# Patient Record
Sex: Female | Born: 1950 | Race: White | Hispanic: No | Marital: Married | Smoking: Former smoker
Health system: Southern US, Community
[De-identification: ages and names within clinical notes are randomized; demographics above are authoritative.]

## PROBLEM LIST (undated history)

## (undated) DIAGNOSIS — K219 Gastro-esophageal reflux disease without esophagitis: Secondary | ICD-10-CM

## (undated) DIAGNOSIS — G4730 Sleep apnea, unspecified: Secondary | ICD-10-CM

## (undated) DIAGNOSIS — Z87440 Personal history of urinary (tract) infections: Secondary | ICD-10-CM

## (undated) DIAGNOSIS — T148XXA Other injury of unspecified body region, initial encounter: Secondary | ICD-10-CM

## (undated) DIAGNOSIS — M1990 Unspecified osteoarthritis, unspecified site: Secondary | ICD-10-CM

## (undated) DIAGNOSIS — F419 Anxiety disorder, unspecified: Secondary | ICD-10-CM

## (undated) DIAGNOSIS — M179 Osteoarthritis of knee, unspecified: Secondary | ICD-10-CM

## (undated) DIAGNOSIS — E785 Hyperlipidemia, unspecified: Secondary | ICD-10-CM

## (undated) DIAGNOSIS — H269 Unspecified cataract: Secondary | ICD-10-CM

## (undated) DIAGNOSIS — K819 Cholecystitis, unspecified: Secondary | ICD-10-CM

## (undated) DIAGNOSIS — T7840XA Allergy, unspecified, initial encounter: Secondary | ICD-10-CM

## (undated) DIAGNOSIS — R3915 Urgency of urination: Secondary | ICD-10-CM

## (undated) DIAGNOSIS — J189 Pneumonia, unspecified organism: Secondary | ICD-10-CM

## (undated) DIAGNOSIS — R011 Cardiac murmur, unspecified: Secondary | ICD-10-CM

## (undated) DIAGNOSIS — E119 Type 2 diabetes mellitus without complications: Secondary | ICD-10-CM

## (undated) DIAGNOSIS — J439 Emphysema, unspecified: Secondary | ICD-10-CM

## (undated) DIAGNOSIS — R519 Headache, unspecified: Secondary | ICD-10-CM

## (undated) DIAGNOSIS — M1710 Unilateral primary osteoarthritis, unspecified knee: Secondary | ICD-10-CM

## (undated) DIAGNOSIS — IMO0001 Reserved for inherently not codable concepts without codable children: Secondary | ICD-10-CM

## (undated) DIAGNOSIS — R51 Headache: Secondary | ICD-10-CM

## (undated) DIAGNOSIS — J449 Chronic obstructive pulmonary disease, unspecified: Secondary | ICD-10-CM

## (undated) HISTORY — DX: Emphysema, unspecified: J43.9

## (undated) HISTORY — DX: Unspecified cataract: H26.9

## (undated) HISTORY — PX: ABDOMINAL HYSTERECTOMY: SHX81

## (undated) HISTORY — DX: Cardiac murmur, unspecified: R01.1

## (undated) HISTORY — PX: TONSILLECTOMY: SUR1361

## (undated) HISTORY — DX: Allergy, unspecified, initial encounter: T78.40XA

## (undated) HISTORY — PX: COLONOSCOPY: SHX174

## (undated) HISTORY — PX: CARPAL TUNNEL RELEASE: SHX101

## (undated) HISTORY — PX: KNEE ARTHROSCOPY: SUR90

## (undated) HISTORY — DX: Hyperlipidemia, unspecified: E78.5

---

## 2009-07-29 DIAGNOSIS — E1169 Type 2 diabetes mellitus with other specified complication: Secondary | ICD-10-CM | POA: Insufficient documentation

## 2012-05-04 DIAGNOSIS — K219 Gastro-esophageal reflux disease without esophagitis: Secondary | ICD-10-CM | POA: Insufficient documentation

## 2014-07-01 DIAGNOSIS — K819 Cholecystitis, unspecified: Secondary | ICD-10-CM

## 2014-07-01 HISTORY — DX: Cholecystitis, unspecified: K81.9

## 2014-07-18 ENCOUNTER — Emergency Department (HOSPITAL_COMMUNITY): Payer: Managed Care, Other (non HMO)

## 2014-07-18 ENCOUNTER — Observation Stay (HOSPITAL_COMMUNITY)
Admission: EM | Admit: 2014-07-18 | Discharge: 2014-07-22 | Disposition: A | Discharge status: Home / Self Care | Payer: Managed Care, Other (non HMO) | Attending: General Surgery | Admitting: General Surgery

## 2014-07-18 DIAGNOSIS — E119 Type 2 diabetes mellitus without complications: Secondary | ICD-10-CM | POA: Diagnosis not present

## 2014-07-18 DIAGNOSIS — R0902 Hypoxemia: Secondary | ICD-10-CM

## 2014-07-18 DIAGNOSIS — E785 Hyperlipidemia, unspecified: Secondary | ICD-10-CM | POA: Insufficient documentation

## 2014-07-18 DIAGNOSIS — Z883 Allergy status to other anti-infective agents status: Secondary | ICD-10-CM | POA: Insufficient documentation

## 2014-07-18 DIAGNOSIS — J439 Emphysema, unspecified: Secondary | ICD-10-CM | POA: Insufficient documentation

## 2014-07-18 DIAGNOSIS — K219 Gastro-esophageal reflux disease without esophagitis: Secondary | ICD-10-CM | POA: Insufficient documentation

## 2014-07-18 DIAGNOSIS — Z881 Allergy status to other antibiotic agents status: Secondary | ICD-10-CM | POA: Diagnosis not present

## 2014-07-18 DIAGNOSIS — Z7982 Long term (current) use of aspirin: Secondary | ICD-10-CM | POA: Insufficient documentation

## 2014-07-18 DIAGNOSIS — R17 Unspecified jaundice: Secondary | ICD-10-CM

## 2014-07-18 DIAGNOSIS — R079 Chest pain, unspecified: Secondary | ICD-10-CM | POA: Diagnosis present

## 2014-07-18 DIAGNOSIS — Z79899 Other long term (current) drug therapy: Secondary | ICD-10-CM | POA: Diagnosis not present

## 2014-07-18 DIAGNOSIS — K810 Acute cholecystitis: Secondary | ICD-10-CM | POA: Diagnosis present

## 2014-07-18 HISTORY — DX: Anxiety disorder, unspecified: F41.9

## 2014-07-18 HISTORY — DX: Type 2 diabetes mellitus without complications: E11.9

## 2014-07-18 HISTORY — DX: Reserved for inherently not codable concepts without codable children: IMO0001

## 2014-07-18 HISTORY — DX: Unspecified osteoarthritis, unspecified site: M19.90

## 2014-07-18 HISTORY — DX: Pneumonia, unspecified organism: J18.9

## 2014-07-18 HISTORY — DX: Personal history of urinary (tract) infections: Z87.440

## 2014-07-18 HISTORY — DX: Cholecystitis, unspecified: K81.9

## 2014-07-18 HISTORY — DX: Urgency of urination: R39.15

## 2014-07-18 HISTORY — DX: Osteoarthritis of knee, unspecified: M17.9

## 2014-07-18 HISTORY — DX: Sleep apnea, unspecified: G47.30

## 2014-07-18 HISTORY — DX: Other injury of unspecified body region, initial encounter: T14.8XXA

## 2014-07-18 HISTORY — DX: Headache: R51

## 2014-07-18 HISTORY — DX: Unilateral primary osteoarthritis, unspecified knee: M17.10

## 2014-07-18 HISTORY — DX: Headache, unspecified: R51.9

## 2014-07-18 HISTORY — DX: Chronic obstructive pulmonary disease, unspecified: J44.9

## 2014-07-18 HISTORY — DX: Gastro-esophageal reflux disease without esophagitis: K21.9

## 2014-07-18 LAB — COMPREHENSIVE METABOLIC PANEL
ALK PHOS: 88 U/L (ref 39–117)
ALT: 60 U/L — ABNORMAL HIGH (ref 0–35)
ANION GAP: 8 (ref 5–15)
AST: 91 U/L — ABNORMAL HIGH (ref 0–37)
Albumin: 3.7 g/dL (ref 3.5–5.2)
BUN: 14 mg/dL (ref 6–23)
CALCIUM: 9.4 mg/dL (ref 8.4–10.5)
CO2: 25 mmol/L (ref 19–32)
Chloride: 105 mmol/L (ref 96–112)
Creatinine, Ser: 0.87 mg/dL (ref 0.50–1.10)
GFR calc non Af Amer: 69 mL/min — ABNORMAL LOW (ref >90)
GFR, EST AFRICAN AMERICAN: 80 mL/min — AB (ref >90)
Glucose, Bld: 146 mg/dL — ABNORMAL HIGH (ref 70–99)
Potassium: 4 mmol/L (ref 3.5–5.1)
Sodium: 138 mmol/L (ref 135–145)
Total Bilirubin: 1.5 mg/dL — ABNORMAL HIGH (ref 0.3–1.2)
Total Protein: 6.6 g/dL (ref 6.0–8.3)

## 2014-07-18 LAB — CBC
HCT: 40 % (ref 36.0–46.0)
Hemoglobin: 13.6 g/dL (ref 12.0–15.0)
MCH: 31.3 pg (ref 26.0–34.0)
MCHC: 34 g/dL (ref 30.0–36.0)
MCV: 92 fL (ref 78.0–100.0)
Platelets: 233 10*3/uL (ref 150–400)
RBC: 4.35 MIL/uL (ref 3.87–5.11)
RDW: 13.7 % (ref 11.5–15.5)
WBC: 8.9 10*3/uL (ref 4.0–10.5)

## 2014-07-18 LAB — LIPASE, BLOOD: Lipase: 31 U/L (ref 11–59)

## 2014-07-18 LAB — I-STAT TROPONIN, ED: Troponin i, poc: 0 ng/mL (ref 0.00–0.08)

## 2014-07-18 MED ORDER — SODIUM CHLORIDE 0.9 % IV BOLUS (SEPSIS)
500.0000 mL | Freq: Once | INTRAVENOUS | Status: AC
  Administered 2014-07-18: 500 mL via INTRAVENOUS

## 2014-07-18 MED ORDER — SODIUM CHLORIDE 0.9 % IV BOLUS (SEPSIS): 1000.0000 mL | Freq: Once | INTRAVENOUS | Status: DC

## 2014-07-18 MED ORDER — FENTANYL CITRATE 0.05 MG/ML IJ SOLN
50.0000 ug | Freq: Once | INTRAMUSCULAR | Status: AC
  Administered 2014-07-18: 50 ug via INTRAVENOUS
  Filled 2014-07-18: qty 2

## 2014-07-18 MED ORDER — PROMETHAZINE HCL 25 MG/ML IJ SOLN
12.5000 mg | Freq: Once | INTRAMUSCULAR | Status: AC
  Administered 2014-07-18: 12.5 mg via INTRAVENOUS
  Filled 2014-07-18: qty 1

## 2014-07-18 MED ORDER — FENTANYL CITRATE 0.05 MG/ML IJ SOLN
100.0000 ug | Freq: Once | INTRAMUSCULAR | Status: AC
  Administered 2014-07-18: 100 ug via INTRAVENOUS
  Filled 2014-07-18: qty 2

## 2014-07-18 MED ORDER — ONDANSETRON HCL 4 MG/2ML IJ SOLN
4.0000 mg | Freq: Once | INTRAMUSCULAR | Status: AC
  Administered 2014-07-18: 4 mg via INTRAVENOUS
  Filled 2014-07-18: qty 2

## 2014-07-18 NOTE — ED Notes (Signed)
Per EMS: Pt from home for sudden onset of chest tightness, pt reports increased sob and also nausea. EMS noted emesis x2 en route, given 4mg  of zofran en route. Unremarkable EKG. NAD pt axox 4.

## 2014-07-18 NOTE — ED Provider Notes (Signed)
CSN: [id number]     Arrival date & time 07/18/14  1941 History   First MD Initiated Contact with Patient 07/18/14 1951     Chief Complaint  Patient presents with  . Chest Pain   Lindsay Brady is a 64 y.o. female with a history of diabetes, acid reflux, emphysema, and hyperlipidemia who presents to the emergency department complaining of epigastric and right upper quadrant abdominal pain associated with nausea, and vomiting. She also reports bilateral low chest tightness. She reports onset of pain at 6 pm tonight that is intermittent and associated with lots of nausea and vomiting. Her port she has vomited approximately 8 times a day. She denies any hematemesis. She rates her pain at 4-10 for that is worse on her right side and describes as tightness. Patient's previous abdominal surgeries include a total hysterectomy. The patient is not currently on a PPI. She has chronic lower extremity edema that is not worse today. The patient denies fevers, chills, shortness of breath, palpitations, diarrhea, hematemesis, hematochezia, rashes, lightheadedness or dizziness. She denies personal history of MI. She denies personal or family history of DVTs or PEs. Contrary to the nursing notes, she denies shortness of breath to me.  (Consider location/radiation/quality/duration/timing/severity/associated sxs/prior Treatment) HPI  No past medical history on file. No past surgical history on file. No family history on file. History  Substance Use Topics  . Smoking status: Not on file  . Smokeless tobacco: Not on file  . Alcohol Use: Not on file   OB History    No data available     Review of Systems  Constitutional: Negative for fever and chills.  HENT: Negative for congestion and sore throat.   Eyes: Negative for visual disturbance.  Respiratory: Positive for chest tightness. Negative for cough, shortness of breath and wheezing.   Cardiovascular: Positive for chest pain. Negative for palpitations.   Gastrointestinal: Positive for nausea and vomiting. Negative for abdominal pain, diarrhea and blood in stool.  Genitourinary: Negative for dysuria, hematuria and difficulty urinating.  Musculoskeletal: Negative for back pain and neck pain.  Skin: Negative for rash.  Neurological: Negative for weakness, light-headedness and headaches.      Allergies  Erythromycin and Bactrim  Home Medications   Prior to Admission medications   Medication Sig Start Date End Date Taking? Authorizing Provider  Aclidinium Bromide 400 MCG/ACT AEPB Inhale 1 puff into the lungs 2 (two) times daily.    Yes Historical Provider, MD  albuterol (PROVENTIL HFA;VENTOLIN HFA) 108 (90 BASE) MCG/ACT inhaler Inhale 2 puffs into the lungs every 6 (six) hours as needed for wheezing or shortness of breath.   Yes Historical Provider, MD  aspirin EC 81 MG tablet Take 81 mg by mouth at bedtime.   Yes Historical Provider, MD  ASTRAGALUS PO Take 1 tablet by mouth every evening.   Yes Historical Provider, MD  Cholecalciferol (VITAMIN D) 2000 UNITS CAPS Take 2,000 Units by mouth every evening.   Yes Historical Provider, MD  Coenzyme Q10 (CO Q 10 PO) Take 1 tablet by mouth 2 (two) times daily.    Yes Historical Provider, MD  conjugated estrogens (PREMARIN) vaginal cream Place 1 Applicatorful vaginally every other day.   Yes Historical Provider, MD  CRANBERRY PO Take 1 tablet by mouth daily as needed (only when taking HCTZ).   Yes Historical Provider, MD  escitalopram (LEXAPRO) 10 MG tablet Take 10 mg by mouth at bedtime.   Yes Historical Provider, MD  Glucosamine-Chondroitin (MOVE FREE PO) Take 1 tablet  by mouth every evening.   Yes Historical Provider, MD  Multiple Vitamins-Minerals (CENTRUM PO) Take 1 tablet by mouth 2 (two) times daily. Chewable tablet   Yes Historical Provider, MD  Omega-3 Fatty Acids (FISH OIL TRIPLE STRENGTH) 1400 MG CAPS Take 2 capsules by mouth every evening.   Yes Historical Provider, MD  simvastatin  (ZOCOR) 10 MG tablet Take 10 mg by mouth every evening.   Yes Historical Provider, MD  triamterene-hydrochlorothiazide (MAXZIDE-25) 37.5-25 MG per tablet Take 1 tablet by mouth daily as needed (fluid).   Yes Historical Provider, MD   BP 160/56 mmHg  Pulse 66  Temp(Src) 98.3 F (36.8 C) (Oral)  Resp 13  SpO2 100% Physical Exam  Constitutional: She is oriented to person, place, and time. She appears well-developed and well-nourished. No distress.  HENT:  Head: Normocephalic and atraumatic.  Right Ear: External ear normal.  Left Ear: External ear normal.  Mouth/Throat: Oropharynx is clear and moist. No oropharyngeal exudate.  Eyes: Conjunctivae are normal. Pupils are equal, round, and reactive to light. Right eye exhibits no discharge. Left eye exhibits no discharge.  Neck: Neck supple. No JVD present. No tracheal deviation present.  Cardiovascular: Normal rate, regular rhythm, normal heart sounds and intact distal pulses.  Exam reveals no gallop and no friction rub.   No murmur heard. Bilateral radial, posterior tibialis and dorsalis pedis pulses are intact.   Pulmonary/Chest: Effort normal and breath sounds normal. No respiratory distress. She has no wheezes. She has no rales.  Abdominal: Soft. Bowel sounds are normal. She exhibits no distension and no mass. There is tenderness. There is no rebound and no guarding.  Abdomen is soft. RUQ and epigastric tenderness to palpation. Positive Murphy's sign.  Bowel sounds are present. No McBurney's point tenderness. . Negative Rovsing sign.   Musculoskeletal: She exhibits edema.  Mild bilateral lower extremity edema.   Lymphadenopathy:    She has no cervical adenopathy.  Neurological: She is alert and oriented to person, place, and time. Coordination normal.  Skin: Skin is warm and dry. No rash noted. She is not diaphoretic. No erythema. No pallor.  Psychiatric: She has a normal mood and affect. Her behavior is normal.  Nursing note and vitals  reviewed.   ED Course  Procedures (including critical care time) Labs Review Labs Reviewed  COMPREHENSIVE METABOLIC PANEL - Abnormal; Notable for the following:    Glucose, Bld 146 (*)    AST 91 (*)    ALT 60 (*)    Total Bilirubin 1.5 (*)    GFR calc non Af Amer 69 (*)    GFR calc Af Amer 80 (*)    All other components within normal limits  CBC  LIPASE, BLOOD  COMPREHENSIVE METABOLIC PANEL  CBC  I-STAT TROPOININ, ED    Imaging Review Dg Chest 2 View  07/18/2014   CLINICAL DATA:  Per EMS: Pt from home for sudden onset of right side chest tightness, pt reports increased sob and also nausea. Non-Smoker.  EXAM: CHEST  2 VIEW  COMPARISON:  None.  FINDINGS: Cardiac silhouette normal in size and configuration. No mediastinal or hilar masses or convincing adenopathy.  Mild bilateral interstitial thickening. There are additional reticular opacities in the upper lobes mostly on the right. This may all be chronic reflecting scarring. Mild interstitial edema should be considered. There is no airspace consolidation to suggest pneumonia. There is no pleural effusion or pneumothorax.  Bony thorax is demineralized but grossly intact.  IMPRESSION: 1. Mild interstitial thickening  most prominent in the right upper lobe. This may all be chronic. Mild interstitial edema should be considered. 2. No evidence of pneumonia.   Electronically Signed   By: Amie Portland M.D.   On: 07/18/2014 20:39   US Abdomen Complete  07/18/2014   CLINICAL DATA:  Right upper quadrant pain  EXAM: ULTRASOUND ABDOMEN COMPLETE  COMPARISON:  None.  FINDINGS: Gallbladder: Cholelithiasis. Distended gallbladder. No mural thickening or pericholecystic fluid. The patient was not tender over the gallbladder.  Common bile duct: Diameter: There is abnormal dilatation of the extrahepatic bile ducts, 1.2 cm  Liver: No focal lesion identified. Within normal limits in parenchymal echogenicity.  IVC: No abnormality visualized.  Pancreas: Not well  seen.  Spleen: Size and appearance within normal limits.  Right Kidney: Length: 11.4 cm. Echogenicity within normal limits. No mass or hydronephrosis visualized.  Left Kidney: Length: 13.2 cm. Echogenicity within normal limits. No mass or hydronephrosis visualized.  Abdominal aorta: No aneurysm visualized.  Other findings: None.  IMPRESSION: Cholelithiasis. Dilated extrahepatic bile ducts and distended gallbladder. Cannot exclude choledocholithiasis. Poor visibility of the pancreas.   Electronically Signed   By: Ellery Plunk M.D.   On: 07/18/2014 23:09     EKG Interpretation   Date/Time:  Thursday July 18 2014 20:02:07 EST Ventricular Rate:  59 PR Interval:  150 QRS Duration: 185 QT Interval:  480 QTC Calculation: 475 R Axis:   92 Text Interpretation:  Sinus rhythm Right bundle branch block Baseline  wander in lead(s) V3 No old tracing to compare Confirmed by [facility]  MD,  ELLIOTT [phone]) on 07/18/2014 8:43:47 PM      Filed Vitals:   07/18/14 2059 07/18/14 2128 07/18/14 2331 07/19/14 0014  BP:  108/60 115/49 160/56  Pulse:  69 70 66  Temp: 98.3 F (36.8 C)     TempSrc: Oral     Resp:  24 12 13   SpO2:  100% 92% 100%     MDM   Meds given in ED:  Medications  enoxaparin (LOVENOX) injection 40 mg (not administered)  dextrose 5 %-0.9 % sodium chloride infusion (not administered)  ciprofloxacin (CIPRO) IVPB 400 mg (400 mg Intravenous New Bag/Given 07/19/14 0059)  HYDROmorphone (DILAUDID) injection 1 mg (1 mg Intravenous Given 07/19/14 0109)  ondansetron (ZOFRAN) injection 4 mg (4 mg Intravenous Given 07/19/14 0109)  acetaminophen (TYLENOL) tablet 650 mg (not administered)    Or  acetaminophen (TYLENOL) suppository 650 mg (not administered)  fentaNYL (SUBLIMAZE) injection 50 mcg (50 mcg Intravenous Given 07/18/14 2048)  ondansetron (ZOFRAN) injection 4 mg (4 mg Intravenous Given 07/18/14 2048)  sodium chloride 0.9 % bolus 500 mL (0 mLs Intravenous Stopped 07/18/14 2140)  sodium  chloride 0.9 % bolus 500 mL (0 mLs Intravenous Stopped 07/19/14 0103)  promethazine (PHENERGAN) injection 12.5 mg (12.5 mg Intravenous Given 07/18/14 2207)  fentaNYL (SUBLIMAZE) injection 100 mcg (100 mcg Intravenous Given 07/18/14 2222)    New Prescriptions   No medications on file    Final diagnoses:  Acute cholecystitis   This is a 64 y.o. female with a history of diabetes, acid reflux, emphysema, and hyperlipidemia who presents to the emergency department complaining of epigastric and right upper quadrant abdominal pain associated with nausea, and vomiting.  Patient is afebrile and nontoxic appearing. Positive Murphy's sign. Troponin is negative. Elevated liver enzymes with an AST of 91 and ALT 60. Elevated bilirubin at 1.5. Lipase is normal at 31. CBC is unremarkable with a white count of 8.9. Abdominal ultrasound indicates cholelithiasis  with dilated extrahepatic bile ducts and distended gallbladder. Common bile duct size is 1.2 cm. She had some improvement with pain and nausea with fentanyl and phenergan. She is still complaining of pain and some slight nausea. [facility] The patient is in agreement with admission.   This patient was discussed with Dr. Effie ShyWentz who agrees with assessment and plan.      Lawana ChambersWilliam Duncan Deberah Adolf, PA-C 07/19/14 [id number]  Flint MelterElliott L Wentz, MD 07/19/14 [id number]

## 2014-07-19 ENCOUNTER — Encounter (HOSPITAL_COMMUNITY): Payer: Self-pay | Admitting: General Practice

## 2014-07-19 ENCOUNTER — Encounter (HOSPITAL_COMMUNITY)
Admission: EM | Disposition: A | Discharge status: Home / Self Care | Payer: Self-pay | Source: Home / Self Care | Attending: Emergency Medicine

## 2014-07-19 ENCOUNTER — Observation Stay (HOSPITAL_COMMUNITY): Payer: Managed Care, Other (non HMO) | Admitting: Certified Registered Nurse Anesthetist

## 2014-07-19 ENCOUNTER — Observation Stay (HOSPITAL_COMMUNITY): Payer: Managed Care, Other (non HMO)

## 2014-07-19 DIAGNOSIS — K810 Acute cholecystitis: Secondary | ICD-10-CM | POA: Diagnosis present

## 2014-07-19 HISTORY — PX: CHOLECYSTECTOMY: SHX55

## 2014-07-19 LAB — COMPREHENSIVE METABOLIC PANEL
ALK PHOS: 102 U/L (ref 39–117)
ALT: 189 U/L — ABNORMAL HIGH (ref 0–35)
AST: 256 U/L — AB (ref 0–37)
Albumin: 3.7 g/dL (ref 3.5–5.2)
Anion gap: 4 — ABNORMAL LOW (ref 5–15)
BUN: 13 mg/dL (ref 6–23)
CHLORIDE: 106 mmol/L (ref 96–112)
CO2: 27 mmol/L (ref 19–32)
CREATININE: 0.8 mg/dL (ref 0.50–1.10)
Calcium: 8.8 mg/dL (ref 8.4–10.5)
GFR calc Af Amer: 89 mL/min — ABNORMAL LOW (ref >90)
GFR calc non Af Amer: 77 mL/min — ABNORMAL LOW (ref >90)
Glucose, Bld: 180 mg/dL — ABNORMAL HIGH (ref 70–99)
POTASSIUM: 4.4 mmol/L (ref 3.5–5.1)
SODIUM: 137 mmol/L (ref 135–145)
Total Bilirubin: 2.3 mg/dL — ABNORMAL HIGH (ref 0.3–1.2)
Total Protein: 6.5 g/dL (ref 6.0–8.3)

## 2014-07-19 LAB — GLUCOSE, CAPILLARY: Glucose-Capillary: 138 mg/dL — ABNORMAL HIGH (ref 70–99)

## 2014-07-19 LAB — CBC
HCT: 40 % (ref 36.0–46.0)
Hemoglobin: 13.5 g/dL (ref 12.0–15.0)
MCH: 31.3 pg (ref 26.0–34.0)
MCHC: 33.8 g/dL (ref 30.0–36.0)
MCV: 92.8 fL (ref 78.0–100.0)
PLATELETS: 216 10*3/uL (ref 150–400)
RBC: 4.31 MIL/uL (ref 3.87–5.11)
RDW: 13.8 % (ref 11.5–15.5)
WBC: 9.5 10*3/uL (ref 4.0–10.5)

## 2014-07-19 LAB — SURGICAL PCR SCREEN
MRSA, PCR: NEGATIVE
Staphylococcus aureus: NEGATIVE

## 2014-07-19 SURGERY — LAPAROSCOPIC CHOLECYSTECTOMY WITH INTRAOPERATIVE CHOLANGIOGRAM
Anesthesia: General | Site: Abdomen

## 2014-07-19 MED ORDER — CIPROFLOXACIN IN D5W 400 MG/200ML IV SOLN
400.0000 mg | Freq: Two times a day (BID) | INTRAVENOUS | Status: DC
  Administered 2014-07-19 (×2): 400 mg via INTRAVENOUS
  Filled 2014-07-19 (×3): qty 200

## 2014-07-19 MED ORDER — SODIUM CHLORIDE 0.9 % IV SOLN
INTRAVENOUS | Status: DC | PRN
  Administered 2014-07-19: 15 mL

## 2014-07-19 MED ORDER — LIDOCAINE HCL 4 % MT SOLN
OROMUCOSAL | Status: DC | PRN
  Administered 2014-07-19: 4 mL via TOPICAL

## 2014-07-19 MED ORDER — DEXTROSE-NACL 5-0.9 % IV SOLN
INTRAVENOUS | Status: DC
  Administered 2014-07-19 – 2014-07-20 (×2): via INTRAVENOUS

## 2014-07-19 MED ORDER — MIDAZOLAM HCL 2 MG/2ML IJ SOLN
INTRAMUSCULAR | Status: AC
  Filled 2014-07-19: qty 2

## 2014-07-19 MED ORDER — ENOXAPARIN SODIUM 40 MG/0.4ML ~~LOC~~ SOLN
40.0000 mg | SUBCUTANEOUS | Status: DC
  Administered 2014-07-20 – 2014-07-21 (×2): 40 mg via SUBCUTANEOUS
  Filled 2014-07-19 (×2): qty 0.4

## 2014-07-19 MED ORDER — BUPIVACAINE-EPINEPHRINE (PF) 0.25% -1:200000 IJ SOLN
INTRAMUSCULAR | Status: AC
  Filled 2014-07-19: qty 30

## 2014-07-19 MED ORDER — DEXAMETHASONE SODIUM PHOSPHATE 4 MG/ML IJ SOLN
INTRAMUSCULAR | Status: AC
  Filled 2014-07-19: qty 1

## 2014-07-19 MED ORDER — OXYCODONE-ACETAMINOPHEN 5-325 MG PO TABS
1.0000 | ORAL_TABLET | ORAL | Status: DC | PRN
  Administered 2014-07-20 – 2014-07-21 (×4): 2 via ORAL
  Filled 2014-07-19 (×4): qty 2

## 2014-07-19 MED ORDER — ROCURONIUM BROMIDE 50 MG/5ML IV SOLN
INTRAVENOUS | Status: AC
  Filled 2014-07-19: qty 1

## 2014-07-19 MED ORDER — GLYCOPYRROLATE 0.2 MG/ML IJ SOLN
INTRAMUSCULAR | Status: AC
  Filled 2014-07-19: qty 2

## 2014-07-19 MED ORDER — PHENYLEPHRINE HCL 10 MG/ML IJ SOLN
INTRAMUSCULAR | Status: DC | PRN
Start: 2014-07-19 — End: 2014-07-19
  Administered 2014-07-19 (×3): 80 ug via INTRAVENOUS

## 2014-07-19 MED ORDER — BUPIVACAINE-EPINEPHRINE 0.25% -1:200000 IJ SOLN
INTRAMUSCULAR | Status: DC | PRN
  Administered 2014-07-19: 15 mL

## 2014-07-19 MED ORDER — 0.9 % SODIUM CHLORIDE (POUR BTL) OPTIME
TOPICAL | Status: DC | PRN
  Administered 2014-07-19: 1000 mL

## 2014-07-19 MED ORDER — FENTANYL CITRATE 0.05 MG/ML IJ SOLN
INTRAMUSCULAR | Status: AC
  Filled 2014-07-19: qty 2

## 2014-07-19 MED ORDER — ACETAMINOPHEN 325 MG PO TABS: 325.0000 mg | ORAL_TABLET | ORAL | Status: DC | PRN

## 2014-07-19 MED ORDER — EPHEDRINE SULFATE 50 MG/ML IJ SOLN
INTRAMUSCULAR | Status: DC | PRN
  Administered 2014-07-19: 5 mg via INTRAVENOUS

## 2014-07-19 MED ORDER — LIDOCAINE HCL (CARDIAC) 20 MG/ML IV SOLN
INTRAVENOUS | Status: DC | PRN
  Administered 2014-07-19: 80 mg via INTRAVENOUS

## 2014-07-19 MED ORDER — ONDANSETRON HCL 4 MG/2ML IJ SOLN
INTRAMUSCULAR | Status: DC | PRN
  Administered 2014-07-19: 4 mg via INTRAVENOUS

## 2014-07-19 MED ORDER — ONDANSETRON HCL 4 MG/2ML IJ SOLN
4.0000 mg | Freq: Four times a day (QID) | INTRAMUSCULAR | Status: DC | PRN
  Administered 2014-07-19: 4 mg via INTRAVENOUS
  Filled 2014-07-19: qty 2

## 2014-07-19 MED ORDER — ENOXAPARIN SODIUM 40 MG/0.4ML ~~LOC~~ SOLN: 40.0000 mg | SUBCUTANEOUS | Status: DC

## 2014-07-19 MED ORDER — GLYCOPYRROLATE 0.2 MG/ML IJ SOLN
INTRAMUSCULAR | Status: DC | PRN
  Administered 2014-07-19: .1 mg via INTRAVENOUS
  Administered 2014-07-19: .4 mg via INTRAVENOUS

## 2014-07-19 MED ORDER — NEOSTIGMINE METHYLSULFATE 10 MG/10ML IV SOLN
INTRAVENOUS | Status: DC | PRN
  Administered 2014-07-19: 3 mg via INTRAVENOUS
  Administered 2014-07-19: 1 mg via INTRAVENOUS

## 2014-07-19 MED ORDER — PROPOFOL 10 MG/ML IV BOLUS
INTRAVENOUS | Status: AC
  Filled 2014-07-19: qty 20

## 2014-07-19 MED ORDER — ALBUTEROL SULFATE HFA 108 (90 BASE) MCG/ACT IN AERS
INHALATION_SPRAY | RESPIRATORY_TRACT | Status: DC | PRN
  Administered 2014-07-19: 8 via RESPIRATORY_TRACT

## 2014-07-19 MED ORDER — PHENYLEPHRINE 40 MCG/ML (10ML) SYRINGE FOR IV PUSH (FOR BLOOD PRESSURE SUPPORT)
PREFILLED_SYRINGE | INTRAVENOUS | Status: AC
  Filled 2014-07-19: qty 20

## 2014-07-19 MED ORDER — ACETAMINOPHEN 650 MG RE SUPP
650.0000 mg | Freq: Four times a day (QID) | RECTAL | Status: DC | PRN
Start: 2014-07-19 — End: 2014-07-22

## 2014-07-19 MED ORDER — LACTATED RINGERS IV SOLN
INTRAVENOUS | Status: DC
  Administered 2014-07-19: 15:00:00 via INTRAVENOUS

## 2014-07-19 MED ORDER — ACETAMINOPHEN 325 MG PO TABS: 650.0000 mg | ORAL_TABLET | Freq: Four times a day (QID) | ORAL | Status: DC | PRN

## 2014-07-19 MED ORDER — PROPOFOL 10 MG/ML IV BOLUS
INTRAVENOUS | Status: DC | PRN
  Administered 2014-07-19: 160 mg via INTRAVENOUS
  Administered 2014-07-19: 20 mg via INTRAVENOUS

## 2014-07-19 MED ORDER — OXYCODONE HCL 5 MG PO TABS: 5.0000 mg | ORAL_TABLET | Freq: Once | ORAL | Status: DC | PRN

## 2014-07-19 MED ORDER — FENTANYL CITRATE 0.05 MG/ML IJ SOLN
25.0000 ug | INTRAMUSCULAR | Status: DC | PRN
  Administered 2014-07-19 (×2): 50 ug via INTRAVENOUS

## 2014-07-19 MED ORDER — FENTANYL CITRATE 0.05 MG/ML IJ SOLN
INTRAMUSCULAR | Status: AC
  Filled 2014-07-19: qty 5

## 2014-07-19 MED ORDER — SODIUM CHLORIDE 0.9 % IR SOLN
Status: DC | PRN
  Administered 2014-07-19 (×2): 1000 mL

## 2014-07-19 MED ORDER — HYDROMORPHONE HCL 1 MG/ML IJ SOLN
1.0000 mg | INTRAMUSCULAR | Status: DC | PRN
  Administered 2014-07-19 – 2014-07-20 (×2): 1 mg via INTRAVENOUS
  Filled 2014-07-19 (×2): qty 1

## 2014-07-19 MED ORDER — ACETAMINOPHEN 160 MG/5ML PO SOLN
325.0000 mg | ORAL | Status: DC | PRN
  Filled 2014-07-19: qty 20.3

## 2014-07-19 MED ORDER — SUCCINYLCHOLINE CHLORIDE 20 MG/ML IJ SOLN
INTRAMUSCULAR | Status: DC | PRN
  Administered 2014-07-19: 60 mg via INTRAVENOUS

## 2014-07-19 MED ORDER — MIDAZOLAM HCL 2 MG/2ML IJ SOLN
2.0000 mg | Freq: Once | INTRAMUSCULAR | Status: AC
  Administered 2014-07-19: 2 mg via INTRAVENOUS

## 2014-07-19 MED ORDER — LACTATED RINGERS IV SOLN
INTRAVENOUS | Status: DC | PRN
  Administered 2014-07-19 (×2): via INTRAVENOUS

## 2014-07-19 MED ORDER — FENTANYL CITRATE 0.05 MG/ML IJ SOLN
INTRAMUSCULAR | Status: DC | PRN
  Administered 2014-07-19 (×3): 25 ug via INTRAVENOUS
  Administered 2014-07-19: 150 ug via INTRAVENOUS
  Administered 2014-07-19 (×3): 25 ug via INTRAVENOUS

## 2014-07-19 MED ORDER — LIDOCAINE HCL (CARDIAC) 20 MG/ML IV SOLN
INTRAVENOUS | Status: AC
  Filled 2014-07-19: qty 10

## 2014-07-19 MED ORDER — DEXAMETHASONE SODIUM PHOSPHATE 4 MG/ML IJ SOLN
INTRAMUSCULAR | Status: DC | PRN
  Administered 2014-07-19: 4 mg via INTRAVENOUS

## 2014-07-19 MED ORDER — MIDAZOLAM HCL 5 MG/5ML IJ SOLN
INTRAMUSCULAR | Status: DC | PRN
  Administered 2014-07-19: 2 mg via INTRAVENOUS

## 2014-07-19 MED ORDER — OXYCODONE HCL 5 MG/5ML PO SOLN: 5.0000 mg | Freq: Once | ORAL | Status: DC | PRN

## 2014-07-19 MED ORDER — ROCURONIUM BROMIDE 100 MG/10ML IV SOLN
INTRAVENOUS | Status: DC | PRN
Start: 2014-07-19 — End: 2014-07-19
  Administered 2014-07-19: 30 mg via INTRAVENOUS

## 2014-07-19 SURGICAL SUPPLY — 41 items
APPLIER CLIP 5 13 M/L LIGAMAX5 (MISCELLANEOUS) ×2
CANISTER SUCTION 2500CC (MISCELLANEOUS) ×2 IMPLANT
CHLORAPREP W/TINT 26ML (MISCELLANEOUS) ×2 IMPLANT
CLIP APPLIE 5 13 M/L LIGAMAX5 (MISCELLANEOUS) ×1 IMPLANT
COVER MAYO STAND STRL (DRAPES) ×2 IMPLANT
COVER SURGICAL LIGHT HANDLE (MISCELLANEOUS) ×2 IMPLANT
DRAPE C-ARM 42X72 X-RAY (DRAPES) ×2 IMPLANT
DRAPE LAPAROSCOPIC ABDOMINAL (DRAPES) ×2 IMPLANT
ELECT REM PT RETURN 9FT ADLT (ELECTROSURGICAL) ×2
ELECTRODE REM PT RTRN 9FT ADLT (ELECTROSURGICAL) ×1 IMPLANT
FILTER SMOKE EVAC LAPAROSHD (FILTER) IMPLANT
GLOVE BIO SURGEON STRL SZ7.5 (GLOVE) ×2 IMPLANT
GLOVE BIO SURGEON STRL SZ8 (GLOVE) ×2 IMPLANT
GLOVE BIOGEL PI IND STRL 7.5 (GLOVE) ×2 IMPLANT
GLOVE BIOGEL PI IND STRL 8 (GLOVE) ×1 IMPLANT
GLOVE BIOGEL PI INDICATOR 7.5 (GLOVE) ×2
GLOVE BIOGEL PI INDICATOR 8 (GLOVE) ×1
GOWN STRL REUS W/ TWL LRG LVL3 (GOWN DISPOSABLE) ×2 IMPLANT
GOWN STRL REUS W/ TWL XL LVL3 (GOWN DISPOSABLE) ×1 IMPLANT
GOWN STRL REUS W/TWL LRG LVL3 (GOWN DISPOSABLE) ×2
GOWN STRL REUS W/TWL XL LVL3 (GOWN DISPOSABLE) ×1
KIT BASIN OR (CUSTOM PROCEDURE TRAY) ×2 IMPLANT
KIT ROOM TURNOVER OR (KITS) ×2 IMPLANT
LIQUID BAND (GAUZE/BANDAGES/DRESSINGS) ×2 IMPLANT
NS IRRIG 1000ML POUR BTL (IV SOLUTION) ×2 IMPLANT
PAD ARMBOARD 7.5X6 YLW CONV (MISCELLANEOUS) ×2 IMPLANT
POUCH RETRIEVAL ECOSAC 10 (ENDOMECHANICALS) ×1 IMPLANT
POUCH RETRIEVAL ECOSAC 10MM (ENDOMECHANICALS) ×1
SCISSORS LAP 5X35 DISP (ENDOMECHANICALS) ×2 IMPLANT
SET CHOLANGIOGRAPH 5 50 .035 (SET/KITS/TRAYS/PACK) ×2 IMPLANT
SET IRRIG TUBING LAPAROSCOPIC (IRRIGATION / IRRIGATOR) ×2 IMPLANT
SLEEVE ENDOPATH XCEL 5M (ENDOMECHANICALS) ×4 IMPLANT
SPECIMEN JAR SMALL (MISCELLANEOUS) ×2 IMPLANT
SUT VIC AB 4-0 PS2 27 (SUTURE) ×2 IMPLANT
SUT VICRYL 0 UR6 27IN ABS (SUTURE) ×4 IMPLANT
TOWEL OR 17X24 6PK STRL BLUE (TOWEL DISPOSABLE) ×2 IMPLANT
TOWEL OR 17X26 10 PK STRL BLUE (TOWEL DISPOSABLE) ×2 IMPLANT
TRAY LAPAROSCOPIC (CUSTOM PROCEDURE TRAY) ×2 IMPLANT
TROCAR XCEL BLUNT TIP 100MML (ENDOMECHANICALS) ×2 IMPLANT
TROCAR XCEL NON-BLD 5MMX100MML (ENDOMECHANICALS) ×2 IMPLANT
TUBING INSUFFLATION (TUBING) ×2 IMPLANT

## 2014-07-19 NOTE — Transfer of Care (Signed)
Immediate Anesthesia Transfer of Care Note  Patient: Lindsay Brady  Procedure(s) Performed: Procedure(s): LAPAROSCOPIC CHOLECYSTECTOMY WITH INTRAOPERATIVE CHOLANGIOGRAM (N/A)  Patient Location: PACU  Anesthesia Type:General  Level of Consciousness: awake, alert  and oriented  Airway & Oxygen Therapy: Patient Spontanous Breathing and Patient connected to nasal cannula oxygen  Post-op Assessment: Report given to RN and Post -op Vital signs reviewed and stable  Post vital signs: Reviewed and stable  Last Vitals:  Filed Vitals:   07/19/14 1246  BP: 95/40  Pulse: 87  Temp: 38.1 C  Resp: 18    Complications: No apparent anesthesia complications

## 2014-07-19 NOTE — Anesthesia Preprocedure Evaluation (Addendum)
Anesthesia Evaluation  Patient identified by MRN, date of birth, ID band Patient awake    Reviewed: Allergy & Precautions, NPO status , Patient's Chart, lab work & pertinent test results  History of Anesthesia Complications Negative for: history of anesthetic complications  Airway Mallampati: II  TM Distance: >3 FB Neck ROM: Full    Dental  (+) Teeth Intact, Dental Advisory Given   Pulmonary shortness of breath, sleep apnea , COPD COPD inhaler, former smoker,  breath sounds clear to auscultation        Cardiovascular negative cardio ROS  Rhythm:Regular     Neuro/Psych  Headaches, Anxiety    GI/Hepatic Neg liver ROS, GERD-  Medicated and Controlled,  Endo/Other  diabetes, Type 2, Oral Hypoglycemic Agents  Renal/GU negative Renal ROS     Musculoskeletal  (+) Arthritis -,   Abdominal   Peds  Hematology negative hematology ROS (+)   Anesthesia Other Findings   Reproductive/Obstetrics                            Anesthesia Physical Anesthesia Plan  ASA: III  Anesthesia Plan: General   Post-op Pain Management:    Induction: Intravenous  Airway Management Planned: Oral ETT  Additional Equipment: None  Intra-op Plan:   Post-operative Plan: Extubation in OR  Informed Consent: I have reviewed the patients History and Physical, chart, labs and discussed the procedure including the risks, benefits and alternatives for the proposed anesthesia with the patient or authorized representative who has indicated his/her understanding and acceptance.   Dental advisory given  Plan Discussed with: CRNA, Anesthesiologist and Surgeon  Anesthesia Plan Comments:        Anesthesia Quick Evaluation

## 2014-07-19 NOTE — Progress Notes (Signed)
Patient ID: Lindsay Brady, female   DOB: [birth year], 64 y.o.   MRN: [id number]    Subjective: Pt doing well this morning.  Pain improved  Objective: Vital signs in last 24 hours: Temp:  [98.3 F (36.8 C)-99 F (37.2 C)] 99 F (37.2 C) (02/19 0405) Pulse Rate:  [62-88] 88 (02/19 0405) Resp:  [12-31] 24 (02/19 0405) BP: (98-160)/(49-78) 127/61 mmHg (02/19 0405) SpO2:  [89 %-100 %] 98 % (02/19 0405) Weight:  [283 lb 15.2 oz (128.8 kg)] 283 lb 15.2 oz (128.8 kg) (02/19 0405) Last BM Date: 07/18/14  Intake/Output from previous day:   Intake/Output this shift:    PE: Abd: soft, tender in RUQ, +BS, obese Heart: regular Lungs: CTAB  Lab Results:   Recent Labs  07/18/14 1950 07/19/14 0237  WBC 8.9 9.5  HGB 13.6 13.5  HCT 40.0 40.0  PLT 233 216   BMET  Recent Labs  07/18/14 2035 07/19/14 0237  NA 138 137  K 4.0 4.4  CL 105 106  CO2 25 27  GLUCOSE 146* 180*  BUN 14 13  CREATININE 0.87 0.80  CALCIUM 9.4 8.8   PT/INR No results for input(s): LABPROT, INR in the last 72 hours. CMP     Component Value Date/Time   NA 137 07/19/2014 0237   K 4.4 07/19/2014 0237   CL 106 07/19/2014 0237   CO2 27 07/19/2014 0237   GLUCOSE 180* 07/19/2014 0237   BUN 13 07/19/2014 0237   CREATININE 0.80 07/19/2014 0237   CALCIUM 8.8 07/19/2014 0237   PROT 6.5 07/19/2014 0237   ALBUMIN 3.7 07/19/2014 0237   AST 256* 07/19/2014 0237   ALT 189* 07/19/2014 0237   ALKPHOS 102 07/19/2014 0237   BILITOT 2.3* 07/19/2014 0237   GFRNONAA 77* 07/19/2014 0237   GFRAA 89* 07/19/2014 0237   Lipase     Component Value Date/Time   LIPASE 31 07/18/2014 2035       Studies/Results: Dg Chest 2 View  07/18/2014   CLINICAL DATA:  Per EMS: Pt from home for sudden onset of right side chest tightness, pt reports increased sob and also nausea. Non-Smoker.  EXAM: CHEST  2 VIEW  COMPARISON:  None.  FINDINGS: Cardiac silhouette normal in size and configuration. No mediastinal or hilar masses or  convincing adenopathy.  Mild bilateral interstitial thickening. There are additional reticular opacities in the upper lobes mostly on the right. This may all be chronic reflecting scarring. Mild interstitial edema should be considered. There is no airspace consolidation to suggest pneumonia. There is no pleural effusion or pneumothorax.  Bony thorax is demineralized but grossly intact.  IMPRESSION: 1. Mild interstitial thickening most prominent in the right upper lobe. This may all be chronic. Mild interstitial edema should be considered. 2. No evidence of pneumonia.   Electronically Signed   By: Amie Portland M.D.   On: 07/18/2014 20:39   US Abdomen Complete  07/18/2014   CLINICAL DATA:  Right upper quadrant pain  EXAM: ULTRASOUND ABDOMEN COMPLETE  COMPARISON:  None.  FINDINGS: Gallbladder: Cholelithiasis. Distended gallbladder. No mural thickening or pericholecystic fluid. The patient was not tender over the gallbladder.  Common bile duct: Diameter: There is abnormal dilatation of the extrahepatic bile ducts, 1.2 cm  Liver: No focal lesion identified. Within normal limits in parenchymal echogenicity.  IVC: No abnormality visualized.  Pancreas: Not well seen.  Spleen: Size and appearance within normal limits.  Right Kidney: Length: 11.4 cm. Echogenicity within normal limits. No mass or hydronephrosis visualized.  Left Kidney: Length: 13.2 cm. Echogenicity within normal limits. No mass or hydronephrosis visualized.  Abdominal aorta: No aneurysm visualized.  Other findings: None.  IMPRESSION: Cholelithiasis. Dilated extrahepatic bile ducts and distended gallbladder. Cannot exclude choledocholithiasis. Poor visibility of the pancreas.   Electronically Signed   By: Ellery Plunkaniel R Mitchell M.D.   On: 07/18/2014 23:09    Anti-infectives: Anti-infectives    Start     Dose/Rate Route Frequency Ordered Stop   07/19/14 0030  ciprofloxacin (CIPRO) IVPB 400 mg     400 mg 200 mL/hr over 60 Minutes Intravenous Every 12  hours 07/19/14 0025         Assessment/Plan  1. Acute cholecystitis  2. Elevated LFTs, possible choledocholithiasis  Plan: 1. LFTs increased today.  Will still proceed with lap chole today with IOC.  If positive will call GI for ERCP.  Patient understands this and is agreeable to proceed.     Caedan Sumler E 07/19/2014, 8:58 AM Pager: [phone]

## 2014-07-19 NOTE — H&P (Signed)
Lindsay Brady is an 64 y.o. female.   Chief Complaint: RUQ abdominal pain HPI: pt presents to ED with sharp severe RUQ abdominal pain after eating at 5 pm. Pain is sharp,  Constant and located in RUQ.  Radiates into lower chest.  US shows gallstones distended GB and 1CM CBD. Pt having sever NV.   No past medical history on file.  No past surgical history on file.  No family history on file. Social History:  has no tobacco, alcohol, and drug history on file.  Allergies:  Allergies  Allergen Reactions  . Erythromycin Other (See Comments)    Pain in stomach   . Bactrim [Sulfamethoxazole-Trimethoprim] Rash     (Not in a hospital admission)  Results for orders placed or performed during the hospital encounter of 07/18/14 (from the past 48 hour(s))  CBC     Status: None   Collection Time: 07/18/14  7:50 PM  Result Value Ref Range   WBC 8.9 4.0 - 10.5 K/uL   RBC 4.35 3.87 - 5.11 MIL/uL   Hemoglobin 13.6 12.0 - 15.0 g/dL   HCT 40.0 36.0 - 46.0 %   MCV 92.0 78.0 - 100.0 fL   MCH 31.3 26.0 - 34.0 pg   MCHC 34.0 30.0 - 36.0 g/dL   RDW 13.7 11.5 - 15.5 %   Platelets 233 150 - 400 K/uL  Comprehensive metabolic panel     Status: Abnormal   Collection Time: 07/18/14  8:35 PM  Result Value Ref Range   Sodium 138 135 - 145 mmol/L   Potassium 4.0 3.5 - 5.1 mmol/L   Chloride 105 96 - 112 mmol/L   CO2 25 19 - 32 mmol/L   Glucose, Bld 146 (H) 70 - 99 mg/dL   BUN 14 6 - 23 mg/dL   Creatinine, Ser 0.87 0.50 - 1.10 mg/dL   Calcium 9.4 8.4 - 10.5 mg/dL   Total Protein 6.6 6.0 - 8.3 g/dL   Albumin 3.7 3.5 - 5.2 g/dL   AST 91 (H) 0 - 37 U/L   ALT 60 (H) 0 - 35 U/L   Alkaline Phosphatase 88 39 - 117 U/L   Total Bilirubin 1.5 (H) 0.3 - 1.2 mg/dL   GFR calc non Af Amer 69 (L) >90 mL/min   GFR calc Af Amer 80 (L) >90 mL/min    Comment: (NOTE) The eGFR has been calculated using the CKD EPI equation. This calculation has not been validated in all clinical situations. eGFR's persistently <90  mL/min signify possible Chronic Kidney Disease.    Anion gap 8 5 - 15  Lipase, blood     Status: None   Collection Time: 07/18/14  8:35 PM  Result Value Ref Range   Lipase 31 11 - 59 U/L  I-stat troponin, ED (not at [facility])     Status: None   Collection Time: 07/18/14  8:36 PM  Result Value Ref Range   Troponin i, poc 0.00 0.00 - 0.08 ng/mL   Comment 3            Comment: Due to the release kinetics of cTnI, a negative result within the first hours of the onset of symptoms does not rule out myocardial infarction with certainty. If myocardial infarction is still suspected, repeat the test at appropriate intervals.    Dg Chest 2 View  07/18/2014   CLINICAL DATA:  Per EMS: Pt from home for sudden onset of right side chest tightness, pt reports increased sob and also nausea. Non-Smoker.  EXAM: CHEST  2 VIEW  COMPARISON:  None.  FINDINGS: Cardiac silhouette normal in size and configuration. No mediastinal or hilar masses or convincing adenopathy.  Mild bilateral interstitial thickening. There are additional reticular opacities in the upper lobes mostly on the right. This may all be chronic reflecting scarring. Mild interstitial edema should be considered. There is no airspace consolidation to suggest pneumonia. There is no pleural effusion or pneumothorax.  Bony thorax is demineralized but grossly intact.  IMPRESSION: 1. Mild interstitial thickening most prominent in the right upper lobe. This may all be chronic. Mild interstitial edema should be considered. 2. No evidence of pneumonia.   Electronically Signed   By: Lajean Manes M.D.   On: 07/18/2014 20:39   US Abdomen Complete  07/18/2014   CLINICAL DATA:  Right upper quadrant pain  EXAM: ULTRASOUND ABDOMEN COMPLETE  COMPARISON:  None.  FINDINGS: Gallbladder: Cholelithiasis. Distended gallbladder. No mural thickening or pericholecystic fluid. The patient was not tender over the gallbladder.  Common bile duct: Diameter: There is abnormal dilatation  of the extrahepatic bile ducts, 1.2 cm  Liver: No focal lesion identified. Within normal limits in parenchymal echogenicity.  IVC: No abnormality visualized.  Pancreas: Not well seen.  Spleen: Size and appearance within normal limits.  Right Kidney: Length: 11.4 cm. Echogenicity within normal limits. No mass or hydronephrosis visualized.  Left Kidney: Length: 13.2 cm. Echogenicity within normal limits. No mass or hydronephrosis visualized.  Abdominal aorta: No aneurysm visualized.  Other findings: None.  IMPRESSION: Cholelithiasis. Dilated extrahepatic bile ducts and distended gallbladder. Cannot exclude choledocholithiasis. Poor visibility of the pancreas.   Electronically Signed   By: Andreas Newport M.D.   On: 07/18/2014 23:09    Review of Systems  Constitutional: Negative for fever and chills.  HENT: Negative.   Eyes: Negative.   Respiratory: Negative.   Cardiovascular: Negative.   Gastrointestinal: Positive for nausea, vomiting and abdominal pain.  Genitourinary: Negative.   Musculoskeletal: Negative.   Skin: Negative.   Neurological: Negative.   Endo/Heme/Allergies: Negative.   Psychiatric/Behavioral: Negative.     Blood pressure 160/56, pulse 66, temperature 98.3 F (36.8 C), temperature source Oral, resp. rate 13, SpO2 100 %. Physical Exam  Constitutional: She is oriented to person, place, and time. She appears well-developed.  Eyes: Pupils are equal, round, and reactive to light. No scleral icterus.  Neck: Normal range of motion. Neck supple.  Cardiovascular: Normal rate and regular rhythm.   Respiratory: Effort normal and breath sounds normal.  GI: There is tenderness in the right upper quadrant. There is positive Murphy's sign.  Musculoskeletal: Normal range of motion.  Neurological: She is alert and oriented to person, place, and time.  Skin: Skin is warm and dry.  Psychiatric: She has a normal mood and affect. Her behavior is normal. Judgment and thought content normal.      Assessment/Plan Acute cholecystitis Admit IVF ABX  lap chole in next 24 hours Berlie Persky A. 07/19/2014, 12:16 AM

## 2014-07-19 NOTE — Progress Notes (Signed)
UR completed 

## 2014-07-19 NOTE — Anesthesia Procedure Notes (Signed)
Procedure Name: Intubation Date/Time: 07/19/2014 4:09 PM Performed by: Garner Nash Pre-anesthesia Checklist: Patient identified, Timeout performed, Emergency Drugs available, Suction available and Patient being monitored Patient Re-evaluated:Patient Re-evaluated prior to inductionOxygen Delivery Method: Circle system utilized Preoxygenation: Pre-oxygenation with 100% oxygen Intubation Type: IV induction Ventilation: Mask ventilation without difficulty Laryngoscope Size: Mac and 3 Grade View: Grade II Tube type: Oral Tube size: 7.5 mm Number of attempts: 1 Airway Equipment and Method: Stylet and LTA kit utilized Placement Confirmation: ETT inserted through vocal cords under direct vision,  breath sounds checked- equal and bilateral,  positive ETCO2 and CO2 detector Secured at: 22 cm Tube secured with: Tape Dental Injury: Teeth and Oropharynx as per pre-operative assessment

## 2014-07-19 NOTE — Progress Notes (Signed)
Placed patient on CPAP at 11cm with oxygen set at 2lpm with Sp02=97%

## 2014-07-19 NOTE — Op Note (Signed)
07/18/2014 - 07/19/2014  5:09 PM  PATIENT:  Lindsay ColaceJudy Brady  64 y.o. female  PRE-OPERATIVE DIAGNOSIS:  ACUTE CHOLECYSTITIS  POST-OPERATIVE DIAGNOSIS:  ACUTE CHOLECYSTITIS  PROCEDURE:  Procedure(s): LAPAROSCOPIC CHOLECYSTECTOMY WITH INTRAOPERATIVE CHOLANGIOGRAM  SURGEON:  Surgeon(s): Violeta GelinasBurke Braxtyn Dorff, MD  ASSISTANTS: Magnus IvanSheila Bowman, RNFA   ANESTHESIA:   local and general  EBL:  Total I/O In: 1635 [I.V.:1635] Out: 20 [Blood:20]  BLOOD ADMINISTERED:none  DRAINS: none   SPECIMEN:  Excision  DISPOSITION OF SPECIMEN:  PATHOLOGY  COUNTS:  YES  DICTATION: .Dragon Dictation  Findings: Acute cholecystitis, dilated biliary system without common duct stone and good flow of contrast into the duodenum  Lindsay Brady is brought for cholecystectomy with cholangiogram. She was identified in the preop holding area. Informed consent was obtained. She is on IV antibiotic protocol. She was brought to the operating room and general endotracheal anesthesia was administered by the anesthesia staff. Her abdomen was prepped and draped in sterile fashion. We did time out procedure. Informed local response infiltrated with local. Infraumbilical incision was made. 16 his tissues were dissected down revealing the anterior fascia. This was divided sharply along the midline. Peritoneal cavity was entered under direct vision. A pursestring suture was placed on the fascial opening. Hassan trocar was inserted. Abdomen was insufflated with carbon dioxide in standard fashion. Under direct vision, a 5 mm epigastric and 5 mm right lateral port 2 were placed. Local was used at each port site. Laparoscopic exploration revealed a acutely inflamed gallbladder with adherent omentum. The omentum was gently and gradually stripped away exposing the body of the gallbladder. The dome was retracted superior medially. We finished teasing off the omentum carefully exposing the infundibulum. The infundibulum was retracted inferior laterally.  Dissection began laterally and progressed medially easily identifying the cystic duct. We also identified the cystic artery which had a posterior branch. Dissection on the cystic duct continued until we had a critical view between the infundibulum, the cystic duct, and the liver. Clip was placed at the infundibular cystic duct junction. Small nick was made in the cystic duct. Intraoperative cholangiogram was obtained demonstrating a dilated biliary system but after some slight pressure contrast flushed easily into the duodenum consistent with flushing a stone or a recently passed stone. Classroom catheter was removed. 3 clips were placed proximally on the cystic duct and it was divided. The anterior and posterior branch of the cystic artery were clipped twice proximally and divided distally with cautery. Gallbladder was taken off the liver bed with Bovie cautery achieving excellent hemostasis. Gallbladder was placed in an ecosac and removed from the abdomen via the infraumbilical port site. Liver bed was copiously irrigated. Hemostasis was ensured. Irrigation returned clear. Ports were removed under direct vision. Pneumoperitoneum was released. Informed local fascia was closed by tying the pursestring suture. An additional figure-of-eight 0 Vicryl was placed to reinforce the closure. All 4 wounds were copiously irrigated and the skin of each was closed with running 4 Vicryl subcuticular followed by Dermabond. All counts were correct. Patient tolerated procedure well without apparent complication was taken recovery in stable condition.  PATIENT DISPOSITION:  PACU - hemodynamically stable.   Delay start of Pharmacological VTE agent (>24hrs) due to surgical blood loss or risk of bleeding:  no  Violeta GelinasBurke Keiji Melland, MD, MPH, FACS Pager: [phone]  2/19/20165:09 PM

## 2014-07-20 ENCOUNTER — Observation Stay (HOSPITAL_COMMUNITY): Payer: Managed Care, Other (non HMO)

## 2014-07-20 ENCOUNTER — Encounter (HOSPITAL_COMMUNITY): Payer: Self-pay | Admitting: Student

## 2014-07-20 LAB — COMPREHENSIVE METABOLIC PANEL
ALT: 307 U/L — ABNORMAL HIGH (ref 0–35)
ANION GAP: 7 (ref 5–15)
AST: 234 U/L — AB (ref 0–37)
Albumin: 3 g/dL — ABNORMAL LOW (ref 3.5–5.2)
Alkaline Phosphatase: 130 U/L — ABNORMAL HIGH (ref 39–117)
BILIRUBIN TOTAL: 3 mg/dL — AB (ref 0.3–1.2)
BUN: 13 mg/dL (ref 6–23)
CALCIUM: 8.5 mg/dL (ref 8.4–10.5)
CHLORIDE: 103 mmol/L (ref 96–112)
CO2: 25 mmol/L (ref 19–32)
Creatinine, Ser: 0.86 mg/dL (ref 0.50–1.10)
GFR calc non Af Amer: 70 mL/min — ABNORMAL LOW (ref >90)
GFR, EST AFRICAN AMERICAN: 82 mL/min — AB (ref >90)
Glucose, Bld: 171 mg/dL — ABNORMAL HIGH (ref 70–99)
Potassium: 4 mmol/L (ref 3.5–5.1)
SODIUM: 135 mmol/L (ref 135–145)
Total Protein: 5.7 g/dL — ABNORMAL LOW (ref 6.0–8.3)

## 2014-07-20 MED ORDER — GADOPENTETATE DIMEGLUMINE 469.01 MG/ML IV SOLN
20.0000 mL | Freq: Once | INTRAVENOUS | Status: AC | PRN
  Administered 2014-07-20: 20 mL via INTRAVENOUS

## 2014-07-20 MED ORDER — ALBUTEROL SULFATE HFA 108 (90 BASE) MCG/ACT IN AERS
1.0000 | INHALATION_SPRAY | Freq: Four times a day (QID) | RESPIRATORY_TRACT | Status: DC | PRN
  Administered 2014-07-20: 1 via RESPIRATORY_TRACT

## 2014-07-20 MED ORDER — ALBUTEROL SULFATE (2.5 MG/3ML) 0.083% IN NEBU
3.0000 mL | INHALATION_SOLUTION | Freq: Four times a day (QID) | RESPIRATORY_TRACT | Status: DC | PRN
  Administered 2014-07-21 – 2014-07-22 (×4): 3 mL via RESPIRATORY_TRACT
  Filled 2014-07-20 (×4): qty 3

## 2014-07-20 MED ORDER — DEXTROSE-NACL 5-0.9 % IV SOLN
INTRAVENOUS | Status: DC
  Administered 2014-07-20 (×2): via INTRAVENOUS

## 2014-07-20 NOTE — Progress Notes (Signed)
Utilization Review completed.  

## 2014-07-20 NOTE — Progress Notes (Signed)
Patient is refusing CPAP tonight.

## 2014-07-20 NOTE — Progress Notes (Signed)
Patient ID: Jeanann Balinski, female   DOB: Nov 30, 1950, 64 y.o.   MRN: [id number] 1 Day Post-Op   Subjective: Having some shortness of breath and anxiety.    Objective: Vital signs in last 24 hours: Temp:  [98.7 F (37.1 C)-100.6 F (38.1 C)] 98.7 F (37.1 C) (02/20 0603) Pulse Rate:  [78-87] 78 (02/20 0603) Resp:  [18-32] 19 (02/20 0603) BP: (95-147)/(40-69) 116/58 mmHg (02/20 0603) SpO2:  [90 %-97 %] 92 % (02/20 0603) Last BM Date: 07/18/14  Intake/Output from previous day: 02/19 0701 - 02/20 0700 In: 2154 [I.V.:2154] Out: 245 [Urine:225; Blood:20] Intake/Output this shift: Total I/O In: 240 [P.O.:240] Out: -   PE: Abd: soft, tender in RUQ, obese Heart: regular Lungs: CTAB  Lab Results:   Recent Labs  07/18/14 1950 07/19/14 0237  WBC 8.9 9.5  HGB 13.6 13.5  HCT 40.0 40.0  PLT 233 216   BMET  Recent Labs  07/19/14 0237 07/20/14 0504  NA 137 135  K 4.4 4.0  CL 106 103  CO2 27 25  GLUCOSE 180* 171*  BUN 13 13  CREATININE 0.80 0.86  CALCIUM 8.8 8.5   PT/INR No results for input(s): LABPROT, INR in the last 72 hours. CMP     Component Value Date/Time   NA 135 07/20/2014 0504   K 4.0 07/20/2014 0504   CL 103 07/20/2014 0504   CO2 25 07/20/2014 0504   GLUCOSE 171* 07/20/2014 0504   BUN 13 07/20/2014 0504   CREATININE 0.86 07/20/2014 0504   CALCIUM 8.5 07/20/2014 0504   PROT 5.7* 07/20/2014 0504   ALBUMIN 3.0* 07/20/2014 0504   AST 234* 07/20/2014 0504   ALT 307* 07/20/2014 0504   ALKPHOS 130* 07/20/2014 0504   BILITOT 3.0* 07/20/2014 0504   GFRNONAA 70* 07/20/2014 0504   GFRAA 82* 07/20/2014 0504   Lipase     Component Value Date/Time   LIPASE 31 07/18/2014 2035       Studies/Results: Dg Chest 2 View  07/20/2014   CLINICAL DATA:  Hypoxia, status postcholecystectomy  EXAM: CHEST  2 VIEW  COMPARISON:  07/18/2014  FINDINGS: Cardiomediastinal silhouette is stable. No segmental infiltrate. Again noted mild interstitial prominence bilaterally  without change from prior exam. Mild interstitial edema cannot be excluded. No significant central vascular congestion.  IMPRESSION: No segmental infiltrate. Again noted mild interstitial prominence bilaterally. Mild interstitial edema cannot be excluded.   Electronically Signed   By: Natasha Mead M.D.   On: 07/20/2014 12:15   Dg Chest 2 View  07/18/2014   CLINICAL DATA:  Per EMS: Pt from home for sudden onset of right side chest tightness, pt reports increased sob and also nausea. Non-Smoker.  EXAM: CHEST  2 VIEW  COMPARISON:  None.  FINDINGS: Cardiac silhouette normal in size and configuration. No mediastinal or hilar masses or convincing adenopathy.  Mild bilateral interstitial thickening. There are additional reticular opacities in the upper lobes mostly on the right. This may all be chronic reflecting scarring. Mild interstitial edema should be considered. There is no airspace consolidation to suggest pneumonia. There is no pleural effusion or pneumothorax.  Bony thorax is demineralized but grossly intact.  IMPRESSION: 1. Mild interstitial thickening most prominent in the right upper lobe. This may all be chronic. Mild interstitial edema should be considered. 2. No evidence of pneumonia.   Electronically Signed   By: Amie Portland M.D.   On: 07/18/2014 20:39   Dg Cholangiogram Operative  07/19/2014   CLINICAL DATA:  64 year old female  with acute cholecystitis  EXAM: INTRAOPERATIVE CHOLANGIOGRAM  TECHNIQUE: Cholangiographic images from the C-arm fluoroscopic device were submitted for interpretation post-operatively. Please see the procedural report for the amount of contrast and the fluoroscopy time utilized.  COMPARISON:  Abdominal ultrasound 07/18/2014  FINDINGS: Cine loop obtained at the time of laparoscopic cholecystectomy demonstrates cannulation of the cystic duct remanent and intraoperative cholangiogram. No evidence of biliary ductal dilatation. No choledocholithiasis, stenosis or stricture. Contrast  material flows freely through the ampulla and into the duodenum.  IMPRESSION: Negative intraoperative cholangiogram.   Electronically Signed   By: Malachy MoanHeath  McCullough M.D.   On: 07/19/2014 17:01   [place] Abdomen Complete  07/18/2014   CLINICAL DATA:  Right upper quadrant pain  EXAM: ULTRASOUND ABDOMEN COMPLETE  COMPARISON:  None.  FINDINGS: Gallbladder: Cholelithiasis. Distended gallbladder. No mural thickening or pericholecystic fluid. The patient was not tender over the gallbladder.  Common bile duct: Diameter: There is abnormal dilatation of the extrahepatic bile ducts, 1.2 cm  Liver: No focal lesion identified. Within normal limits in parenchymal echogenicity.  IVC: No abnormality visualized.  Pancreas: Not well seen.  Spleen: Size and appearance within normal limits.  Right Kidney: Length: 11.4 cm. Echogenicity within normal limits. No mass or hydronephrosis visualized.  Left Kidney: Length: 13.2 cm. Echogenicity within normal limits. No mass or hydronephrosis visualized.  Abdominal aorta: No aneurysm visualized.  Other findings: None.  IMPRESSION: Cholelithiasis. Dilated extrahepatic bile ducts and distended gallbladder. Cannot exclude choledocholithiasis. Poor visibility of the pancreas.   Electronically Signed   By: Ellery Plunkaniel R Mitchell M.D.   On: 07/18/2014 23:09    Anti-infectives: Anti-infectives    Start     Dose/Rate Route Frequency Ordered Stop   07/19/14 0030  ciprofloxacin (CIPRO) IVPB 400 mg  Status:  Discontinued     400 mg 200 mL/hr over 60 Minutes Intravenous Every 12 hours 07/19/14 0025 07/19/14 1852       Assessment/Plan  1. Acute cholecystitis  2. Elevated LFTs, possible choledocholithiasis  Plan: 1. MRCP today.  If common duct stone seen, will need ERCP.  NPO after MN.     Celinda Dethlefs 07/20/2014, 12:31 PM

## 2014-07-20 NOTE — Anesthesia Postprocedure Evaluation (Signed)
  Anesthesia Post-op Note  Patient: Lindsay Brady  Procedure(s) Performed: Procedure(s): LAPAROSCOPIC CHOLECYSTECTOMY WITH INTRAOPERATIVE CHOLANGIOGRAM (N/A)  Patient Location: PACU  Anesthesia Type:General  Level of Consciousness: awake  Airway and Oxygen Therapy: Patient Spontanous Breathing  Post-op Pain: mild  Post-op Assessment: Post-op Vital signs reviewed, Patient's Cardiovascular Status Stable, Respiratory Function Stable, Patent Airway, No signs of Nausea or vomiting and Pain level controlled  Post-op Vital Signs: Reviewed and stable  Last Vitals:  Filed Vitals:   07/20/14 0603  BP: 116/58  Pulse: 78  Temp: 37.1 C  Resp: 19    Complications: No apparent anesthesia complications

## 2014-07-20 NOTE — Progress Notes (Addendum)
Tolerated regular diet this am, IV changed to SL as no IV meds.  Orders noted for npo and further testing, IV restarted and pt instructed NPO except chips at 1330.

## 2014-07-21 LAB — COMPREHENSIVE METABOLIC PANEL
ALT: 198 U/L — ABNORMAL HIGH (ref 0–35)
ANION GAP: 4 — AB (ref 5–15)
AST: 87 U/L — AB (ref 0–37)
Albumin: 2.8 g/dL — ABNORMAL LOW (ref 3.5–5.2)
Alkaline Phosphatase: 128 U/L — ABNORMAL HIGH (ref 39–117)
BILIRUBIN TOTAL: 1.9 mg/dL — AB (ref 0.3–1.2)
BUN: 12 mg/dL (ref 6–23)
CALCIUM: 8.5 mg/dL (ref 8.4–10.5)
CO2: 29 mmol/L (ref 19–32)
CREATININE: 0.88 mg/dL (ref 0.50–1.10)
Chloride: 103 mmol/L (ref 96–112)
GFR calc Af Amer: 79 mL/min — ABNORMAL LOW (ref >90)
GFR calc non Af Amer: 68 mL/min — ABNORMAL LOW (ref >90)
Glucose, Bld: 133 mg/dL — ABNORMAL HIGH (ref 70–99)
Potassium: 3.9 mmol/L (ref 3.5–5.1)
Sodium: 136 mmol/L (ref 135–145)
Total Protein: 5.8 g/dL — ABNORMAL LOW (ref 6.0–8.3)

## 2014-07-21 LAB — CBC
HCT: 33.7 % — ABNORMAL LOW (ref 36.0–46.0)
Hemoglobin: 11.2 g/dL — ABNORMAL LOW (ref 12.0–15.0)
MCH: 31.7 pg (ref 26.0–34.0)
MCHC: 33.2 g/dL (ref 30.0–36.0)
MCV: 95.5 fL (ref 78.0–100.0)
Platelets: 167 10*3/uL (ref 150–400)
RBC: 3.53 MIL/uL — ABNORMAL LOW (ref 3.87–5.11)
RDW: 14.9 % (ref 11.5–15.5)
WBC: 9.4 10*3/uL (ref 4.0–10.5)

## 2014-07-21 MED ORDER — ASPIRIN EC 81 MG PO TBEC
81.0000 mg | DELAYED_RELEASE_TABLET | Freq: Every day | ORAL | Status: DC
  Administered 2014-07-21: 81 mg via ORAL
  Filled 2014-07-21: qty 1

## 2014-07-21 MED ORDER — TRIAMTERENE-HCTZ 37.5-25 MG PO TABS
1.0000 | ORAL_TABLET | Freq: Every day | ORAL | Status: DC | PRN
  Filled 2014-07-21: qty 1

## 2014-07-21 MED ORDER — ESCITALOPRAM OXALATE 10 MG PO TABS
10.0000 mg | ORAL_TABLET | Freq: Every day | ORAL | Status: DC
  Administered 2014-07-21: 10 mg via ORAL
  Filled 2014-07-21: qty 1

## 2014-07-21 NOTE — Progress Notes (Signed)
Patient refuses CPAP. She is unable to tolerate the mask at this time. RT will continue to monitor

## 2014-07-21 NOTE — Progress Notes (Signed)
2 Days Post-Op  Subjective: Breathing better, hungry, in chair but has not walked  Objective: Vital signs in last 24 hours: Temp:  [98.2 F (36.8 C)-100.2 F (37.9 C)] 99.1 F (37.3 C) (02/21 0512) Pulse Rate:  [71-80] 71 (02/21 0512) Resp:  [18-22] 20 (02/21 0512) BP: (116-159)/(59-68) 128/63 mmHg (02/21 0512) SpO2:  [92 %-94 %] 92 % (02/21 0512) Last BM Date: 07/18/14  Intake/Output from previous day: 02/20 0701 - 02/21 0700 In: 963.8 [P.O.:240; I.V.:723.8] Out: 650 [Urine:650] Intake/Output this shift: Total I/O In: 720 [I.V.:720] Out: -   General : no distress cv rrr Pulm decreased bilateral bases Ab incisions clean without infection  Lab Results:   Recent Labs  07/19/14 0237 07/21/14 0430  WBC 9.5 9.4  HGB 13.5 11.2*  HCT 40.0 33.7*  PLT 216 167   BMET  Recent Labs  07/20/14 0504 07/21/14 0430  NA 135 136  K 4.0 3.9  CL 103 103  CO2 25 29  GLUCOSE 171* 133*  BUN 13 12  CREATININE 0.86 0.88  CALCIUM 8.5 8.5   PT/INR No results for input(s): LABPROT, INR in the last 72 hours. ABG No results for input(s): PHART, HCO3 in the last 72 hours.  Invalid input(s): PCO2, PO2  Studies/Results: Dg Chest 2 View  07/20/2014   CLINICAL DATA:  Hypoxia, status postcholecystectomy  EXAM: CHEST  2 VIEW  COMPARISON:  07/18/2014  FINDINGS: Cardiomediastinal silhouette is stable. No segmental infiltrate. Again noted mild interstitial prominence bilaterally without change from prior exam. Mild interstitial edema cannot be excluded. No significant central vascular congestion.  IMPRESSION: No segmental infiltrate. Again noted mild interstitial prominence bilaterally. Mild interstitial edema cannot be excluded.   Electronically Signed   By: Natasha MeadLiviu  Pop M.D.   On: 07/20/2014 12:15   Dg Cholangiogram Operative  07/19/2014   CLINICAL DATA:  64 year old female with acute cholecystitis  EXAM: INTRAOPERATIVE CHOLANGIOGRAM  TECHNIQUE: Cholangiographic images from the C-arm  fluoroscopic device were submitted for interpretation post-operatively. Please see the procedural report for the amount of contrast and the fluoroscopy time utilized.  COMPARISON:  Abdominal ultrasound 07/18/2014  FINDINGS: Cine loop obtained at the time of laparoscopic cholecystectomy demonstrates cannulation of the cystic duct remanent and intraoperative cholangiogram. No evidence of biliary ductal dilatation. No choledocholithiasis, stenosis or stricture. Contrast material flows freely through the ampulla and into the duodenum.  IMPRESSION: Negative intraoperative cholangiogram.   Electronically Signed   By: Malachy MoanHeath  McCullough M.D.   On: 07/19/2014 17:01   Mr 3d Recon At Scanner  07/20/2014   CLINICAL DATA:  Status post cholecystectomy. Normal intraoperative cholangiogram. Biliary dilatation.  EXAM: MRI ABDOMEN WITHOUT AND WITH CONTRAST (INCLUDING MRCP)  TECHNIQUE: Multiplanar multisequence MR imaging of the abdomen was performed both before and after the administration of intravenous contrast. Heavily T2-weighted images of the biliary and pancreatic ducts were obtained, and three-dimensional MRCP images were rendered by post processing.  CONTRAST:  20mL MAGNEVIST GADOPENTETATE DIMEGLUMINE 469.01 MG/ML IV SOLN  COMPARISON:  Ultrasound 07/18/2014 and intraoperative cholangiogram 07/19/2014  FINDINGS: Examination is limited by patient motion.  Lower chest: The lung bases demonstrate small effusions and bibasilar atelectasis. The heart is mildly enlarged. No pericardial effusion.  Hepatobiliary: Diffuse fatty infiltration of the liver is noted. No worrisome hepatic lesions. No intrahepatic biliary dilatation. The common bile duct is slightly dilated at 9 mm but no common bile duct stones. It tapers normally to the ampulla. The pancreatic duct is normal. Surgical changes are noted in the gallbladder fossa from  recent cholecystectomy.  Pancreas: Normal.  Spleen: Normal  Adrenals/Urinary Tract: Normal  Stomach/Bowel:  The stomach, duodenum, visualized small bowel and visualized colon are grossly normal.  Vascular/Lymphatic: No mesenteric or retroperitoneal mass or adenopathy. The aorta and branch vessels are normal. The major venous structures are patent.  Other: Fluid and blood in the gallbladder fossa and around the liver common not unexpected following surgery.  Musculoskeletal: No significant bony findings.  IMPRESSION: 1. Mildly prominent common bile duct at 9 mm as demonstrated on the intraoperative cholangiogram but no common bile duct stones, pancreatic head mass or ampullary lesion. No intrahepatic biliary dilatation. 2. Diffuse fatty infiltration of the liver but no worrisome hepatic lesions. 3. Expected surgical changes in the gallbladder fossa. 4. Normal pancreas and normal caliber pancreatic duct.   Electronically Signed   By: Rudie Meyer M.D.   On: 07/20/2014 17:01   Mr [name] W/wo Cm/mrcp  07/20/2014   CLINICAL DATA:  Status post cholecystectomy. Normal intraoperative cholangiogram. Biliary dilatation.  EXAM: MRI ABDOMEN WITHOUT AND WITH CONTRAST (INCLUDING MRCP)  TECHNIQUE: Multiplanar multisequence MR imaging of the abdomen was performed both before and after the administration of intravenous contrast. Heavily T2-weighted images of the biliary and pancreatic ducts were obtained, and three-dimensional MRCP images were rendered by post processing.  CONTRAST:  20mL MAGNEVIST GADOPENTETATE DIMEGLUMINE 469.01 MG/ML IV SOLN  COMPARISON:  Ultrasound 07/18/2014 and intraoperative cholangiogram 07/19/2014  FINDINGS: Examination is limited by patient motion.  Lower chest: The lung bases demonstrate small effusions and bibasilar atelectasis. The heart is mildly enlarged. No pericardial effusion.  Hepatobiliary: Diffuse fatty infiltration of the liver is noted. No worrisome hepatic lesions. No intrahepatic biliary dilatation. The common bile duct is slightly dilated at 9 mm but no common bile duct stones. It tapers  normally to the ampulla. The pancreatic duct is normal. Surgical changes are noted in the gallbladder fossa from recent cholecystectomy.  Pancreas: Normal.  Spleen: Normal  Adrenals/Urinary Tract: Normal  Stomach/Bowel: The stomach, duodenum, visualized small bowel and visualized colon are grossly normal.  Vascular/Lymphatic: No mesenteric or retroperitoneal mass or adenopathy. The aorta and branch vessels are normal. The major venous structures are patent.  Other: Fluid and blood in the gallbladder fossa and around the liver common not unexpected following surgery.  Musculoskeletal: No significant bony findings.  IMPRESSION: 1. Mildly prominent common bile duct at 9 mm as demonstrated on the intraoperative cholangiogram but no common bile duct stones, pancreatic head mass or ampullary lesion. No intrahepatic biliary dilatation. 2. Diffuse fatty infiltration of the liver but no worrisome hepatic lesions. 3. Expected surgical changes in the gallbladder fossa. 4. Normal pancreas and normal caliber pancreatic duct.   Electronically Signed   By: Rudie Meyer M.D.   On: 07/20/2014 17:01    Anti-infectives: Anti-infectives    Start     Dose/Rate Route Frequency Ordered Stop   07/19/14 0030  ciprofloxacin (CIPRO) IVPB 400 mg  Status:  Discontinued     400 mg 200 mL/hr over 60 Minutes Intravenous Every 12 hours 07/19/14 0025 07/19/14 1852      Assessment/Plan: POD 2 lap chole  1. Po pain meds 2. Wean oxygen today, pulm toilet, cxr without any real abnormality, better with nebs, needs to be oob and walking, she has not done this yet 3. Regular diet 4. lfts betters mrcp negative, will recheck in am 5. If lfts better and doing well, hopefully home in am tomorrow  [facility] 07/21/2014

## 2014-07-21 NOTE — Progress Notes (Signed)
UR completed 

## 2014-07-22 LAB — HEPATIC FUNCTION PANEL
ALK PHOS: 141 U/L — AB (ref 39–117)
ALT: 135 U/L — ABNORMAL HIGH (ref 0–35)
AST: 42 U/L — ABNORMAL HIGH (ref 0–37)
Albumin: 2.7 g/dL — ABNORMAL LOW (ref 3.5–5.2)
Bilirubin, Direct: 0.4 mg/dL (ref 0.0–0.5)
Indirect Bilirubin: 0.7 mg/dL (ref 0.3–0.9)
Total Bilirubin: 1.1 mg/dL (ref 0.3–1.2)
Total Protein: 5.8 g/dL — ABNORMAL LOW (ref 6.0–8.3)

## 2014-07-22 MED ORDER — OXYCODONE-ACETAMINOPHEN 5-325 MG PO TABS: 1.0000 | ORAL_TABLET | Freq: Four times a day (QID) | ORAL | Status: DC | PRN

## 2014-07-22 NOTE — Discharge Instructions (Signed)

## 2014-07-22 NOTE — Progress Notes (Signed)
Discharge instructions reviewed with patient at the bedside. Rx given and all medications reviewed. Pt verbalizes understanding and that she will be ready for discharge soon.

## 2014-07-22 NOTE — Discharge Summary (Signed)
[facility] Discharge Summary   Patient ID: Lindsay Brady MRN: [id number] DOB/AGE: Feb 25, 1951 64 y.o.  Admit date: 07/18/2014 Discharge date: 07/22/2014  Admitting Diagnosis: Acute cholecystitis  Discharge Diagnosis Patient Active Problem List   Diagnosis Date Noted  . Acute cholecystitis 07/19/2014    Consultants None  Imaging: Dg Chest 2 View  07/20/2014   CLINICAL DATA:  Hypoxia, status postcholecystectomy  EXAM: CHEST  2 VIEW  COMPARISON:  07/18/2014  FINDINGS: Cardiomediastinal silhouette is stable. No segmental infiltrate. Again noted mild interstitial prominence bilaterally without change from prior exam. Mild interstitial edema cannot be excluded. No significant central vascular congestion.  IMPRESSION: No segmental infiltrate. Again noted mild interstitial prominence bilaterally. Mild interstitial edema cannot be excluded.   Electronically Signed   By: Lindsay Brady M.D.   On: 07/20/2014 12:15   Mr 3d Recon At Scanner  07/20/2014   CLINICAL DATA:  Status post cholecystectomy. Normal intraoperative cholangiogram. Biliary dilatation.  EXAM: MRI ABDOMEN WITHOUT AND WITH CONTRAST (INCLUDING MRCP)  TECHNIQUE: Multiplanar multisequence MR imaging of the abdomen was performed both before and after the administration of intravenous contrast. Heavily T2-weighted images of the biliary and pancreatic ducts were obtained, and three-dimensional MRCP images were rendered by post processing.  CONTRAST:  20mL MAGNEVIST GADOPENTETATE DIMEGLUMINE 469.01 MG/ML IV SOLN  COMPARISON:  Ultrasound 07/18/2014 and intraoperative cholangiogram 07/19/2014  FINDINGS: Examination is limited by patient motion.  Lower chest: The lung bases demonstrate small effusions and bibasilar atelectasis. The heart is mildly enlarged. No pericardial effusion.  Hepatobiliary: Diffuse fatty infiltration of the liver is noted. No worrisome hepatic lesions. No intrahepatic biliary dilatation. The common bile duct is  slightly dilated at 9 mm but no common bile duct stones. It tapers normally to the ampulla. The pancreatic duct is normal. Surgical changes are noted in the gallbladder fossa from recent cholecystectomy.  Pancreas: Normal.  Spleen: Normal  Adrenals/Urinary Tract: Normal  Stomach/Bowel: The stomach, duodenum, visualized small bowel and visualized colon are grossly normal.  Vascular/Lymphatic: No mesenteric or retroperitoneal mass or adenopathy. The aorta and branch vessels are normal. The major venous structures are patent.  Other: Fluid and blood in the gallbladder fossa and around the liver common not unexpected following surgery.  Musculoskeletal: No significant bony findings.  IMPRESSION: 1. Mildly prominent common bile duct at 9 mm as demonstrated on the intraoperative cholangiogram but no common bile duct stones, pancreatic head mass or ampullary lesion. No intrahepatic biliary dilatation. 2. Diffuse fatty infiltration of the liver but no worrisome hepatic lesions. 3. Expected surgical changes in the gallbladder fossa. 4. Normal pancreas and normal caliber pancreatic duct.   Electronically Signed   By: Lindsay Brady M.D.   On: 07/20/2014 17:01   Mr [name] W/wo Cm/mrcp  07/20/2014   CLINICAL DATA:  Status post cholecystectomy. Normal intraoperative cholangiogram. Biliary dilatation.  EXAM: MRI ABDOMEN WITHOUT AND WITH CONTRAST (INCLUDING MRCP)  TECHNIQUE: Multiplanar multisequence MR imaging of the abdomen was performed both before and after the administration of intravenous contrast. Heavily T2-weighted images of the biliary and pancreatic ducts were obtained, and three-dimensional MRCP images were rendered by post processing.  CONTRAST:  20mL MAGNEVIST GADOPENTETATE DIMEGLUMINE 469.01 MG/ML IV SOLN  COMPARISON:  Ultrasound 07/18/2014 and intraoperative cholangiogram 07/19/2014  FINDINGS: Examination is limited by patient motion.  Lower chest: The lung bases demonstrate small effusions and bibasilar  atelectasis. The heart is mildly enlarged. No pericardial effusion.  Hepatobiliary: Diffuse fatty infiltration of the liver is noted. No worrisome hepatic lesions. No  intrahepatic biliary dilatation. The common bile duct is slightly dilated at 9 mm but no common bile duct stones. It tapers normally to the ampulla. The pancreatic duct is normal. Surgical changes are noted in the gallbladder fossa from recent cholecystectomy.  Pancreas: Normal.  Spleen: Normal  Adrenals/Urinary Tract: Normal  Stomach/Bowel: The stomach, duodenum, visualized small bowel and visualized colon are grossly normal.  Vascular/Lymphatic: No mesenteric or retroperitoneal mass or adenopathy. The aorta and branch vessels are normal. The major venous structures are patent.  Other: Fluid and blood in the gallbladder fossa and around the liver common not unexpected following surgery.  Musculoskeletal: No significant bony findings.  IMPRESSION: 1. Mildly prominent common bile duct at 9 mm as demonstrated on the intraoperative cholangiogram but no common bile duct stones, pancreatic head mass or ampullary lesion. No intrahepatic biliary dilatation. 2. Diffuse fatty infiltration of the liver but no worrisome hepatic lesions. 3. Expected surgical changes in the gallbladder fossa. 4. Normal pancreas and normal caliber pancreatic duct.   Electronically Signed   By: Lindsay MeyerP.  Brady M.D.   On: 07/20/2014 17:01    Procedures Dr. [name] (07/19/14) - [facility]   Hospital Course:  64 y/o white female with multiple chronic medical problems including DM type 2, COPD and sleep apnea [facility] with sharp severe RUQ abdominal pain after eating at 5 pm. Pain is sharp, Constant and located in RUQ. Radiates into lower chest. US shows gallstones distended GB and 1CM CBD. Pt having sever NV.   Patient was admitted and underwent procedure listed above.  Tolerated procedure well and was transferred to the floor.  Diet was  advanced as tolerated.  She did experience some trouble breathing post-operatively which was resolved with pulmonary toilet and nebulizer treatments.  She is ambulating much better.  Her oxygen was weaned to off.  On POD #1 her LFTs increased and thus a CBD stone was suspected, however both IOC and MRCP were normal.  On POD #3, the patient was voiding well, tolerating diet, ambulating well, pain well controlled, vital signs stable, incisions c/d/i and felt stable for discharge home.  Patient will follow up in our office in 3 weeks and knows to call with questions or concerns.   Physical Exam: General:  Alert, NAD, pleasant, comfortable Abd:  Obese, soft, ND, mild tenderness, incisions C/D/I with dermabond in place     Medication List    TAKE these medications        Aclidinium Bromide 400 MCG/ACT Aepb  Inhale 1 puff into the lungs 2 (two) times daily.     albuterol 108 (90 BASE) MCG/ACT inhaler  Commonly known as:  PROVENTIL HFA;VENTOLIN HFA  Inhale 2 puffs into the lungs every 6 (six) hours as needed for wheezing or shortness of breath.     aspirin EC 81 MG tablet  Take 81 mg by mouth at bedtime.     ASTRAGALUS PO  Take 1 tablet by mouth every evening.     CENTRUM PO  Take 1 tablet by mouth 2 (two) times daily. Chewable tablet     CO Q 10 PO  Take 1 tablet by mouth 2 (two) times daily.     conjugated estrogens vaginal cream  Commonly known as:  PREMARIN  Place 1 Applicatorful vaginally every other day.     CRANBERRY PO  Take 1 tablet by mouth daily as needed (only when taking HCTZ).     escitalopram 10 MG tablet  Commonly known as:  LEXAPRO  Take 10 mg by mouth at bedtime.     FISH OIL TRIPLE STRENGTH 1400 MG Caps  Take 2 capsules by mouth every evening.     MOVE FREE PO  Take 1 tablet by mouth every evening.     oxyCODONE-acetaminophen 5-325 MG per tablet  Commonly known as:  PERCOCET/ROXICET  Take 1-2 tablets by mouth every 6 (six) hours as needed for moderate  pain.     simvastatin 10 MG tablet  Commonly known as:  ZOCOR  Take 10 mg by mouth every evening.     triamterene-hydrochlorothiazide 37.5-25 MG per tablet  Commonly known as:  MAXZIDE-25  Take 1 tablet by mouth daily as needed (fluid).     Vitamin D 2000 UNITS Caps  Take 2,000 Units by mouth every evening.         Follow-up Information    Follow up with Baron Hamper, MD.   Specialty:  Internal Medicine   Why:  For post-hospital follow up regarding your breathing if you continue to need your puffer daily.   Contact information:   [address] [place] [address] [phone]       Follow up with [facility] [place] On 08/13/2014.   Why:  For post-operation check. Your appointment is at 2:00pm, please arrive at least 30 min before your appointment to complete your check in paperwork.  If you are unable to arrive 30 min prior to your appointment time we may have to cancel or reschedule you   Contact information:   [address] [place] [address] [phone]      Signed: Aris Georgia, [facility] [phone]  07/22/2014, 9:17 AM

## 2014-12-23 ENCOUNTER — Telehealth (HOSPITAL_COMMUNITY): Payer: Self-pay

## 2014-12-23 NOTE — Telephone Encounter (Signed)
I have called and left a message with Shiya to inquire about participation in Pulmonary Rehab per Dr. Hinson's referral. Will send letter in mail and follow up.  

## 2014-12-23 NOTE — Telephone Encounter (Signed)
Patient returned message regarding Pulmonary Rehab. I have called and left another message with Amiel to inquire about participation in Pulmonary Rehab per Dr. Noah Charon referral. Will send letter in mail and follow up.

## 2015-01-27 ENCOUNTER — Telehealth (HOSPITAL_COMMUNITY): Payer: Self-pay

## 2015-01-27 NOTE — Telephone Encounter (Signed)
Called patient regarding entrance to Pulmonary Rehab.  Patient states that they are interested in attending the program.  Ayane is going to verify insurance coverage and follow up.    

## 2015-02-21 ENCOUNTER — Telehealth (HOSPITAL_COMMUNITY): Payer: Self-pay

## 2015-02-21 NOTE — Telephone Encounter (Signed)
I have called and left a message with Keyondra to inquire about participation in Pulmonary Rehab per Dr. Noah Charon referral. Will send letter in mail and follow up.

## 2015-06-01 DIAGNOSIS — R0902 Hypoxemia: Secondary | ICD-10-CM | POA: Insufficient documentation

## 2015-06-10 ENCOUNTER — Telehealth (HOSPITAL_COMMUNITY): Payer: Self-pay | Admitting: *Deleted

## 2015-12-05 ENCOUNTER — Ambulatory Visit (INDEPENDENT_AMBULATORY_CARE_PROVIDER_SITE_OTHER)
Admission: RE | Admit: 2015-12-05 | Discharge: 2015-12-05 | Disposition: A | Discharge status: Home / Self Care | Payer: 59 | Source: Ambulatory Visit | Attending: Internal Medicine | Admitting: Internal Medicine

## 2015-12-05 ENCOUNTER — Encounter: Payer: Self-pay | Admitting: Internal Medicine

## 2015-12-05 ENCOUNTER — Ambulatory Visit (INDEPENDENT_AMBULATORY_CARE_PROVIDER_SITE_OTHER): Payer: 59 | Admitting: Internal Medicine

## 2015-12-05 VITALS — BP 120/78 | HR 71 | Ht 65.25 in | Wt 279.2 lb

## 2015-12-05 DIAGNOSIS — J449 Chronic obstructive pulmonary disease, unspecified: Secondary | ICD-10-CM | POA: Diagnosis not present

## 2015-12-05 DIAGNOSIS — R059 Cough, unspecified: Secondary | ICD-10-CM

## 2015-12-05 DIAGNOSIS — R05 Cough: Secondary | ICD-10-CM

## 2015-12-05 NOTE — Progress Notes (Signed)
Subjective:    Patient ID: Lindsay ColaceJudy Brady, female    DOB: [birth year],    MRN: [id number]  HPI  [age] yowf quit smoking 1998 at wt around 200 with some am cough and did fine s rx but in 2011 noted doe across parking lot where worked and [facility] and self referred to pulmonary clinic 12/05/2015 as [facility]   12/05/2015 1st [facility] office visit/ Krystal Delduca   Chief Complaint  Patient presents with  . Pulmonary Consult    Self referral. Pt states dxed with Emphysema in 2011. [facility] the [facility]. She c/o hoarseness and increased cough for the past yr.  Cough is prod with white to tan sputum.  She also c/o SOB with walking long distances and up stairs. Hot weather tends to make her breathing worse. She uses ventolin 2 x per day on average.   indolent onset doe x 6 y better on tudorza and spiriva (mouth dry) and no change on symbicort  Needs saba at least twice daily  Already did rehab with wt = ? But did well  Doe = MMRC3 = can't walk 100 yards even at a slow pace at a flat grade s stopping due to sob    Cough p stopped smoking got worse then better and resolved until one year prior to OV  And present daily since but never while on cpap / neg w/u reflux by GI but ent disagreed   No obvious other patterns in day to day or daytime variabilty or assoc  cp or chest tightness, subjective wheeze overt sinus or hb symptoms. No unusual exp hx or h/o childhood pna/ asthma or knowledge of premature birth.  Sleeping ok without nocturnal  or early am exacerbation  of respiratory  c/o's or need for noct saba. Also denies any obvious fluctuation of symptoms with weather or environmental changes or other aggravating or alleviating factors except as outlined above   Current Medications, Allergies, Complete Past Medical History, Past Surgical History, Family History, and Social History were reviewed in [facility] electronic medical record.              Review of Systems  Constitutional: Negative for fever, chills and unexpected weight change.  HENT: Positive for congestion, dental problem and voice change. Negative for ear pain, nosebleeds, postnasal drip, rhinorrhea, sinus pressure, sneezing, sore throat and trouble swallowing.   Eyes: Negative for visual disturbance.  Respiratory: Positive for cough and shortness of breath. Negative for choking.   Cardiovascular: Positive for leg swelling. Negative for chest pain.  Gastrointestinal: Negative for vomiting, abdominal pain and diarrhea.  Genitourinary: Negative for difficulty urinating.  Musculoskeletal: Positive for arthralgias.  Skin: Negative for rash.  Neurological: Negative for tremors, syncope and headaches.  Hematological: Does not bruise/bleed easily.       Objective:   Physical Exam  Obese amb wf nad  Wt Readings from Last 3 Encounters:  12/05/15 279 lb 3.2 oz (126.644 kg)  07/19/14 283 lb 15.2 oz (128.8 kg)    Vital signs reviewed   HEENT: nl dentition, turbinates, and oropharynx. Nl external ear canals without cough reflex   NECK :  without JVD/Nodes/TM/ nl carotid upstrokes bilaterally   LUNGS: no acc muscle use,  Nl contour chest which is clear to A and P bilaterally without cough on insp or exp maneuvers   CV:  RRR  no s3 or murmur or increase  in P2, no edema   ABD:  soft and nontender with nl inspiratory excursion in the supine position. No bruits or organomegaly, bowel sounds nl  MS:  Nl gait/ ext warm without deformities, calf tenderness, cyanosis or clubbing No obvious joint restrictions   SKIN: warm and dry without lesions    NEURO:  alert, approp, nl sensorium with  no motor deficits     CXR PA and Lateral:   12/05/2015 :    I personally reviewed images and agree with radiology impression as follows:   Chronic interstitial lung disease. No active disease.          Assessment & Plan:

## 2015-12-05 NOTE — Patient Instructions (Addendum)
Plan A = Automatic = symbicort 160 Take 2 puffs first thing in am and then another 2 puffs about 12 hours later / tudorza one twice daily   Plan B = Backup Only use your albuterol as a rescue medication to be used if you can't catch your breath by resting or doing a relaxed purse lip breathing pattern.  - The less you use it, the better it will work when you need it. - Ok to use the inhaler up to 2 puffs  every 4 hours if you must but call for appointment if use goes up over your usual need - Don't leave home without it !!  (think of it like the spare tire for your car)     GERD (REFLUX)  is an extremely common cause of respiratory symptoms just like yours , many times with no obvious heartburn at all.    It can be treated with medication, but also with lifestyle changes including elevation of the head of your bed (ideally with 6 inch  bed blocks),  Smoking cessation, avoidance of late meals, excessive alcohol, and avoid fatty foods, chocolate, peppermint, colas, red wine, and acidic juices such as orange juice.  NO MINT OR MENTHOL PRODUCTS SO NO COUGH DROPS  USE SUGARLESS CANDY INSTEAD (Jolley ranchers or Stover's or Life Savers) or even ice chips will also do - the key is to swallow to prevent all throat clearing. NO OIL BASED VITAMINS - use powdered substitutes.  For drainage / throat tickle try take CHLORPHENIRAMINE  4 mg - take one every 4 hours as needed - available over the counter- may cause drowsiness so start with just a bedtime dose or two and see how you tolerate it before trying in daytime    Please remember to go to the  x-ray department downstairs for your tests - we will call you with the results when they are available.     Please schedule a follow up office visit in 6 weeks, call sooner if needed - with formulary in hand

## 2015-12-05 NOTE — Progress Notes (Signed)
Quick Note:  Spoke with pt and notified of results per Dr. Wert. Pt verbalized understanding and denied any questions.  ______ 

## 2015-12-06 ENCOUNTER — Encounter: Payer: Self-pay | Admitting: Internal Medicine

## 2015-12-06 DIAGNOSIS — R058 Other specified cough: Secondary | ICD-10-CM | POA: Insufficient documentation

## 2015-12-06 DIAGNOSIS — R05 Cough: Secondary | ICD-10-CM | POA: Insufficient documentation

## 2015-12-06 NOTE — Assessment & Plan Note (Signed)
The most common causes of chronic cough in immunocompetent adults include the following: upper airway cough syndrome (UACS), previously referred to as postnasal drip syndrome (PNDS), which is caused by variety of rhinosinus conditions; (2) asthma; (3) GERD; (4) chronic bronchitis from cigarette smoking or other inhaled environmental irritants; (5) nonasthmatic eosinophilic bronchitis; and (6) bronchiectasis.   These conditions, singly or in combination, have accounted for up to 94% of the causes of chronic cough in prospective studies.   Other conditions have constituted no >6% of the causes in prospective studies These have included bronchogenic carcinoma, chronic interstitial pneumonia, sarcoidosis, left ventricular failure, ACEI-induced cough, and aspiration from a condition associated with pharyngeal dysfunction.    Chronic cough is often simultaneously caused by more than one condition. A single cause has been found from 38 to 82% of the time, multiple causes from 18 to 62%. Multiply caused cough has been the result of three diseases up to 42% of the time.       Most likely this is just  Classic Upper airway cough syndrome, so named because it's frequently impossible to sort out how much is  CR/sinusitis with freq throat clearing (which can be related to primary GERD)   vs  causing  secondary (" extra esophageal")  GERD from wide swings in gastric pressure that occur with throat clearing, often  promoting self use of mint and menthol lozenges that reduce the lower esophageal sphincter tone and exacerbate the problem further in a cyclical fashion.   These are the same pts (now being labeled as having "irritable larynx syndrome" by some cough centers) who not infrequently have a history of having failed to tolerate ace inhibitors,  dry powder inhalers or biphosphonates or report having atypical reflux symptoms that don't respond to standard doses of PPI , and are easily confused as having aecopd or  asthma flares by even experienced allergists/ pulmonologists.   Given her wt gain she probably is refluxing based on ENT eval but this is likely due to the cough and not the primary problem.  Could also have underlying ILD which we'll explore p [facility]  For now add 1st gen H1 / gerd diet/ work on hfa/dpi and regroup in 6 weeks   Total time devoted to counseling  = 35/4028m review case with pt/ discussion of options/alternatives/ personally creating written instructions  in presence of pt  then going over those specific  Instructions directly with the pt including how to use all of the meds but in particular covering each new medication in detail and the difference between the maintenance/automatic meds and the prns using an action plan format for the latter.

## 2015-12-06 NOTE — Assessment & Plan Note (Signed)
12/05/2015  After extensive coaching HFA effectiveness =    75%  > continue symbicort 160 2bid and tudorza one bid pending return with formulary in hand   When respiratory symptoms begin or become refractory well after a patient reports complete smoking cessation,  Especially when this wasn't the case while they were smoking, a red flag is raised based on the work of Dr Primitivo GauzeFletcher which states:  if you quit smoking when your best day FEV1 is still well preserved it is highly unlikely you will progress to severe disease.  That is to say, once the smoking stops,  the symptoms should not suddenly erupt or markedly worsen.  If so, the differential diagnosis should include  obesity/deconditioning,  LPR/Reflux/Aspiration syndromes,  occult CHF, or  especially side effect of medications commonly used in this population.    Paradoxically the dpi may be contributing to some of her symptoms but she wasn't using either optimally and doesn't know her formulary but would probably be best served with bevespi or stiolto when she returns next ov

## 2015-12-15 DIAGNOSIS — R32 Unspecified urinary incontinence: Secondary | ICD-10-CM | POA: Insufficient documentation

## 2015-12-15 DIAGNOSIS — R152 Fecal urgency: Secondary | ICD-10-CM | POA: Insufficient documentation

## 2015-12-22 ENCOUNTER — Encounter: Payer: Self-pay | Admitting: Internal Medicine

## 2016-01-07 ENCOUNTER — Ambulatory Visit (INDEPENDENT_AMBULATORY_CARE_PROVIDER_SITE_OTHER): Payer: 59 | Admitting: Adult Health

## 2016-01-07 ENCOUNTER — Encounter: Payer: Self-pay | Admitting: Adult Health

## 2016-01-07 ENCOUNTER — Other Ambulatory Visit (INDEPENDENT_AMBULATORY_CARE_PROVIDER_SITE_OTHER): Payer: 59

## 2016-01-07 VITALS — BP 118/70 | HR 78 | Temp 98.8°F | Ht 65.0 in | Wt 274.0 lb

## 2016-01-07 DIAGNOSIS — R05 Cough: Secondary | ICD-10-CM | POA: Diagnosis not present

## 2016-01-07 DIAGNOSIS — R059 Cough, unspecified: Secondary | ICD-10-CM

## 2016-01-07 DIAGNOSIS — R0902 Hypoxemia: Secondary | ICD-10-CM

## 2016-01-07 DIAGNOSIS — R0609 Other forms of dyspnea: Secondary | ICD-10-CM | POA: Diagnosis not present

## 2016-01-07 DIAGNOSIS — J9611 Chronic respiratory failure with hypoxia: Secondary | ICD-10-CM | POA: Insufficient documentation

## 2016-01-07 DIAGNOSIS — J441 Chronic obstructive pulmonary disease with (acute) exacerbation: Secondary | ICD-10-CM

## 2016-01-07 LAB — RHEUMATOID FACTOR: Rhuematoid fact SerPl-aCnc: <10 IU/mL (ref <=14)

## 2016-01-07 LAB — C-REACTIVE PROTEIN: CRP: 2.4 mg/dL (ref 0.5–20.0)

## 2016-01-07 MED ORDER — DOXYCYCLINE HYCLATE 100 MG PO TABS: 100.0000 mg | ORAL_TABLET | Freq: Two times a day (BID) | ORAL | 0 refills | Status: DC

## 2016-01-07 MED ORDER — PREDNISONE 10 MG PO TABS: ORAL_TABLET | ORAL | 0 refills | Status: DC

## 2016-01-07 NOTE — Progress Notes (Signed)
Chart and office note reviewed in detail  > agree with a/p as outlined    

## 2016-01-07 NOTE — Progress Notes (Signed)
Chief Complaint  Patient presents with  . Acute Visit    MW pt. Pt c/o increase in SOB x 1 week. Pt states her SpO2 has been in the mid 80's. Pt c/o prod cough with white mucus. Pt denies CP/tightness.      Tests Spirometry 06/19/15 ([facility])>>>1.79 (79%) CXR 12/05/15>>> interstitial prominence ?r/t chronic ILD 01/07/16 ambulatory desat>>> O2 sats 77 with ambulation on RA, improved to 94% on 2L resting, required 3L for further ambulation.    Past medical hx Past Medical History:  Diagnosis Date  . Anxiety   . Arthritis   . Cholecystitis 07/2014  . COPD (chronic obstructive pulmonary disease) (HCC)   . Diabetes mellitus without complication (HCC)    type 2   . Fracture 2013 ?   left leg  . GERD (gastroesophageal reflux disease)    hx of gerd  . Headache   . Hx: UTI (urinary tract infection)   . OA (osteoarthritis) of knee   . Pneumonia    hx of viral pna years ago  . Shortness of breath dyspnea   . Sleep apnea    uses cpap  . Urgency of urination      Past surgical hx, Allergies, Family hx, Social hx all reviewed.  Current Outpatient Prescriptions on File Prior to Visit  Medication Sig  . Aclidinium Bromide 400 MCG/ACT AEPB Inhale 1 puff into the lungs 2 (two) times daily.   Marland Kitchen albuterol (PROVENTIL HFA;VENTOLIN HFA) 108 (90 BASE) MCG/ACT inhaler Inhale 2 puffs into the lungs every 6 (six) hours as needed for wheezing or shortness of breath.  . ALPHA LIPOIC ACID PO Take 1 capsule by mouth daily.  Marland Kitchen aspirin EC 81 MG tablet Take 81 mg by mouth at bedtime.  . ASTRAGALUS PO Take 1 tablet by mouth every evening.  . Cholecalciferol (VITAMIN D) 2000 UNITS CAPS Take 2,000 Units by mouth every evening.  . cholestyramine light (PREVALITE) 4 g packet Take 4 g by mouth as directed.  . Coenzyme Q10 (CO Q 10 PO) Take 1 tablet by mouth 2 (two) times daily.   Marland Kitchen conjugated estrogens (PREMARIN) vaginal cream Place 1 Applicatorful vaginally every other day.  . escitalopram  (LEXAPRO) 10 MG tablet Take 10 mg by mouth at bedtime.  . gabapentin (NEURONTIN) 400 MG capsule Take 400 mg by mouth at bedtime.  . simvastatin (ZOCOR) 10 MG tablet Take 10 mg by mouth every evening.  . SYMBICORT 160-4.5 MCG/ACT inhaler Inhale 2 puffs into the lungs 2 (two) times daily.   No current facility-administered medications on file prior to visit.      Vital Signs BP 118/70 (BP Location: Right Arm, Cuff Size: Normal)   Pulse 78   Temp 98.8 F (37.1 C) (Oral)   Ht  (1.651 m)   Wt 274 lb (124.3 kg)   SpO2 91%   BMI 45.60 kg/m   History of Present Illness Lindsay Brady is a 65 y.o. female former smoker (quit 1998) with hx chronic cough, COPD, OSA on CPAP, DM, GERD.  She recently saw Dr. Sherene Sires 7/7 with cough.  She was treated with GERD rx and continued on symbicort, tudorza.   She returns today for acute visit with c/o 1 week hx increased SOB, cough.  She purchased a pulse oximeter and has had O2 sats in the low 80's.    States her cough and  hoarseness much improved after last visit with dr. Ivor Reining GERD rx.  Followed previously by [facility]  with dx emphysema. Pt states no other sig w/u was done by them that she knows of.  Switched to [employer] when she moved to [place] but also because she was not happy with her care.  Today she c/0 SOB, mostly with exertion.  Takes very little activity to make her significantly SOB.  Has to rest after walking to the bathroom.    Has 60 pack year smoking hx.  Mother had ILD and RA    Physical Exam  General - pleasant but anxious female, NAD   ENT - No sinus tenderness, no oral exudate, no LAN Cardiac - s1s2 regular, no murmur Chest - resps even non labored on RA, few exp wheeze, few scattered dry crackles  Back - No focal tenderness Abd - Soft, non-tender Ext - scant BLE edema  Neuro - Normal strength Skin - No rashes Psych - normal mood, and behavior   Assessment/Plan  Dyspnea, cough - cough and hoarseness much  improved with addition GERD rx at last appt.  Hypoxia  - seems outpt of proportion to lung disease.  Needs cardiac w/u as well.   AECOPD   PLAN -   Patient Instructions  Prednisone taper  Doxycyline x 7 days  Continue symbicort, tudorza  albuterol as needed  Will arrange home O2  Will check some labs today  Will arrange an echocardiogram and cardiology referral  Follow up with Dr. Sherene SiresWert in 2 weeks     Dirk DressKaty Torie Priebe, NP 01/07/2016  10:42 AM

## 2016-01-07 NOTE — Patient Instructions (Addendum)
Prednisone taper  Doxycyline x 7 days  Continue symbicort, tudorza  albuterol as needed  Will arrange home O2  Will check some labs today  Will arrange an echocardiogram and cardiology referral  Follow up with Dr. Sherene SiresWert in 2 weeks

## 2016-01-08 LAB — ANGIOTENSIN CONVERTING ENZYME: ANGIOTENSIN-CONVERTING ENZYME: 38 U/L (ref 8–52)

## 2016-01-08 LAB — ANTI-NUCLEAR AB-TITER (ANA TITER)

## 2016-01-08 LAB — ANA: Anti Nuclear Antibody(ANA): POSITIVE — AB

## 2016-01-08 LAB — ANCA SCREEN W REFLEX TITER: ANCA SCREEN: NEGATIVE

## 2016-01-09 ENCOUNTER — Telehealth: Payer: Self-pay | Admitting: Adult Health

## 2016-01-09 NOTE — Telephone Encounter (Signed)
Patient read her instructions incorrectly on how to take the Prednisone.  Patient took 4 tablets at 5am yesterday, 4 tablets at 3pm yesterday, 4 tablets at 5am this AM, and 4 tablets at 3pm this afternoon, a total of 16 tablets in 1 1/2 days.   Also,  Patient states that [facility] her oxygen/cpap machine.  She said that the adapter she is requesting has a heated hose.  Please advise.  Prednisone taper  Doxycyline x 7 days  Continue symbicort, tudorza  albuterol as needed  Will arrange home O2  Will check some labs today  Will arrange an echocardiogram and cardiology

## 2016-01-09 NOTE — Telephone Encounter (Signed)
Pt just figured out she is taking to much predisone

## 2016-01-09 NOTE — Telephone Encounter (Signed)
Fine with me

## 2016-01-09 NOTE — Telephone Encounter (Signed)
Please clarify on how patient should take Prednisone since she took incorrect dosing.

## 2016-01-09 NOTE — Telephone Encounter (Signed)
Per MW- take 4 x 2 days, 2 x 2 days, 1 x 2 days and stop  She has 14 tabs so this is perfect  She repeated instructions back to be and nothing further needed

## 2016-01-15 ENCOUNTER — Ambulatory Visit (HOSPITAL_COMMUNITY): Payer: 59

## 2016-01-15 ENCOUNTER — Telehealth: Payer: Self-pay | Admitting: Internal Medicine

## 2016-01-15 DIAGNOSIS — R0989 Other specified symptoms and signs involving the circulatory and respiratory systems: Secondary | ICD-10-CM

## 2016-01-15 NOTE — Telephone Encounter (Signed)
She should contact the 02 company and they should be able to accommodate her immediate needs and if they can't give us a name and contact number to whomever she speaks.  Ok to postpone ov as long as breathing is baseline unrelated to the 02 issue above

## 2016-01-15 NOTE — Telephone Encounter (Signed)
Called and spoke with pt and she stated that the oxygen tanks that she has not are not lasting but an hour or so for her.  She stated that her daughter has been admitted to ICU and she has been staying at the hospital for long periods of time and the tanks are not lasting her.    Pt has an appt with MW tomorrow and wanted to see if she needs to keep this appt--she stated that she really would rather not keep the appt since her daughter is there in the hospital.    MW please advise about her oxygen tanks and the appt.  thanks

## 2016-01-15 NOTE — Telephone Encounter (Signed)
Called and spoke to pt. Informed her of the recs per MW. Pt states her breathing is at baseline and is doing well. Pt aware to call back to reschedule when available. Nothing further needed at this time.

## 2016-01-16 ENCOUNTER — Ambulatory Visit: Payer: 59 | Admitting: Internal Medicine

## 2016-01-30 ENCOUNTER — Telehealth: Payer: Self-pay | Admitting: Internal Medicine

## 2016-01-30 NOTE — Telephone Encounter (Signed)
Returned call to patient-pt reports she had to cancel her echo on 8/17 and was wondering if she needed her echo completed prior to appt with MD Hilty.  Advised since appt is soon (9/5) that unfortanately it is not possible to get it done today or Monday (holiday).  Advised to keep appt with MD Hilty on Tuesday and can reschedule echo from there if recommended by MD (previously ordered by pulmonologist).

## 2016-01-30 NOTE — Telephone Encounter (Signed)
Pt is scheduled to see Dr Rennis GoldenHilty as a new pt on Tuesday. She was supposed to have had echo,but she to cancel it. Should she have the echo before she see Dr Rennis GoldenHilty? Please let her know something today if possible.

## 2016-01-31 NOTE — Telephone Encounter (Signed)
I can see her first and order what studies I think are appropriate. She should keep the appointment.  Dr. HRexene Edison

## 2016-02-03 ENCOUNTER — Telehealth: Payer: Self-pay | Admitting: Internal Medicine

## 2016-02-03 ENCOUNTER — Ambulatory Visit: Payer: 59 | Admitting: Internal Medicine

## 2016-02-03 NOTE — Telephone Encounter (Signed)
Closed encounter °

## 2016-02-10 ENCOUNTER — Other Ambulatory Visit (INDEPENDENT_AMBULATORY_CARE_PROVIDER_SITE_OTHER): Payer: 59

## 2016-02-10 ENCOUNTER — Encounter: Payer: Self-pay | Admitting: Internal Medicine

## 2016-02-10 ENCOUNTER — Ambulatory Visit (INDEPENDENT_AMBULATORY_CARE_PROVIDER_SITE_OTHER)
Admission: RE | Admit: 2016-02-10 | Discharge: 2016-02-10 | Disposition: A | Discharge status: Home / Self Care | Payer: 59 | Source: Ambulatory Visit | Attending: Internal Medicine | Admitting: Internal Medicine

## 2016-02-10 ENCOUNTER — Ambulatory Visit (INDEPENDENT_AMBULATORY_CARE_PROVIDER_SITE_OTHER): Payer: 59 | Admitting: Internal Medicine

## 2016-02-10 VITALS — BP 120/70 | HR 75 | Ht 65.0 in | Wt 276.0 lb

## 2016-02-10 DIAGNOSIS — J9611 Chronic respiratory failure with hypoxia: Secondary | ICD-10-CM | POA: Diagnosis not present

## 2016-02-10 DIAGNOSIS — J849 Interstitial pulmonary disease, unspecified: Secondary | ICD-10-CM

## 2016-02-10 DIAGNOSIS — R05 Cough: Secondary | ICD-10-CM

## 2016-02-10 DIAGNOSIS — J449 Chronic obstructive pulmonary disease, unspecified: Secondary | ICD-10-CM

## 2016-02-10 DIAGNOSIS — R059 Cough, unspecified: Secondary | ICD-10-CM

## 2016-02-10 LAB — SEDIMENTATION RATE: Sed Rate: 86 mm/hr — ABNORMAL HIGH (ref 0–30)

## 2016-02-10 MED ORDER — PREDNISONE 10 MG PO TABS: ORAL_TABLET | ORAL | 0 refills | Status: DC

## 2016-02-10 NOTE — Progress Notes (Signed)
Subjective:    Patient ID: Lindsay Brady, female    DOB: [birth year],    MRN: [id number]    Brief patient profile:  64 yowf quit smoking 1998 at wt around 200 with some am cough and did fine s rx but in 2011 noted doe across parking lot where worked and dx as copd by [employer] and self referred to pulmonary clinic 12/05/2015 as shifting all her care to [place] with new dx of ILD 02/10/2016    History of Present Illness  12/05/2015 1st  Pulmonary office visit/ Martha Soltys   Chief Complaint  Patient presents with  . Pulmonary Consult    Self referral. Pt states dxed with Emphysema in 2011. She has been seen at [facility] in the past. She c/o hoarseness and increased cough for the past yr.  Cough is prod with white to tan sputum.  She also c/o SOB with walking long distances and up stairs. Hot weather tends to make her breathing worse. She uses ventolin 2 x per day on average.   indolent onset doe x 6 y better on tudorza and spiriva (mouth dry) and no change on symbicort  Needs saba at least twice daily  Already did rehab with wt = ? But did well  Doe = MMRC3 = can't walk 100 yards even at a slow pace at a flat grade s stopping due to sob   Cough p stopped smoking got worse then better and resolved until one year prior to OV  And present daily since but never while on cpap / neg w/u reflux by GI but ent disagreed  rec Plan A = Automatic = symbicort 160 Take 2 puffs first thing in am and then another 2 puffs about 12 hours later / tudorza one twice daily  Plan B = Backup Only use your albuterol as a rescue medication GERD diet  For drainage / throat tickle try take CHLORPHENIRAMINE  4 mg - take one every 4 hours as needed     01/07/16 acute w/in with NP breathing a lot worse  Prednisone taper  Doxycyline x 7 days  Continue symbicort, tudorza  albuterol as needed  Will arrange home O2  Will check some labs today  Will arrange an echocardiogram and cardiology referral     02/10/2016   Extended f/u ov/Marypat Kimmet re: COPD 0/ probable ILD ? Macrodantin related ? When last dose ?  Chief Complaint  Patient presents with  . Follow-up    Cough and SOB had improved on pred and doxy and then worsened a few days after finished meds. Cough is prod and mucus gets hung in her throat. She has not needed to use albuterol since starting o2.   doe x MMRC2 = can't walk a nl pace on a flat grade s sob but does fine slow and flat eg  shopping on 02/ main issue is hacking cough and sensation of globus day >> noct  No obvious day to day or daytime variability or assoc excess/ purulent sputum or mucus plugs or hemoptysis or cp or chest tightness, subjective wheeze or overt sinus or hb symptoms. No unusual exp hx or h/o childhood pna/ asthma or knowledge of premature birth.  Sleeping ok without nocturnal  or early am exacerbation  of respiratory  c/o's or need for noct saba. Also denies any obvious fluctuation of symptoms with weather or environmental changes or other aggravating or alleviating factors except as outlined above   Current Medications, Allergies, Complete Past Medical  History, Past Surgical History, Family History, and Social History were reviewed in [facility] electronic medical record.  ROS  The following are not active complaints unless bolded sore throat, dysphagia, dental problems, itching, sneezing,  nasal congestion or excess/ purulent secretions, ear ache,   fever, chills, sweats, unintended wt loss, classically pleuritic or exertional cp,  orthopnea pnd or leg swelling, presyncope, palpitations, abdominal pain, anorexia, nausea, vomiting, diarrhea  or change in bowel or bladder habits, change in stools or urine, dysuria,hematuria,  rash, arthralgias, visual complaints, headache, numbness, weakness or ataxia or problems with walking or coordination,  change in mood/affect or memory.                     Objective:   Physical Exam  Obese amb wf nad     Wt Readings from  Last 3 Encounters:  02/10/16 276 lb (125.2 kg)  01/07/16 274 lb (124.3 kg)  12/05/15 279 lb 3.2 oz (126.6 kg)    Vital signs reviewed  - sats 94% on 2lpm on arrival   HEENT: nl dentition, turbinates, and oropharynx. Nl external ear canals without cough reflex   NECK :  without JVD/Nodes/TM/ nl carotid upstrokes bilaterally   LUNGS: no acc muscle use,  Nl contour chest which is clear to A and P bilaterally without cough on insp or exp maneuvers   CV:  RRR  no s3 or murmur or increase in P2, no edema   ABD:  soft and nontender with nl inspiratory excursion in the supine position. No bruits or organomegaly, bowel sounds nl  MS:  Nl gait/ ext warm without deformities, calf tenderness, cyanosis or clubbing No obvious joint restrictions   SKIN: warm and dry without lesions    NEURO:  alert, approp, nl sensorium with  no motor deficits    CXR PA and Lateral:   02/10/2016 :    I personally reviewed images and agree with radiology impression as follows:   Stable chronic interstitial lung disease.  No acute findings.       Lab Results  Component Value Date   ESRSEDRATE 86 (H) 02/10/2016         Assessment & Plan:

## 2016-02-10 NOTE — Patient Instructions (Addendum)
Call us with the dates you took macrodantin and do not take this again   Please remember to go to the lab and x-ray department downstairs for your tests - we will call you with the results when they are available.     Prednisone 10 mg take  4 each am x 2 days,   2 each am x 2 days,  1 each am x 2 days and stop   Stop tudorza   Please schedule a follow up office visit in 4 weeks, sooner if needed

## 2016-02-11 ENCOUNTER — Ambulatory Visit (HOSPITAL_COMMUNITY): Payer: Medicare Other | Attending: Cardiovascular Disease

## 2016-02-11 ENCOUNTER — Telehealth: Payer: Self-pay | Admitting: Internal Medicine

## 2016-02-11 ENCOUNTER — Encounter: Payer: Self-pay | Admitting: Internal Medicine

## 2016-02-11 ENCOUNTER — Other Ambulatory Visit: Payer: Self-pay

## 2016-02-11 DIAGNOSIS — R0609 Other forms of dyspnea: Secondary | ICD-10-CM | POA: Diagnosis not present

## 2016-02-11 DIAGNOSIS — Z87891 Personal history of nicotine dependence: Secondary | ICD-10-CM | POA: Insufficient documentation

## 2016-02-11 DIAGNOSIS — R0902 Hypoxemia: Secondary | ICD-10-CM

## 2016-02-11 DIAGNOSIS — J849 Interstitial pulmonary disease, unspecified: Secondary | ICD-10-CM | POA: Insufficient documentation

## 2016-02-11 DIAGNOSIS — J449 Chronic obstructive pulmonary disease, unspecified: Secondary | ICD-10-CM | POA: Insufficient documentation

## 2016-02-11 DIAGNOSIS — R0600 Dyspnea, unspecified: Secondary | ICD-10-CM | POA: Diagnosis present

## 2016-02-11 DIAGNOSIS — I517 Cardiomegaly: Secondary | ICD-10-CM | POA: Diagnosis not present

## 2016-02-11 NOTE — Assessment & Plan Note (Signed)
Trial of gerd diet/ 1st gen H1 12/05/2015 >>> - trial off tudorza 02/10/2016 >>>  May be partly responsive to prednisone which raises the issue whether she has cough variant asthma or eos dz or ILD related to macrodantin vs lupus but for now clearly doesn't need the tudorza and will try rechallenge with 12 day pred taper while trying to sort out while continuing gerd rx

## 2016-02-11 NOTE — Telephone Encounter (Signed)
Please remember to go to the lab and x-ray department downstairs for your tests - we will call you with the results when they are available.  Call us with the dates you took macrodantin and do not take this again  Please remember to go to the lab and x-ray department downstairs for your tests - we will call you with the results when they are available.  Prednisone 10 mg take 4 each am x 2 days, 2 each am x 2 days, 1 each am x 2 days and stop  Stop tudorza  Please schedule a follow up office visit in 4 weeks, sooner if needed   ----------------- Patient states she took Macrodantin: 06-16-15,11-21-15, and 12-17-15.7 day course 100mg  BID

## 2016-02-11 NOTE — Assessment & Plan Note (Signed)
Body mass index is 45.93 kg/m.  No results found for: TSH   Contributing to gerd tendency/ doe/ concern may worsen on steroids if becomes dependent, even in short term/ reviewed the need and the process to achieve and maintain neg calorie balance > defer f/u primary care including intermittently monitoring thyroid status

## 2016-02-11 NOTE — Assessment & Plan Note (Signed)
PFTs  12/12/14  FEV1  1.85 (81) ratio 88 PFTs  06/19/15  FEV1  1.79(79%) ratio 83  12/05/2015  After extensive coaching HFA effectiveness =    75%  > continue symbicort 160 2bid and tudorza one bid pending return with formulary in hand  - d/c tuodorza 02/10/2016 due to cough   Really very little to support dx of copd at this point and tudorza not needed, could be that doesn't even have AB but for now rec continue the symbicort

## 2016-02-11 NOTE — Telephone Encounter (Signed)
noted 

## 2016-02-11 NOTE — Progress Notes (Signed)
Spoke with pt and notified of results per Dr. Wert. Pt verbalized understanding and denied any questions. 

## 2016-02-11 NOTE — Assessment & Plan Note (Addendum)
Last macrodantin exposure = ??? ANA Pos 01/07/16 homogeneous > anti dna titer 02/10/2016 >>>   - ESR 02/10/2016 = 86   She clearly is ILD/ not copd and needs to complete the w/u and if in fact this is some form of lupus needs rheum eval next   I had an extended discussion with the patient reviewing all relevant studies completed to date and  lasting 25 min / 40 min ov discussing dx and management of a new and previously not detected dx and explaining why I don't think this is copd at all and probably never was  Each maintenance medication was reviewed in detail including most importantly the difference between maintenance and prns and under what circumstances the prns are to be triggered using an action plan format that is not reflected in the computer generated alphabetically organized AVS.    Please see instructions for details which were reviewed in writing and the patient given a copy highlighting the part that I personally wrote and discussed at today's ov.

## 2016-02-11 NOTE — Assessment & Plan Note (Signed)
SATURATION QUALIFICATIONS:  02/10/2016  Patient Saturations on Room Air at Rest = 90% Patient Saturations on Room Air while Ambulating = 83% Patient Saturations on 3 Liters of oxygen while Ambulating = 90%  Adequate control on present rx, reviewed > no change in rx needed  = 3lpm 24/7 for now

## 2016-02-12 LAB — ANTI-DNA ANTIBODY, DOUBLE-STRANDED: ds DNA Ab: 1 IU/mL

## 2016-02-12 NOTE — Progress Notes (Signed)
Spoke with pt and notified of results per Dr. Wert. Pt verbalized understanding and denied any questions. 

## 2016-03-04 ENCOUNTER — Ambulatory Visit (INDEPENDENT_AMBULATORY_CARE_PROVIDER_SITE_OTHER): Payer: Medicare Other | Admitting: Internal Medicine

## 2016-03-04 ENCOUNTER — Encounter: Payer: Self-pay | Admitting: Internal Medicine

## 2016-03-04 VITALS — BP 152/69 | HR 76 | Ht 65.0 in | Wt 278.6 lb

## 2016-03-04 DIAGNOSIS — J849 Interstitial pulmonary disease, unspecified: Secondary | ICD-10-CM | POA: Diagnosis not present

## 2016-03-04 DIAGNOSIS — G4733 Obstructive sleep apnea (adult) (pediatric): Secondary | ICD-10-CM

## 2016-03-04 DIAGNOSIS — J9611 Chronic respiratory failure with hypoxia: Secondary | ICD-10-CM

## 2016-03-04 DIAGNOSIS — I4510 Unspecified right bundle-branch block: Secondary | ICD-10-CM

## 2016-03-04 DIAGNOSIS — Z9989 Dependence on other enabling machines and devices: Secondary | ICD-10-CM

## 2016-03-04 NOTE — Patient Instructions (Signed)
Medication Instructions:  Your physician recommends that you continue on your current medications as directed. Please refer to the Current Medication list given to you today.  Labwork: None   Testing/Procedures: None   Follow-Up: Your physician recommends that you schedule a follow-up appointment in: AS NEEDED  Any Other Special Instructions Will Be Listed Below (If Applicable).     If you need a refill on your cardiac medications before your next appointment, please call your pharmacy.  

## 2016-03-04 NOTE — Progress Notes (Signed)
OFFICE NOTE  Chief Complaint:  Dyspnea  Primary Care Physician: Lindsay Brady,Lindsay RENEE, MD  HPI:  Lindsay Brady is a 65 y.o. female who is the mother of a patient of mine, Lindsay Brady. She presents at the consult request of Dr. Sherene SiresWert for the evaluation of acute on chronic dyspnea. Her past medical history significant for COPD with prior tobacco abuse however she quit in the late 1990s. She also has interstitial lung disease without a clear etiology. She reported using nitrofurantoin 2 or 3 times in the past but does not report chronic use of this antibiotic.Recently she had acute decompensation of her shortness of breath and started on oxyg This is significantly improved her symptoms. She was referred for an echocardiogram. Echo was performed on9/13/2017 which showed an EF of 60-65%, Mild left atrial enlargement and normal diastolic function. Wall motion was normal. There is mild to moderate pulmonary hypertension with an RVSP of 48 mmHg. EKG today shows right bundle branch block consistent with pulmonary disease pattern. Symptomatically she also reports some lower extremity edema particularly in the left leg. She specifically denies any chest pain, particularly with exertion.  PMHx:  Past Medical History:  Diagnosis Date  . Anxiety   . Arthritis   . Cholecystitis 07/2014  . COPD (chronic obstructive pulmonary disease) (HCC)   . Diabetes mellitus without complication (HCC)    type 2   . Fracture 2013 ?   left leg  . GERD (gastroesophageal reflux disease)    hx of gerd  . Headache   . Hx: UTI (urinary tract infection)   . OA (osteoarthritis) of knee   . Pneumonia    hx of viral pna years ago  . Shortness of breath dyspnea   . Sleep apnea    uses cpap  . Urgency of urination     Past Surgical History:  Procedure Laterality Date  . ABDOMINAL HYSTERECTOMY    . CARPAL TUNNEL RELEASE    . CHOLECYSTECTOMY N/A 07/19/2014   Procedure: LAPAROSCOPIC CHOLECYSTECTOMY WITH INTRAOPERATIVE  CHOLANGIOGRAM;  Surgeon: Violeta GelinasBurke Thompson, MD;  [facility];  Service: General;  Laterality: N/A;  . COLONOSCOPY  ?   2011  . KNEE ARTHROSCOPY    . TONSILLECTOMY      FAMHx:  Family History  Problem Relation Age of Onset  . Stroke Mother   . Lung cancer Father     smoked  . Colon cancer Father     SOCHx:   reports that she quit smoking about 19 years ago. Her smoking use included Cigarettes. She has a 60.00 pack-year smoking history. She has never used smokeless tobacco. She reports that she does not drink alcohol or use drugs.  ALLERGIES:  Allergies  Allergen Reactions  . Erythromycin Other (See Comments)    Pain in stomach   . Bactrim [Sulfamethoxazole-Trimethoprim] Rash    ROS: Pertinent items noted in HPI and remainder of comprehensive ROS otherwise negative.  HOME MEDS: Current Outpatient Prescriptions on File Prior to Visit  Medication Sig Dispense Refill  . albuterol (PROVENTIL HFA;VENTOLIN HFA) 108 (90 BASE) MCG/ACT inhaler Inhale 2 puffs into the lungs every 6 (six) hours as needed for wheezing or shortness of breath.    . ALPHA LIPOIC ACID PO Take 1 capsule by mouth daily.    Marland Kitchen. aspirin EC 81 MG tablet Take 81 mg by mouth at bedtime.    . ASTRAGALUS PO Take 1 tablet by mouth every evening.    . budesonide-formoterol (SYMBICORT) 160-4.5 MCG/ACT inhaler  Inhale 2 puffs into the lungs 2 (two) times daily.     . chlorpheniramine (CHLOR-TRIMETON) 4 MG tablet Take 4 mg by mouth at bedtime.    . Cholecalciferol (VITAMIN D) 2000 UNITS CAPS Take 2,000 Units by mouth every evening.    . cholestyramine light (PREVALITE) 4 g packet Take 4 g by mouth as directed.    . Coenzyme Q10 (CO Q 10 PO) Take 1 tablet by mouth 2 (two) times daily.     Marland Kitchen conjugated estrogens (PREMARIN) vaginal cream Place 1 Applicatorful vaginally every other day.    . escitalopram (LEXAPRO) 10 MG tablet Take 10 mg by mouth at bedtime.    . gabapentin (NEURONTIN) 400 MG capsule Take 400 mg by mouth at  bedtime.    . OXYGEN 2lpm with rest and 3lpm with exertion [facility]    . predniSONE (DELTASONE) 10 MG tablet Take  4 each am x 2 days,   2 each am x 2 days,  1 each am x 2 days and stop 14 tablet 0  . simvastatin (ZOCOR) 10 MG tablet Take 10 mg by mouth every evening.     No current facility-administered medications on file prior to visit.     LABS/IMAGING: No results found for this or any previous visit (from the past 48 hour(s)). No results found.  WEIGHTS: Wt Readings from Last 3 Encounters:  03/04/16 278 lb 9.6 oz (126.4 kg)  02/10/16 276 lb (125.2 kg)  01/07/16 274 lb (124.3 kg)    VITALS: BP (!) 152/69   Pulse 76   Ht 5\' 5"  (1.651 m)   Wt 278 lb 9.6 oz (126.4 kg)   BMI 46.36 kg/m   EXAM: General appearance: alert, no distress and morbidly obese Neck: no carotid bruit and no JVD Lungs: diminished breath sounds bilaterally and rales bibasilar Heart: regular rate and rhythm Abdomen: soft, non-tender; bowel sounds normal; no masses,  no organomegaly Extremities: edema trace RLE edema Pulses: 2+ and symmetric Skin: Skin color, texture, turgor normal. No rashes or lesions Neurologic: Grossly normal Psych: Pleasant  EKG: NSR at 76, RBBB  ASSESSMENT: 1. Dyspnea - secondary to COPD/ILD, on O2 2. Normal systolic and diastolic function by echo 3. Mild to moderate pulmonary hypertension 4. RBBB 5. OSA on CPAP  PLAN: 1.   Mrs. Imhof had a recent decompensation of her dyspnea and has improved, but is requiring oxygen. Echo is reassuring showing normal systolic and diastolic function, with normal wall motion. EKG shows RBBB, consistent with a pulmonary disease pattern. There is no complaint of chest pain. There is pulmonary hypertension, which is consistent with her history of COPD, ILD, morbid obesity and sleep apnea. I'm not sure it requires treatment at this time.  No further cardiac work-up is recommended at this point. Follow-up with me as needed.  Chrystie Nose,  MD, [facility] Attending Cardiologist [facility]  Lisette Abu Iliza Blankenbeckler 03/04/2016, 4:01 PM

## 2016-03-09 ENCOUNTER — Ambulatory Visit: Payer: 59 | Admitting: Internal Medicine

## 2016-03-10 DIAGNOSIS — I272 Pulmonary hypertension, unspecified: Secondary | ICD-10-CM | POA: Insufficient documentation

## 2016-03-11 ENCOUNTER — Ambulatory Visit: Payer: 59 | Admitting: Internal Medicine

## 2016-03-14 IMAGING — CR DG CHEST 2V
2 series · 2 of 2 positions shown · non-contrast
Comparison: None.

CLINICAL DATA: Per EMS: Pt from home for sudden onset of right side
chest tightness, pt reports increased sob and also nausea.
Non-Smoker.

EXAM:
CHEST  2 VIEW

[chest pa]
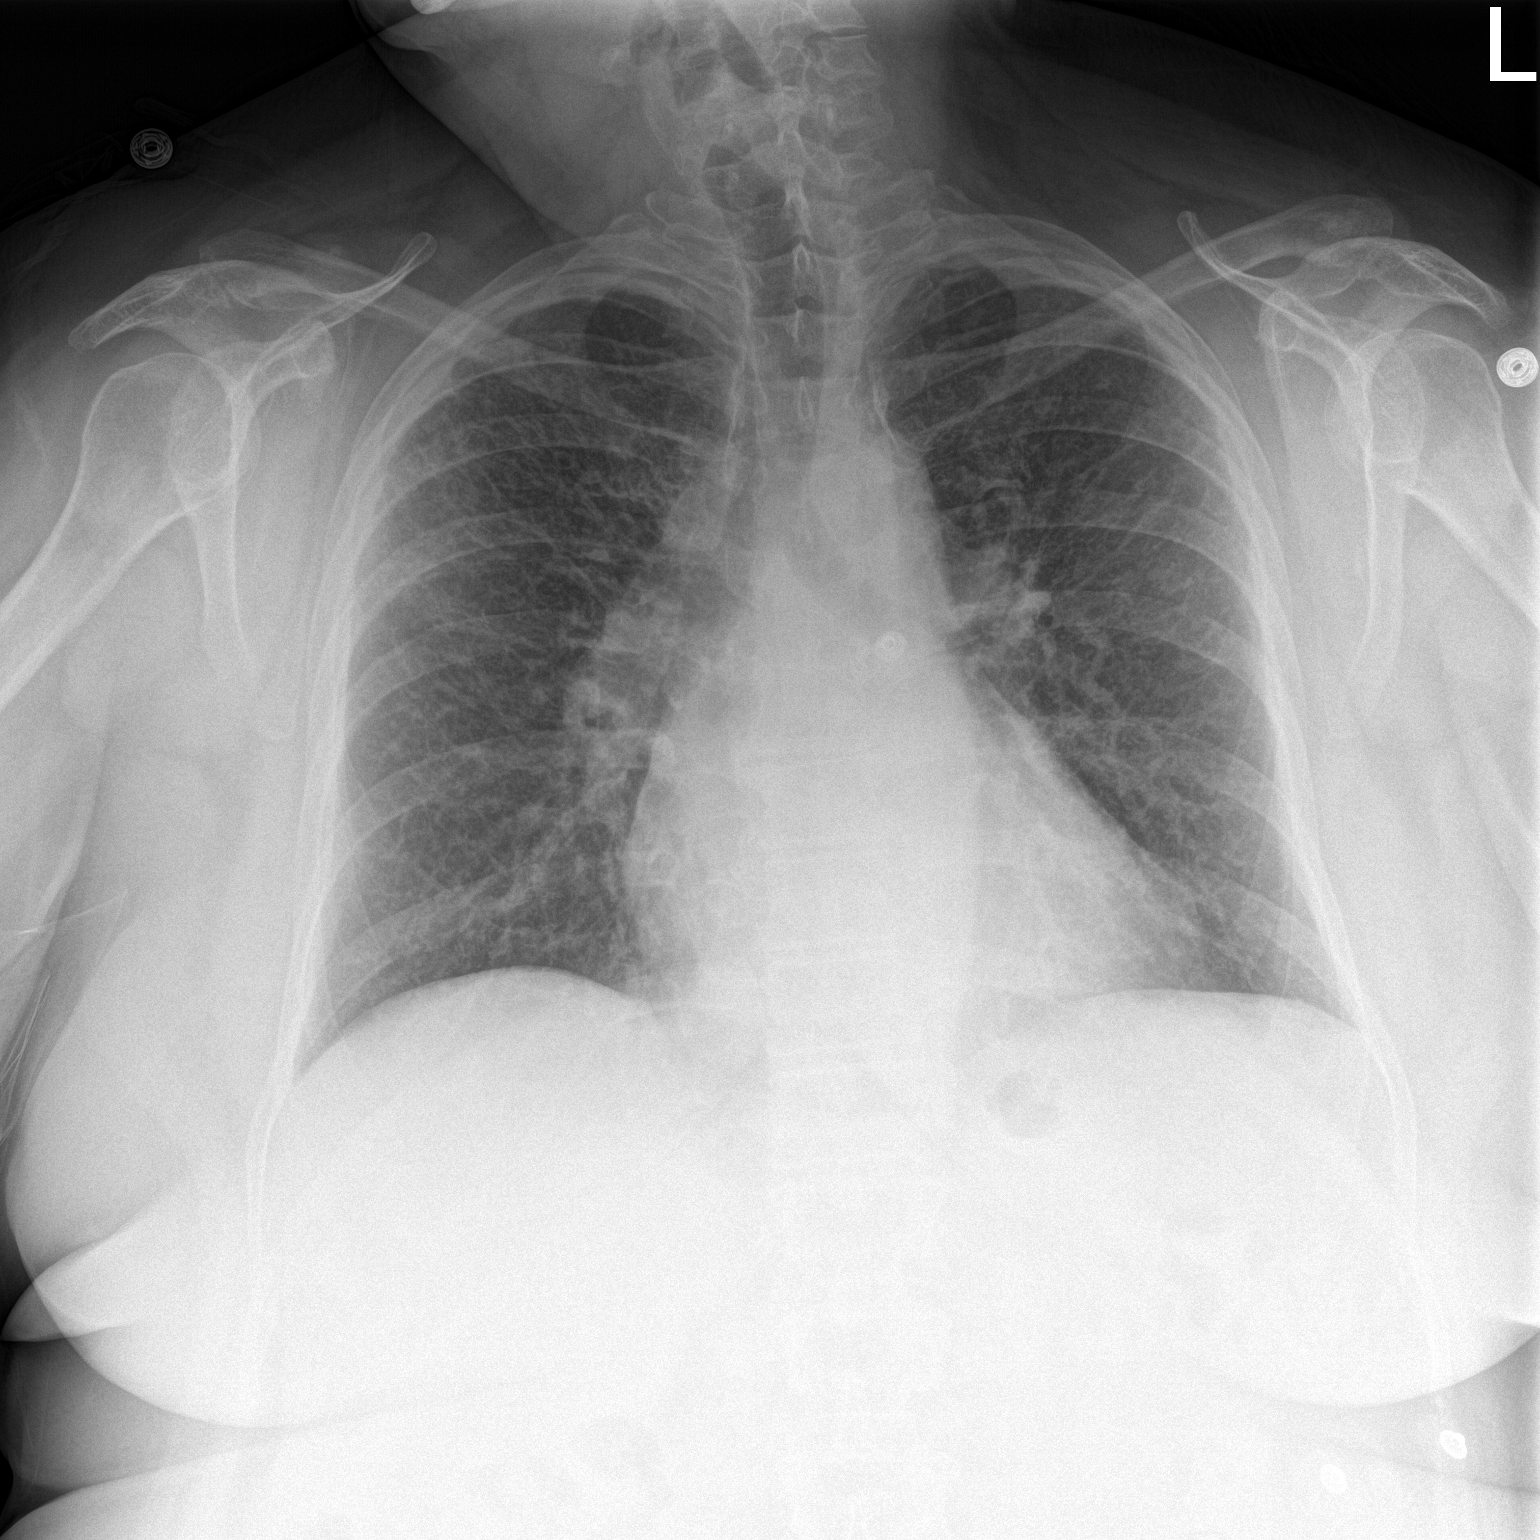

[chest lat]
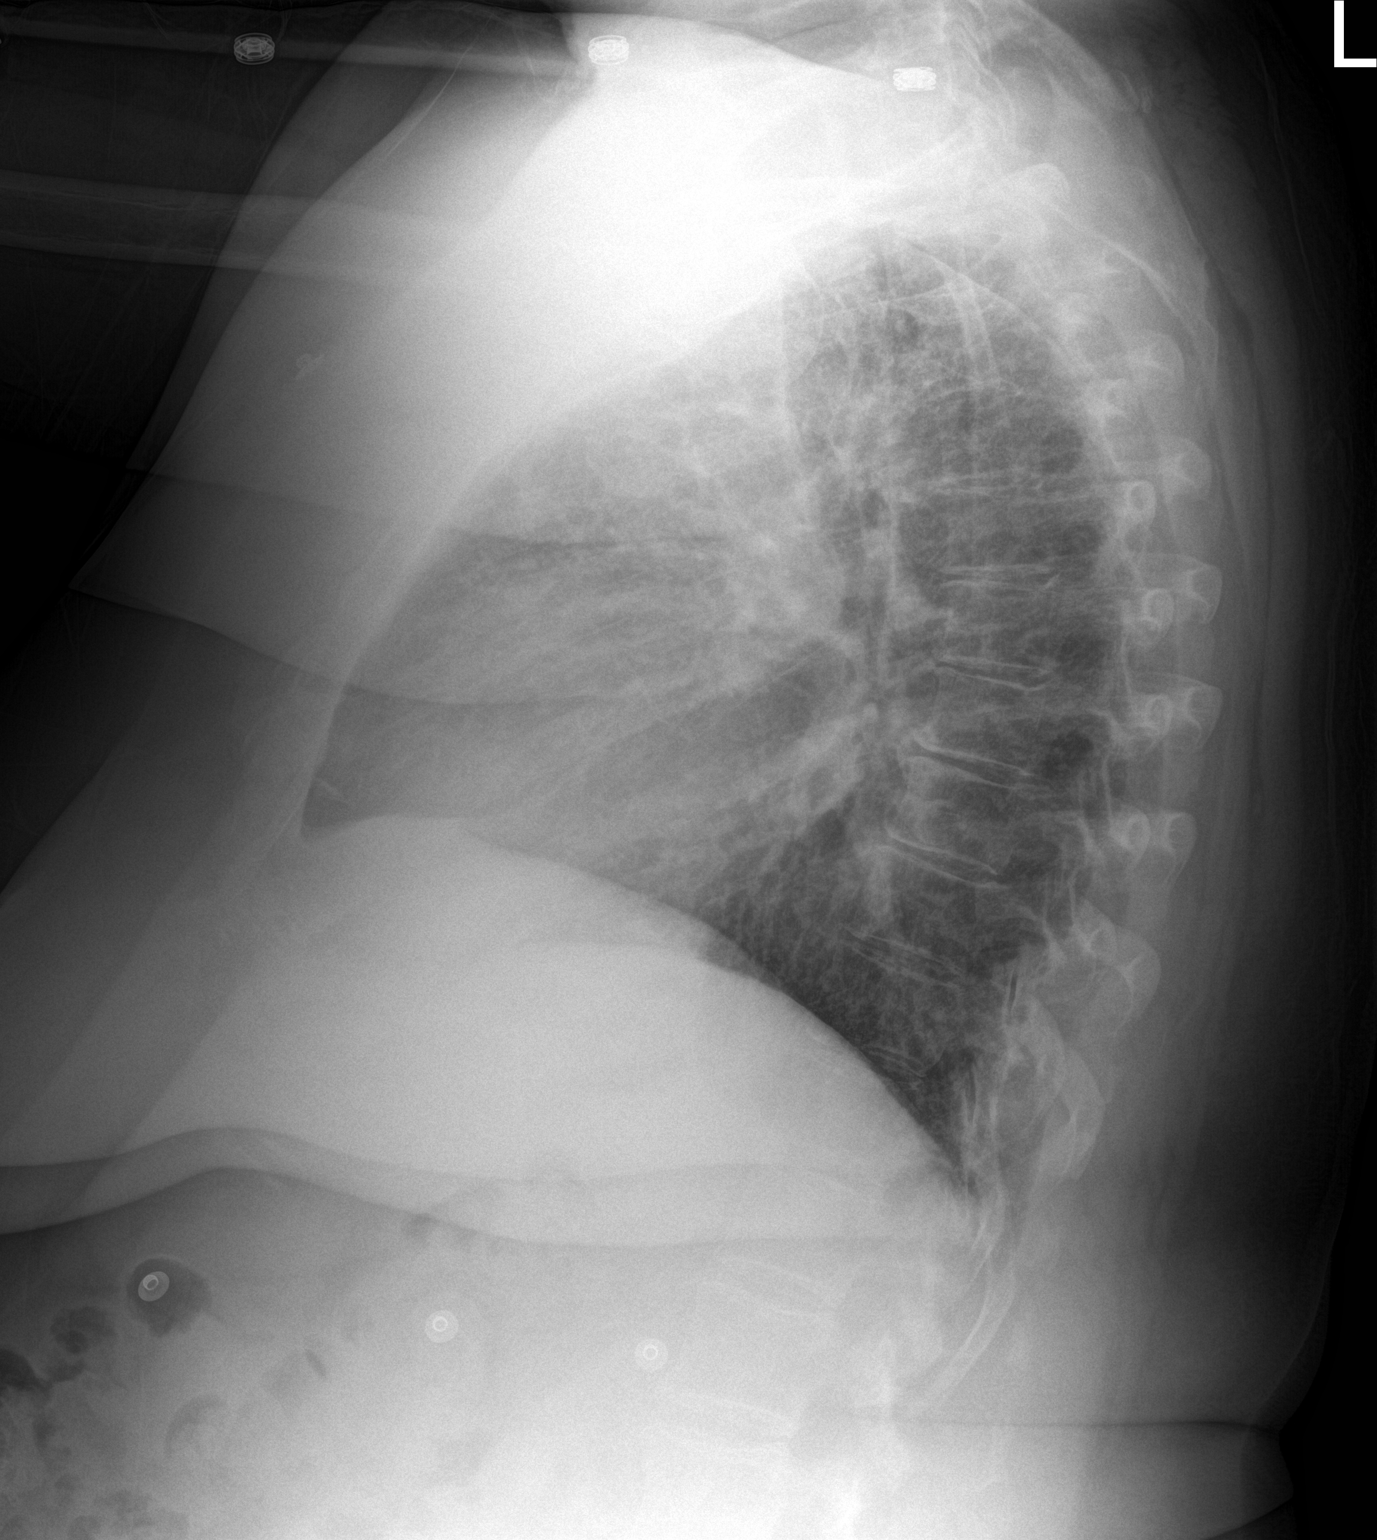

[2 of 2 positions shown; findings below may reference images not displayed]

FINDINGS: Cardiac silhouette normal in size and configuration. No mediastinal
or hilar masses or convincing adenopathy.

Mild bilateral interstitial thickening. There are additional
reticular opacities in the upper lobes mostly on the right. This may
all be chronic reflecting scarring. Mild interstitial edema should
be considered. There is no airspace consolidation to suggest
pneumonia. There is no pleural effusion or pneumothorax.

Bony thorax is demineralized but grossly intact.
IMPRESSION: 1. Mild interstitial thickening most prominent in the right upper
lobe. This may all be chronic. Mild interstitial edema should be
considered.
2. No evidence of pneumonia.

## 2016-03-16 IMAGING — DX DG CHEST 2V
2 series · 2 of 2 positions shown · non-contrast
Comparison: 07/18/2014

CLINICAL DATA: Hypoxia, status postcholecystectomy

EXAM:
CHEST  2 VIEW

[chest pa]
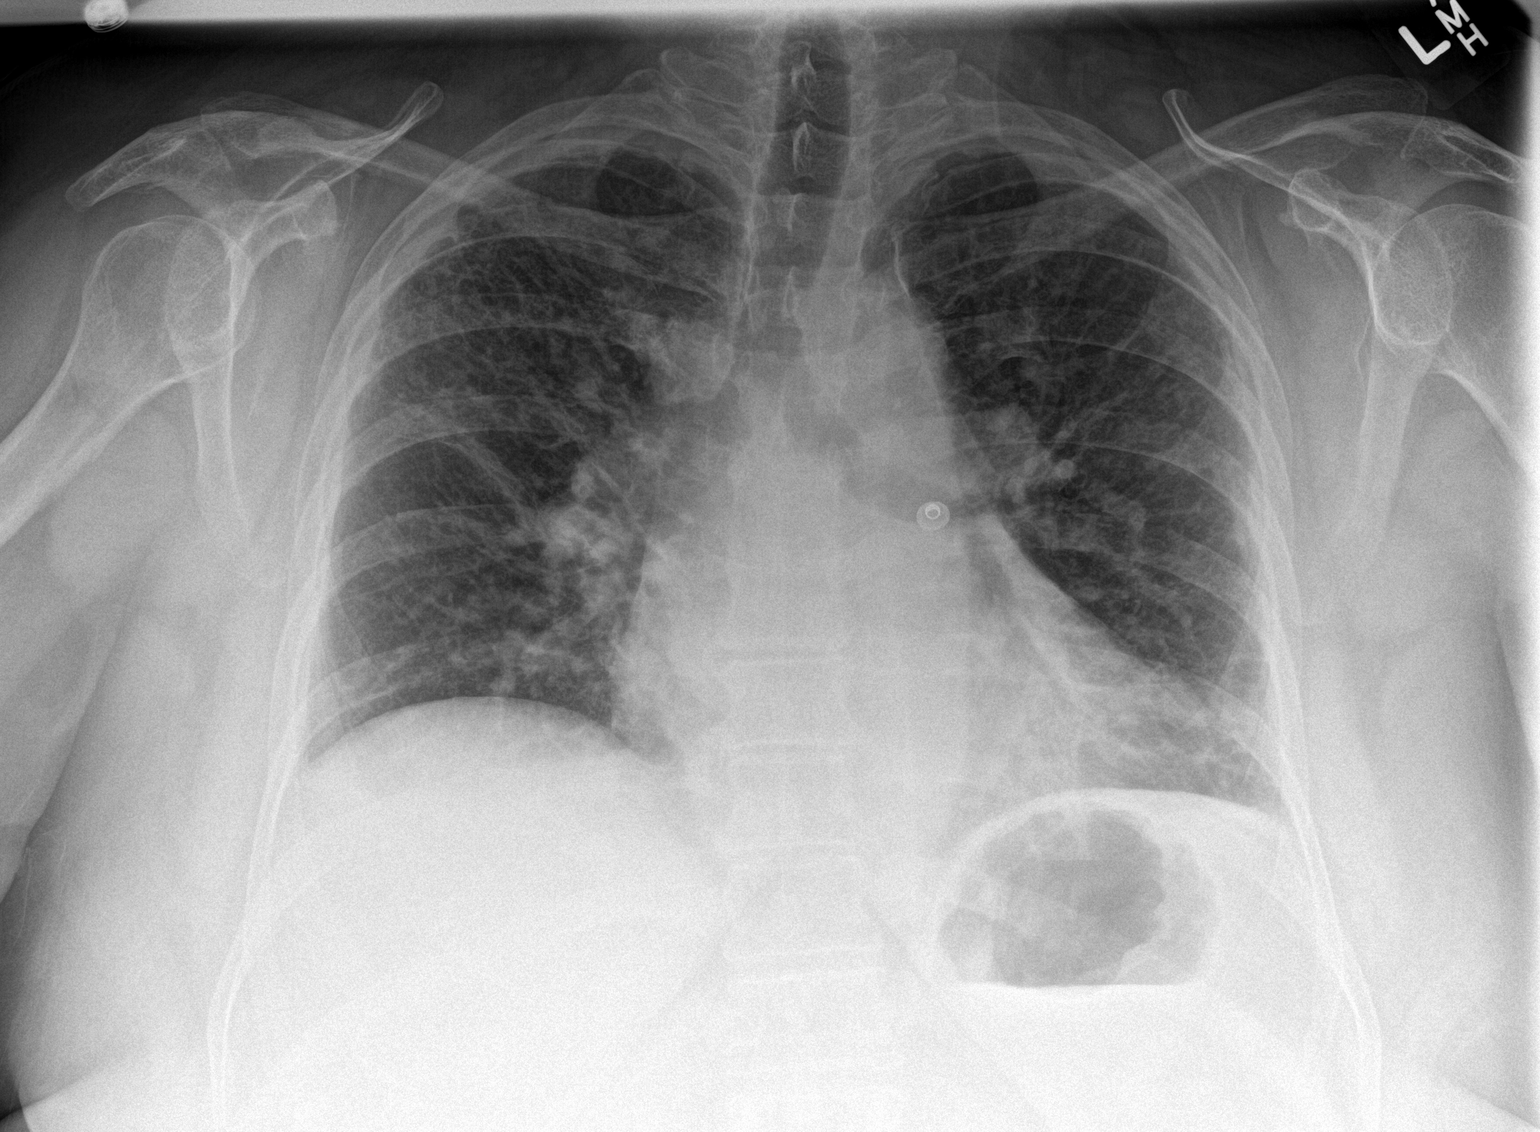

[chest lat]
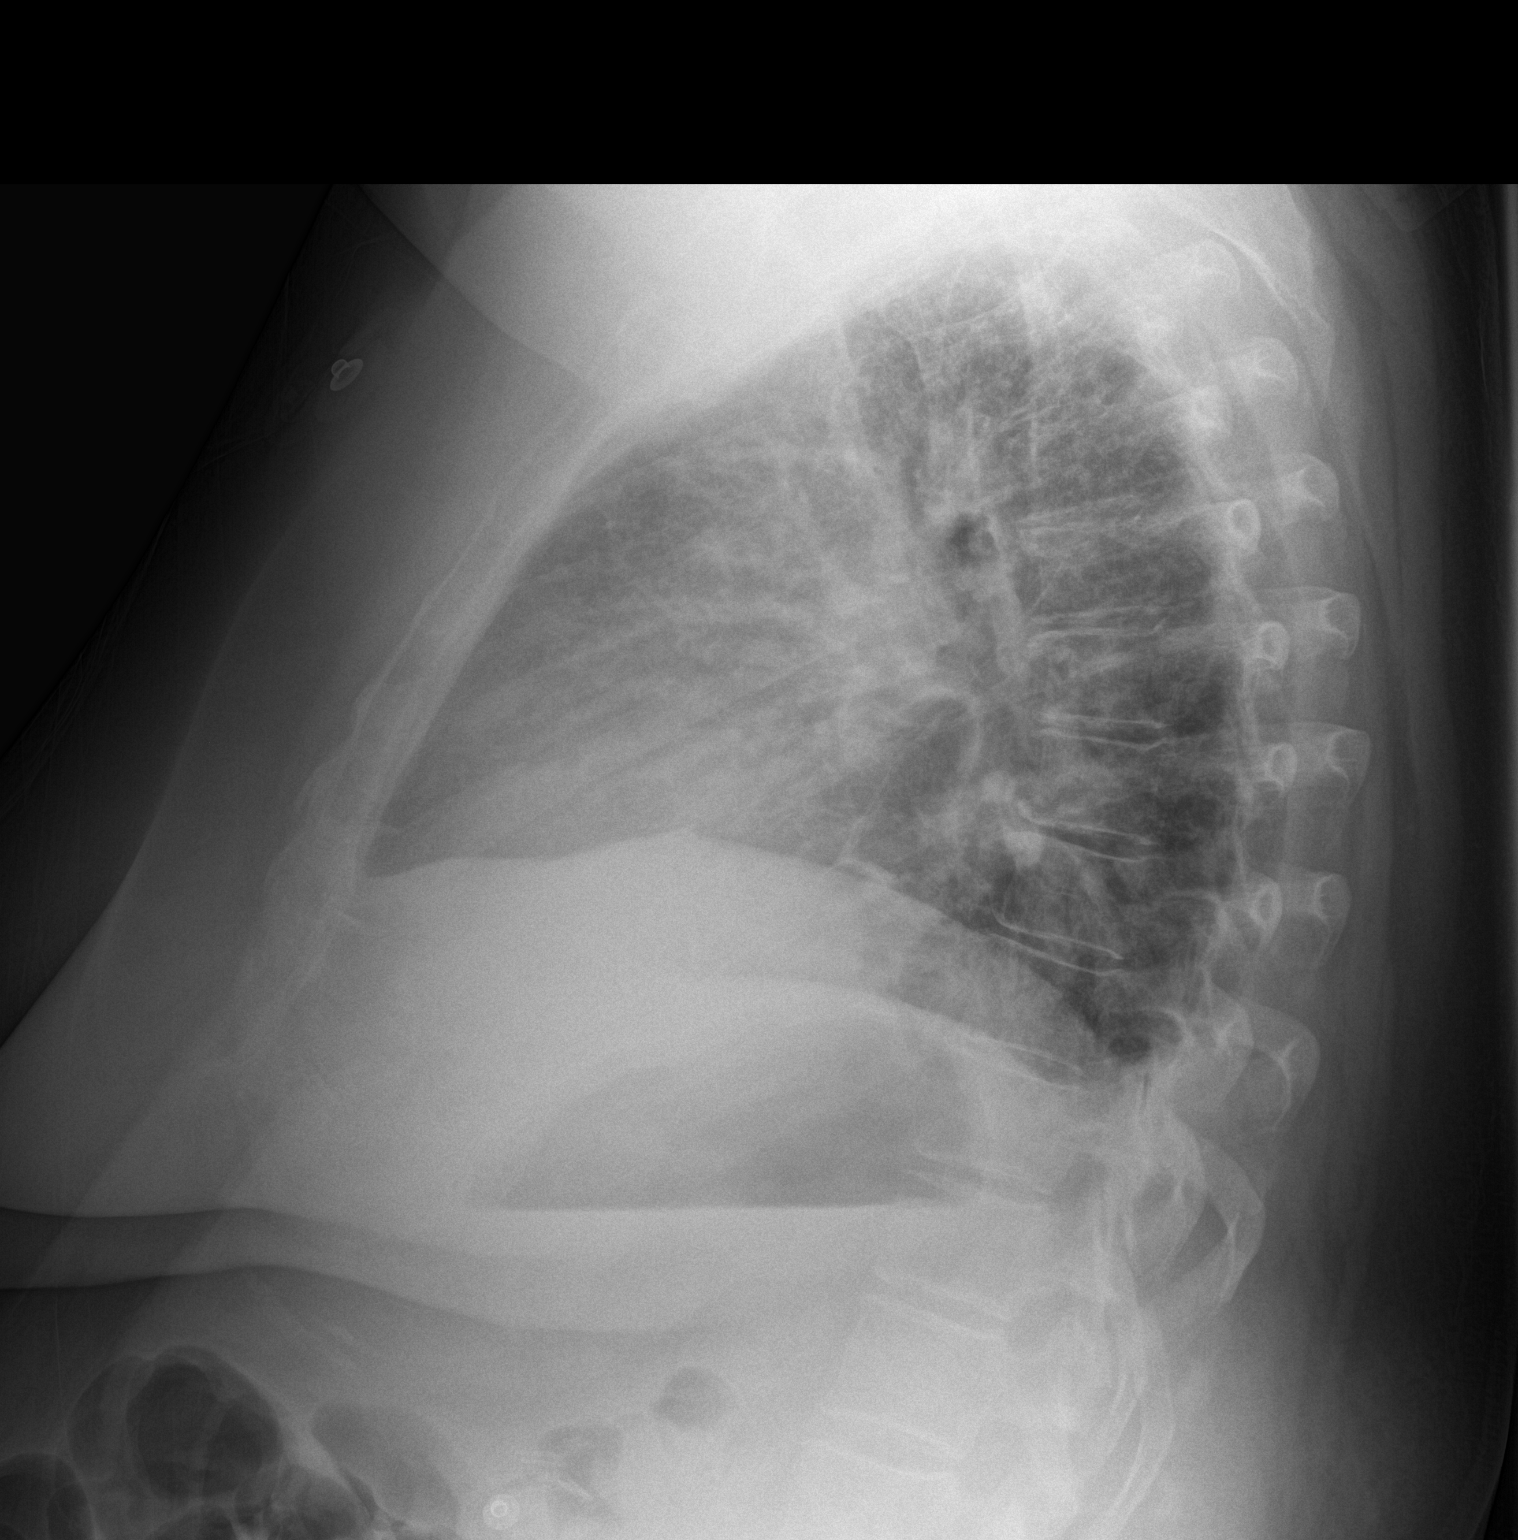

[2 of 2 positions shown; findings below may reference images not displayed]

FINDINGS: Cardiomediastinal silhouette is stable. No segmental infiltrate.
Again noted mild interstitial prominence bilaterally without change
from prior exam. Mild interstitial edema cannot be excluded. No
significant central vascular congestion.
IMPRESSION: No segmental infiltrate. Again noted mild interstitial prominence
bilaterally. Mild interstitial edema cannot be excluded.

## 2016-03-23 ENCOUNTER — Other Ambulatory Visit (INDEPENDENT_AMBULATORY_CARE_PROVIDER_SITE_OTHER): Payer: Medicare Other

## 2016-03-23 ENCOUNTER — Encounter: Payer: Self-pay | Admitting: Internal Medicine

## 2016-03-23 ENCOUNTER — Ambulatory Visit (INDEPENDENT_AMBULATORY_CARE_PROVIDER_SITE_OTHER): Payer: Medicare Other | Admitting: Internal Medicine

## 2016-03-23 VITALS — BP 114/68 | HR 80 | Ht 65.0 in | Wt 278.0 lb

## 2016-03-23 DIAGNOSIS — J449 Chronic obstructive pulmonary disease, unspecified: Secondary | ICD-10-CM | POA: Diagnosis not present

## 2016-03-23 DIAGNOSIS — J9611 Chronic respiratory failure with hypoxia: Secondary | ICD-10-CM

## 2016-03-23 DIAGNOSIS — J849 Interstitial pulmonary disease, unspecified: Secondary | ICD-10-CM | POA: Diagnosis not present

## 2016-03-23 LAB — SEDIMENTATION RATE: Sed Rate: 45 mm/hr — ABNORMAL HIGH (ref 0–30)

## 2016-03-23 MED ORDER — PREDNISONE 10 MG PO TABS: ORAL_TABLET | ORAL | 0 refills | Status: DC

## 2016-03-23 NOTE — Patient Instructions (Addendum)
Prednisone 10 mg  Take 2 daily until better then reduce to one daily x 2 weeks then one half daily   Please remember to go to the lab department downstairs for your tests - we will call you with the results when they are available.     Please schedule a follow up office visit in 4 weeks, sooner if needed

## 2016-03-23 NOTE — Progress Notes (Signed)
Subjective:   Patient ID: Lindsay Brady, female    DOB: 1950/11/06    MRN: [id number]    Brief patient profile:  65 yowf quit smoking 1998 at wt around 200 with some am cough and did fine s rx but in 2011 noted doe across parking lot where worked and dx as copd by [employer] and self referred to pulmonary clinic 12/05/2015 as shifting all her care to [place] with new dx of ILD 02/10/2016 and no evidence of airflow ostruction on symb 160 2bid with poor baseline hfa technique.     History of Present Illness  12/05/2015 1st [facility] office visit/ Lindsay Brady   Chief Complaint  Patient presents with  . Pulmonary Consult    Self referral. Pt states dxed with Emphysema in 2011. She has been seen at [facility] in the past. She c/o hoarseness and increased cough for the past yr.  Cough is prod with white to tan sputum.  She also c/o SOB with walking long distances and up stairs. Hot weather tends to make her breathing worse. She uses ventolin 2 x per day on average.   indolent onset doe x 6 y better on tudorza and spiriva (mouth dry) and no change on symbicort  Needs saba at least twice daily  Already did rehab with wt = ? But did well  Doe = MMRC3 = can't walk 100 yards even at a slow pace at a flat grade s stopping due to sob   Cough p stopped smoking got worse then better and resolved until one year prior to OV  And present daily since but never while on cpap / neg w/u reflux by GI but ent disagreed  rec Plan A = Automatic = symbicort 160 Take 2 puffs first thing in am and then another 2 puffs about 12 hours later / tudorza one twice daily  Plan B = Backup Only use your albuterol as a rescue medication GERD diet  For drainage / throat tickle try take CHLORPHENIRAMINE  4 mg - take one every 4 hours as needed     01/07/16 acute w/in with NP breathing a lot worse  Prednisone taper  Doxycyline x 7 days  Continue symbicort, tudorza  albuterol as needed  Will arrange home O2  Will check some  labs today  Will arrange an echocardiogram and cardiology referral     02/10/2016  Extended f/u ov/Lindsay Brady re: COPD 0/ probable ILD ? Macrodantin related ? When last dose ?  Chief Complaint  Patient presents with  . Follow-up    Cough and SOB had improved on pred and doxy and then worsened a few days after finished meds. Cough is prod and mucus gets hung in her throat. She has not needed to use albuterol since starting o2.   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 Follow-up  Breathing is doing well. She has been using 2lpm with rest and 3 with exertion. Her cough has improved some but she is still c/o hoarseness.      some better while on prednisone only and worse cough/ sob after stopped   No obvious day to day or daytime variability or assoc excess/ purulent sputum or mucus plugs or hemoptysis or cp or chest tightness, subjective wheeze or overt sinus or hb symptoms. No unusual exp hx or h/o childhood pna/ asthma or knowledge of premature birth.  Sleeping ok without nocturnal  or early am exacerbation  of respiratory  c/o's or need for noct saba. Also denies any obvious fluctuation of symptoms with weather or environmental changes or other aggravating or alleviating factors except as outlined above   Current Medications, Allergies, Complete Past Medical History, Past Surgical History, Family History, and Social History were reviewed in [employer] record.  ROS  The following are not active complaints unless bolded sore throat,  dysphagia, dental problems, itching, sneezing,  nasal congestion or excess/ purulent secretions, ear ache,   fever, chills, sweats, unintended wt loss, classically pleuritic or exertional cp,  orthopnea pnd or leg swelling, presyncope, palpitations, abdominal pain, anorexia, nausea, vomiting, diarrhea  or change in bowel or bladder habits, change in stools or urine, dysuria,hematuria,  rash, arthralgias, visual complaints, headache, numbness, weakness or ataxia or problems with walking or coordination,  change in mood/affect or memory.                     Objective:   Physical Exam  Obese amb wf nad      03/23/2016     278   02/10/16 276 lb (125.2 kg)  01/07/16 274 lb (124.3 kg)  12/05/15 279 lb 3.2 oz (126.6 kg)    Vital signs reviewed  - sats 93% on 2lpm on arrival   HEENT: nl dentition, turbinates, and oropharynx. Nl external ear canals without cough reflex   NECK :  without JVD/Nodes/TM/ nl carotid upstrokes bilaterally   LUNGS: no acc muscle use,  Nl contour chest with minimal insp crackles bilaterally in bases    CV:  RRR  no s3 or murmur or increase in P2, no edema   ABD:  soft and nontender with nl inspiratory excursion in the supine position. No bruits or organomegaly, bowel sounds nl  MS:  Nl gait/ ext warm without deformities, calf tenderness, cyanosis or clubbing No obvious joint restrictions   SKIN: warm and dry without lesions    NEURO:  alert, approp, nl sensorium with  no motor deficits    CXR PA and Lateral:   02/10/2016 :    I personally reviewed images and agree with radiology impression as follows:   Stable chronic interstitial lung disease.  No acute findings.         Lab Results  Component Value Date   ESRSEDRATE 45 (H) 03/23/2016   ESRSEDRATE 86 (H) 02/10/2016         Assessment & Plan:

## 2016-03-24 ENCOUNTER — Telehealth: Payer: Self-pay | Admitting: Internal Medicine

## 2016-03-24 NOTE — Assessment & Plan Note (Signed)
SATURATION QUALIFICATIONS:  02/10/2016  Patient Saturations on Room Air at Rest = 90% Patient Saturations on Room Air while Ambulating = 83% Patient Saturations on 3 Liters of oxygen while Ambulating = 90%  rx as of 03/23/2016  = 2lpm at rest/ 3lpm with walking   Hopefully will see response to prednisone and be able to taper off 02 eventually

## 2016-03-24 NOTE — Assessment & Plan Note (Signed)
Last macrodantin exposure =    06-16-15,11-21-15, and 12-17-15.7 day course 100mg BID  ANA Pos 01/07/16 homogeneous > anti dna titer 02/10/2016 >>>   - ESR 02/10/2016 = 86 > pred x 6 days  - ESR 03/23/2016  = 45 rec pred 20 mg until bette then floor of  5mg daily   Clearly better p very short course pred in setting of repeated exp to macrodantin and intermediate esr   The goal with a chronic steroid dependent illness is always arriving at the lowest effective dose that controls the disease/symptoms and not accepting a set "formula" which is based on statistics or guidelines that don't always take into account patient  variability or the natural hx of the dz in every individual patient, which may well vary over time.  For now therefore I recommend the patient maintain  A ceiling of 20 mg and a floor of 5 mg daily   I had an extended discussion with the patient reviewing all relevant studies completed to date and  lasting 15 to 20 minutes of a 25 minute visit    Each maintenance medication was reviewed in detail including most importantly the difference between maintenance and prns and under what circumstances the prns are to be triggered using an action plan format that is not reflected in the computer generated alphabetically organized AVS.    Please see instructions for details which were reviewed in writing and the patient given a copy highlighting the part that I personally wrote and discussed at today's ov.  

## 2016-03-24 NOTE — Telephone Encounter (Signed)
Notes Recorded by Nyoka CowdenMichael B Wert, MD on 03/24/2016 at 6:17 AM EDT Call patient : Study is improved as suspected would be the case so the pred dose recommended is the best one for now given these results   ------------ lmtcb X1 for pt to relay results.

## 2016-03-24 NOTE — Progress Notes (Signed)
LMTCB

## 2016-03-24 NOTE — Assessment & Plan Note (Signed)
PFTs  12/12/14  FEV1  1.85 (81) ratio 88 PFTs  06/19/15  FEV1  1.79(79%) ratio 83  12/05/2015  After extensive coaching HFA effectiveness =    75%  > continue symbicort 160 2bid and tudorza one bid pending return with formulary in hand  - d/c tuodorza 02/10/2016 due to cough   Breathing no worse off tudorza and this is not copd so next issue is whether needs laba/ics at all for ? AB or not

## 2016-03-26 NOTE — Telephone Encounter (Signed)
Called and spoke with pt and she is aware of results per MW>  

## 2016-04-20 ENCOUNTER — Ambulatory Visit: Payer: 59 | Admitting: Internal Medicine

## 2016-05-04 ENCOUNTER — Ambulatory Visit (INDEPENDENT_AMBULATORY_CARE_PROVIDER_SITE_OTHER): Payer: Medicare Other | Admitting: Internal Medicine

## 2016-05-04 ENCOUNTER — Encounter: Payer: Self-pay | Admitting: Internal Medicine

## 2016-05-04 ENCOUNTER — Other Ambulatory Visit (INDEPENDENT_AMBULATORY_CARE_PROVIDER_SITE_OTHER): Payer: Medicare Other

## 2016-05-04 VITALS — BP 126/72 | HR 65 | Ht 65.0 in | Wt 280.6 lb

## 2016-05-04 DIAGNOSIS — J9611 Chronic respiratory failure with hypoxia: Secondary | ICD-10-CM | POA: Diagnosis not present

## 2016-05-04 DIAGNOSIS — J449 Chronic obstructive pulmonary disease, unspecified: Secondary | ICD-10-CM

## 2016-05-04 DIAGNOSIS — J849 Interstitial pulmonary disease, unspecified: Secondary | ICD-10-CM

## 2016-05-04 LAB — SEDIMENTATION RATE: SED RATE: 65 mm/h — AB (ref 0–30)

## 2016-05-04 NOTE — Progress Notes (Signed)
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     History of Present Illness  12/05/2015 1st [facility] office visit/ Tramel Westbrook   Chief Complaint  Patient presents with  . Pulmonary Consult    Self referral. Pt states dxed with Emphysema in 2011. She has been seen at [facility] in the past. She c/o hoarseness and increased cough for the past yr.  Cough is prod with white to tan sputum.  She also c/o SOB with walking long distances and up stairs. Hot weather tends to make her breathing worse. She uses ventolin 2 x per day on average.   indolent onset doe x 6 y better on tudorza and spiriva (mouth dry) and no change on symbicort  Needs saba at least twice daily  Already did rehab with wt = ? But did well  Doe = MMRC3 = can't walk 100 yards even at a slow pace at a flat grade s stopping due to sob   Cough p stopped smoking got worse then better and resolved until one year prior to OV  And present daily since but never while on cpap / neg w/u reflux by GI but ent disagreed  rec Plan A = Automatic = symbicort 160 Take 2 puffs first thing in am and then another 2 puffs about 12 hours later / tudorza one twice daily  Plan B = Backup Only use your albuterol as a rescue medication GERD diet  For drainage / throat tickle try take CHLORPHENIRAMINE  4 mg - take one every 4 hours as needed     01/07/16 acute w/in with NP breathing a lot worse  Prednisone taper  Doxycyline x 7 days  Continue symbicort, tudorza  albuterol as needed  Will arrange home O2  Will check some  labs today  Will arrange an echocardiogram and cardiology referral     02/10/2016  Extended f/u ov/Kayle Passarelli re: COPD 0/ probable ILD ? Macrodantin related ? When last dose ?  Chief Complaint  Patient presents with  . Follow-up    Cough and SOB had improved on pred and doxy and then worsened a few days after finished meds. Cough is prod and mucus gets hung in her throat. She has not needed to use albuterol since starting o2.   doe x MMRC2 = can't walk a nl pace on a flat grade s sob but does fine slow and flat eg  shopping on 02/ main issue is hacking cough and sensation of globus day >> noct rec Call us with the dates you took macrodantin and do not take this again > last rx  12/17/15 x 7 d only   Prednisone 10 mg take  4 each am x 2 days,   2 each am x 2 days,  1 each am x 2 days and stop    03/23/2016  f/u ov/Emilyanne Mcgough re:   ILD / macrodantin exp with high ESR  Chief Complaint  Patient presents with  . Follow-up  Breathing is doing well. She has been using 2lpm with rest and 3 with exertion. Her cough has improved some but she is still c/o hoarseness.      some better while on prednisone only and worse cough/ sob after stopped  rec Prednisone 10 mg  Take 2 daily until better then reduce to one daily x 2 weeks then one half daily     05/04/2016  f/u ov/Kylieann Eagles re: ILD ? Macrodantin on 5 mg daily  Chief Complaint  Patient presents with  . Follow-up    4 week follow up. Breathing has been ok. Was bothered by allergies around 3 weeks ago with a cough and fatigue.   mmrc = 2 = MMRC2 = can't walk a nl pace on a flat grade s sob but does fine slow and flat eg does fine walking on 3lpm leaning a cart / coiugh has resolved   No obvious day to day or daytime variability or assoc excess/ purulent sputum or mucus plugs or hemoptysis or cp or chest tightness, subjective wheeze or overt sinus or hb symptoms. No unusual exp hx or h/o childhood pna/ asthma or knowledge of premature birth.  Sleeping ok  without nocturnal  or early am exacerbation  of respiratory  c/o's or need for noct saba. Also denies any obvious fluctuation of symptoms with weather or environmental changes or other aggravating or alleviating factors except as outlined above   Current Medications, Allergies, Complete Past Medical History, Past Surgical History, Family History, and Social History were reviewed in [employer] record.  ROS  The following are not active complaints unless bolded sore throat, dysphagia, dental problems, itching, sneezing,  nasal congestion or excess/ purulent secretions, ear ache,   fever, chills, sweats, unintended wt loss, classically pleuritic or exertional cp,  orthopnea pnd or leg swelling, presyncope, palpitations, abdominal pain, anorexia, nausea, vomiting, diarrhea  or change in bowel or bladder habits, change in stools or urine, dysuria,hematuria,  rash, arthralgias, visual complaints, headache, numbness, weakness or ataxia or problems with walking or coordination,  change in mood/affect or memory.                     Objective:   Physical Exam  Obese amb wf nad    05/04/2016         03/23/2016     278   02/10/16 276 lb (125.2 kg)  01/07/16 274 lb (124.3 kg)  12/05/15 279 lb 3.2 oz (126.6 kg)     Vital signs reviewed  - sats 97% on 3lpm on arrival   HEENT: nl dentition, turbinates, and oropharynx. Nl external ear canals without cough reflex   NECK :  without JVD/Nodes/TM/ nl carotid upstrokes bilaterally   LUNGS: no acc muscle use,  Nl contour chest with minimal insp crackles bilaterally in bases    CV:  RRR  no s3 or murmur or increase in P2, no edema   ABD:  soft and nontender with nl inspiratory excursion in the supine position. No bruits or organomegaly, bowel sounds nl  MS:  Nl gait/ ext warm without deformities, calf tenderness, cyanosis or clubbing No obvious joint restrictions   SKIN: warm and dry without lesions    NEURO:  alert,  approp, nl sensorium with  no motor deficits    CXR PA and Lateral:   02/10/2016 :    I personally reviewed images and agree with radiology impression as follows:   Stable chronic interstitial lung disease.  No acute findings.      Lab Results  Component Value Date   ESRSEDRATE 65 (H) 05/04/2016   ESRSEDRATE 45 (H) 03/23/2016   ESRSEDRATE 86 (H) 02/10/2016            Assessment & Plan:

## 2016-05-04 NOTE — Patient Instructions (Addendum)
Please remember to go to the lab  department downstairs for your tests - we will call you with the results when they are available.  Schedule High Resolution CT chest [facility]  Highest prednisone dose is 20 mg and the lowest  Is 5 mg   Avoid food that high in fat or carbs  Avoid fried foods/ starchy foods and sweets.   Please schedule a follow up office visit in 6 weeks, call sooner if needed  - consider wean off symbicort as this is not copd

## 2016-05-05 ENCOUNTER — Encounter: Payer: Self-pay | Admitting: Internal Medicine

## 2016-05-05 ENCOUNTER — Other Ambulatory Visit: Payer: Self-pay | Admitting: Internal Medicine

## 2016-05-05 DIAGNOSIS — J849 Interstitial pulmonary disease, unspecified: Secondary | ICD-10-CM

## 2016-05-05 NOTE — Assessment & Plan Note (Signed)
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 based on failure to improve off macrodantin and persistent marked elevation of esr albeit on relatively low doses of pred  Discussed in detail all the  indications, usual  risks and alternatives  relative to the benefits with patient who agrees to proceed with conservative f/u with HRCT/ pft's ordered today  .The goal with a chronic steroid dependent illness is always arriving at the lowest effective dose that controls the disease/symptoms and not accepting a set "formula" which is based on statistics or guidelines that don't always take into account patient  variability or the natural hx of the dz in every individual patient, which may well vary over time.  For now therefore I recommend the patient maintain  Ceiling of 20/ floor of 5 mg daily   I had an extended discussion with the patient reviewing all relevant studies completed to date and  lasting 15 to 20 minutes of a 25 minute visit    Each maintenance medication was reviewed in detail including most importantly the difference between maintenance and prns and under what circumstances the prns are to be triggered using an action plan format that is not reflected in the computer generated alphabetically organized AVS.    Please see AVS for unique instructions that I personally wrote and verbalized to the the pt in detail and then reviewed with pt  by my nurse highlighting any  changes in therapy recommended at today's visit to their plan of care.

## 2016-05-05 NOTE — Progress Notes (Signed)
Spoke with pt and notified of results per Dr. Wert. Pt verbalized understanding and denied any questions. 

## 2016-05-05 NOTE — Assessment & Plan Note (Addendum)
Body mass index is 46.69 tending up on prednisone - see AVS for dietary concerns on steroids No results found for: TSH   Contributing to gerd tendency/ doe/reviewed the need and the process to achieve and maintain neg calorie balance > defer f/u primary care including intermittently monitoring thyroid status

## 2016-05-05 NOTE — Assessment & Plan Note (Signed)
PFTs  12/12/14  FEV1  1.85 (81) ratio 88 PFTs  06/19/15  FEV1  1.79(79%) ratio 83  12/05/2015  After extensive coaching HFA effectiveness =    75%  > continue symbicort 160 2bid and tudorza one bid pending return with formulary in hand  - d/c tuodorza 02/10/2016 due to cough    Very little evidence of copd at all/ low threshold to wean off symbicort next ov

## 2016-05-05 NOTE — Assessment & Plan Note (Signed)
SATURATION QUALIFICATIONS:  02/10/2016  Patient Saturations on Room Air at Rest = 90% Patient Saturations on Room Air while Ambulating = 83% Patient Saturations on 3 Liters of oxygen while Ambulating = 90%  rx as of 05/04/2016  = 2lpm at rest/ 3lpm with walking   Adequate control on present rx, reviewed in detail with pt > no change in rx needed

## 2016-05-18 ENCOUNTER — Ambulatory Visit (HOSPITAL_COMMUNITY)
Admission: RE | Admit: 2016-05-18 | Discharge: 2016-05-18 | Disposition: A | Discharge status: Home / Self Care | Payer: Medicare Other | Source: Ambulatory Visit | Attending: Internal Medicine | Admitting: Internal Medicine

## 2016-05-18 DIAGNOSIS — J479 Bronchiectasis, uncomplicated: Secondary | ICD-10-CM | POA: Insufficient documentation

## 2016-05-18 DIAGNOSIS — J439 Emphysema, unspecified: Secondary | ICD-10-CM | POA: Insufficient documentation

## 2016-05-18 DIAGNOSIS — R590 Localized enlarged lymph nodes: Secondary | ICD-10-CM | POA: Insufficient documentation

## 2016-05-18 DIAGNOSIS — Z029 Encounter for administrative examinations, unspecified: Secondary | ICD-10-CM | POA: Insufficient documentation

## 2016-05-18 DIAGNOSIS — I2510 Atherosclerotic heart disease of native coronary artery without angina pectoris: Secondary | ICD-10-CM | POA: Insufficient documentation

## 2016-05-18 DIAGNOSIS — I700 Atherosclerosis of aorta: Secondary | ICD-10-CM | POA: Insufficient documentation

## 2016-05-18 DIAGNOSIS — J849 Interstitial pulmonary disease, unspecified: Secondary | ICD-10-CM | POA: Insufficient documentation

## 2016-05-18 LAB — PULMONARY FUNCTION TEST
DL/VA % pred: 98 %
DL/VA: 4.87 ml/min/mmHg/L
DLCO UNC % PRED: 52 %
DLCO unc: 13.53 ml/min/mmHg
FEF 25-75 PRE: 3.52 L/s
FEF 25-75 Post: 3.54 L/sec
FEF2575-%Change-Post: 0 %
FEF2575-%PRED-POST: 162 %
FEF2575-%PRED-PRE: 161 %
FEV1-%Change-Post: 1 %
FEV1-%PRED-POST: 75 %
FEV1-%Pred-Pre: 74 %
FEV1-Post: 1.9 L
FEV1-Pre: 1.87 L
FEV1FVC-%CHANGE-POST: 1 %
FEV1FVC-%PRED-PRE: 117 %
FEV6-%CHANGE-POST: 0 %
FEV6-%PRED-POST: 65 %
FEV6-%Pred-Pre: 65 %
FEV6-Post: 2.08 L
FEV6-Pre: 2.06 L
FEV6FVC-%Change-Post: 0 %
FEV6FVC-%Pred-Post: 104 %
FEV6FVC-%Pred-Pre: 103 %
FVC-%Change-Post: 0 %
FVC-%PRED-POST: 63 %
FVC-%Pred-Pre: 63 %
FVC-Post: 2.09 L
FVC-Pre: 2.07 L
POST FEV1/FVC RATIO: 91 %
PRE FEV1/FVC RATIO: 90 %
Post FEV6/FVC ratio: 100 %
Pre FEV6/FVC Ratio: 100 %
RV % PRED: 38 %
RV: 0.83 L
TLC % pred: 57 %
TLC: 2.98 L

## 2016-05-18 MED ORDER — ALBUTEROL SULFATE (2.5 MG/3ML) 0.083% IN NEBU
2.5000 mg | INHALATION_SOLUTION | Freq: Once | RESPIRATORY_TRACT | Status: AC
  Administered 2016-05-18: 2.5 mg via RESPIRATORY_TRACT

## 2016-05-19 ENCOUNTER — Telehealth: Payer: Self-pay | Admitting: Internal Medicine

## 2016-05-19 NOTE — Progress Notes (Signed)
lmtcb

## 2016-05-19 NOTE — Telephone Encounter (Signed)
Notes Recorded by Nyoka CowdenMichael B Wert, MD on 05/19/2016 at 5:41 AM EST Call patient : Study shows no surprises > no need to change rx/ keep appt for f/u to review details/ same for CT chest   Pt aware of results and voiced her understanding. Nothing further needed.

## 2016-05-30 ENCOUNTER — Other Ambulatory Visit: Payer: Self-pay | Admitting: Internal Medicine

## 2016-05-30 DIAGNOSIS — J849 Interstitial pulmonary disease, unspecified: Secondary | ICD-10-CM

## 2016-06-01 ENCOUNTER — Telehealth: Payer: Self-pay | Admitting: Internal Medicine

## 2016-06-01 MED ORDER — PREDNISONE 10 MG PO TABS: 10.0000 mg | ORAL_TABLET | Freq: Every day | ORAL | 0 refills | Status: DC

## 2016-06-01 NOTE — Telephone Encounter (Signed)
Spoke with the pt and notified of recs per MW  She verbalized understanding  Rx sent to pharm

## 2016-06-01 NOTE — Telephone Encounter (Signed)
Doxycyline 100 mg bid x 7 days and ov after that if not better, if worse in meantime either work in here or go to [facility]

## 2016-06-01 NOTE — Telephone Encounter (Signed)
MW  Please Advise-Sick Message  Pt. States she was around a sick child, Pt. C/o of coughing a lot, lots of congestion in her chest, she is wheezing some, increase sob, denies fever, chest tightness. She states she has increased her prednisone to 69m for the past 5 days. She wanted to know what she should do. Looks like a refill was made on her prednisone today.  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 based on failure to improve off macrodantin and persistent marked elevation of esr albeit on relatively low doses of pred  Discussed in detail all the  indications, usual  risks and alternatives  relative to the benefits with patient who agrees to proceed with conservative f/u with HRCT/ pft's ordered today  .The goal with a chronic steroid dependent illness is always arriving at the lowest effective dose that controls the disease/symptoms and not accepting a set "formula" which is based on statistics or guidelines that don't always take into account patient  variability or the natural hx of the dz in every individual patient, which may well vary over time.  For now therefore I recommend the patient maintain  Ceiling of 20/ floor of 5 mg daily   I had an extended discussion with the patient reviewing all relevant studies completed to date and  lasting 15 to 20 minutes of a 25 minute visit    Each maintenance medication was reviewed in detail including most importantly the difference between maintenance and prns and under what circumstances the prns are to be triggered using an action plan format that is not reflected in the computer generated alphabetically organized AVS.    Please see AVS for unique instructions that I personally wrote and verbalized to the the pt in detail and then reviewed with pt  by my nurse highlighting any  changes in therapy recommended at today's visit to their plan of care.   predniSONE (DELTASONE) 10 MG tablet  [[[id number]] Order Details  Dose, Route, Frequency: As Directed   Dispense Quantity:  100 tablet Refills:  0 Fills remaining:  --        Sig: TAKE 2 TABS BY MOUTH DAILY UNTIL BETTER, 1 TAB DAILY X 2 WEEKS THEN 1/2 TAB DAILY THEREAFTER       Written Date:  06/01/16 Expiration Date:  06/01/17    Start Date:  06/01/16 End Date:  --         Ordering Provider:  MTanda Rockers MD DEA #:  [id number]:  [id number]  Authorizing Provider:  MTanda Rockers MD DEA #:  [id number]:  [id number]  Ordering User:  CValerie Salts [place]

## 2016-06-02 ENCOUNTER — Telehealth: Payer: Self-pay | Admitting: Internal Medicine

## 2016-06-02 MED ORDER — DOXYCYCLINE HYCLATE 100 MG PO TABS: 100.0000 mg | ORAL_TABLET | Freq: Two times a day (BID) | ORAL | 0 refills | Status: DC

## 2016-06-02 NOTE — Telephone Encounter (Signed)
Spoke with pt. She was to have a prescription for Doxy sent to her pharmacy yesterday. This didn't get done. Rx has been sent in. Nothing further was needed.

## 2016-06-04 ENCOUNTER — Telehealth: Payer: Self-pay | Admitting: Internal Medicine

## 2016-06-04 NOTE — Telephone Encounter (Signed)
Called and spoke to pt. Pt states her cough has worsened when walking outside in the cold. Pt states she has stress incontinence and is requesting something for the cough. Pt states overall she feels better since being on Doxy and pred 20mg  but the cough is still bothering her. Pt states the cough is worse when outside in the cold and at night.   Dr. Sherene SiresWert please advise. Thanks.   ----------------------------------------------------  Please remember to go to the lab  department downstairs for your tests - we will call you with the results when they are available.   Schedule High Resolution CT chest and PFT on same day in next two weeks at [facility]  Highest prednisone dose is 20 mg and the lowest  Is 5 mg    Avoid food that high in fat or carbs  Avoid fried foods/ starchy foods and sweets.    Please schedule a follow up office visit in 6 weeks, call sooner if needed  - consider wean off symbicort as this is not copd

## 2016-06-04 NOTE — Telephone Encounter (Signed)
Spoke with pt. And informed her of MW recc. Pt. Agreed to try the treatment options. She had no further questions. Nothing further is needed at this time.

## 2016-06-04 NOTE — Telephone Encounter (Signed)
Best we can do is mucinex dm or delsym otc everything else is narcotic and requires ov

## 2016-06-15 ENCOUNTER — Encounter: Payer: Self-pay | Admitting: Internal Medicine

## 2016-06-15 ENCOUNTER — Ambulatory Visit (INDEPENDENT_AMBULATORY_CARE_PROVIDER_SITE_OTHER): Payer: Medicare Other | Admitting: Internal Medicine

## 2016-06-15 ENCOUNTER — Other Ambulatory Visit (INDEPENDENT_AMBULATORY_CARE_PROVIDER_SITE_OTHER): Payer: Medicare Other

## 2016-06-15 VITALS — BP 130/78 | HR 77 | Ht 65.0 in | Wt 290.0 lb

## 2016-06-15 DIAGNOSIS — J9611 Chronic respiratory failure with hypoxia: Secondary | ICD-10-CM | POA: Diagnosis not present

## 2016-06-15 DIAGNOSIS — J849 Interstitial pulmonary disease, unspecified: Secondary | ICD-10-CM

## 2016-06-15 DIAGNOSIS — J449 Chronic obstructive pulmonary disease, unspecified: Secondary | ICD-10-CM

## 2016-06-15 LAB — SEDIMENTATION RATE: SED RATE: 26 mm/h (ref 0–30)

## 2016-06-15 NOTE — Progress Notes (Signed)
Spoke with pt and notified of results per Dr. Wert. Pt verbalized understanding and denied any questions. 

## 2016-06-15 NOTE — Progress Notes (Signed)
Subjective:   Patient ID: Lindsay Brady, female    DOB: Feb 19, 1951    MRN: [id number]    Brief patient profile:  65 yowf quit smoking 1998 at wt around 200 with some am cough and did fine s rx but in 2011 noted doe across parking lot where worked and dx as copd by [employer] and self referred to pulmonary clinic 12/05/2015 as shifting all her care to [place] with new dx of ILD 02/10/2016 and no evidence of airflow ostruction on symb 160 2bid with poor baseline hfa technique.     History of Present Illness  12/05/2015 1st [place] [facility] office visit/ Lindsay Brady   Chief Complaint  Patient presents with  . Pulmonary Consult    Self referral. Pt states dxed with Emphysema in 2011. She has been seen at [facility] in the past. She c/o hoarseness and increased cough for the past yr.  Cough is prod with white to tan sputum.  She also c/o SOB with walking long distances and up stairs. Hot weather tends to make her breathing worse. She uses ventolin 2 x per day on average.   indolent onset doe x 6 y better on tudorza and spiriva (mouth dry) and no change on symbicort  Needs saba at least twice daily  Already did rehab with wt = ? But did well  Doe = MMRC3 = can't walk 100 yards even at a slow pace at a flat grade s stopping due to sob   Cough p stopped smoking got worse then better and resolved until one year prior to OV  And present daily since but never while on cpap / neg w/u reflux by GI but ent disagreed  rec Plan A = Automatic = symbicort 160 Take 2 puffs first thing in am and then another 2 puffs about 12 hours later / tudorza one twice daily  Plan B = Backup Only use your albuterol as a rescue medication GERD diet  For drainage / throat tickle try take CHLORPHENIRAMINE  4 mg - take one every 4 hours as needed     01/07/16 acute w/in with NP breathing a lot worse  Prednisone taper  Doxycyline x 7 days  Continue symbicort, tudorza  albuterol as needed  Will arrange home O2  Will check some  labs today  Will arrange an echocardiogram and cardiology referral     02/10/2016  Extended f/u ov/Lindsay Brady re: COPD 0/ probable ILD ? Macrodantin related ? When last dose ?  Chief Complaint  Patient presents with  . Follow-up    Cough and SOB had improved on pred and doxy and then worsened a few days after finished meds. Cough is prod and mucus gets hung in her throat. She has not needed to use albuterol since starting o2.   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 Follow-up  Breathing is doing well. She has been using 2lpm with rest and 3 with exertion. Her cough has improved some but she is still c/o hoarseness.      some better while on prednisone only and worse cough/ sob after stopped  rec Prednisone 10 mg  Take 2 daily until better then reduce to one daily x 2 weeks then one half daily     05/04/2016  f/u ov/Lindsay Brady re: ILD ? Macrodantin on 5 mg daily  Chief Complaint  Patient presents with  . Follow-up    4 week follow up. Breathing has been ok. Was bothered by allergies around 3 weeks ago with a cough and fatigue.   mmrc = 2 = MMRC2 = can't walk a nl pace on a flat grade s sob but does fine slow and flat eg does fine walking on 3lpm leaning a cart / cough has resolved  rec Please remember to go to the lab  department downstairs for your tests - we will call you with the results when they are available. Schedule High Resolution CT chest and PFT on same day in next two weeks at [facility]  Highest prednisone dose is 20 mg and the lowest   Is 5 mg  Avoid food that high in fat or carbs  Avoid fried foods/ starchy foods and sweets.  Please schedule a follow up office visit in 6 weeks, call sooner if needed  - consider wean off symbicort as this is not copd   06/01/16 Uri/ doxy 10 > back to baseline   06/15/2016  f/u ov/Lindsay Brady re: PF on pred 10 mg daily  X 6 days p taper from 20 on 02 2lpm  / copd 0 on symb 160 2bid  Chief Complaint  Patient presents with  . Follow-up    Pt is following up for ILD.   doe = 3lpm leaning on cart can do HT only = MMRC3 = can't walk 100 yards even at a slow pace at a flat grade s stopping due to sob  / never uses saba prior to ex / can't tell symbicort helps when misses doses Doing recumbant bike x 10 min no resistance not checking sats/ very little insight     No obvious day to day or daytime variability or assoc excess/ purulent sputum or mucus plugs or hemoptysis or cp or chest tightness, subjective wheeze or overt sinus or hb symptoms. No unusual exp hx or h/o childhood pna/ asthma or knowledge of premature birth.  Sleeping ok on cpap  without nocturnal  or early am exacerbation  of respiratory  c/o's or need for noct saba. Also denies any obvious fluctuation of symptoms with weather or environmental changes or other aggravating or alleviating factors except as outlined above   Current Medications, Allergies, Complete Past Medical History, Past Surgical History, Family History, and Social History were reviewed in [employer] record.  ROS  The following are not active complaints unless bolded sore throat, dysphagia, dental problems, itching, sneezing,  nasal congestion or excess/ purulent secretions, ear ache,   fever, chills, sweats, unintended wt loss, classically pleuritic or exertional cp,  orthopnea pnd or leg swelling, presyncope, palpitations, abdominal pain, anorexia, nausea, vomiting, diarrhea  or change in bowel or bladder habits, change in stools or urine,  dysuria,hematuria,  rash, arthralgias L knee , visual complaints, headache, numbness, weakness or ataxia or problems with walking or coordination,  change in mood/affect or memory.  Objective:   Physical Exam  Obese amb wf nad    05/04/2016        290 03/23/2016     278   02/10/16 276 lb (125.2 kg)  01/07/16 274 lb (124.3 kg)  12/05/15 279 lb 3.2 oz (126.6 kg)     Vital signs reviewed  - sats 95% on2lpm on arrival   HEENT: nl dentition, turbinates, and oropharynx. Nl external ear canals without cough reflex   NECK :  without JVD/Nodes/TM/ nl carotid upstrokes bilaterally   LUNGS: no acc muscle use,  Nl contour chest with minimal insp crackles bilaterally in bases/ distant bs    CV:  RRR  no s3 or murmur or increase in P2, no edema   ABD:  soft and nontender with nl inspiratory excursion in the supine position. No bruits or organomegaly, bowel sounds nl  MS:  Nl gait/ ext warm without deformities, calf tenderness, cyanosis or clubbing No obvious joint restrictions   SKIN: warm and dry without lesions    NEURO:  alert, approp, nl sensorium with  no motor deficits    CXR PA and Lateral:   02/10/2016 :    I personally reviewed images and agree with radiology impression as follows:   Stable chronic interstitial lung disease.  No acute findings.        Lab Results  Component Value Date   ESRSEDRATE 26 06/15/2016   ESRSEDRATE 65 (H) 05/04/2016   ESRSEDRATE 45 (H) 03/23/2016         I personally reviewed images and agree with radiology impression as follows:  hrct Chest  05/18/16 1. Moderate fibrotic interstitial lung disease characterized by patchy subpleural and peribronchovascular reticulation and ground-glass attenuation, mild traction bronchiectasis, mild architectural distortion and apparent upper lobe honeycombing. Findings appear to have progressed compared to the oldest available chest radiograph of 07/18/2014. The absence of a  basilar gradient and the presence of mild air trapping suggest a diagnosis of chronic hypersensitivity pneumonitis, although usual interstitial pneumonia (UIP) cannot be excluded     Assessment & Plan:

## 2016-06-15 NOTE — Patient Instructions (Addendum)
Try to build up duration on bike at least 20 minutes before adding resistance  Stop symbicort   Prednisone 10 mg  Take 2 daily until better then reduce to one daily x 2 weeks then one half daily    Please remember to go to the  Lab department downstairs for your tests - we will call you with the results when they are available.  GERD (REFLUX)  is an extremely common cause of respiratory symptoms just like yours , many times with no obvious heartburn at all.    It can be treated with medication, but also with lifestyle changes including elevation of the head of your bed (ideally with 6 inch  bed blocks),  Smoking cessation, avoidance of late meals, excessive alcohol, and avoid fatty foods, chocolate, peppermint, colas, red wine, and acidic juices such as orange juice.  NO MINT OR MENTHOL PRODUCTS SO NO COUGH DROPS   USE SUGARLESS CANDY INSTEAD (Jolley ranchers or Stover's or Life Savers) or even ice chips will also do - the key is to swallow to prevent all throat clearing. NO OIL BASED VITAMINS - use powdered substitutes.  Please schedule a follow up office visit in 6 weeks, call sooner if needed

## 2016-06-16 NOTE — Assessment & Plan Note (Signed)
SATURATION QUALIFICATIONS:  02/10/2016  Patient Saturations on Room Air at Rest = 90% Patient Saturations on Room Air while Ambulating = 83% Patient Saturations on 3 Liters of oxygen while Ambulating = 90%  rx as of 06/15/2016  = 2lpm at rest/ 3lpm with walking / ex (goal is to keep > 90%

## 2016-06-16 NOTE — Assessment & Plan Note (Addendum)
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  For now therefore I recommend the patient maintain  A ceiling of 20 and a floor of 5 mg for now  I had an extended discussion with the patient reviewing all relevant studies completed to date and  lasting 15 to 20 minutes of a 25 minute visit    Each maintenance medication was reviewed in detail including most importantly the difference between maintenance and prns and under what circumstances the prns are to be triggered using an action plan format that is not reflected in the computer generated alphabetically organized AVS.    Please see AVS for specific instructions unique to this visit that I personally wrote and verbalized to the the pt in detail and then reviewed with pt  by my nurse highlighting any  changes in therapy recommended at today's visit to their plan of care.

## 2016-06-16 NOTE — Assessment & Plan Note (Addendum)
PFTs  12/12/14  FEV1  1.85 (81) ratio 88 PFTs  06/19/15  FEV1  1.79(79%) ratio 83  12/05/2015  After extensive coaching HFA effectiveness =    75%  > continue symbicort 160 2bid and tudorza one bid pending return with formulary in hand  - d/c tuodorza 02/10/2016 due to cough   Not clear symbicort really helping > try off and see what difference it makes with symptoms or need for saba

## 2016-06-16 NOTE — Assessment & Plan Note (Signed)
Body mass index is 48.26 still trending up  No results found for: TSH   Contributing to gerd tendency/ doe/reviewed the need and the process to achieve and maintain neg calorie balance > defer f/u primary care including intermittently monitoring thyroid status

## 2016-06-18 ENCOUNTER — Telehealth: Payer: Self-pay | Admitting: *Deleted

## 2016-06-18 NOTE — Telephone Encounter (Signed)
Spoke with the pt  She states she is using CPAP and this is managed by Dr Chewing in [employer]  Will forward to MW to be made aware

## 2016-06-18 NOTE — Telephone Encounter (Signed)
-----   Message from Nyoka CowdenMichael B Wert, MD sent at 06/16/2016  7:23 AM EST ----- Find out if using cpap and if so what doctor is in charge of it

## 2016-06-18 NOTE — Telephone Encounter (Signed)
Aware/ recorded in overview box

## 2016-06-27 ENCOUNTER — Other Ambulatory Visit: Payer: Self-pay | Admitting: Internal Medicine

## 2016-06-27 DIAGNOSIS — J849 Interstitial pulmonary disease, unspecified: Secondary | ICD-10-CM

## 2016-06-29 ENCOUNTER — Telehealth: Payer: Self-pay | Admitting: Internal Medicine

## 2016-06-29 NOTE — Telephone Encounter (Signed)
Spoke with the pt  [facility]  She is coming back on 07/27/16  I have called and LMTCB for GemStephanie with [facility] to see if this can wait until pending ov Will await a call back

## 2016-06-30 NOTE — Telephone Encounter (Signed)
l[facility] x[facility] [place]

## 2016-07-01 NOTE — Telephone Encounter (Signed)
Spoke with Judeth CornfieldStephanie and she stated that was fine to wait until the pending appointment. Contacted the pt. And informed her as well.  Nothing further is needed at this time.

## 2016-07-27 ENCOUNTER — Other Ambulatory Visit (INDEPENDENT_AMBULATORY_CARE_PROVIDER_SITE_OTHER): Payer: Medicare Other

## 2016-07-27 ENCOUNTER — Ambulatory Visit (INDEPENDENT_AMBULATORY_CARE_PROVIDER_SITE_OTHER): Payer: Medicare Other | Admitting: Internal Medicine

## 2016-07-27 ENCOUNTER — Encounter: Payer: Self-pay | Admitting: Internal Medicine

## 2016-07-27 VITALS — BP 122/60 | HR 87 | Ht 65.0 in | Wt 298.0 lb

## 2016-07-27 DIAGNOSIS — G4733 Obstructive sleep apnea (adult) (pediatric): Secondary | ICD-10-CM

## 2016-07-27 DIAGNOSIS — Z9989 Dependence on other enabling machines and devices: Secondary | ICD-10-CM

## 2016-07-27 DIAGNOSIS — J9611 Chronic respiratory failure with hypoxia: Secondary | ICD-10-CM

## 2016-07-27 DIAGNOSIS — R0609 Other forms of dyspnea: Secondary | ICD-10-CM | POA: Diagnosis not present

## 2016-07-27 DIAGNOSIS — J849 Interstitial pulmonary disease, unspecified: Secondary | ICD-10-CM | POA: Diagnosis not present

## 2016-07-27 LAB — CBC WITH DIFFERENTIAL/PLATELET
Basophils Absolute: 0 10*3/uL (ref 0.0–0.1)
Basophils Relative: 0.4 % (ref 0.0–3.0)
Eosinophils Absolute: 0.1 10*3/uL (ref 0.0–0.7)
Eosinophils Relative: 0.9 % (ref 0.0–5.0)
HCT: 37.7 % (ref 36.0–46.0)
Hemoglobin: 12.6 g/dL (ref 12.0–15.0)
LYMPHS ABS: 2.1 10*3/uL (ref 0.7–4.0)
Lymphocytes Relative: 23.5 % (ref 12.0–46.0)
MCHC: 33.3 g/dL (ref 30.0–36.0)
MCV: 95.7 fl (ref 78.0–100.0)
MONO ABS: 0.8 10*3/uL (ref 0.1–1.0)
Monocytes Relative: 8.7 % (ref 3.0–12.0)
NEUTROS PCT: 66.5 % (ref 43.0–77.0)
Neutro Abs: 5.9 10*3/uL (ref 1.4–7.7)
PLATELETS: 230 10*3/uL (ref 150.0–400.0)
RBC: 3.94 Mil/uL (ref 3.87–5.11)
RDW: 14.4 % (ref 11.5–15.5)
WBC: 8.9 10*3/uL (ref 4.0–10.5)

## 2016-07-27 LAB — BASIC METABOLIC PANEL
BUN: 17 mg/dL (ref 6–23)
CALCIUM: 9.3 mg/dL (ref 8.4–10.5)
CO2: 30 mEq/L (ref 19–32)
CREATININE: 0.76 mg/dL (ref 0.40–1.20)
Chloride: 102 mEq/L (ref 96–112)
GFR: 81.07 mL/min (ref >60.00)
Glucose, Bld: 168 mg/dL — ABNORMAL HIGH (ref 70–99)
Potassium: 4.3 mEq/L (ref 3.5–5.1)
Sodium: 137 mEq/L (ref 135–145)

## 2016-07-27 LAB — HEPATIC FUNCTION PANEL
ALT: 19 U/L (ref 0–35)
AST: 18 U/L (ref 0–37)
Albumin: 3.7 g/dL (ref 3.5–5.2)
Alkaline Phosphatase: 59 U/L (ref 39–117)
Bilirubin, Direct: 0.1 mg/dL (ref 0.0–0.3)
Total Bilirubin: 0.4 mg/dL (ref 0.2–1.2)
Total Protein: 6.7 g/dL (ref 6.0–8.3)

## 2016-07-27 LAB — TSH: TSH: 1.51 u[IU]/mL (ref 0.35–4.50)

## 2016-07-27 LAB — BRAIN NATRIURETIC PEPTIDE: Pro B Natriuretic peptide (BNP): 191 pg/mL — ABNORMAL HIGH (ref 0.0–100.0)

## 2016-07-27 LAB — SEDIMENTATION RATE: Sed Rate: 31 mm/hr — ABNORMAL HIGH (ref 0–30)

## 2016-07-27 NOTE — Progress Notes (Signed)
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     History of Present Illness  12/05/2015 1st [place] [facility] office visit/ Devon Pretty   Chief Complaint  Patient presents with  . Pulmonary Consult    Self referral. Pt states dxed with Emphysema in 2011. She has been seen at [facility] in the past. She c/o hoarseness and increased cough for the past yr.  Cough is prod with white to tan sputum.  She also c/o SOB with walking long distances and up stairs. Hot weather tends to make her breathing worse. She uses ventolin 2 x per day on average.   indolent onset doe x 6 y better on tudorza and spiriva (mouth dry) and no change on symbicort  Needs saba at least twice daily  Already did rehab with wt = ? But did well  Doe = MMRC3 = can't walk 100 yards even at a slow pace at a flat grade s stopping due to sob   Cough p stopped smoking got worse then better and resolved until one year prior to OV  And present daily since but never while on cpap / neg w/u reflux by GI but ent disagreed  rec Plan A = Automatic = symbicort 160 Take 2 puffs first thing in am and then another 2 puffs about 12 hours later / tudorza one twice daily  Plan B = Backup Only use your albuterol as a rescue medication GERD diet  For drainage / throat tickle try take CHLORPHENIRAMINE  4 mg - take one every 4 hours as needed     01/07/16 acute w/in with NP breathing a lot worse  Prednisone taper  Doxycyline x 7 days  Continue symbicort, tudorza  albuterol as needed  Will arrange home O2  Will check some  labs today  Will arrange an echocardiogram and cardiology referral     02/10/2016  Extended f/u ov/Navreet Bolda re: COPD 0/ probable ILD ? Macrodantin related ? When last dose ?  Chief Complaint  Patient presents with  . Follow-up    Cough and SOB had improved on pred and doxy and then worsened a few days after finished meds. Cough is prod and mucus gets hung in her throat. She has not needed to use albuterol since starting o2.   doe x MMRC2 = can't walk a nl pace on a flat grade s sob but does fine slow and flat eg  shopping on 02/ main issue is hacking cough and sensation of globus day >> noct rec Call us with the dates you took macrodantin and do not take this again > last rx  12/17/15 x 7 d only   Prednisone 10 mg take  4 each am x 2 days,   2 each am x 2 days,  1 each am x 2 days and stop    03/23/2016  f/u ov/Brennen Camper re:   ILD / macrodantin exp with high ESR  Chief Complaint  Patient presents with  . Follow-up  Breathing is doing well. She has been using 2lpm with rest and 3 with exertion. Her cough has improved some but she is still c/o hoarseness.      some better while on prednisone only and worse cough/ sob after stopped  rec Prednisone 10 mg  Take 2 daily until better then reduce to one daily x 2 weeks then one half daily     05/04/2016  f/u ov/Jestin Burbach re: ILD ? Macrodantin on 5 mg daily  Chief Complaint  Patient presents with  . Follow-up    4 week follow up. Breathing has been ok. Was bothered by allergies around 3 weeks ago with a cough and fatigue.   mmrc = 2 = MMRC2 = can't walk a nl pace on a flat grade s sob but does fine slow and flat eg does fine walking on 3lpm leaning a cart / cough has resolved  rec Please remember to go to the lab  department downstairs for your tests - we will call you with the results when they are available. Schedule High Resolution CT chest and PFT on same day in next two weeks at [facility]  Highest prednisone dose is 20 mg and the lowest   Is 5 mg  Avoid food that high in fat or carbs  Avoid fried foods/ starchy foods and sweets.  Please schedule a follow up office visit in 6 weeks, call sooner if needed  - consider wean off symbicort as this is not copd   06/01/16 Uri/ doxy 10 > back to baseline     06/15/2016  f/u ov/Ezell Poke re: PF on pred 10 mg daily  X 6 days p taper from 20 on 02 2lpm  / copd 0 on symb 160 2bid  Chief Complaint  Patient presents with  . Follow-up    Pt is following up for ILD.   doe = 3lpm leaning on cart can do HT only = MMRC3 = can't walk 100 yards even at a slow pace at a flat grade s stopping due to sob  / never uses saba prior to ex / can't tell symbicort helps when misses doses Doing recumbant bike x 10 min no resistance not checking sats/ very little insight   rec Try to build up duration on bike at least 20 minutes before adding resistance stop symbicort  Prednisone 10 mg  Take 2 daily until better then reduce to one daily x 2 weeks then one half daily  GERD diet    07/27/2016  f/u ov/Rudy Domek re: PF ? Steroid resp now on 5 mg daily  Chief Complaint  Patient presents with  . Follow-up    Breathing is unchanged. She has been on pred 5 mg for the past 2.5 wks.    worse leg swelling since stopped diuretic/ no worse sob as tapered pred  Doe still = MMRC3 but also limited by knees     No obvious day to day or daytime variability or assoc excess/ purulent sputum or mucus plugs or hemoptysis or cp or chest tightness, subjective wheeze or overt sinus or hb symptoms. No unusual exp hx or h/o childhood pna/ asthma or knowledge of premature birth.  Sleeping ok on cpap  without nocturnal  or early am exacerbation  of respiratory  c/o's or need for noct saba. Also denies any obvious fluctuation of symptoms with weather or environmental changes or other aggravating or alleviating factors except as outlined above   Current Medications, Allergies, Complete Past Medical History,  Past Surgical History, Family  History, and Social History were reviewed in [employer] record.  ROS  The following are not active complaints unless bolded sore throat, dysphagia, dental problems, itching, sneezing,  nasal congestion or excess/ purulent secretions, ear ache,   fever, chills, sweats, unintended wt loss, classically pleuritic or exertional cp,  orthopnea pnd or leg swelling, presyncope, palpitations, abdominal pain, anorexia, nausea, vomiting, diarrhea  or change in bowel or bladder habits, change in stools or urine, dysuria,hematuria,  rash, arthralgias L knee , visual complaints, headache, numbness, weakness or ataxia or problems with walking or coordination,  change in mood/affect or memory.                Objective:   Physical Exam  Obese amb wf nad   07/27/2016      298  05/04/2016        290 03/23/2016     278   02/10/16 276 lb (125.2 kg)  01/07/16 274 lb (124.3 kg)  12/05/15 279 lb 3.2 oz (126.6 kg)     Vital signs reviewed  -   HEENT: nl dentition, turbinates, and oropharynx. Nl external ear canals without cough reflex   NECK :  without JVD/Nodes/TM/ nl carotid upstrokes bilaterally   LUNGS: no acc muscle use,  Nl contour chest with very minimal insp crackles bilaterally in bases/ distant bs    CV:  RRR  no s3 or murmur or increase in P2, trace bilateral low ext pitting sym edema   ABD:  soft and nontender with nl inspiratory excursion in the supine position. No bruits or organomegaly, bowel sounds nl  MS:  Nl gait/ ext warm without deformities, calf tenderness, cyanosis or clubbing No obvious joint restrictions   SKIN: warm and dry without lesions    NEURO:  alert, approp, nl sensorium with  no motor deficits  Labs ordered/ reviewed:      Chemistry      Component Value Date/Time   NA 137 07/27/2016 1631   K 4.3 07/27/2016 1631   CL 102 07/27/2016 1631   CO2 30 07/27/2016 1631   BUN 17 07/27/2016 1631   CREATININE 0.76 07/27/2016 1631      Component  Value Date/Time   CALCIUM 9.3 07/27/2016 1631   ALKPHOS 59 07/27/2016 1631   AST 18 07/27/2016 1631   ALT 19 07/27/2016 1631   BILITOT 0.4 07/27/2016 1631        Lab Results  Component Value Date   WBC 8.9 07/27/2016   HGB 12.6 07/27/2016   HCT 37.7 07/27/2016   MCV 95.7 07/27/2016   PLT 230.0 07/27/2016         Lab Results  Component Value Date   TSH 1.51 07/27/2016     Lab Results  Component Value Date   PROBNP 191.0 (H) 07/27/2016            Lab 07/27/2016   ESR = 31 Lab Results  Component Value Date   ESRSEDRATE 26 06/15/2016   ESRSEDRATE 65 (H) 05/04/2016   ESRSEDRATE 45 (H) 03/23/2016         I personally reviewed images and agree with radiology impression as follows:  hrct Chest  05/18/16 1. Moderate fibrotic interstitial lung disease characterized by patchy subpleural and peribronchovascular reticulation and ground-glass attenuation, mild traction bronchiectasis, mild architectural distortion and apparent upper lobe honeycombing. Findings appear to have progressed compared to the oldest available chest radiograph of 07/18/2014. The absence of a basilar gradient and  the presence of mild air trapping suggest a diagnosis of chronic hypersensitivity pneumonitis, although usual interstitial pneumonia (UIP) cannot be excluded     Assessment & Plan:

## 2016-07-27 NOTE — Patient Instructions (Addendum)
Take your fluid pill from your primary care doctor as needed for leg swelling   Decrease prednisone to 5 mg every other day x 2 weeks and stop and see if you note any change in symptoms  Please remember to go to the lab  department downstairs in the basement  for your tests - we will call you with the results when they are available.  Change 02 to 2lpm with cpap at bedtime and 3 lpm walking more than around the house and certainly with exercise but not need to take any longer at rest unless you feel you need it to keep sats over 90%    Please schedule a follow up office visit in 6 weeks, call sooner if needed

## 2016-07-27 NOTE — Progress Notes (Signed)
Spoke with pt and notified of results per Dr. Wert. Pt verbalized understanding and denied any questions. 

## 2016-07-28 ENCOUNTER — Encounter: Payer: Self-pay | Admitting: Internal Medicine

## 2016-07-28 NOTE — Assessment & Plan Note (Signed)
Body mass index is 49.59  Trending up  Lab Results  Component Value Date   TSH 1.51 07/27/2016     Contributing to gerd risk/ doe/reviewed the need and the process to achieve and maintain neg calorie balance > defer f/u primary care including intermittently monitoring thyroid status

## 2016-07-28 NOTE — Assessment & Plan Note (Signed)
Continue CPAP per Dr Chewing, may want to repeat ONO on CPAP to see if still need 02 on optimal cpap

## 2016-07-28 NOTE — Assessment & Plan Note (Signed)
Multifactorial with mild ILD/ morbid obesity driving most of her symptoms at this point though bnp is intermediate > advised to continue to take diuretic prn swelling per PCP and f/u at that office

## 2016-07-28 NOTE — Assessment & Plan Note (Signed)
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  The goal with a chronic steroid dependent illness is always arriving at the lowest effective dose that controls the disease/symptoms and not accepting a set "formula" which is based on statistics or guidelines that don't always take into account patient  variability or the natural hx of the dz in every individual patient, which may well vary over time.  For now therefore I recommend the patient see if she can wean completely off at this point  I had an extended discussion with the patient reviewing all relevant studies completed to date and  lasting 15 to 20 minutes of a 25 minute visit    Each maintenance medication was reviewed in detail including most importantly the difference between maintenance and prns and under what circumstances the prns are to be triggered using an action plan format that is not reflected in the computer generated alphabetically organized AVS.    Please see AVS for specific instructions unique to this visit that I personally wrote and verbalized to the the pt in detail and then reviewed with pt  by my nurse highlighting any  changes in therapy recommended at today's visit to their plan of care.

## 2016-07-28 NOTE — Assessment & Plan Note (Signed)
SATURATION QUALIFICATIONS:  02/10/2016  Patient Saturations on Room Air at Rest = 90% Patient Saturations on Room Air while Ambulating = 83% Patient Saturations on 3 Liters of oxygen while Ambulating = 90% - 07/27/2016   Walked RA  2 laps @ 185 ft each stopped due to  desat to 88% RA   rx as of 07/27/2016  =   3lpm with walking / ex (goal is to keep > 90%) no need at rest / 2lpm hs with cpap until / unless do ono on cpap s 02

## 2016-08-17 ENCOUNTER — Telehealth: Payer: Self-pay | Admitting: Internal Medicine

## 2016-08-17 NOTE — Telephone Encounter (Signed)
[facility]  She states needing qualifying o2 sats on this pt  [facility] which [facility] s[facility]:  Christen ButterLeslie M Cervando Brady, CMA Tue Jul 27, 2016 4:11 PM  SATURATION QUALIFICATIONS: (This note is used to comply with regulatory documentation for home oxygen)- Lindsay MurdersLeslie Nadirah Brady, CMA 07/27/16  Patient Saturations on Room Air at Rest = 95%  Patient Saturations on Room Air while Ambulating = 88%  Patient Saturations on 3 Liters of oxygen while Ambulating = 94%   I have faxed this to her at [phone]  Nothing further needed

## 2016-09-08 ENCOUNTER — Encounter: Payer: Self-pay | Admitting: Internal Medicine

## 2016-09-08 ENCOUNTER — Ambulatory Visit (INDEPENDENT_AMBULATORY_CARE_PROVIDER_SITE_OTHER)
Admission: RE | Admit: 2016-09-08 | Discharge: 2016-09-08 | Disposition: A | Discharge status: Home / Self Care | Payer: Medicare Other | Source: Ambulatory Visit | Attending: Internal Medicine | Admitting: Internal Medicine

## 2016-09-08 ENCOUNTER — Other Ambulatory Visit (INDEPENDENT_AMBULATORY_CARE_PROVIDER_SITE_OTHER): Payer: Medicare Other

## 2016-09-08 ENCOUNTER — Ambulatory Visit (INDEPENDENT_AMBULATORY_CARE_PROVIDER_SITE_OTHER): Payer: Medicare Other | Admitting: Internal Medicine

## 2016-09-08 VITALS — BP 122/72 | HR 74 | Ht 65.0 in | Wt 293.0 lb

## 2016-09-08 DIAGNOSIS — J449 Chronic obstructive pulmonary disease, unspecified: Secondary | ICD-10-CM

## 2016-09-08 DIAGNOSIS — R05 Cough: Secondary | ICD-10-CM | POA: Diagnosis not present

## 2016-09-08 DIAGNOSIS — J849 Interstitial pulmonary disease, unspecified: Secondary | ICD-10-CM

## 2016-09-08 DIAGNOSIS — J9611 Chronic respiratory failure with hypoxia: Secondary | ICD-10-CM

## 2016-09-08 DIAGNOSIS — R058 Other specified cough: Secondary | ICD-10-CM

## 2016-09-08 LAB — CBC WITH DIFFERENTIAL/PLATELET
BASOS PCT: 0.5 % (ref 0.0–3.0)
Basophils Absolute: 0 10*3/uL (ref 0.0–0.1)
EOS ABS: 0.1 10*3/uL (ref 0.0–0.7)
EOS PCT: 1.5 % (ref 0.0–5.0)
HEMATOCRIT: 40.8 % (ref 36.0–46.0)
Hemoglobin: 13.7 g/dL (ref 12.0–15.0)
Lymphocytes Relative: 40.9 % (ref 12.0–46.0)
Lymphs Abs: 3.1 10*3/uL (ref 0.7–4.0)
MCHC: 33.5 g/dL (ref 30.0–36.0)
MCV: 95.7 fl (ref 78.0–100.0)
MONO ABS: 0.8 10*3/uL (ref 0.1–1.0)
Monocytes Relative: 10.4 % (ref 3.0–12.0)
NEUTROS ABS: 3.6 10*3/uL (ref 1.4–7.7)
Neutrophils Relative %: 46.7 % (ref 43.0–77.0)
PLATELETS: 247 10*3/uL (ref 150.0–400.0)
RBC: 4.27 Mil/uL (ref 3.87–5.11)
RDW: 13.9 % (ref 11.5–15.5)
WBC: 7.6 10*3/uL (ref 4.0–10.5)

## 2016-09-08 LAB — SEDIMENTATION RATE: SED RATE: 34 mm/h — AB (ref 0–30)

## 2016-09-08 NOTE — Progress Notes (Signed)
Subjective:   Patient ID: Lindsay Brady, female    DOB: 11-12-1950    MRN: [id number]    Brief patient profile:  65 yowf quit smoking 1998 at wt around 200 with some am cough and did fine s rx but in 2011 noted doe across parking lot where worked and dx as copd by [employer] and self referred to pulmonary clinic 12/05/2015 as shifting all her care to [place] with new dx of ILD 02/10/2016 and no evidence of airflow ostruction on symb 160 2bid with poor baseline hfa technique.     History of Present Illness  12/05/2015 1st [place] [facility] office visit/ Janki Dike   Chief Complaint  Patient presents with  . Pulmonary Consult    Self referral. Pt states dxed with Emphysema in 2011. She has been seen at [facility] in the past. She c/o hoarseness and increased cough for the past yr.  Cough is prod with white to tan sputum.  She also c/o SOB with walking long distances and up stairs. Hot weather tends to make her breathing worse. She uses ventolin 2 x per day on average.   indolent onset doe x 6 y better on tudorza and spiriva (mouth dry) and no change on symbicort  Needs saba at least twice daily  Already did rehab with wt = ? But did well  Doe = MMRC3 = can't walk 100 yards even at a slow pace at a flat grade s stopping due to sob   Cough p stopped smoking got worse then better and resolved until one year prior to OV  And present daily since but never while on cpap / neg w/u reflux by GI but ent disagreed  rec Plan A = Automatic = symbicort 160 Take 2 puffs first thing in am and then another 2 puffs about 12 hours later / tudorza one twice daily  Plan B = Backup Only use your albuterol as a rescue medication GERD diet  For drainage / throat tickle try take CHLORPHENIRAMINE  4 mg - take one every 4 hours as needed     01/07/16 acute w/in with NP breathing a lot worse  Prednisone taper  Doxycyline x 7 days  Continue symbicort, tudorza  albuterol as needed  Will arrange home O2  Will check some  labs today  Will arrange an echocardiogram and cardiology referral     02/10/2016  Extended f/u ov/Brandilee Pies re: COPD 0/ probable ILD ? Macrodantin related ? When last dose ?  Chief Complaint  Patient presents with  . Follow-up    Cough and SOB had improved on pred and doxy and then worsened a few days after finished meds. Cough is prod and mucus gets hung in her throat. She has not needed to use albuterol since starting o2.   doe x MMRC2 = can't walk a nl pace on a flat grade s sob but does fine slow and flat eg  shopping on 02/ main issue is hacking cough and sensation of globus day >> noct rec Call us with the dates you took macrodantin and do not take this again > last rx  12/17/15 x 7 d only   Prednisone 10 mg take  4 each am x 2 days,   2 each am x 2 days,  1 each am x 2 days and stop    03/23/2016  f/u ov/Linzy Darling re:   ILD / macrodantin exp with high ESR  Chief Complaint  Patient presents with  . Follow-up  Breathing is doing well. She has been using 2lpm with rest and 3 with exertion. Her cough has improved some but she is still c/o hoarseness.      some better while on prednisone only and worse cough/ sob after stopped  rec Prednisone 10 mg  Take 2 daily until better then reduce to one daily x 2 weeks then one half daily     05/04/2016  f/u ov/Mariel Gaudin re: ILD ? Macrodantin on 5 mg daily  Chief Complaint  Patient presents with  . Follow-up    4 week follow up. Breathing has been ok. Was bothered by allergies around 3 weeks ago with a cough and fatigue.   mmrc = 2 = MMRC2 = can't walk a nl pace on a flat grade s sob but does fine slow and flat eg does fine walking on 3lpm leaning a cart / cough has resolved  rec Please remember to go to the lab  department downstairs for your tests - we will call you with the results when they are available. Schedule [facility] CT chest and PFT on same day in next two weeks at [facility]  Highest prednisone dose is 20 mg and the lowest   Is 5 mg  Avoid food that high in fat or carbs  Avoid fried foods/ starchy foods and sweets.  Please schedule a follow up office visit in 6 weeks, call sooner if needed  - consider wean off symbicort as this is not copd   06/01/16 Uri/ doxy 10 > back to baseline     06/15/2016  f/u ov/Vitali Seibert re: PF on pred 10 mg daily  X 6 days p taper from 20 on 02 2lpm  / copd 0 on symb 160 2bid  Chief Complaint  Patient presents with  . Follow-up    Pt is following up for ILD.   doe = 3lpm leaning on cart can do HT only = MMRC3 = can't walk 100 yards even at a slow pace at a flat grade s stopping due to sob  / never uses saba prior to ex / can't tell symbicort helps when misses doses Doing recumbant bike x 10 min no resistance not checking sats/ very little insight   rec Try to build up duration on bike at least 20 minutes before adding resistance stop symbicort  Prednisone 10 mg  Take 2 daily until better then reduce to one daily x 2 weeks then one half daily  GERD diet    07/27/2016  f/u ov/Dayon Witt re: PF ? Steroid resp now on 5 mg daily  Chief Complaint  Patient presents with  . Follow-up    Breathing is unchanged. She has been on pred 5 mg for the past 2.5 wks.    worse leg swelling since stopped diuretic/ no worse sob as tapered pred  Doe still = MMRC3 but also limited by knees  rec Take your fluid pill from your primary care doctor as needed for leg swelling  Decrease prednisone to 5 mg every other day x 2 weeks and stop and see if you note any change in symptoms Please remember to go to the lab  department downstairs in the basement  for your tests - we will call you with the results when they are available. Change 02 to 2lpm with cpap at bedtime and 3 lpm walking more than around the house and certainly with exercise but not need to take any longer at rest unless you feel you need  it to keep sats over 90%      09/08/2016  f/u ov/Connelly Spruell re:  PF ? Steroid responsive off prednisone  Mid march 2018   Chief Complaint  Patient presents with  . Follow-up    Pt c/o wheezing for the past 2 wks. Her breathing is unchanged. She has been trying to cough more when she notices the wheezing and is producing minimal white sputum.    noct wheeze/ desats even on cpap/ 02 and not using saba and wheeze goes away on its own  No change ex tol   No obvious day to day or daytime variability or assoc excess/ purulent sputum or mucus plugs or hemoptysis or cp or chest tightness,   or overt sinus or hb symptoms. No unusual exp hx or h/o childhood pna/ asthma or knowledge of premature birth.    Also denies any obvious fluctuation of symptoms with weather or environmental changes or other aggravating or alleviating factors except as outlined above   Current Medications, Allergies, Complete Past Medical History, Past Surgical History, Family History, and Social History were reviewed in [employer] record.  ROS  The following are not active complaints unless bolded sore throat, dysphagia, dental problems, itching, sneezing,  nasal congestion or excess/ purulent secretions, ear ache,   fever, chills, sweats, unintended wt loss, classically pleuritic or exertional cp,  orthopnea pnd or leg swelling, presyncope, palpitations, abdominal pain, anorexia, nausea, vomiting, diarrhea  or change in bowel or bladder habits, change in stools or urine, dysuria,hematuria,  rash, arthralgias, visual complaints, headache, numbness, weakness or ataxia or problems with walking or coordination,  change in mood/affect or memory.                       Objective:   Physical Exam  Obese amb wf nad   09/08/2016       294   07/27/2016      298  05/04/2016        290 03/23/2016     278   02/10/16 276 lb (125.2 kg)  01/07/16 274 lb (124.3 kg)  12/05/15 279 lb 3.2 oz (126.6 kg)     Vital signs reviewed  - - Note on arrival 02 sats  94% on   2lpm  HEENT: nl dentition, turbinates, and oropharynx. Nl external  ear canals without cough reflex   NECK :  without JVD/Nodes/TM/ nl carotid upstrokes bilaterally   LUNGS: no acc muscle use,  Nl contour chest with  Distant bs, min insp crackles and no exp wheezing / rhonchi - some pseudowheeze  CV:  RRR  no s3 or murmur or increase in P2,   bilateral low ext pitting  edema L > R  ABD:  soft and nontender with nl inspiratory excursion in the supine position. No bruits or organomegaly, bowel sounds nl  MS:  Nl gait/ ext warm without deformities, calf tenderness, cyanosis or clubbing No obvious joint restrictions   SKIN: warm and dry without lesions    NEURO:  alert, approp, nl sensorium with  no motor deficits    CXR PA and Lateral:   09/08/2016 :    I personally reviewed images and agree with radiology impression as follows:   No change mild coarse bilateral reticular ILD pattern   Labs   Ordered/ reviewed  HSP profile 09/08/2016   Lab 09/08/2016   ESR = 34  Off pred x mid march 2018  Lab 07/27/2016   ESR =  31 Lab Results  Component Value Date   ESRSEDRATE 26 06/15/2016   ESRSEDRATE 65 (H) 05/04/2016   ESRSEDRATE 45 (H) 03/23/2016              Assessment & Plan:

## 2016-09-08 NOTE — Patient Instructions (Addendum)
Please remember to go to the lab and x-ray department downstairs in the basement  for your tests - we will call you with the results when they are available.  Try prilosec otc   Take 30-60 min before first meal of the day and Pepcid ac (famotidine) 20 mg one @  bedtime until wheeze  is completely gone for at least a week or until you return, whichever comes first  GERD (REFLUX)  is an extremely common cause of respiratory symptoms just like yours , many times with no obvious heartburn at all.    It can be treated with medication, but also with lifestyle changes including elevation of the head of your bed (ideally with 6 inch  bed blocks),  Smoking cessation, avoidance of late meals, excessive alcohol, and avoid fatty foods, chocolate, peppermint, colas, red wine, and acidic juices such as orange juice.  NO MINT OR MENTHOL PRODUCTS SO NO COUGH DROPS  USE SUGARLESS CANDY INSTEAD (Jolley ranchers or Stover's or Life Savers) or even ice chips will also do - the key is to swallow to prevent all throat clearing. NO OIL BASED VITAMINS - use powdered substitutes.    Please schedule a follow up office visit in 4 weeks, sooner if needed

## 2016-09-09 NOTE — Assessment & Plan Note (Signed)
Trial of gerd diet/ 1st gen H1 12/05/2015   - trial off tudorza 02/10/2016   - flare with pseudowheeze at ov 09/08/2016 > rec max gerd rx - Spirometry 09/08/2016  No obst s rx prior  Upper airway cough syndrome (previously labeled PNDS) , is  so named because it's frequently impossible to sort out how much is  CR/sinusitis with freq throat clearing (which can be related to primary GERD)   vs  causing  secondary (" extra esophageal")  GERD from wide swings in gastric pressure that occur with throat clearing, often  promoting self use of mint and menthol lozenges that reduce the lower esophageal sphincter tone and exacerbate the problem further in a cyclical fashion.   These are the same pts (now being labeled as having "irritable larynx syndrome" by some cough centers) who not infrequently have a history of having failed to tolerate ace inhibitors,  dry powder inhalers or biphosphonates or report having atypical/extraesophageal reflux symptoms that don't respond to standard doses of PPI  and are easily confused as having aecopd or asthma flares by even experienced allergists/ pulmonologists (myself included).   For now rec max rx for gerd/ prn saba and f/ at 4 weeks

## 2016-09-09 NOTE — Assessment & Plan Note (Signed)
SATURATION QUALIFICATIONS:  02/10/2016  Patient Saturations on Room Air at Rest = 90% Patient Saturations on Room Air while Ambulating = 83% Patient Saturations on 3 Liters of oxygen while Ambulating = 90% - 07/27/2016   Walked RA  2 laps @ 185 ft each stopped due to  desat to 88% RA   rx as of 09/08/2016  =   3lpm with walking / ex (goal is to keep > 90%)  2lpm and rest and hs with cpap until / unless do ono on cpap s 02

## 2016-09-09 NOTE — Assessment & Plan Note (Signed)
Body mass index is 48.76 kg/m.  -  trending down off pred, encouraged Lab Results  Component Value Date   TSH 1.51 07/27/2016     Contributing to gerd risk/ doe/reviewed the need and the process to achieve and maintain neg calorie balance > defer f/u primary care including intermittently monitoring thyroid status

## 2016-09-09 NOTE — Assessment & Plan Note (Signed)
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 -  12/12/14  FVC = 2.11 - 06/19/15   FVC = 2.16  -  PFTs 05/18/16  FVC 2.07 (63%) no obst and dlco 52 corrects to 98 on 10 mg daily pred with esr 26 -  ESR 07/27/2016  =   31  @ pred 5 mg daily > wean off pred by mid March 2018 and no change in ESR 09/08/2016  - .HSP profile 09/08/2016 >>>   Regardless of the cause of the ILD, which I strongly suspect was macrodantin but can't prove, there was a correlation / resp to prednisone and doing some worse off it x 3 weeks but not enough to warrant restarting esp because some of her symptoms may just be related to gerd/ obesity which will certainly worsen back on it

## 2016-09-13 DIAGNOSIS — M47816 Spondylosis without myelopathy or radiculopathy, lumbar region: Secondary | ICD-10-CM | POA: Insufficient documentation

## 2016-09-13 LAB — HYPERSENSITIVITY PNUEMONITIS PROFILE

## 2016-09-14 NOTE — Progress Notes (Signed)
Spoke with pt and notified of results per Dr. Wert. Pt verbalized understanding and denied any questions. 

## 2016-10-08 ENCOUNTER — Encounter: Payer: Self-pay | Admitting: Internal Medicine

## 2016-10-08 ENCOUNTER — Ambulatory Visit (INDEPENDENT_AMBULATORY_CARE_PROVIDER_SITE_OTHER): Payer: Medicare Other | Admitting: Internal Medicine

## 2016-10-08 VITALS — BP 120/70 | HR 73 | Ht 65.0 in | Wt 286.0 lb

## 2016-10-08 DIAGNOSIS — J9611 Chronic respiratory failure with hypoxia: Secondary | ICD-10-CM

## 2016-10-08 DIAGNOSIS — R05 Cough: Secondary | ICD-10-CM

## 2016-10-08 DIAGNOSIS — J849 Interstitial pulmonary disease, unspecified: Secondary | ICD-10-CM | POA: Diagnosis not present

## 2016-10-08 DIAGNOSIS — R058 Other specified cough: Secondary | ICD-10-CM

## 2016-10-08 MED ORDER — ALBUTEROL SULFATE HFA 108 (90 BASE) MCG/ACT IN AERS: 2.0000 | INHALATION_SPRAY | Freq: Four times a day (QID) | RESPIRATORY_TRACT | 1 refills | Status: DC | PRN

## 2016-10-08 NOTE — Patient Instructions (Addendum)
Only use your albuterol (ventolin )as a rescue medication to be used if you can't catch your breath by resting or doing a relaxed purse lip breathing pattern.  - The less you use it, the better it will work when you need it. - Ok to use up to 2 puffs  every 4 hours if you must but call for immediate appointment if use goes up over your usual need - Don't leave home without it !!  (think of it like the spare tire for your car)   Continue 02 2lpm at bedtime on cpap  and when walking farther/faster than you did for us today   To get the most out of exercise, you need to be continuously aware that you are short of breath, but never out of breath, for 30 minutes daily. As you improve, it will actually be easier for you to do the same amount of exercise  in  30 minutes so always push to the level where you are short of breath.    Please schedule a follow up visit in 3 months but call sooner if needed

## 2016-10-08 NOTE — Progress Notes (Signed)
Subjective:   Patient ID: Lindsay Brady, female    DOB: 31-May-1951    MRN: [id number]    Brief patient profile:  65 yowf quit smoking 1998 at wt around 200 with some am cough and did fine s rx but in 2011 noted doe across parking lot where worked and dx as copd by [employer] and self referred to pulmonary clinic 12/05/2015 as shifting all her care to [facility] with new dx of ILD 02/10/2016 p macrodantin exposure and no evidence of airflow ostruction on symb 160 2bid with poor baseline hfa technique.     History of Present Illness  12/05/2015 1st [facility] office visit/ Lindsay Brady   Chief Complaint  Patient presents with  . Pulmonary Consult    Self referral. Pt states dxed with Emphysema in 2011. She has been seen at [facility] in the past. She c/o hoarseness and increased cough for the past yr.  Cough is prod with white to tan sputum.  She also c/o SOB with walking long distances and up stairs. Hot weather tends to make her breathing worse. She uses ventolin 2 x per day on average.   indolent onset doe x 6 y better on tudorza and spiriva (mouth dry) and no change on symbicort  Needs saba at least twice daily  Already did rehab with wt = ? But did well  Doe = MMRC3 = can't walk 100 yards even at a slow pace at a flat grade s stopping due to sob   Cough p stopped smoking got worse then better and resolved until one year prior to OV  And present daily since but never while on cpap / neg w/u reflux by GI but ent disagreed  rec Plan A = Automatic = symbicort 160 Take 2 puffs first thing in am and then another 2 puffs about 12 hours later / tudorza one twice daily  Plan B = Backup Only use your albuterol as a rescue medication GERD diet  For drainage / throat tickle try take CHLORPHENIRAMINE  4 mg - take one every 4 hours as needed     01/07/16 acute w/in with NP breathing a lot worse  Prednisone taper  Doxycyline x 7 days  Continue symbicort, tudorza  albuterol as needed  Will arrange  home O2  Will check some labs today  Will arrange an echocardiogram and cardiology referral     02/10/2016  Extended f/u ov/Lindsay Brady re: COPD 0/ probable ILD ? Macrodantin related ? When last dose ?  Chief Complaint  Patient presents with  . Follow-up    Cough and SOB had improved on pred and doxy and then worsened a few days after finished meds. Cough is prod and mucus gets hung in her throat. She has not needed to use albuterol since starting o2.   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  Follow-up    Breathing is doing well. She has been using 2lpm with rest and 3 with exertion. Her cough has improved some but she is still c/o hoarseness.      some better while on prednisone only and worse cough/ sob after stopped  rec Prednisone 10 mg  Take 2 daily until better then reduce to one daily x 2 weeks then one half daily     05/04/2016  f/u ov/Lindsay Brady re: ILD ? Macrodantin on 5 mg daily  Chief Complaint  Patient presents with  . Follow-up    4 week follow up. Breathing has been ok. Was bothered by allergies around 3 weeks ago with a cough and fatigue.   mmrc = 2 = MMRC2 = can't walk a nl pace on a flat grade s sob but does fine slow and flat eg does fine walking on 3lpm leaning a cart / cough has resolved  rec Please remember to go to the lab  department downstairs for your tests - we will call you with the results when they are available. Schedule High Resolution CT chest and PFT on same day in next two weeks at [facility]  Highest prednisone  dose is 20 mg and the lowest  Is 5 mg  Avoid food that high in fat or carbs  Avoid fried foods/ starchy foods and sweets.  Please schedule a follow up office visit in 6 weeks, call sooner if needed  - consider wean off symbicort as this is not copd   06/01/16 Uri/ doxy 10 > back to baseline     06/15/2016  f/u ov/Lindsay Brady re: PF on pred 10 mg daily  X 6 days p taper from 20 on 02 2lpm  / copd 0 on symb 160 2bid  Chief Complaint  Patient presents with  . Follow-up    Pt is following up for ILD.   doe = 3lpm leaning on cart can do HT only = MMRC3 = can't walk 100 yards even at a slow pace at a flat grade s stopping due to sob  / never uses saba prior to ex / can't tell symbicort helps when misses doses Doing recumbant bike x 10 min no resistance not checking sats/ very little insight   rec Try to build up duration on bike at least 20 minutes before adding resistance stop symbicort  Prednisone 10 mg  Take 2 daily until better then reduce to one daily x 2 weeks then one half daily  GERD diet    07/27/2016  f/u ov/Lindsay Brady re: PF ? Steroid resp now on 5 mg daily  Chief Complaint  Patient presents with  . Follow-up    Breathing is unchanged. She has been on pred 5 mg for the past 2.5 wks.    worse leg swelling since stopped diuretic/ no worse sob as tapered pred  Doe still = MMRC3 but also limited by knees  rec Take your fluid pill from your primary care doctor as needed for leg swelling  Decrease prednisone to 5 mg every other day x 2 weeks and stop and see if you note any change in symptoms Please remember to go to the lab  department downstairs in the basement  for your tests - we will call you with the results when they are available. Change 02 to 2lpm with cpap at bedtime and 3 lpm walking more than around the house and certainly with exercise but not need to take any longer at rest unless  you feel you need it to keep sats over 90%      09/08/2016  f/u ov/Lindsay Brady re:  PF ? Steroid responsive  off prednisone  Mid march 2018  Chief Complaint  Patient presents with  . Follow-up    Pt c/o wheezing for the past 2 wks. Her breathing is unchanged. She has been trying to cough more when she notices the wheezing and is producing minimal white sputum.    noct wheeze/ desats even on cpap/ 02 and not using saba and wheeze goes away on its own  No change ex tol  rec Try prilosec otc 68m  Take 30-60 min before first meal of the day and Pepcid ac (famotidine) 20 mg one @  bedtime until wheeze  is completely gone for at least a week or until you return, whichever comes first GERD diet      10/08/2016  f/u ov/Lindsay Brady re:  PF steroid resp/ no flare off prednisone, no need for saba  Chief Complaint  Patient presents with  . Follow-up    Breathing is about the same. No new co's today. She has not used albuterol.     Not limited by breathing from desired activities    No obvious day to day or daytime variability or assoc excess/ purulent sputum or mucus plugs or hemoptysis or cp or chest tightness, subjective wheeze or overt sinus or hb symptoms. No unusual exp hx or h/o childhood pna/ asthma or knowledge of premature birth.  Sleeping ok without nocturnal  or early am exacerbation  of respiratory  c/o's or need for noct saba. Also denies any obvious fluctuation of symptoms with weather or environmental changes or other aggravating or alleviating factors except as outlined above   Current Medications, Allergies, Complete Past Medical History, Past Surgical History, Family History, and Social History were reviewed in [employer].  ROS  The following are not active complaints unless bolded sore throat, dysphagia, dental problems, itching, sneezing,  nasal congestion or excess/ purulent secretions, ear ache,   fever, chills, sweats, unintended wt loss, classically pleuritic or exertional cp,  orthopnea pnd or leg swelling improved, presyncope, palpitations, abdominal pain,  anorexia, nausea, vomiting, diarrhea  or change in bowel or bladder habits, change in stools or urine, dysuria,hematuria,  rash, arthralgias, visual complaints, headache, numbness, weakness or ataxia or problems with walking or coordination,  change in mood/affect or memory.                                 Objective:   Physical Exam  Obese amb wf nad   10/08/2016       286  09/08/2016       294   07/27/2016      298  05/04/2016        290 03/23/2016     278   02/10/16 276 lb (125.2 kg)  01/07/16 274 lb (124.3 kg)  12/05/15 279 lb 3.2 oz (126.6 kg)     Vital signs reviewed  - - Note on arrival 02 sats  96% on RA     HEENT: nl dentition, turbinates, and oropharynx. Nl external ear canals without cough reflex   NECK :  without JVD/Nodes/TM/ nl carotid upstrokes bilaterally   LUNGS: no acc muscle use,  Nl contour chest with  Distant bs,clear to A and P   CV:  RRR  no s3 or murmur or increase in  P2,   Trace lower  ext pitting  edema L > R    ABD:  soft and nontender with nl inspiratory excursion in the supine position. No bruits or organomegaly, bowel sounds nl  MS:  Nl gait/ ext warm without deformities, calf tenderness, cyanosis or clubbing No obvious joint restrictions   SKIN: warm and dry without lesions    NEURO:  alert, approp, nl sensorium with  no motor deficits           Assessment & Plan:

## 2016-10-09 NOTE — Assessment & Plan Note (Signed)
SATURATION QUALIFICATIONS:  02/10/2016  Patient Saturations on Room Air at Rest = 90% Patient Saturations on Room Air while Ambulating = 83% Patient Saturations on 3 Liters of oxygen while Ambulating = 90% - 07/27/2016   Walked RA  2 laps @ 185 ft each stopped due to  desat to 88% RA - 10/08/2016   Walked RA  2 laps @ 185 ft each stopped due to sob/ sats 88% fast pace    rx as of 10/08/2016  =  2lpm with walking / ex (goal is to keep > 90%)  2lpm and rest and hs with cpap until / unless do ono on cpap s 02

## 2016-10-09 NOTE — Assessment & Plan Note (Addendum)
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 -  12/12/14  FVC = 2.11 - 06/19/15   FVC = 2.16  -  PFTs 05/18/16  FVC 2.07 (63%) no obst and dlco 52 corrects to 98 on 10 mg daily pred with esr 26 -  ESR 07/27/2016  =   31  @ pred 5 mg daily > wean off pred by mid March 2018 and no change in ESR 09/08/2016  - .HSP profile 09/08/2016 >  Neg -  Spirometry 09/08/2016  FVC  1.89 off pred    Doing fine off prednisone reinforcing likelihood this was just macrodantin related and not any form of progressive ILD   Discussed in detail all the  indications, usual  risks and alternatives  relative to the benefits with patient who agrees to proceed with conservative f/u off prednisone and just following symptoms for now.  I had an extended discussion with the patient reviewing all relevant studies completed to date and  lasting 15 to 20 minutes of a 25 minute visit    Each maintenance medication was reviewed in detail including most importantly the difference between maintenance and prns and under what circumstances the prns are to be triggered using an action plan format that is not reflected in the computer generated alphabetically organized AVS.    Please see AVS for specific instructions unique to this visit that I personally wrote and verbalized to the the pt in detail and then reviewed with pt  by my nurse highlighting any  changes in therapy recommended at today's visit to their plan of care.

## 2016-10-09 NOTE — Assessment & Plan Note (Signed)
Trial of gerd diet/ 1st gen H1 12/05/2015 >>> - trial off tudorza 02/10/2016 >>> - flare with pseudowheeze at ov 09/08/2016 > rec max gerd rx - Spirometry 09/08/2016  No obst s rx prior   Resolved on gerd rx/ ok to just use prn flare and work on the underlying problem (obesity)

## 2016-10-09 NOTE — Assessment & Plan Note (Signed)
Body mass index is 47.59 kg/m.  -  trending down, encouraged Lab Results  Component Value Date   TSH 1.51 07/27/2016     Contributing to gerd risk/ doe/reviewed the need and the process to achieve and maintain neg calorie balance > defer f/u primary care including intermittently monitoring thyroid status

## 2016-10-23 ENCOUNTER — Emergency Department (HOSPITAL_COMMUNITY)
Admission: EM | Admit: 2016-10-23 | Discharge: 2016-10-24 | Disposition: A | Discharge status: Home / Self Care | Payer: Medicare Other | Attending: Emergency Medicine | Admitting: Emergency Medicine

## 2016-10-23 ENCOUNTER — Emergency Department (HOSPITAL_COMMUNITY): Payer: Medicare Other

## 2016-10-23 ENCOUNTER — Encounter (HOSPITAL_COMMUNITY): Payer: Self-pay

## 2016-10-23 DIAGNOSIS — Y929 Unspecified place or not applicable: Secondary | ICD-10-CM | POA: Diagnosis not present

## 2016-10-23 DIAGNOSIS — W010XXA Fall on same level from slipping, tripping and stumbling without subsequent striking against object, initial encounter: Secondary | ICD-10-CM | POA: Insufficient documentation

## 2016-10-23 DIAGNOSIS — S0990XA Unspecified injury of head, initial encounter: Secondary | ICD-10-CM | POA: Diagnosis not present

## 2016-10-23 DIAGNOSIS — Y939 Activity, unspecified: Secondary | ICD-10-CM | POA: Diagnosis not present

## 2016-10-23 DIAGNOSIS — Z87891 Personal history of nicotine dependence: Secondary | ICD-10-CM | POA: Insufficient documentation

## 2016-10-23 DIAGNOSIS — E119 Type 2 diabetes mellitus without complications: Secondary | ICD-10-CM | POA: Diagnosis not present

## 2016-10-23 DIAGNOSIS — S43005A Unspecified dislocation of left shoulder joint, initial encounter: Secondary | ICD-10-CM

## 2016-10-23 DIAGNOSIS — Y999 Unspecified external cause status: Secondary | ICD-10-CM | POA: Insufficient documentation

## 2016-10-23 DIAGNOSIS — Z79899 Other long term (current) drug therapy: Secondary | ICD-10-CM | POA: Diagnosis not present

## 2016-10-23 DIAGNOSIS — J449 Chronic obstructive pulmonary disease, unspecified: Secondary | ICD-10-CM | POA: Insufficient documentation

## 2016-10-23 DIAGNOSIS — S42252A Displaced fracture of greater tuberosity of left humerus, initial encounter for closed fracture: Secondary | ICD-10-CM | POA: Diagnosis not present

## 2016-10-23 DIAGNOSIS — Z7982 Long term (current) use of aspirin: Secondary | ICD-10-CM | POA: Diagnosis not present

## 2016-10-23 DIAGNOSIS — S4992XA Unspecified injury of left shoulder and upper arm, initial encounter: Secondary | ICD-10-CM | POA: Diagnosis present

## 2016-10-23 MED ORDER — MORPHINE SULFATE (PF) 2 MG/ML IV SOLN
4.0000 mg | Freq: Once | INTRAVENOUS | Status: AC
  Administered 2016-10-23: 4 mg via INTRAVENOUS
  Filled 2016-10-23: qty 2

## 2016-10-23 NOTE — ED Provider Notes (Signed)
[facility]-EMERGENCY DEPT Provider Note   CSN: [id number] Arrival date & time: 10/23/16  2121     History   Chief Complaint Chief Complaint  Patient presents with  . Fall    HPI Lindsay Brady is a 66 y.o. female with history of diabetes, COPD who presents with left shoulder pain following fall. Patient states she tripped and fell on outstretched L arm. She has severe pain in her shoulder and cannot move it without significant pain. Patient has had tingling in her L fingers since the fall Patient fell on her face as well has bruising to her nose. Patient reports she did not lose consciousness. Patient denies any dizziness or lightheadedness prior to the fall. Patient denies any chest pain, shortness of breath, abdominal pain, nausea, vomiting.  HPI  Past Medical History:  Diagnosis Date  . Anxiety   . Arthritis   . Cholecystitis 07/2014  . COPD (chronic obstructive pulmonary disease) (HCC)   . Diabetes mellitus without complication (HCC)    type 2   . Fracture 2013 ?   left leg  . GERD (gastroesophageal reflux disease)    hx of gerd  . Headache   . Hx: UTI (urinary tract infection)   . OA (osteoarthritis) of knee   . Pneumonia    hx of viral pna years ago  . Shortness of breath dyspnea   . Sleep apnea    uses cpap  . Urgency of urination     Patient Active Problem List   Diagnosis Date Noted  . Dyspnea on exertion 07/27/2016  . RBBB 03/04/2016  . OSA on CPAP 03/04/2016  . ILD (interstitial lung disease) (HCC)  c/w NSIP p macrodantin exp 02/11/2016  . Morbid (severe) obesity due to excess calories (HCC) 02/11/2016  . Chronic respiratory failure with hypoxia (HCC) 01/07/2016  . Upper airway cough syndrome 12/06/2015  . COPD GOLD 0 12/05/2015  . Acute cholecystitis 07/19/2014    Past Surgical History:  Procedure Laterality Date  . ABDOMINAL HYSTERECTOMY    . CARPAL TUNNEL RELEASE    . CHOLECYSTECTOMY N/A 07/19/2014   Procedure: LAPAROSCOPIC CHOLECYSTECTOMY WITH  INTRAOPERATIVE CHOLANGIOGRAM;  Surgeon: Violeta Gelinas, MD;  Location: [facility] OR;  Service: General;  Laterality: N/A;  . COLONOSCOPY  ?   2011  . KNEE ARTHROSCOPY    . TONSILLECTOMY      OB History    No data available       Home Medications    Prior to Admission medications   Medication Sig Start Date End Date Taking? Authorizing Provider  albuterol (PROVENTIL HFA;VENTOLIN HFA) 108 (90 Base) MCG/ACT inhaler Inhale 2 puffs into the lungs every 6 (six) hours as needed for wheezing or shortness of breath. 10/08/16  Yes Nyoka Cowden, MD  ALPHA LIPOIC ACID PO Take 1 capsule by mouth daily.   Yes [provider]  aspirin EC 81 MG tablet Take 81 mg by mouth at bedtime.   Yes [provider]  ASTRAGALUS PO Take 1 tablet by mouth every evening.   Yes [provider]  chlorpheniramine (CHLOR-TRIMETON) 4 MG tablet Take 4 mg by mouth at bedtime.   Yes [provider]  Cholecalciferol (VITAMIN D) 2000 UNITS CAPS Take 2,000 Units by mouth every evening.   Yes [provider]  cholestyramine light (PREVALITE) 4 g packet Take 4 g by mouth daily as needed (cholesterol).    Yes [provider]  Coenzyme Q10 (CO Q 10 PO) Take 1 tablet by mouth 2 (  two) times daily.    Yes [provider]  conjugated estrogens (PREMARIN) vaginal cream Place 1 Applicatorful vaginally every other day.   Yes [provider]  escitalopram (LEXAPRO) 10 MG tablet Take 10 mg by mouth at bedtime.   Yes [provider]  gabapentin (NEURONTIN) 400 MG capsule Take 400 mg by mouth at bedtime.   Yes [provider]  OXYGEN 2lpm with rest and 3lpm with exertion [facility]   Yes [provider]  simvastatin (ZOCOR) 10 MG tablet Take 10 mg by mouth every evening.   Yes [provider]  triamterene-hydrochlorothiazide (MAXZIDE-25) 37.5-25 MG tablet Take 1 tablet by mouth daily. Only takes on days she stays home 08/30/16  Yes [provider]  oxyCODONE-acetaminophen (PERCOCET/ROXICET) 5-325 MG tablet Take 1-2 tablets by mouth every 6 (six) hours as needed for severe pain. 10/24/16   Emi HolesLaw, Nicanor Mendolia M, PA-C    Family History Family History  Problem Relation Age of Onset  . Stroke Mother   . Lung cancer Father        smoked  . Colon cancer Father   . Cancer Paternal Grandmother   . Cancer Paternal Grandfather     Social History Social History  Substance Use Topics  . Smoking status: Former Smoker    Packs/day: 2.00    Years: 30.00    Types: Cigarettes    Quit date: 05/31/1996  . Smokeless tobacco: Never Used  . Alcohol use No     Comment: rare     Allergies   Erythromycin; Bactrim [sulfamethoxazole-trimethoprim]; and Fosamax  [alendronate sodium]   Review of Systems Review of Systems  Constitutional: Negative for chills and fever.  HENT: Positive for facial swelling (nose). Negative for sore throat.   Respiratory: Negative for shortness of breath.   Cardiovascular: Negative for chest pain.  Gastrointestinal: Negative for abdominal pain, nausea and vomiting.  Genitourinary: Negative for dysuria.  Musculoskeletal: Positive for arthralgias (L shoulder). Negative for back pain.  Skin: Negative for rash and wound.  Neurological: Negative for headaches.  Psychiatric/Behavioral: The patient is not nervous/anxious.      Physical Exam Updated Vital Signs BP (!) 117/58 (BP Location: Right Arm)   Pulse 78   Temp 98.7 F (37.1 C) (Oral)   Resp 20   Ht 5\' 5"  (1.651 m)   Wt 127.9 kg (282 lb)   SpO2 96%   BMI 46.93 kg/m   Physical Exam  Constitutional: She appears well-developed and well-nourished. No distress.  HENT:  Head: Normocephalic and atraumatic.  Mouth/Throat: Oropharynx is clear and moist. No oropharyngeal exudate.  Eyes: Conjunctivae and EOM are normal. Pupils are equal, round, and reactive to light. Right eye exhibits no discharge. Left eye exhibits no discharge. No scleral icterus.   Neck: Normal range of motion. Neck supple. No thyromegaly present.  Pain in left shoulder with palpation of the cervical spine  Cardiovascular: Normal rate, regular rhythm, normal heart sounds and intact distal pulses.  Exam reveals no gallop and no friction rub.   No murmur heard. Pulmonary/Chest: Effort normal and breath sounds normal. No stridor. No respiratory distress. She has no wheezes. She has no rales.  Abdominal: Soft. Bowel sounds are normal. She exhibits no distension. There is no tenderness. There is no rebound and no guarding.  Musculoskeletal: She exhibits no edema.  Significant L shoulder tenderness; patient refuses to move shoulder at all without pain; tenderness at the L elbow, may be related to shoulder pain; normal sensation to UEs  bilaterally; radial pulses intact  Full range of motion of bilateral knees and hips, no tenderness  Lymphadenopathy:    She has no cervical adenopathy.  Neurological: She is alert. Coordination normal.  CN 3-12 intact; normal sensation throughout; 5/5 strength in all 4 extremities; equal bilateral grip strength  Skin: Skin is warm and dry. No rash noted. She is not diaphoretic. No pallor.  Psychiatric: She has a normal mood and affect.  Nursing note and vitals reviewed.    ED Treatments / Results  Labs (all labs ordered are listed, but only abnormal results are displayed) Labs Reviewed - No data to display  EKG  EKG Interpretation None       Radiology Dg Elbow Complete Left  Result Date: 10/23/2016 CLINICAL DATA:  Left shoulder pain after fall today. EXAM: LEFT ELBOW - COMPLETE 3+ VIEW COMPARISON:  None. FINDINGS: There is no evidence of fracture, dislocation, or joint effusion. There is no evidence of arthropathy or other focal bone abnormality. Soft tissues are unremarkable. IMPRESSION: Negative. Electronically Signed   By: Burman Nieves M.D.   On: 10/23/2016 23:25   Ct Head Wo Contrast  Result Date: 10/23/2016 CLINICAL  DATA:  Status post fall tonight. EXAM: CT HEAD WITHOUT CONTRAST CT MAXILLOFACIAL WITHOUT CONTRAST CT CERVICAL SPINE WITHOUT CONTRAST TECHNIQUE: Multidetector CT imaging of the head, cervical spine, and maxillofacial structures were performed using the standard protocol without intravenous contrast. Multiplanar CT image reconstructions of the cervical spine and maxillofacial structures were also generated. COMPARISON:  None. FINDINGS: CT HEAD FINDINGS Brain: Mild generalized parenchymal atrophy with commensurate dilatation of the ventricles and sulci. There is no mass, hemorrhage, edema or other evidence of acute parenchymal abnormality. No extra-axial hemorrhage. Vascular: There are chronic calcified atherosclerotic changes of the large vessels at the skull base. No unexpected hyperdense vessel. Skull: Normal. Negative for fracture or focal lesion. Other: None. CT MAXILLOFACIAL FINDINGS Osseous: Lower frontal bones are intact and normally aligned. No displaced nasal bone fracture. Osseous structures about the orbits are intact and normally aligned bilaterally. Walls of the maxillary sinuses are intact and normally aligned bilaterally. Bilateral zygoma and pterygoid plates are intact. No mandible fracture or displacement. Orbits: Negative. No traumatic or inflammatory finding. Sinuses: Clear. Soft tissues: No soft tissue hematoma seen. CT CERVICAL SPINE FINDINGS Alignment: Mild reversal of the normal cervical spine lordosis. No evidence of acute vertebral body subluxation. Skull base and vertebrae: No fracture line or displaced fracture fragment. Facet joints appear intact and normally aligned throughout. Soft tissues and spinal canal: No prevertebral fluid or swelling. No visible canal hematoma. Disc levels: Mild degenerative spurring at multiple levels, but no significant central canal stenosis. Upper chest: Unremarkable. Other: Probable emphysematous change at each lung apex, incompletely imaged. Carotid  atherosclerosis. IMPRESSION: 1. No acute intracranial abnormality. No intracranial mass, hemorrhage or edema. No skull fracture. 2. No facial bone fracture or displacement. 3. No fracture or acute subluxation within the cervical spine. Mild reversal of the normal cervical spine lordosis is likely related to patient positioning and/or muscle spasm. 4. Carotid atherosclerosis. Electronically Signed   By: Bary Richard M.D.   On: 10/23/2016 23:39   Ct Cervical Spine Wo Contrast  Result Date: 10/23/2016 CLINICAL DATA:  Status post fall tonight. EXAM: CT HEAD WITHOUT CONTRAST CT MAXILLOFACIAL WITHOUT CONTRAST CT CERVICAL SPINE WITHOUT CONTRAST TECHNIQUE: Multidetector CT imaging of the head, cervical spine, and maxillofacial structures were performed using the standard protocol without intravenous contrast. Multiplanar CT image reconstructions of the cervical spine and  maxillofacial structures were also generated. COMPARISON:  None. FINDINGS: CT HEAD FINDINGS Brain: Mild generalized parenchymal atrophy with commensurate dilatation of the ventricles and sulci. There is no mass, hemorrhage, edema or other evidence of acute parenchymal abnormality. No extra-axial hemorrhage. Vascular: There are chronic calcified atherosclerotic changes of the large vessels at the skull base. No unexpected hyperdense vessel. Skull: Normal. Negative for fracture or focal lesion. Other: None. CT MAXILLOFACIAL FINDINGS Osseous: Lower frontal bones are intact and normally aligned. No displaced nasal bone fracture. Osseous structures about the orbits are intact and normally aligned bilaterally. Walls of the maxillary sinuses are intact and normally aligned bilaterally. Bilateral zygoma and pterygoid plates are intact. No mandible fracture or displacement. Orbits: Negative. No traumatic or inflammatory finding. Sinuses: Clear. Soft tissues: No soft tissue hematoma seen. CT CERVICAL SPINE FINDINGS Alignment: Mild reversal of the normal cervical  spine lordosis. No evidence of acute vertebral body subluxation. Skull base and vertebrae: No fracture line or displaced fracture fragment. Facet joints appear intact and normally aligned throughout. Soft tissues and spinal canal: No prevertebral fluid or swelling. No visible canal hematoma. Disc levels: Mild degenerative spurring at multiple levels, but no significant central canal stenosis. Upper chest: Unremarkable. Other: Probable emphysematous change at each lung apex, incompletely imaged. Carotid atherosclerosis. IMPRESSION: 1. No acute intracranial abnormality. No intracranial mass, hemorrhage or edema. No skull fracture. 2. No facial bone fracture or displacement. 3. No fracture or acute subluxation within the cervical spine. Mild reversal of the normal cervical spine lordosis is likely related to patient positioning and/or muscle spasm. 4. Carotid atherosclerosis. Electronically Signed   By: Bary Richard M.D.   On: 10/23/2016 23:39   Dg Shoulder Left  Result Date: 10/23/2016 CLINICAL DATA:  Left shoulder pain after a fall today. EXAM: LEFT SHOULDER - 2+ VIEW COMPARISON:  None. FINDINGS: There is anterior inferior dislocation of the left humeral head with respect to the glenoid. There is impaction of the superolateral humeral head onto the glenoid with a displaced fracture involving the greater tuberosity with extension to the metaphysis. Slight cortical irregularity along the medial humeral neck could indicate extension of the fracture through the humeral neck. IMPRESSION: Anterior dislocation and fracture of the left shoulder. Electronically Signed   By: Burman Nieves M.D.   On: 10/23/2016 23:20   Dg Shoulder Left Portable  Result Date: 10/24/2016 CLINICAL DATA:  Postreduction left shoulder EXAM: LEFT SHOULDER - 1 VIEW COMPARISON:  10/23/2016 FINDINGS: Anatomic position of the left shoulder postreduction. Fracture lines remain visible but due to rotation are not well demonstrated. Near-anatomic  position of the visualized fractures. IMPRESSION: Anatomic position of the left shoulder postreduction with near-anatomic position of visualized proximal humeral fractures. Electronically Signed   By: Burman Nieves M.D.   On: 10/24/2016 02:59   Ct Maxillofacial Wo Contrast  Result Date: 10/23/2016 CLINICAL DATA:  Status post fall tonight. EXAM: CT HEAD WITHOUT CONTRAST CT MAXILLOFACIAL WITHOUT CONTRAST CT CERVICAL SPINE WITHOUT CONTRAST TECHNIQUE: Multidetector CT imaging of the head, cervical spine, and maxillofacial structures were performed using the standard protocol without intravenous contrast. Multiplanar CT image reconstructions of the cervical spine and maxillofacial structures were also generated. COMPARISON:  None. FINDINGS: CT HEAD FINDINGS Brain: Mild generalized parenchymal atrophy with commensurate dilatation of the ventricles and sulci. There is no mass, hemorrhage, edema or other evidence of acute parenchymal abnormality. No extra-axial hemorrhage. Vascular: There are chronic calcified atherosclerotic changes of the large vessels at the skull base. No unexpected hyperdense vessel. Skull: Normal.  Negative for fracture or focal lesion. Other: None. CT MAXILLOFACIAL FINDINGS Osseous: Lower frontal bones are intact and normally aligned. No displaced nasal bone fracture. Osseous structures about the orbits are intact and normally aligned bilaterally. Walls of the maxillary sinuses are intact and normally aligned bilaterally. Bilateral zygoma and pterygoid plates are intact. No mandible fracture or displacement. Orbits: Negative. No traumatic or inflammatory finding. Sinuses: Clear. Soft tissues: No soft tissue hematoma seen. CT CERVICAL SPINE FINDINGS Alignment: Mild reversal of the normal cervical spine lordosis. No evidence of acute vertebral body subluxation. Skull base and vertebrae: No fracture line or displaced fracture fragment. Facet joints appear intact and normally aligned throughout.  Soft tissues and spinal canal: No prevertebral fluid or swelling. No visible canal hematoma. Disc levels: Mild degenerative spurring at multiple levels, but no significant central canal stenosis. Upper chest: Unremarkable. Other: Probable emphysematous change at each lung apex, incompletely imaged. Carotid atherosclerosis. IMPRESSION: 1. No acute intracranial abnormality. No intracranial mass, hemorrhage or edema. No skull fracture. 2. No facial bone fracture or displacement. 3. No fracture or acute subluxation within the cervical spine. Mild reversal of the normal cervical spine lordosis is likely related to patient positioning and/or muscle spasm. 4. Carotid atherosclerosis. Electronically Signed   By: Bary Richard M.D.   On: 10/23/2016 23:39    Procedures Procedures (including critical care time)  Medications Ordered in ED Medications  morphine 2 MG/ML injection 4 mg (4 mg Intravenous Given 10/23/16 2248)  etomidate (AMIDATE) injection 20 mg (20 mg Intravenous Given 10/24/16 0210)  morphine 2 MG/ML injection 4 mg (4 mg Intravenous Given 10/24/16 0134)  ketamine 100 mg in normal saline 10 mL (10mg /mL) syringe (100 mg Intravenous Given 10/24/16 0225)     Initial Impression / Assessment and Plan / ED Course  I have reviewed the triage vital signs and the nursing notes.  Pertinent labs & imaging results that were available during my care of the patient were reviewed by me and considered in my medical decision making (see chart for details).     1214 Patient has anterior dislocation and fracture of the left shoulder. I spoke with orthopedic surgeon, Dr. Lajoyce Corners, who gave recommendation to attempt reduction in the ED. He advised sling and follow-up in the office this week.  Successful shoulder reduction performed by Dr. Derwood Kaplan. See his note for procedure.  Post reduction film shows anatomic position of the left shoulder with near anatomic position of the visualized proximal humeral  fractures. Patient neurovascularly intact following reduction. Patient placed in sling. Follow have patient follow up with orthopedics this week. Percocet provided for pain control. I reviewed the [place] narcotic database and found no discrepancies. Return precautions discussed. Patient understands and agrees with plan. Patient vitals stable throughout ED course and discharged in satisfactory condition. Patient also evaluated by Dr. Rhunette Croft who got at the patient's management and agrees with plan.  Final Clinical Impressions(s) / ED Diagnoses   Final diagnoses:  Dislocation of left shoulder joint, initial encounter  Closed displaced fracture of greater tuberosity of left humerus, initial encounter    New Prescriptions Discharge Medication List as of 10/24/2016  4:16 AM    START taking these medications   Details  oxyCODONE-acetaminophen (PERCOCET/ROXICET) 5-325 MG tablet Take 1-2 tablets by mouth every 6 (six) hours as needed for severe pain., Starting Sun 10/24/2016, Print         [place], [facility], PA-C 10/24/16 0840    Derwood Kaplan, MD 10/26/16 2229

## 2016-10-23 NOTE — ED Notes (Signed)
Bed: WA21 Expected date:  Expected time:  Means of arrival:  Comments: [id number] fall shoulder pain

## 2016-10-23 NOTE — ED Notes (Signed)
PT STS SHE NEEDS PAIN MEDICATION BEFORE SHE GOES FOR THE XRAY.

## 2016-10-23 NOTE — ED Triage Notes (Signed)
PT RECEIVED VIA EMS FOR LEFT SHOULDER PAIN DUE TO A TRIP AND FALL. PT ALSO C/O RIGHT KNEE PAIN. DENIES LOC.

## 2016-10-24 ENCOUNTER — Emergency Department (HOSPITAL_COMMUNITY): Payer: Medicare Other

## 2016-10-24 DIAGNOSIS — S42252A Displaced fracture of greater tuberosity of left humerus, initial encounter for closed fracture: Secondary | ICD-10-CM | POA: Diagnosis not present

## 2016-10-24 MED ORDER — MORPHINE SULFATE (PF) 2 MG/ML IV SOLN
4.0000 mg | Freq: Once | INTRAVENOUS | Status: AC
  Administered 2016-10-24: 4 mg via INTRAVENOUS
  Filled 2016-10-24: qty 2

## 2016-10-24 MED ORDER — KETAMINE HCL-SODIUM CHLORIDE 100-0.9 MG/10ML-% IV SOSY
1.0000 mg/kg | PREFILLED_SYRINGE | Freq: Once | INTRAVENOUS | Status: AC
  Administered 2016-10-24: 100 mg via INTRAVENOUS
  Filled 2016-10-24: qty 20

## 2016-10-24 MED ORDER — OXYCODONE-ACETAMINOPHEN 5-325 MG PO TABS: 1.0000 | ORAL_TABLET | Freq: Four times a day (QID) | ORAL | 0 refills | Status: DC | PRN

## 2016-10-24 MED ORDER — ETOMIDATE 2 MG/ML IV SOLN
20.0000 mg | Freq: Once | INTRAVENOUS | Status: AC
  Administered 2016-10-24: 20 mg via INTRAVENOUS
  Filled 2016-10-24: qty 10

## 2016-10-24 NOTE — Discharge Instructions (Addendum)
Medications: Percocet  Treatment: Take 1-2 Percocet every 4-6 hours as needed for severe pain. Only take this medication as prescribed. Do not drive or operate machinery when taking this medication. Wear your sling at all times. You can cover your arm with a trash bag to keep it from getting wet.  Follow-up: Please follow-up with Dr. Lajoyce Cornersuda this week for further evaluation and treatment. Please return to emergency department if you develop any new or worsening symptoms.

## 2016-10-24 NOTE — Progress Notes (Signed)
RT on standby in room approximately 20 minutes during conscious sedation procedure.

## 2016-10-28 ENCOUNTER — Encounter (INDEPENDENT_AMBULATORY_CARE_PROVIDER_SITE_OTHER): Payer: Self-pay | Admitting: Family

## 2016-10-28 ENCOUNTER — Ambulatory Visit (INDEPENDENT_AMBULATORY_CARE_PROVIDER_SITE_OTHER): Payer: Medicare Other

## 2016-10-28 ENCOUNTER — Ambulatory Visit (INDEPENDENT_AMBULATORY_CARE_PROVIDER_SITE_OTHER): Payer: Medicare Other | Admitting: Family

## 2016-10-28 VITALS — Ht 65.0 in | Wt 282.0 lb

## 2016-10-28 DIAGNOSIS — M25532 Pain in left wrist: Secondary | ICD-10-CM

## 2016-10-28 MED ORDER — IBUPROFEN 800 MG PO TABS: 800.0000 mg | ORAL_TABLET | Freq: Three times a day (TID) | ORAL | 0 refills | Status: AC | PRN

## 2016-10-28 MED ORDER — TRAMADOL HCL 50 MG PO TABS
50.0000 mg | ORAL_TABLET | Freq: Four times a day (QID) | ORAL | 0 refills | Status: DC | PRN
Start: 2016-10-28 — End: 2017-01-10

## 2016-10-28 NOTE — Progress Notes (Signed)
Office Visit Note   Patient: Lindsay ColaceJudy Brady           Date of Birth: 1950/08/13           MRN: [id number] Visit Date: 10/28/2016              Requested by: Tegeler, Smith Minceebra Renee, MD No address on file PCP: Tegeler, Smith Minceebra Renee, MD  Chief Complaint  Patient presents with  . Left Shoulder - Follow-up    DOI 10/23/16 s/p fall post reduction left shoulder dislocation and humeral fracture.       HPI: The patient is a 66 year old woman who is seen today for evaluation of left shoulder pain following a dislocation as well as a fracture of her proximal humerus. Was seen in the emergency department on May 26 following the injury. Dislocation was reduced. She's been in a sling since. Has been managing her pain with Percocet. Requesting refill on her pain medication however does request something less strong.  Complaining of wrist pain over the distal radius.  Assessment & Plan: Visit Diagnoses:  1. Pain in left wrist     Plan: reassurance provided re left wrist pain. Continue sling. Do work on rom of left elbow. Pain medicine rx provided. May use ice. Follow up in office in 2 weeks with radiographs of left shoulder.   Follow-Up Instructions: Return in about 2 weeks (around 11/11/2016).   Right Hand Exam   Muscle Strength  Grip: 5/5    Left Hand Exam   Tenderness  The patient is experiencing tenderness in the radial area.   Range of Motion  The patient has normal left wrist ROM.  Muscle Strength  Grip:  5/5   Other  Pulse: present   Left Shoulder Exam   Comments:  Proximal humerus tenderness.       Patient is alert, oriented, no adenopathy, well-dressed, normal affect, normal respiratory effort.   Imaging: Xr Wrist Complete Left  Result Date: 10/28/2016 Radiographs of left wrist are negative for fracture.   Labs: Lab Results  Component Value Date   ESRSEDRATE 34 (H) 09/08/2016   ESRSEDRATE 31 (H) 07/27/2016   ESRSEDRATE 26 06/15/2016   CRP 2.4 01/07/2016     Orders:  Orders Placed This Encounter  Procedures  . XR Wrist Complete Left   Meds ordered this encounter  Medications  . traMADol (ULTRAM) 50 MG tablet    Sig: Take 1 tablet (50 mg total) by mouth every 6 (six) hours as needed.    Dispense:  30 tablet    Refill:  0  . ibuprofen (ADVIL,MOTRIN) 800 MG tablet    Sig: Take 1 tablet (800 mg total) by mouth every 8 (eight) hours as needed.    Dispense:  90 tablet    Refill:  0     Procedures: No procedures performed  Clinical Data: No additional findings.  ROS:  All other systems negative, except as noted in the HPI. Review of Systems  Constitutional: Negative for chills and fever.  Musculoskeletal: Positive for arthralgias.  Neurological: Negative for weakness and numbness.    Objective: Vital Signs: Ht 5\' 5"  (1.651 m)   Wt 282 lb (127.9 kg)   BMI 46.93 kg/m   Specialty Comments:  No specialty comments available.  PMFS History: Patient Active Problem List   Diagnosis Date Noted  . Dyspnea on exertion 07/27/2016  . RBBB 03/04/2016  . OSA on CPAP 03/04/2016  . ILD (interstitial lung disease) (HCC)  c/w NSIP p macrodantin  exp 02/11/2016  . Morbid (severe) obesity due to excess calories (HCC) 02/11/2016  . Chronic respiratory failure with hypoxia (HCC) 01/07/2016  . Upper airway cough syndrome 12/06/2015  . COPD GOLD 0 12/05/2015  . Acute cholecystitis 07/19/2014   Past Medical History:  Diagnosis Date  . Anxiety   . Arthritis   . Cholecystitis 07/2014  . COPD (chronic obstructive pulmonary disease) (HCC)   . Diabetes mellitus without complication (HCC)    type 2   . Fracture 2013 ?   left leg  . GERD (gastroesophageal reflux disease)    hx of gerd  . Headache   . Hx: UTI (urinary tract infection)   . OA (osteoarthritis) of knee   . Pneumonia    hx of viral pna years ago  . Shortness of breath dyspnea   . Sleep apnea    uses cpap  . Urgency of urination     Family History  Problem Relation Age  of Onset  . Stroke Mother   . Lung cancer Father        smoked  . Colon cancer Father   . Cancer Paternal Grandmother   . Cancer Paternal Grandfather     Past Surgical History:  Procedure Laterality Date  . ABDOMINAL HYSTERECTOMY    . CARPAL TUNNEL RELEASE    . CHOLECYSTECTOMY N/A 07/19/2014   Procedure: LAPAROSCOPIC CHOLECYSTECTOMY WITH INTRAOPERATIVE CHOLANGIOGRAM;  Surgeon: Violeta Gelinas, MD;  Location: [facility] OR;  Service: General;  Laterality: N/A;  . COLONOSCOPY  ?   2011  . KNEE ARTHROSCOPY    . TONSILLECTOMY     Social History   Occupational History  . Not on file.   Social History Main Topics  . Smoking status: Former Smoker    Packs/day: 2.00    Years: 30.00    Types: Cigarettes    Quit date: 05/31/1996  . Smokeless tobacco: Never Used  . Alcohol use No     Comment: rare  . Drug use: No  . Sexual activity: Not on file

## 2016-11-02 ENCOUNTER — Ambulatory Visit: Payer: Self-pay | Admitting: Obstetrics & Gynecology

## 2016-11-08 ENCOUNTER — Telehealth (INDEPENDENT_AMBULATORY_CARE_PROVIDER_SITE_OTHER): Payer: Self-pay | Admitting: Orthopedic Surgery

## 2016-11-08 NOTE — Telephone Encounter (Signed)
DOI 10/23/16 s/p fall post reduction left shoulder dislocation and humeral fracture.  Last refill of percocet was 10/24/16 #15 and tramadol 50 mg #30 on 10/28/16 pt is requesting a refill of percocet please advise.

## 2016-11-08 NOTE — Telephone Encounter (Signed)
Patient called needing Rx refilled (Percocet)  Patient asked if she can ger another sling for her left arm. The number to contact patient is [phone]

## 2016-11-10 ENCOUNTER — Other Ambulatory Visit (INDEPENDENT_AMBULATORY_CARE_PROVIDER_SITE_OTHER): Payer: Self-pay | Admitting: Family

## 2016-11-10 MED ORDER — OXYCODONE-ACETAMINOPHEN 5-325 MG PO TABS: 1.0000 | ORAL_TABLET | Freq: Three times a day (TID) | ORAL | 0 refills | Status: DC | PRN

## 2016-11-10 NOTE — Telephone Encounter (Signed)
Called and lm on vm to advise the rx is at the front desk for pick up.

## 2016-11-11 ENCOUNTER — Ambulatory Visit (INDEPENDENT_AMBULATORY_CARE_PROVIDER_SITE_OTHER): Payer: Medicare Other | Admitting: Orthopedic Surgery

## 2016-11-11 ENCOUNTER — Ambulatory Visit (INDEPENDENT_AMBULATORY_CARE_PROVIDER_SITE_OTHER): Payer: Medicare Other

## 2016-11-11 ENCOUNTER — Ambulatory Visit (INDEPENDENT_AMBULATORY_CARE_PROVIDER_SITE_OTHER): Payer: Self-pay

## 2016-11-11 DIAGNOSIS — S43015D Anterior dislocation of left humerus, subsequent encounter: Secondary | ICD-10-CM | POA: Diagnosis not present

## 2016-11-12 NOTE — Progress Notes (Signed)
Office Visit Note   Patient: Lindsay Brady           Date of Birth: Dec 27, 1950           MRN: [id number] Visit Date: 11/11/2016              Requested by: Tegeler, Smith Mince, MD No address on file PCP: Tegeler, Smith Mince, MD  Chief Complaint  Patient presents with  . Left Shoulder - Follow-up      HPI: Patient presents in follow-up for dislocation of the left shoulder with a greater tuberosity fracture. Patient is currently in a sling she describes pain with attempted motion.  Assessment & Plan: Visit Diagnoses:  1. Anterior shoulder dislocation, left, subsequent encounter     Plan: Recommend that she continue the sling she was given instructions for pendulum range of motion exercises as well as gentle abduction and flexion passively. Follow-up in 2 weeks with repeat 2 view radiographs of the left shoulder anticipate starting physical therapy at that time.  Follow-Up Instructions: Return in about 2 weeks (around 11/25/2016).   Ortho Exam  Patient is alert, oriented, no adenopathy, well-dressed, normal affect, normal respiratory effort. Examination of the patient's left upper extremity is neurovascularly intact. She has pain with attempted range of motion of the left shoulder. Radiographs shows reduced fracture.  Imaging: Xr Shoulder Left  Result Date: 11/12/2016 Two-view radiographs of the left shoulder shows a congruent glenohumeral joint. The greater tuberosity fracture is reduced.   Labs: Lab Results  Component Value Date   ESRSEDRATE 34 (H) 09/08/2016   ESRSEDRATE 31 (H) 07/27/2016   ESRSEDRATE 26 06/15/2016   CRP 2.4 01/07/2016    Orders:  Orders Placed This Encounter  Procedures  . XR Shoulder Left  . XR Shoulder Left   No orders of the defined types were placed in this encounter.    Procedures: No procedures performed  Clinical Data: No additional findings.  ROS:  All other systems negative, except as noted in the HPI. Review of  Systems  Objective: Vital Signs: There were no vitals taken for this visit.  Specialty Comments:  No specialty comments available.  PMFS History: Patient Active Problem List   Diagnosis Date Noted  . Dyspnea on exertion 07/27/2016  . RBBB 03/04/2016  . OSA on CPAP 03/04/2016  . ILD (interstitial lung disease) (HCC)  c/w NSIP p macrodantin exp 02/11/2016  . Morbid (severe) obesity due to excess calories (HCC) 02/11/2016  . Chronic respiratory failure with hypoxia (HCC) 01/07/2016  . Upper airway cough syndrome 12/06/2015  . COPD GOLD 0 12/05/2015  . Acute cholecystitis 07/19/2014   Past Medical History:  Diagnosis Date  . Anxiety   . Arthritis   . Cholecystitis 07/2014  . COPD (chronic obstructive pulmonary disease) (HCC)   . Diabetes mellitus without complication (HCC)    type 2   . Fracture 2013 ?   left leg  . GERD (gastroesophageal reflux disease)    hx of gerd  . Headache   . Hx: UTI (urinary tract infection)   . OA (osteoarthritis) of knee   . Pneumonia    hx of viral pna years ago  . Shortness of breath dyspnea   . Sleep apnea    uses cpap  . Urgency of urination     Family History  Problem Relation Age of Onset  . Stroke Mother   . Lung cancer Father        smoked  . Colon cancer Father   .  Cancer Paternal Grandmother   . Cancer Paternal Grandfather     Past Surgical History:  Procedure Laterality Date  . ABDOMINAL HYSTERECTOMY    . CARPAL TUNNEL RELEASE    . CHOLECYSTECTOMY N/A 07/19/2014   Procedure: LAPAROSCOPIC CHOLECYSTECTOMY WITH INTRAOPERATIVE CHOLANGIOGRAM;  Surgeon: Violeta GelinasBurke Thompson, MD;  Location: [facility] OR;  Service: General;  Laterality: N/A;  . COLONOSCOPY  ?   2011  . KNEE ARTHROSCOPY    . TONSILLECTOMY     Social History   Occupational History  . Not on file.   Social History Main Topics  . Smoking status: Former Smoker    Packs/day: 2.00    Years: 30.00    Types: Cigarettes    Quit date: 05/31/1996  . Smokeless tobacco: Never Used   . Alcohol use No     Comment: rare  . Drug use: No  . Sexual activity: Not on file

## 2016-11-25 ENCOUNTER — Ambulatory Visit (INDEPENDENT_AMBULATORY_CARE_PROVIDER_SITE_OTHER): Payer: Medicare Other

## 2016-11-25 ENCOUNTER — Ambulatory Visit (INDEPENDENT_AMBULATORY_CARE_PROVIDER_SITE_OTHER): Payer: Medicare Other | Admitting: Orthopedic Surgery

## 2016-11-25 ENCOUNTER — Encounter (INDEPENDENT_AMBULATORY_CARE_PROVIDER_SITE_OTHER): Payer: Self-pay | Admitting: Orthopedic Surgery

## 2016-11-25 VITALS — Ht 65.0 in | Wt 282.0 lb

## 2016-11-25 DIAGNOSIS — S43015D Anterior dislocation of left humerus, subsequent encounter: Secondary | ICD-10-CM

## 2016-11-25 DIAGNOSIS — G8929 Other chronic pain: Secondary | ICD-10-CM | POA: Diagnosis not present

## 2016-11-25 DIAGNOSIS — M25512 Pain in left shoulder: Secondary | ICD-10-CM

## 2016-11-25 MED ORDER — OXYCODONE-ACETAMINOPHEN 5-325 MG PO TABS: 1.0000 | ORAL_TABLET | Freq: Three times a day (TID) | ORAL | 0 refills | Status: DC | PRN

## 2016-11-25 NOTE — Progress Notes (Signed)
Office Visit Note   Patient: Lindsay Brady           Date of Birth: 1951/01/17           MRN: [id number] Visit Date: 11/25/2016              Requested by: Tegeler, Smith Minceebra Renee, MD No address on file PCP: Tegeler, Smith Minceebra Renee, MD  Chief Complaint  Patient presents with  . Left Shoulder - Follow-up    Left anterior shoulder dislocation DOI 10/23/16      HPI: Patient presents 4 weeks status post left shoulder dislocation with reduction as well as a greater tuberosity fracture which is also reduced. Patient has been in a sling she has not been doing much activities or range of motion.  Assessment & Plan: Visit Diagnoses:  1. Chronic left shoulder pain   2. Anterior shoulder dislocation, left, subsequent encounter     Plan: Patient is given a prescription for con physical [facility]. Prescription for range of motion strengthening of the left shoulder to decrease the subluxation of the glenohumeral joint. Patient was recommended to discontinue the sling during the day. She is given a refill prescription for Percocet for pain.  Follow-Up Instructions: Return in about 3 weeks (around 12/16/2016).   Ortho Exam  Patient is alert, oriented, no adenopathy, well-dressed, normal affect, normal respiratory effort. Examination patient is developing decreased range of motion left shoulder. Her left upper extremity is neurovascularly intact.  Imaging: Xr Shoulder Left  Result Date: 11/25/2016 2 view radiographs of the left shoulder shows inferior subluxation of the humeral head within the glenoid. The greater tuberosity fracture is reduced with no elevation. Axillary view shows a congruent joint.   Labs: Lab Results  Component Value Date   ESRSEDRATE 34 (H) 09/08/2016   ESRSEDRATE 31 (H) 07/27/2016   ESRSEDRATE 26 06/15/2016   CRP 2.4 01/07/2016    Orders:  Orders Placed This Encounter  Procedures  . XR Shoulder Left   Meds ordered this encounter  Medications  .  oxyCODONE-acetaminophen (PERCOCET/ROXICET) 5-325 MG tablet    Sig: Take 1 tablet by mouth every 8 (eight) hours as needed for severe pain.    Dispense:  30 tablet    Refill:  0     Procedures: No procedures performed  Clinical Data: No additional findings.  ROS:  All other systems negative, except as noted in the HPI. Review of Systems  Objective: Vital Signs: Ht 5\' 5"  (1.651 m)   Wt 282 lb (127.9 kg)   BMI 46.93 kg/m   Specialty Comments:  No specialty comments available.  PMFS History: Patient Active Problem List   Diagnosis Date Noted  . Dyspnea on exertion 07/27/2016  . RBBB 03/04/2016  . OSA on CPAP 03/04/2016  . ILD (interstitial lung disease) (HCC)  c/w NSIP p macrodantin exp 02/11/2016  . Morbid (severe) obesity due to excess calories (HCC) 02/11/2016  . Chronic respiratory failure with hypoxia (HCC) 01/07/2016  . Upper airway cough syndrome 12/06/2015  . COPD GOLD 0 12/05/2015  . Acute cholecystitis 07/19/2014   Past Medical History:  Diagnosis Date  . Anxiety   . Arthritis   . Cholecystitis 07/2014  . COPD (chronic obstructive pulmonary disease) (HCC)   . Diabetes mellitus without complication (HCC)    type 2   . Fracture 2013 ?   left leg  . GERD (gastroesophageal reflux disease)    hx of gerd  . Headache   . Hx: UTI (urinary tract  infection)   . OA (osteoarthritis) of knee   . Pneumonia    hx of viral pna years ago  . Shortness of breath dyspnea   . Sleep apnea    uses cpap  . Urgency of urination     Family History  Problem Relation Age of Onset  . Stroke Mother   . Lung cancer Father        smoked  . Colon cancer Father   . Cancer Paternal Grandmother   . Cancer Paternal Grandfather     Past Surgical History:  Procedure Laterality Date  . ABDOMINAL HYSTERECTOMY    . CARPAL TUNNEL RELEASE    . CHOLECYSTECTOMY N/A 07/19/2014   Procedure: LAPAROSCOPIC CHOLECYSTECTOMY WITH INTRAOPERATIVE CHOLANGIOGRAM;  Surgeon: Violeta Gelinas, MD;   Location: [facility] OR;  Service: General;  Laterality: N/A;  . COLONOSCOPY  ?   2011  . KNEE ARTHROSCOPY    . TONSILLECTOMY     Social History   Occupational History  . Not on file.   Social History Main Topics  . Smoking status: Former Smoker    Packs/day: 2.00    Years: 30.00    Types: Cigarettes    Quit date: 05/31/1996  . Smokeless tobacco: Never Used  . Alcohol use No     Comment: rare  . Drug use: No  . Sexual activity: Not on file

## 2016-12-02 ENCOUNTER — Ambulatory Visit: Payer: Medicare Other | Admitting: Physical Therapy

## 2016-12-02 ENCOUNTER — Telehealth (INDEPENDENT_AMBULATORY_CARE_PROVIDER_SITE_OTHER): Payer: Self-pay | Admitting: Radiology

## 2016-12-02 DIAGNOSIS — S43015A Anterior dislocation of left humerus, initial encounter: Secondary | ICD-10-CM

## 2016-12-02 NOTE — Telephone Encounter (Signed)
Patient is going to be seen at [facility] today. Order placed for range of motion strengthening of the left shoulder to decrease the subluxation of the glenohumeral joint.

## 2016-12-06 ENCOUNTER — Ambulatory Visit: Payer: Medicare Other | Admitting: Rehabilitative and Restorative Service Providers"

## 2016-12-06 ENCOUNTER — Encounter: Payer: Medicare Other | Admitting: Rehabilitative and Restorative Service Providers"

## 2016-12-07 ENCOUNTER — Other Ambulatory Visit (INDEPENDENT_AMBULATORY_CARE_PROVIDER_SITE_OTHER): Payer: Self-pay | Admitting: Orthopedic Surgery

## 2016-12-07 ENCOUNTER — Ambulatory Visit: Payer: Medicare Other | Attending: Orthopedic Surgery | Admitting: Physical Therapy

## 2016-12-07 ENCOUNTER — Telehealth (INDEPENDENT_AMBULATORY_CARE_PROVIDER_SITE_OTHER): Payer: Self-pay | Admitting: Orthopedic Surgery

## 2016-12-07 DIAGNOSIS — M6281 Muscle weakness (generalized): Secondary | ICD-10-CM

## 2016-12-07 DIAGNOSIS — R293 Abnormal posture: Secondary | ICD-10-CM | POA: Insufficient documentation

## 2016-12-07 DIAGNOSIS — M25512 Pain in left shoulder: Secondary | ICD-10-CM | POA: Insufficient documentation

## 2016-12-07 MED ORDER — OXYCODONE-ACETAMINOPHEN 5-325 MG PO TABS: 1.0000 | ORAL_TABLET | Freq: Two times a day (BID) | ORAL | 0 refills | Status: DC | PRN

## 2016-12-07 NOTE — Patient Instructions (Signed)
       30x each using weight that you can tolerate   You can also walk the fingers up the back - 10 sec hold x 10 reps    10 sec hold x 10 reps     10 sec hold x 10 rep

## 2016-12-07 NOTE — Telephone Encounter (Signed)
Patient called needing Rx refilled (percocet0 The number to contact patient is [phone]

## 2016-12-07 NOTE — Telephone Encounter (Signed)
Call patient, too soon for refill. Patient received a prescription 12 days ago.

## 2016-12-07 NOTE — Telephone Encounter (Signed)
rx written

## 2016-12-08 ENCOUNTER — Encounter: Payer: Self-pay | Admitting: Physical Therapy

## 2016-12-08 NOTE — Telephone Encounter (Signed)
I called and left a voicemail for the patient advising that Dr. Duda did write another prescription for percocet. However she needs to be weaning herself off of this. This medication is addictive in nature and she needs to be trying to get off this medication. Advised rx is at the front desk for pick up.  

## 2016-12-08 NOTE — Therapy (Signed)
[facility] [address], [address] [place], [place], [address] Phone: [phone]   Fax:  [phone]  Physical Therapy Evaluation  Patient Details  Name: Lindsay Brady MRN: [id number] Date of Birth: 1951-04-20 Referring Provider: Aldean BakerUDA, MARCUS   Encounter Date: 12/07/2016      PT End of Session - 12/07/16 1353    Visit Number 1   Date for PT Re-Evaluation 02/01/17   PT Start Time 1232   PT Stop Time 1316   PT Time Calculation (min) 44 min   Activity Tolerance Patient limited by pain   Behavior During Therapy [facility] for tasks assessed/performed      Past Medical History:  Diagnosis Date  . Anxiety   . Arthritis   . Cholecystitis 07/2014  . COPD (chronic obstructive pulmonary disease) (HCC)   . Diabetes mellitus without complication (HCC)    type 2   . Fracture 2013 ?   left leg  . GERD (gastroesophageal reflux disease)    hx of gerd  . Headache   . Hx: UTI (urinary tract infection)   . OA (osteoarthritis) of knee   . Pneumonia    hx of viral pna years ago  . Shortness of breath dyspnea   . Sleep apnea    uses cpap  . Urgency of urination     Past Surgical History:  Procedure Laterality Date  . ABDOMINAL HYSTERECTOMY    . CARPAL TUNNEL RELEASE    . CHOLECYSTECTOMY N/A 07/19/2014   Procedure: LAPAROSCOPIC CHOLECYSTECTOMY WITH INTRAOPERATIVE CHOLANGIOGRAM;  Surgeon: Violeta GelinasBurke Thompson, MD;  [facility];  Service: General;  Laterality: N/A;  . COLONOSCOPY  ?   2011  . KNEE ARTHROSCOPY    . TONSILLECTOMY      There were no vitals filed for this visit.       Subjective Assessment - 12/07/16 1241    Subjective I can't do much with my arm. Patient had fall end of May when tripping on a carpet at the vet's office.  Pt fractured head of humerus and dislocated left shoulder.  She is healing well but shoulder is sublux due to weakness.  She has weaned off sling, but still unable to perform AROM due to pain and  weakness.   Patient Stated Goals have it back before it started   Currently in Pain? Yes   Pain Score 1   8/10 worst   Pain Location Shoulder   Pain Orientation Left   Pain Descriptors / Indicators Aching;Dull   Pain Type Acute pain   Pain Radiating Towards mid arm   Pain Onset More than a month ago   Pain Frequency Intermittent   Aggravating Factors  when doing a lot   Pain Relieving Factors perkaset as needed   Effect of Pain on Daily Activities overhead activities, sleeping losing 5 hours            [facility] - 12/08/16 0001      Assessment   Medical Diagnosis S43.015A (ICD-10-CM) - Closed anterior dislocation of left shoulder, initial encounter   Referring Provider DUDA, MARCUS    Onset Date/Surgical Date 10/27/16   Next MD Visit 2 weeks   Prior Therapy No     Precautions   Precautions Shoulder   Type of Shoulder Precautions guided by pain; humeral head fracture and history of dislocation     Restrictions   Weight Bearing Restrictions No     Balance Screen   Has the patient fallen in  the past 6 months Yes   How many times? 1   Has the patient had a decrease in activity level because of a fear of falling?  No   Is the patient reluctant to leave their home because of a fear of falling?  No     Home Environment   Living Environment Private residence   Living Arrangements Children     Prior Function   Level of Independence Needs assistance with ADLs;Needs assistance with homemaking   Vocation Retired     IT consultant   Overall Cognitive Status Within Functional Limits for tasks assessed     Observation/Other Assessments   Focus on Therapeutic Outcomes (FOTO)  71% limited     Posture/Postural Control   Posture/Postural Control Postural limitations   Postural Limitations Rounded Shoulders     AROM   Left Shoulder Flexion 45 Degrees   Left Shoulder ABduction 80 Degrees   Left Shoulder Internal Rotation 45 Degrees   Left Shoulder External Rotation  20 Degrees     Strength   Right/Left Shoulder Right  2/5 MMT     Palpation   Palpation comment right shoulder: tight upper traps, tender humeral head, sulcus sign     Ambulation/Gait   Gait Pattern Within Functional Limits            Objective measurements completed on examination: See above findings.          [facility] Adult PT Treatment/Exercise - 12/08/16 0001      Exercises   Exercises Shoulder;Other Exercises   Other Exercises  wrist flex, ext, sup/pronation; PROM shoulder flexion, ER, IR                PT Education - 12/07/16 1351    Education provided Yes   Education Details forearm strengthening, PROM table slides flexion, ER and IR   Person(s) Educated Patient   Methods Explanation;Demonstration;Verbal cues;Handout   Comprehension Verbalized understanding;Returned demonstration          PT Short Term Goals - 12/07/16 1248      PT SHORT TERM GOAL #1   Title pt will feel more steady when getting out of bed due to increased strength and body awareness   Time 4   Period Weeks   Status New     PT SHORT TERM GOAL #2   Title independent with initial HEP   Time 4   Period Weeks   Status New     PT SHORT TERM GOAL #3   Title improved PROM to 120 deg flexion and 90 deg external rotation of left shoulder   Time 4   Period Weeks   Status New     PT SHORT TERM GOAL #4   Title left shoulder strength improved to be able to peform AROM flexion and abduction to 90 degrees for improved functional lifting activities   Time 4   Period Weeks   Status New     PT SHORT TERM GOAL #5   Title pain reduced by 25%   Baseline 8/10 pain at worst   Time 4   Period Weeks   Status New           PT Long Term Goals - 12/07/16 1247      PT LONG TERM GOAL #1   Title patient able to get dressed by herself due to improved strength and ROM   Baseline daughter helps   Time 12   Period Weeks   Status New  PT LONG TERM GOAL #2   Title Pt able to  perform overhead activities such as putting things in her cabinets due to improved strength and AROM   Time 12   Period Weeks   Status New     PT LONG TERM GOAL #3   Title independent with advanced HEP   Time 12   Period Weeks   Status New     PT LONG TERM GOAL #4   Title FOTO < or = to 44% limited   Time 12   Period Weeks   Status New     PT LONG TERM GOAL #5   Title pain reduced by 60% or more   Baseline 8/10 pain at worst   Time 12   Period Weeks   Status New                Plan - Jan 01, 2017 1610    Clinical Impression Statement Patient presents to clinic after a fall that occurred at the end of May of this year.  She has fractured the greater tubercle of humeral head on left and dislocated left shoulder during the fall.  Patient has recently weaned out of sling and has significant shouler weakness. Recent imaging shows fractures are healing.  Pt was referred by her doctor for ROM and strengthening.  Patient has decreased active and passive ROM in all directions.  Rounded shoulders and overactive upper traps with any movment.  Patient has left shoulder weakness of 2/5 MMT.  She has pain with all motions especially flexion and abduction.  Patient will benefit from skilled PT to work on impairments and assist patient safely back to strength and ROM for return to independent with ADLs and functional activities.   History and Personal Factors relevant to plan of care: n/a   Clinical Presentation Stable   Clinical Presentation due to: improved since fall   Clinical Decision Making Low   Rehab Potential Excellent   PT Frequency 1x / week   PT Duration 12 weeks   PT Treatment/Interventions ADLs/Self Care Home Management;Biofeedback;Cryotherapy;Electrical Stimulation;Iontophoresis 4mg /ml Dexamethasone;Moist Heat;Ultrasound;Functional mobility training;Therapeutic activities;Therapeutic exercise;Neuromuscular re-education;Passive range of motion;Patient/family education;Dry  needling;Taping;Manual techniques   PT Next Visit Plan isometric strengthening, ROM exercises and PROM   Recommended Other Services n/a   Consulted and Agree with Plan of Care Patient      Patient will benefit from skilled therapeutic intervention in order to improve the following deficits and impairments:  Decreased activity tolerance, Decreased mobility, Decreased range of motion, Decreased strength, Impaired UE functional use, Pain, Increased muscle spasms  Visit Diagnosis: Acute pain of left shoulder  Muscle weakness (generalized)  Abnormal posture      G-Codes - 01-01-2017 0822    Functional Assessment Tool Used (Outpatient Only) FOTO and clinical impression   Functional Limitation Carrying, moving and handling objects   Carrying, Moving and Handling Objects Current Status (R6045) At least 60 percent but less than 80 percent impaired, limited or restricted   Carrying, Moving and Handling Objects Goal Status (W0981) At least 40 percent but less than 60 percent impaired, limited or restricted       Problem List Patient Active Problem List   Diagnosis Date Noted  . Dyspnea on exertion 07/27/2016  . RBBB 03/04/2016  . OSA on CPAP 03/04/2016  . ILD (interstitial lung disease) (HCC)  c/w NSIP p macrodantin exp 02/11/2016  . Morbid (severe) obesity due to excess calories (HCC) 02/11/2016  . Chronic respiratory failure with hypoxia (HCC) 01/07/2016  .  Upper airway cough syndrome 12/06/2015  . COPD GOLD 0 12/05/2015  . Acute cholecystitis 07/19/2014    Vincente Poli, PT 12/08/2016, 6:07 PM  [facility] [address] [place], [place], [address] Phone: [phone]   Fax:  [phone]  Name: Lindsay Brady MRN: [id number] Date of Birth: Oct 27, 1950

## 2016-12-08 NOTE — Telephone Encounter (Signed)
I called and left a voicemail for the patient advising that Dr. Lajoyce Cornersuda did write another prescription for percocet. However she needs to be weaning herself off of this. This medication is addictive in nature and she needs to be trying to get off this medication. Advised rx is at the front desk for pick up.

## 2016-12-15 ENCOUNTER — Ambulatory Visit: Payer: Medicare Other

## 2016-12-15 DIAGNOSIS — M25512 Pain in left shoulder: Secondary | ICD-10-CM

## 2016-12-15 DIAGNOSIS — R293 Abnormal posture: Secondary | ICD-10-CM

## 2016-12-15 DIAGNOSIS — M6281 Muscle weakness (generalized): Secondary | ICD-10-CM

## 2016-12-15 NOTE — Therapy (Signed)
[facility] [address] [place], [place], [address] Phone: [phone]   Fax:  [phone]  Physical Therapy Treatment  Patient Details  Name: Lindsay Brady MRN: [id number] Date of Birth: [birth year] Referring Provider: Aldean Baker   Encounter Date: 12/15/2016      PT End of Session - 12/15/16 1419    Visit Number 2   Date for PT Re-Evaluation 02/01/17   PT Start Time 1405  pt. arrived late    PT Stop Time 1445   PT Time Calculation (min) 40 min   Activity Tolerance Patient limited by pain   Behavior During Therapy [facility] for tasks assessed/performed      Past Medical History:  Diagnosis Date  . Anxiety   . Arthritis   . Cholecystitis 07/2014  . COPD (chronic obstructive pulmonary disease) (HCC)   . Diabetes mellitus without complication (HCC)    type 2   . Fracture 2013 ?   left leg  . GERD (gastroesophageal reflux disease)    hx of gerd  . Headache   . Hx: UTI (urinary tract infection)   . OA (osteoarthritis) of knee   . Pneumonia    hx of viral pna years ago  . Shortness of breath dyspnea   . Sleep apnea    uses cpap  . Urgency of urination     Past Surgical History:  Procedure Laterality Date  . ABDOMINAL HYSTERECTOMY    . CARPAL TUNNEL RELEASE    . CHOLECYSTECTOMY N/A 07/19/2014   Procedure: LAPAROSCOPIC CHOLECYSTECTOMY WITH INTRAOPERATIVE CHOLANGIOGRAM;  Surgeon: Violeta Gelinas, MD;  Location: [facility] OR;  Service: General;  Laterality: N/A;  . COLONOSCOPY  ?   2011  . KNEE ARTHROSCOPY    . TONSILLECTOMY      There were no vitals filed for this visit.      Subjective Assessment - 12/15/16 1408    Subjective Pt. noting she took pain pill before coming to therapy today.     Patient Stated Goals have it back before it started   Currently in Pain? Yes   Pain Score 2    Pain Location Shoulder   Pain Orientation Left   Pain Descriptors / Indicators Aching  "distorted"     Pain Type Acute  pain   Pain Radiating Towards mid arm    Pain Onset More than a month ago   Pain Frequency Intermittent   Multiple Pain Sites No                         [facility] Adult PT Treatment/Exercise - 12/15/16 1431      Shoulder Exercises: Pulleys   Flexion 3 minutes   ABduction 3 minutes     Shoulder Exercises: Isometric Strengthening   Flexion 5X10"   Flexion Limitations into wall    Extension 5X10"   Extension Limitations into wall    External Rotation 5X10"   External Rotation Limitations into wall    Internal Rotation 5X10"  into wall    ABduction 5X10"   ABduction Limitations into     Manual Therapy   Manual Therapy Scapular mobilization;Myofascial release   Manual therapy comments Seated    Myofascial Release TPR to L UT just superior to superior angle; Pt. noting good relief from this    Scapular Mobilization STM to bilateral UT to relief tension and aching pain especially in L UT; pt. noting great relief from this  Neck Exercises: Stretches   Upper Trapezius Stretch 2 reps;30 seconds   Upper Trapezius Stretch Limitations L UT; cues for technique; good relief from tension with this   Levator Stretch 2 reps;30 seconds   Levator Stretch Limitations L; good relief from tension with this    Corner Stretch 1 rep;30 seconds   Corner Stretch Limitations Low version at doorway                PT Education - 12/15/16 1730    Education provided Yes   Education Details L shoulder isometrics, L UT, Levator Scap. stretch due to tightness    Person(s) Educated Patient   Methods Explanation;Demonstration;Verbal cues;Handout   Comprehension Verbalized understanding;Returned demonstration;Verbal cues required;Need further instruction          PT Short Term Goals - 12/15/16 1735      PT SHORT TERM GOAL #1   Title pt will feel more steady when getting out of bed due to increased strength and body awareness   Time 4   Period Weeks   Status On-going      PT SHORT TERM GOAL #2   Title independent with initial HEP   Time 4   Period Weeks   Status On-going     PT SHORT TERM GOAL #3   Title improved PROM to 120 deg flexion and 90 deg external rotation of left shoulder   Time 4   Period Weeks   Status On-going     PT SHORT TERM GOAL #4   Title left shoulder strength improved to be able to peform AROM flexion and abduction to 90 degrees for improved functional lifting activities   Time 4   Period Weeks   Status On-going     PT SHORT TERM GOAL #5   Title pain reduced by 25%   Baseline 8/10 pain at worst   Time 4   Period Weeks   Status On-going           PT Long Term Goals - 12/15/16 1735      PT LONG TERM GOAL #1   Title patient able to get dressed by herself due to improved strength and ROM   Baseline daughter helps   Time 12   Period Weeks   Status On-going     PT LONG TERM GOAL #2   Title Pt able to perform overhead activities such as putting things in her cabinets due to improved strength and AROM   Time 12   Period Weeks   Status On-going     PT LONG TERM GOAL #3   Title independent with advanced HEP   Time 12   Period Weeks   Status On-going     PT LONG TERM GOAL #4   Title FOTO < or = to 44% limited   Time 12   Period Weeks   Status On-going     PT LONG TERM GOAL #5   Title pain reduced by 60% or more   Baseline 8/10 pain at worst   Time 12   Period Weeks   Status On-going               Plan - 12/15/16 1730    Clinical Impression Statement Pt. doing well today.  Noting some tightness and aching pain in L upper trapezius today, which she says, keeps her awake at night.  This tightness relieved with manual STM/TPR to L UT and stretching.  HEP updated with stretches for UT, and Levator scap.  for continued relief.  Isometrics initiated today without issue.  HEP updated with isometrics with instruction for pt. to only perform at 50% effort.  Pt. seems to be progressing well at this point.   PT  Treatment/Interventions ADLs/Self Care Home Management;Biofeedback;Cryotherapy;Electrical Stimulation;Iontophoresis 4mg /ml Dexamethasone;Moist Heat;Ultrasound;Functional mobility training;Therapeutic activities;Therapeutic exercise;Neuromuscular re-education;Passive range of motion;Patient/family education;Dry needling;Taping;Manual techniques   PT Next Visit Plan Continue isometric strengthening and monitor tolerance to updated HEP, ROM exercises and PROM      Patient will benefit from skilled therapeutic intervention in order to improve the following deficits and impairments:  Decreased activity tolerance, Decreased mobility, Decreased range of motion, Decreased strength, Impaired UE functional use, Pain, Increased muscle spasms  Visit Diagnosis: Acute pain of left shoulder  Muscle weakness (generalized)  Abnormal posture     Problem List Patient Active Problem List   Diagnosis Date Noted  . Dyspnea on exertion 07/27/2016  . RBBB 03/04/2016  . OSA on CPAP 03/04/2016  . ILD (interstitial lung disease) (HCC)  c/w NSIP p macrodantin exp 02/11/2016  . Morbid (severe) obesity due to excess calories (HCC) 02/11/2016  . Chronic respiratory failure with hypoxia (HCC) 01/07/2016  . Upper airway cough syndrome 12/06/2015  . COPD GOLD 0 12/05/2015  . Acute cholecystitis 07/19/2014    Kermit Balo, PTA 12/15/16 5:39 PM  [facility] [address] [place], [place], [address] Phone: [phone]   Fax:  [phone]  Name: Lindsay Brady MRN: [id number] Date of Birth: 1951/02/11

## 2016-12-20 ENCOUNTER — Ambulatory Visit (INDEPENDENT_AMBULATORY_CARE_PROVIDER_SITE_OTHER): Payer: Medicare Other

## 2016-12-20 ENCOUNTER — Ambulatory Visit (INDEPENDENT_AMBULATORY_CARE_PROVIDER_SITE_OTHER): Payer: Medicare Other | Admitting: Orthopedic Surgery

## 2016-12-20 DIAGNOSIS — S42295D Other nondisplaced fracture of upper end of left humerus, subsequent encounter for fracture with routine healing: Secondary | ICD-10-CM

## 2016-12-20 DIAGNOSIS — S43015D Anterior dislocation of left humerus, subsequent encounter: Secondary | ICD-10-CM

## 2016-12-20 DIAGNOSIS — S43015A Anterior dislocation of left humerus, initial encounter: Secondary | ICD-10-CM | POA: Insufficient documentation

## 2016-12-20 DIAGNOSIS — G5601 Carpal tunnel syndrome, right upper limb: Secondary | ICD-10-CM

## 2016-12-20 DIAGNOSIS — M25512 Pain in left shoulder: Secondary | ICD-10-CM | POA: Diagnosis not present

## 2016-12-20 DIAGNOSIS — S42202D Unspecified fracture of upper end of left humerus, subsequent encounter for fracture with routine healing: Secondary | ICD-10-CM | POA: Insufficient documentation

## 2016-12-20 NOTE — Progress Notes (Signed)
Office Visit Note   Patient: Lindsay Brady           Date of Birth: Mar 06, 1951           MRN: [id number] Visit Date: 12/20/2016              Requested by: Tegeler, Smith Mince, MD No address on file PCP: Tegeler, Smith Mince, MD  Chief Complaint  Patient presents with  . Left Shoulder - Follow-up      HPI: Patient presents nearly 2 months status post left shoulder dislocation with reduction as well as a greater tuberosity fracture which is also reduced.   Has been in PT outpatient. Wonders whether this has healed. Wonders how much weight she can lift. Wonders why arm and hand continue to swell.  Complaining of left hand numbness, tingling and pain, which is worse at night. Swelling worsens over course of day.    Assessment & Plan: Visit Diagnoses:  1. Other closed nondisplaced fracture of proximal end of left humerus with routine healing, subsequent encounter   2. Carpal tunnel syndrome, right upper limb   3. Anterior dislocation of left shoulder, subsequent encounter   4. Acute pain of left shoulder     Plan: Continue PT. Recommended she wear a wrist splint at night. Use Naproxen bid x 2 weeks for swelling and pain. Use squeeze ball throughout day.   Follow-Up Instructions: Return in about 4 weeks (around 01/17/2017).   Left Hand Exam   Range of Motion   Wrist  Left wrist flexion: painful.   Tests  Phalen's Sign: positive Tinel's Sign (Medial Nerve): positive  Other  Erythema: absent Sensation: normal Pulse: present   Left Shoulder Exam   Tenderness  The patient is experiencing no tenderness.     Range of Motion  Active Abduction: 80   Muscle Strength  The patient has normal left shoulder strength.      Patient is alert, oriented, no adenopathy, well-dressed, normal affect, normal respiratory effort. Examination patient is developing decreased range of motion left shoulder. Her left upper extremity is neurovascularly intact.  Imaging: Xr  Shoulder Left  Result Date: 12/20/2016 Radiographs of the left shoulder show stable alignment with interval callus formation of proximal humerus fracture.    Labs: Lab Results  Component Value Date   ESRSEDRATE 34 (H) 09/08/2016   ESRSEDRATE 31 (H) 07/27/2016   ESRSEDRATE 26 06/15/2016   CRP 2.4 01/07/2016    Orders:  Orders Placed This Encounter  Procedures  . XR Shoulder Left   No orders of the defined types were placed in this encounter.    Procedures: No procedures performed  Clinical Data: No additional findings.  ROS:  All other systems negative, except as noted in the HPI. Review of Systems  Constitutional: Negative for chills and fever.  Musculoskeletal: Positive for arthralgias, joint swelling and myalgias.  Neurological: Positive for numbness. Negative for weakness.    Objective: Vital Signs: There were no vitals taken for this visit.  Specialty Comments:  No specialty comments available.  PMFS History: Patient Active Problem List   Diagnosis Date Noted  . Anterior dislocation of left shoulder 12/20/2016  . Closed fracture of proximal end of left humerus with routine healing 12/20/2016  . Dyspnea on exertion 07/27/2016  . RBBB 03/04/2016  . OSA on CPAP 03/04/2016  . ILD (interstitial lung disease) (HCC)  c/w NSIP p macrodantin exp 02/11/2016  . Morbid (severe) obesity due to excess calories (HCC) 02/11/2016  . Chronic respiratory  failure with hypoxia (HCC) 01/07/2016  . Upper airway cough syndrome 12/06/2015  . COPD GOLD 0 12/05/2015  . Acute cholecystitis 07/19/2014   Past Medical History:  Diagnosis Date  . Anxiety   . Arthritis   . Cholecystitis 07/2014  . COPD (chronic obstructive pulmonary disease) (HCC)   . Diabetes mellitus without complication (HCC)    type 2   . Fracture 2013 ?   left leg  . GERD (gastroesophageal reflux disease)    hx of gerd  . Headache   . Hx: UTI (urinary tract infection)   . OA (osteoarthritis) of knee     . Pneumonia    hx of viral pna years ago  . Shortness of breath dyspnea   . Sleep apnea    uses cpap  . Urgency of urination     Family History  Problem Relation Age of Onset  . Stroke Mother   . Lung cancer Father        smoked  . Colon cancer Father   . Cancer Paternal Grandmother   . Cancer Paternal Grandfather     Past Surgical History:  Procedure Laterality Date  . ABDOMINAL HYSTERECTOMY    . CARPAL TUNNEL RELEASE    . CHOLECYSTECTOMY N/A 07/19/2014   Procedure: LAPAROSCOPIC CHOLECYSTECTOMY WITH INTRAOPERATIVE CHOLANGIOGRAM;  Surgeon: Violeta GelinasBurke Thompson, MD;  [facility];  Service: General;  Laterality: N/A;  . COLONOSCOPY  ?   2011  . KNEE ARTHROSCOPY    . TONSILLECTOMY     Social History   Occupational History  . Not on file.   Social History Main Topics  . Smoking status: Former Smoker    Packs/day: 2.00    Years: 30.00    Types: Cigarettes    Quit date: 05/31/1996  . Smokeless tobacco: Never Used  . Alcohol use No     Comment: rare  . Drug use: No  . Sexual activity: Not on file

## 2016-12-22 ENCOUNTER — Ambulatory Visit: Payer: Medicare Other

## 2016-12-22 DIAGNOSIS — M6281 Muscle weakness (generalized): Secondary | ICD-10-CM

## 2016-12-22 DIAGNOSIS — M25512 Pain in left shoulder: Secondary | ICD-10-CM | POA: Diagnosis not present

## 2016-12-22 DIAGNOSIS — R293 Abnormal posture: Secondary | ICD-10-CM

## 2016-12-22 NOTE — Therapy (Signed)
[facility] [address], [address] [place], [place], [address] Phone: [phone]   Fax:  [phone]  Physical Therapy Treatment  Patient Details  Name: Lindsay Brady MRN: [id number] Date of Birth: 1951/02/14 Referring Provider: Aldean BakerUDA, MARCUS   Encounter Date: 12/22/2016      PT End of Session - 12/22/16 1442    Visit Number 3   Date for PT Re-Evaluation 02/01/17   PT Start Time 1437   PT Stop Time 1520   PT Time Calculation (min) 43 min   Activity Tolerance Patient limited by pain   Behavior [facility] for tasks assessed/performed      Past Medical History:  Diagnosis Date  . Anxiety   . Arthritis   . Cholecystitis 07/2014  . COPD (chronic obstructive pulmonary disease) (HCC)   . Diabetes mellitus without complication (HCC)    type 2   . Fracture 2013 ?   left leg  . GERD (gastroesophageal reflux disease)    hx of gerd  . Headache   . Hx: UTI (urinary tract infection)   . OA (osteoarthritis) of knee   . Pneumonia    hx of viral pna years ago  . Shortness of breath dyspnea   . Sleep apnea    uses cpap  . Urgency of urination     Past Surgical History:  Procedure Laterality Date  . ABDOMINAL HYSTERECTOMY    . CARPAL TUNNEL RELEASE    . CHOLECYSTECTOMY N/A 07/19/2014   Procedure: LAPAROSCOPIC CHOLECYSTECTOMY WITH INTRAOPERATIVE CHOLANGIOGRAM;  Surgeon: Violeta GelinasBurke Thompson, MD;  [facility];  Service: General;  Laterality: N/A;  . COLONOSCOPY  ?   2011  . KNEE ARTHROSCOPY    . TONSILLECTOMY      There were no vitals filed for this visit.      Subjective Assessment - 12/22/16 1435    Subjective Pt. noting still not able to sleep on L side.  Saw MD regarding swelling in L hand and wrist with MD considering this due to carpal tunnel.  Imaging showing good healing of fracture site.     Patient Stated Goals have it back before it started   Currently in Pain? Yes   Pain Score 2    Pain Location  Shoulder   Pain Orientation Left   Pain Descriptors / Indicators Tightness;Aching   Pain Type Acute pain   Pain Onset More than a month ago   Pain Frequency Intermittent   Aggravating Factors  moving the arm   Multiple Pain Sites No                         [facility] Adult PT Treatment/Exercise - 12/22/16 1456      Shoulder Exercises: Supine   External Rotation AAROM;15 reps;Left   Theraband Level (Shoulder External Rotation) --  wand supine    Flexion AAROM;15 reps;Left   Flexion Limitations wand; supine      Shoulder Exercises: Standing   Protraction 20 reps;Weights   Protraction Limitations 2#   Flexion AAROM;Left;5 reps   Flexion Limitations AAROM on finger ladder    ABduction AAROM;Left;5 reps   ABduction Limitations AAROM on finger ladder      Shoulder Exercises: Pulleys   Flexion 3 minutes   ABduction 3 minutes  scaption      Shoulder Exercises: Isometric Strengthening   Flexion 5X5"   Flexion Limitations into wall    Extension 5X5"   Extension Limitations into wall  External Rotation 5X5"   External Rotation Limitations into wall    Internal Rotation 5X5"   ABduction 5X5"   ABduction Limitations into wall      Shoulder Exercises: Stretch   Other Shoulder Stretches L shoulder IR stretch with towel 5 x 10 reps     Manual Therapy   Manual Therapy Passive ROM   Manual therapy comments supine    Myofascial Release L shoulder PROM to pt. tolerance with gentle hold stretch to improve ROM     Neck Exercises: Stretches   Upper Trapezius Stretch 2 reps;30 seconds   Upper Trapezius Stretch Limitations L UT; cues for technique; good relief from tension with this   Levator Stretch 2 reps;30 seconds   Levator Stretch Limitations L; good relief from tension with this                 PT Education - 12/22/16 1530    Education provided Yes   Education Details wand ER, Wand flexion    Person(s) Educated Patient   Methods  Explanation;Demonstration;Verbal cues;Handout   Comprehension Verbalized understanding;Returned demonstration;Verbal cues required;Need further instruction          PT Short Term Goals - 12/15/16 1735      PT SHORT TERM GOAL #1   Title pt will feel more steady when getting out of bed due to increased strength and body awareness   Time 4   Period Weeks   Status On-going     PT SHORT TERM GOAL #2   Title independent with initial HEP   Time 4   Period Weeks   Status On-going     PT SHORT TERM GOAL #3   Title improved PROM to 120 deg flexion and 90 deg external rotation of left shoulder   Time 4   Period Weeks   Status On-going     PT SHORT TERM GOAL #4   Title left shoulder strength improved to be able to peform AROM flexion and abduction to 90 degrees for improved functional lifting activities   Time 4   Period Weeks   Status On-going     PT SHORT TERM GOAL #5   Title pain reduced by 25%   Baseline 8/10 pain at worst   Time 4   Period Weeks   Status On-going           PT Long Term Goals - 12/15/16 1735      PT LONG TERM GOAL #1   Title patient able to get dressed by herself due to improved strength and ROM   Baseline daughter helps   Time 12   Period Weeks   Status On-going     PT LONG TERM GOAL #2   Title Pt able to perform overhead activities such as putting things in her cabinets due to improved strength and AROM   Time 12   Period Weeks   Status On-going     PT LONG TERM GOAL #3   Title independent with advanced HEP   Time 12   Period Weeks   Status On-going     PT LONG TERM GOAL #4   Title FOTO < or = to 44% limited   Time 12   Period Weeks   Status On-going     PT LONG TERM GOAL #5   Title pain reduced by 60% or more   Baseline 8/10 pain at worst   Time 12   Period Weeks   Status On-going  Plan - 12/22/16 1452    Clinical Impression Statement Lindsay Brady doing well today noting relief from upper shoulder soreness and  tightness with home stretches.  Saw MD with imaging showing routine healing of fx site.  MD considering L wrist swelling from carpal tunnel.  Feels shoulder is improving.  Pain free with home isometrics now.  Progressed into more AAROM activities today with cueing required to prevent excessive shoulder elevation.  PROM today with gentle holds at end ranges to improve L shoulder ROM.  Pt. noting feelings of "restriction" in overhead motions today without significant pain however would benefit from further manual stretching and PROM with this.  Seems to be progressing well at this point noting she does not need "heavy pain meds" anymore.  HEP updated with wand ROM activities to continue to improve ROM.     PT Treatment/Interventions ADLs/Self Care Home Management;Biofeedback;Cryotherapy;Electrical Stimulation;Iontophoresis 4mg /ml Dexamethasone;Moist Heat;Ultrasound;Functional mobility training;Therapeutic activities;Therapeutic exercise;Neuromuscular re-education;Passive range of motion;Patient/family education;Dry needling;Taping;Manual techniques   PT Next Visit Plan Progress into AAROM/AROM as pt. able; monitor tolerance to updated HEP, ROM exercises and PROM      Patient will benefit from skilled therapeutic intervention in order to improve the following deficits and impairments:  Decreased activity tolerance, Decreased mobility, Decreased range of motion, Decreased strength, Impaired UE functional use, Pain, Increased muscle spasms  Visit Diagnosis: Acute pain of left shoulder  Muscle weakness (generalized)  Abnormal posture     Problem List Patient Active Problem List   Diagnosis Date Noted  . Anterior dislocation of left shoulder 12/20/2016  . Closed fracture of proximal end of left humerus with routine healing 12/20/2016  . Dyspnea on exertion 07/27/2016  . RBBB 03/04/2016  . OSA on CPAP 03/04/2016  . ILD (interstitial lung disease) (HCC)  c/w NSIP p macrodantin exp 02/11/2016  .  Morbid (severe) obesity due to excess calories (HCC) 02/11/2016  . Chronic respiratory failure with hypoxia (HCC) 01/07/2016  . Upper airway cough syndrome 12/06/2015  . COPD GOLD 0 12/05/2015  . Acute cholecystitis 07/19/2014    Kermit BaloMicah Isiac Breighner, PTA 12/22/16 5:35 PM  [facility] [address], [address] [place], [place], [address] Phone: [phone]   Fax:  [phone]  Name: Lindsay ColaceJudy Brady MRN: [id number] Date of Birth: June 14, 1950

## 2016-12-27 ENCOUNTER — Telehealth (INDEPENDENT_AMBULATORY_CARE_PROVIDER_SITE_OTHER): Payer: Self-pay | Admitting: Orthopedic Surgery

## 2016-12-27 ENCOUNTER — Other Ambulatory Visit (INDEPENDENT_AMBULATORY_CARE_PROVIDER_SITE_OTHER): Payer: Self-pay | Admitting: Orthopedic Surgery

## 2016-12-27 MED ORDER — OXYCODONE-ACETAMINOPHEN 5-325 MG PO TABS: 1.0000 | ORAL_TABLET | Freq: Two times a day (BID) | ORAL | 0 refills | Status: DC | PRN

## 2016-12-27 NOTE — Telephone Encounter (Signed)
Patient called needing Rx refilled (Percocet) The number to contact patient is [phone]

## 2016-12-27 NOTE — Telephone Encounter (Signed)
rx written

## 2016-12-27 NOTE — Telephone Encounter (Signed)
2 months status post left shoulder dislocation with reduction as well as a greater tuberosity fracture which is also reduced.  pt is requesting refill on percocet. Last refill was 12/09/16 #20

## 2016-12-27 NOTE — Telephone Encounter (Signed)
Called and lm on vm to advise that the rx is at the front desk for pick up.

## 2016-12-29 ENCOUNTER — Encounter: Payer: Self-pay | Admitting: Physical Therapy

## 2016-12-29 ENCOUNTER — Ambulatory Visit: Payer: Medicare Other | Attending: Orthopedic Surgery | Admitting: Physical Therapy

## 2016-12-29 DIAGNOSIS — M25512 Pain in left shoulder: Secondary | ICD-10-CM | POA: Diagnosis not present

## 2016-12-29 DIAGNOSIS — R293 Abnormal posture: Secondary | ICD-10-CM | POA: Diagnosis present

## 2016-12-29 DIAGNOSIS — M6281 Muscle weakness (generalized): Secondary | ICD-10-CM | POA: Diagnosis present

## 2016-12-29 NOTE — Patient Instructions (Signed)
New Exercises:  Standing: Use towel standing or seated and stand sideways at the counter top: Push arm away from you on an angle 20x. Then stand facing the counter and do a rainbow shape, bending the elbow at the end & beginning of the shape. Do 20x. Do These 3x day.

## 2016-12-29 NOTE — Therapy (Signed)
[facility] [address], [address] [place], [place], [address] Phone: [phone]   Fax:  [phone]  Physical Therapy Treatment  Patient Details  Name: Lindsay Brady MRN: [id number] Date of Birth: 1950/12/09 Referring Provider: Aldean Baker   Encounter Date: 12/29/2016      PT End of Session - 12/29/16 1453    Visit Number 4   Date for PT Re-Evaluation 02/01/17   PT Start Time 1450  Pt late   PT Stop Time 1528   PT Time Calculation (min) 38 min   Activity Tolerance Patient tolerated treatment well   Behavior During Therapy [facility] for tasks assessed/performed      Past Medical History:  Diagnosis Date  . Anxiety   . Arthritis   . Cholecystitis 07/2014  . COPD (chronic obstructive pulmonary disease) (HCC)   . Diabetes mellitus without complication (HCC)    type 2   . Fracture 2013 ?   left leg  . GERD (gastroesophageal reflux disease)    hx of gerd  . Headache   . Hx: UTI (urinary tract infection)   . OA (osteoarthritis) of knee   . Pneumonia    hx of viral pna years ago  . Shortness of breath dyspnea   . Sleep apnea    uses cpap  . Urgency of urination     Past Surgical History:  Procedure Laterality Date  . ABDOMINAL HYSTERECTOMY    . CARPAL TUNNEL RELEASE    . CHOLECYSTECTOMY N/A 07/19/2014   Procedure: LAPAROSCOPIC CHOLECYSTECTOMY WITH INTRAOPERATIVE CHOLANGIOGRAM;  Surgeon: Violeta Gelinas, MD;  Location: [facility] OR;  Service: General;  Laterality: N/A;  . COLONOSCOPY  ?   2011  . KNEE ARTHROSCOPY    . TONSILLECTOMY      There were no vitals filed for this visit.      Subjective Assessment - 12/29/16 1454    Subjective Hurt more after last session, hurt all week, no pain today.    Currently in Pain? No/denies   Multiple Pain Sites No                         [facility] Adult PT Treatment/Exercise - 12/29/16 0001      Shoulder Exercises: Standing   Flexion --  Finger ladder 10x flexion   ABduction --  More scaption on finger ladder with Asst from PTA up & down     Shoulder Exercises: Pulleys   Flexion 3 minutes   ABduction 3 minutes     Shoulder Exercises: ROM/Strengthening   UBE (Upper Arm Bike) L2 x 5 min forward     Shoulder Exercises: Isometric Strengthening   Flexion Limitations into ball at the wall, elbow bent 8x 3 sec hold   ADduction --  squeezing ball 8 x 2 sec hold     Shoulder Exercises: Stretch   Table Stretch - Flexion --  rainbow 20x   Table Stretch - Abduction --  20x     Neck Exercises: Stretches   Upper Trapezius Stretch 2 reps;30 seconds   Upper Trapezius Stretch Limitations L UT; cues for technique; good relief from tension with this                PT Education - 12/29/16 1510    Education provided Yes   Education Details Seated towel abduction and rainbow AROM   Person(s) Educated Patient   Methods Explanation;Demonstration;Verbal cues   Comprehension Verbalized understanding;Returned demonstration  PT Short Term Goals - 12/15/16 1735      PT SHORT TERM GOAL #1   Title pt will feel more steady when getting out of bed due to increased strength and body awareness   Time 4   Period Weeks   Status On-going     PT SHORT TERM GOAL #2   Title independent with initial HEP   Time 4   Period Weeks   Status On-going     PT SHORT TERM GOAL #3   Title improved PROM to 120 deg flexion and 90 deg external rotation of left shoulder   Time 4   Period Weeks   Status On-going     PT SHORT TERM GOAL #4   Title left shoulder strength improved to be able to peform AROM flexion and abduction to 90 degrees for improved functional lifting activities   Time 4   Period Weeks   Status On-going     PT SHORT TERM GOAL #5   Title pain reduced by 25%   Baseline 8/10 pain at worst   Time 4   Period Weeks   Status On-going           PT Long Term Goals - 12/15/16 1735      PT LONG TERM GOAL #1   Title patient able to  get dressed by herself due to improved strength and ROM   Baseline daughter helps   Time 12   Period Weeks   Status On-going     PT LONG TERM GOAL #2   Title Pt able to perform overhead activities such as putting things in her cabinets due to improved strength and AROM   Time 12   Period Weeks   Status On-going     PT LONG TERM GOAL #3   Title independent with advanced HEP   Time 12   Period Weeks   Status On-going     PT LONG TERM GOAL #4   Title FOTO < or = to 44% limited   Time 12   Period Weeks   Status On-going     PT LONG TERM GOAL #5   Title pain reduced by 60% or more   Baseline 8/10 pain at worst   Time 12   Period Weeks   Status On-going               Plan - 12/29/16 1453    Clinical Impression Statement Verbal and tactile instruction in proper scapular position when performing shoulder flexion. Educated pt in scapular depression  vs elevation during her isometrics. pt was successful with this but required a lot of attention. No significant pain durint session, some mild stretching pain.    Rehab Potential Excellent   PT Frequency 1x / week   PT Duration 12 weeks   PT Treatment/Interventions ADLs/Self Care Home Management;Biofeedback;Cryotherapy;Electrical Stimulation;Iontophoresis 4mg /ml Dexamethasone;Moist Heat;Ultrasound;Functional mobility training;Therapeutic activities;Therapeutic exercise;Neuromuscular re-education;Passive range of motion;Patient/family education;Dry needling;Taping;Manual techniques   PT Next Visit Plan Progress into AAROM/AROM as pt. able; monitor tolerance to updated HEP, ROM exercises and PROM   Consulted and Agree with Plan of Care --      Patient will benefit from skilled therapeutic intervention in order to improve the following deficits and impairments:  Decreased activity tolerance, Decreased mobility, Decreased range of motion, Decreased strength, Impaired UE functional use, Pain, Increased muscle spasms  Visit  Diagnosis: Acute pain of left shoulder  Muscle weakness (generalized)  Abnormal posture     Problem List Patient Active  Problem List   Diagnosis Date Noted  . Anterior dislocation of left shoulder 12/20/2016  . Closed fracture of proximal end of left humerus with routine healing 12/20/2016  . Dyspnea on exertion 07/27/2016  . RBBB 03/04/2016  . OSA on CPAP 03/04/2016  . ILD (interstitial lung disease) (HCC)  c/w NSIP p macrodantin exp 02/11/2016  . Morbid (severe) obesity due to excess calories (HCC) 02/11/2016  . Chronic respiratory failure with hypoxia (HCC) 01/07/2016  . Upper airway cough syndrome 12/06/2015  . COPD GOLD 0 12/05/2015  . Acute cholecystitis 07/19/2014    Jeanpaul Biehl, PTA 12/29/2016, 3:27 PM  [facility] [address], [address] [place], [place], [address] Phone: [phone]   Fax:  [phone]  Name: Erick ColaceJudy Ranta MRN: [id number] Date of Birth: [birth year]

## 2017-01-05 ENCOUNTER — Ambulatory Visit: Payer: Medicare Other | Admitting: Physical Therapy

## 2017-01-05 ENCOUNTER — Encounter: Payer: Self-pay | Admitting: Physical Therapy

## 2017-01-05 DIAGNOSIS — M25512 Pain in left shoulder: Secondary | ICD-10-CM | POA: Diagnosis not present

## 2017-01-05 DIAGNOSIS — R293 Abnormal posture: Secondary | ICD-10-CM

## 2017-01-05 DIAGNOSIS — M6281 Muscle weakness (generalized): Secondary | ICD-10-CM

## 2017-01-05 NOTE — Therapy (Signed)
[facility] [address] [place], [place], [address] Phone: [phone]   Fax:  [phone]  Physical Therapy Treatment  Patient Details  Name: Lindsay Brady MRN: [id number] Date of Birth: 02-23-1951 Referring Provider: Meridee Score   Encounter Date: 01/05/2017      PT End of Session - 01/05/17 Silvana    Visit Number 5   Date for PT Re-Evaluation 02/01/17   PT Start Time 1500  Pt 15 min late   PT Stop Time 1530   PT Time Calculation (min) 30 min   Activity Tolerance Patient tolerated treatment well   Behavior During Therapy [facility] for tasks assessed/performed      Past Medical History:  Diagnosis Date  . Anxiety   . Arthritis   . Cholecystitis 07/2014  . COPD (chronic obstructive pulmonary disease) ([place])   . Diabetes mellitus without complication ([place])    type 2   . Fracture 2013 ?   left leg  . GERD (gastroesophageal reflux disease)    hx of gerd  . Headache   . Hx: UTI (urinary tract infection)   . OA (osteoarthritis) of knee   . Pneumonia    hx of viral pna years ago  . Shortness of breath dyspnea   . Sleep apnea    uses cpap  . Urgency of urination     Past Surgical History:  Procedure Laterality Date  . ABDOMINAL HYSTERECTOMY    . CARPAL TUNNEL RELEASE    . CHOLECYSTECTOMY N/A 07/19/2014   Procedure: LAPAROSCOPIC CHOLECYSTECTOMY WITH INTRAOPERATIVE CHOLANGIOGRAM;  Surgeon: Georganna Skeans, MD;  Location: [place];  Service: General;  Laterality: N/A;  . COLONOSCOPY  ?   2011  . KNEE ARTHROSCOPY    . TONSILLECTOMY      There were no vitals filed for this visit.      Subjective Assessment - 01/05/17 1826    Subjective Pt 15 min late and computer network was down. Pt arrived at 15:00 and signed a paper AOB. No increased pain after last session. Describes her pain as back to her baseline level of pain.  She cannot do assisted scaption or abdution at home standing.    Currently in Pain? Yes   Pain  Score 2    Pain Location Shoulder   Pain Orientation Left   Pain Descriptors / Indicators Sore   Aggravating Factors  Moving the arm/using it.    Pain Relieving Factors rest   Multiple Pain Sites No                         [facility] Adult PT Treatment/Exercise - 01/05/17 0001      Shoulder Exercises: Supine   Other Supine Exercises LT shoulder abd into Rt sidebend to stretch lat     Shoulder Exercises: Seated   External Rotation AROM;Strengthening;Left;20 reps     Shoulder Exercises: Standing   Flexion --  Finger ladder 10x flexion     Shoulder Exercises: Pulleys   Flexion 3 minutes   ABduction 3 minutes  VC not to lean RT     Shoulder Exercises: Stretch   Other Shoulder Stretches Counter top lat stretch with PTA releasing the LT lattisimus.   Pt got dizzy and repeated in supine position.                 PT Education - 01/05/17 1836    Education provided Yes   Education Details Supine shld abd with  spine/trunk drop to the RT to stretch lats, seated ER at table   Person(s) Educated Patient   Methods Explanation;Demonstration;Tactile cues;Verbal cues;Handout  written handout given as computer was down   Comprehension Verbalized understanding;Returned demonstration          PT Short Term Goals - 01/05/17 1832      PT SHORT TERM GOAL #1   Title pt will feel more steady when getting out of bed due to increased strength and body awareness   Time 4   Period Weeks   Status Achieved  Feels steadier, but sleeping ON it is still painful     PT SHORT TERM GOAL #2   Title independent with initial HEP   Time 4   Period Weeks   Status Achieved     PT SHORT TERM GOAL #5   Title pain reduced by 25%   Time 4   Period Weeks   Status Achieved           PT Long Term Goals - 12/15/16 1735      PT LONG TERM GOAL #1   Title patient able to get dressed by herself due to improved strength and ROM   Baseline daughter helps   Time 12   Period Weeks    Status On-going     PT LONG TERM GOAL #2   Title Pt able to perform overhead activities such as putting things in her cabinets due to improved strength and AROM   Time 12   Period Weeks   Status On-going     PT LONG TERM GOAL #3   Title independent with advanced HEP   Time 12   Period Weeks   Status On-going     PT LONG TERM GOAL #4   Title FOTO < or = to 44% limited   Time 12   Period Weeks   Status On-going     PT LONG TERM GOAL #5   Title pain reduced by 60% or more   Baseline 8/10 pain at worst   Time 12   Period Weeks   Status On-going               Plan - 01/05/17 1829    Clinical Impression Statement Pt is more aware of her scapular position as she attempts to lift her arm. She is restricted by tightness in her lats and possibly the teres group.  Performed some release techniques during lactive lat stretch, this mad pt feel dizzt and we had we to stop. She did better in the supine position, no dizziness. Added to HEP today . Pt remains limited in ROM.  Met short term goal #1 & 2 today.   Rehab Potential Excellent   PT Frequency 1x / week   PT Duration 12 weeks   PT Treatment/Interventions ADLs/Self Care Home Management;Biofeedback;Cryotherapy;Electrical Stimulation;Iontophoresis 22m/ml Dexamethasone;Moist Heat;Ultrasound;Functional mobility training;Therapeutic activities;Therapeutic exercise;Neuromuscular re-education;Passive range of motion;Patient/family education;Dry needling;Taping;Manual techniques   PT Next Visit Plan Progress into AAROM/AROM as pt. able; monitor tolerance to updated HEP, ROM exercises and PROM   Consulted and Agree with Plan of Care Patient      Patient will benefit from skilled therapeutic intervention in order to improve the following deficits and impairments:  Decreased activity tolerance, Decreased mobility, Decreased range of motion, Decreased strength, Impaired UE functional use, Pain, Increased muscle spasms  Visit  Diagnosis: Acute pain of left shoulder  Muscle weakness (generalized)  Abnormal posture     Problem List Patient Active Problem List  Diagnosis Date Noted  . Anterior dislocation of left shoulder 12/20/2016  . Closed fracture of proximal end of left humerus with routine healing 12/20/2016  . Dyspnea on exertion 07/27/2016  . RBBB 03/04/2016  . OSA on CPAP 03/04/2016  . ILD (interstitial lung disease) ([place])  c/w NSIP p macrodantin exp 02/11/2016  . Morbid (severe) obesity due to excess calories ([place]) 02/11/2016  . Chronic respiratory failure with hypoxia ([place]) 01/07/2016  . Upper airway cough syndrome 12/06/2015  . COPD GOLD 0 12/05/2015  . Acute cholecystitis 07/19/2014    COCHRAN,JENNIFER, PTA 01/05/2017, 6:38 PM  [facility] [address], [place], [place], [address] Phone: [phone]   Fax:  [phone]  Name: Lindsay Brady MRN: [id number] Date of Birth: 1950/07/02

## 2017-01-10 ENCOUNTER — Ambulatory Visit (INDEPENDENT_AMBULATORY_CARE_PROVIDER_SITE_OTHER): Payer: Medicare Other | Admitting: Internal Medicine

## 2017-01-10 ENCOUNTER — Encounter: Payer: Self-pay | Admitting: Internal Medicine

## 2017-01-10 VITALS — BP 130/66 | HR 67 | Ht 65.0 in | Wt 259.0 lb

## 2017-01-10 DIAGNOSIS — R058 Other specified cough: Secondary | ICD-10-CM

## 2017-01-10 DIAGNOSIS — J849 Interstitial pulmonary disease, unspecified: Secondary | ICD-10-CM | POA: Diagnosis not present

## 2017-01-10 DIAGNOSIS — J9611 Chronic respiratory failure with hypoxia: Secondary | ICD-10-CM

## 2017-01-10 DIAGNOSIS — R05 Cough: Secondary | ICD-10-CM

## 2017-01-10 NOTE — Patient Instructions (Addendum)
In event coughing or wheezing worsens >  Try prilosec otc 20mg   Take 30-60 min before first meal of the day and Pepcid ac (famotidine) 20 mg one @  bedtime until cough is completely gone for at least a week without the need for cough suppression  Keep up the good work on the wt loss   Please schedule a follow up visit in 6  months but call sooner if needed

## 2017-01-10 NOTE — Progress Notes (Signed)
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     History of Present Illness  12/05/2015 1st [facility] office visit/ Lindsay Brady   Chief Complaint  Patient presents with  . Pulmonary Consult    Self referral. Pt states dxed with Emphysema in 2011. She has been seen at [facility] in the past. She c/o hoarseness and increased cough for the past yr.  Cough is prod with white to tan sputum.  She also c/o SOB with walking long distances and up stairs. Hot weather tends to make her breathing worse. She uses ventolin 2 x per day on average.   indolent onset doe x 6 y better on tudorza and spiriva (mouth dry) and no change on symbicort  Needs saba at least twice daily  Already did rehab with wt = ? But did well  Doe = MMRC3 = can't walk 100 yards even at a slow pace at a flat grade s stopping due to sob   Cough p stopped smoking got worse then better and resolved until one year prior to OV  And present daily since but never while on cpap / neg w/u reflux by GI but ent disagreed  rec Plan A = Automatic = symbicort 160 Take 2 puffs first thing in am and then another 2 puffs about 12 hours later / tudorza one twice daily  Plan B = Backup Only use your albuterol as a rescue medication GERD diet  For drainage / throat tickle try take CHLORPHENIRAMINE  4 mg - take one every 4 hours as needed     01/07/16 acute w/in with NP breathing a lot worse  Prednisone taper  Doxycyline x 7 days  Continue symbicort, tudorza  albuterol as needed  Will arrange  home O2  Will check some labs today  Will arrange an echocardiogram and cardiology referral     02/10/2016  Extended f/u ov/Lindsay Brady re: COPD 0/ probable ILD ? Macrodantin related ? When last dose ?  Chief Complaint  Patient presents with  . Follow-up    Cough and SOB had improved on pred and doxy and then worsened a few days after finished meds. Cough is prod and mucus gets hung in her throat. She has not needed to use albuterol since starting o2.   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  Follow-up    Breathing is doing well. She has been using 2lpm with rest and 3 with exertion. Her cough has improved some but she is still c/o hoarseness.      some better while on prednisone only and worse cough/ sob after stopped  rec Prednisone 10 mg  Take 2 daily until better then reduce to one daily x 2 weeks then one half daily     05/04/2016  f/u ov/Lindsay Brady re: ILD ? Macrodantin on 5 mg daily  Chief Complaint  Patient presents with  . Follow-up    4 week follow up. Breathing has been ok. Was bothered by allergies around 3 weeks ago with a cough and fatigue.   mmrc = 2 = MMRC2 = can't walk a nl pace on a flat grade s sob but does fine slow and flat eg does fine walking on 3lpm leaning a cart / cough has resolved  rec Please remember to go to the lab  department downstairs for your tests - we will call you with the results when they are available. Schedule High Resolution CT chest and PFT on same day in next two weeks at [facility]  Highest prednisone  dose is 20 mg and the lowest  Is 5 mg  Avoid food that high in fat or carbs  Avoid fried foods/ starchy foods and sweets.  Please schedule a follow up office visit in 6 weeks, call sooner if needed  - consider wean off symbicort as this is not copd   06/01/16 Uri/ doxy 10 > back to baseline     06/15/2016  f/u ov/Lindsay Brady re: PF on pred 10 mg daily  X 6 days p taper from 20 on 02 2lpm  / copd 0 on symb 160 2bid  Chief Complaint  Patient presents with  . Follow-up    Pt is following up for ILD.   doe = 3lpm leaning on cart can do HT only = MMRC3 = can't walk 100 yards even at a slow pace at a flat grade s stopping due to sob  / never uses saba prior to ex / can't tell symbicort helps when misses doses Doing recumbant bike x 10 min no resistance not checking sats/ very little insight   rec Try to build up duration on bike at least 20 minutes before adding resistance stop symbicort  Prednisone 10 mg  Take 2 daily until better then reduce to one daily x 2 weeks then one half daily  GERD diet    07/27/2016  f/u ov/Lindsay Brady re: PF ? Steroid resp now on 5 mg daily  Chief Complaint  Patient presents with  . Follow-up    Breathing is unchanged. She has been on pred 5 mg for the past 2.5 wks.    worse leg swelling since stopped diuretic/ no worse sob as tapered pred  Doe still = MMRC3 but also limited by knees  rec Take your fluid pill from your primary care doctor as needed for leg swelling  Decrease prednisone to 5 mg every other day x 2 weeks and stop and see if you note any change in symptoms Please remember to go to the lab  department downstairs in the basement  for your tests - we will call you with the results when they are available. Change 02 to 2lpm with cpap at bedtime and 3 lpm walking more than around the house and certainly with exercise but not need to take any longer at rest unless  you feel you need it to keep sats over 90%      09/08/2016  f/u ov/Lindsay Brady re:  PF ? Steroid responsive  off prednisone  Mid march 2018  Chief Complaint  Patient presents with  . Follow-up    Pt c/o wheezing for the past 2 wks. Her breathing is unchanged. She has been trying to cough more when she notices the wheezing and is producing minimal white sputum.    noct wheeze/ desats even on cpap/ 02 and not using saba and wheeze goes away on its own  No change ex tol  rec Try prilosec otc 28m  Take 30-60 min before first meal of the day and Pepcid ac (famotidine) 20 mg one @  bedtime until wheeze  is completely gone for at least a week or until you return, whichever comes first GERD diet      10/08/2016  f/u ov/Lindsay Brady re:  PF steroid resp/ no flare off prednisone, no need for saba  Chief Complaint  Patient presents with  . Follow-up    Breathing is about the same. No new co's today. She has not used albuterol.    rec Only use your albuterol (ventolin) as a rescue medication  Continue 02 2lpm at bedtime on cpap  and when walking farther/faster than you did for [place] To get the most out of exercise, you need to be continuously aware that you are short of breath    01/10/2017  f/u ov/Lindsay Brady re:   PF steroid responsive/ cough better with chlorpheniramine  Chief Complaint  Patient presents with  . Follow-up    pt notes increased upper wheezing, some PND.    no longer on gerd rx / noct wheeze recurred since stopped gerd rx  Sleeping on cpap and 02 and "wheezing resolves  s saba" usually noct not daytime / coughs and clears out throat but s excess/ purulent sputum or mucus plugs    No obvious day to day or daytime variability or assoc  mucus plugs or hemoptysis or cp or chest tightness, subjective wheeze or overt sinus or hb symptoms. No unusual exp hx or h/o childhood pna/ asthma or knowledge of premature birth.  Sleeping ok without nocturnal  or early am exacerbation  of respiratory  c/o's or need for noct saba. Also denies any obvious fluctuation of symptoms with weather or environmental  changes or other aggravating or alleviating factors except as outlined above   Current Medications, Allergies, Complete Past Medical History, Past Surgical History, Family History, and Social History were reviewed in [employer].  ROS  The following are not active complaints unless bolded sore throat, dysphagia, dental problems, itching, sneezing,  nasal congestion or excess/ purulent secretions, ear ache,   fever, chills, sweats, unintended wt loss, classically pleuritic or exertional cp,  orthopnea pnd or leg swelling, presyncope, palpitations, abdominal pain, anorexia, nausea, vomiting, diarrhea  or change in bowel or bladder habits, change in stools or urine, dysuria,hematuria,  rash, arthralgias, visual complaints, headache, numbness, weakness or ataxia or problems with walking or coordination,  change in mood/affect or memory.                     Objective:   Physical Exam  Obese amb wf nad / all smiles  01/10/2017       259   10/08/2016       286  09/08/2016       294   07/27/2016  298  05/04/2016        290 03/23/2016     278   02/10/16 276 lb (125.2 kg)  01/07/16 274 lb (124.3 kg)  12/05/15 279 lb 3.2 oz (126.6 kg)     Vital signs reviewed  - - Note on arrival 02 sats  95% on RA     HEENT: nl dentition, turbinates, and oropharynx. Nl external ear canals without cough reflex   NECK :  without JVD/Nodes/TM/ nl carotid upstrokes bilaterally   LUNGS: no acc muscle use,  Nl contour chest with  Distant bs,clear to A and P   CV:  RRR  no s3 or murmur or increase in P2,   Trace lower  ext pitting  edema L > R    ABD:  soft and nontender with nl inspiratory excursion in the supine position. No bruits or organomegaly, bowel sounds nl  MS:  Nl gait/ ext warm without deformities, calf tenderness, cyanosis or clubbing No obvious joint restrictions   SKIN: warm and dry without lesions    NEURO:  alert, approp, nl sensorium with  no motor  deficits           Assessment & Plan:

## 2017-01-11 NOTE — Assessment & Plan Note (Addendum)
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 -  12/12/14  FVC = 2.11 - 06/19/15   FVC = 2.16  -  PFTs 05/18/16  FVC 2.07 (63%) no obst and dlco 52 corrects to 98 on 10 mg daily pred with esr 26 -  ESR 07/27/2016  =   31  @ pred 5 mg daily > wean off pred by mid March 2018 and no change in ESR 09/08/2016  - .HSP profile 09/08/2016 >  Neg -  Spirometry 09/08/2016  FVC  1.89 off pred   Doing fine off pred x for some noct "wheeze" which is most likely uacs and not asthma related so rec add back max gerd rx if flares/ stay off pred and work on wt loss   Each maintenance medication was reviewed in detail including most importantly the difference between maintenance and as needed and under what circumstances the prns are to be used.  Please see AVS for specific  Instructions which are unique to this visit and I personally typed out  which were reviewed in detail in writing with the patient and a copy provided.

## 2017-01-11 NOTE — Assessment & Plan Note (Signed)
SATURATION QUALIFICATIONS:  02/10/2016  Patient Saturations on Room Air at Rest = 90% Patient Saturations on Room Air while Ambulating = 83% Patient Saturations on 3 Liters of oxygen while Ambulating = 90% - 07/27/2016   Walked RA  2 laps @ 185 ft each stopped due to  desat to 88% RA - 10/08/2016   Walked RA  2 laps @ 185 ft each stopped due to sob/ sats 88% fast pace    rx as of 01/10/2017  =  2lpm with walking / ex (goal is to keep > 90%)  2lpm and rest and hs with cpap

## 2017-01-11 NOTE — Assessment & Plan Note (Signed)
Body mass index is 43.1 kg/m.  -  trending down/ encouraged  Lab Results  Component Value Date   TSH 1.51 07/27/2016     Contributing to gerd risk/ doe/reviewed the need and the process to achieve and maintain neg calorie balance > defer f/u primary care including intermittently monitoring thyroid status

## 2017-01-11 NOTE — Assessment & Plan Note (Signed)
Trial of gerd diet/ 1st gen H1 12/05/2015 >>> - trial off tudorza 02/10/2016 >>> - flare with pseudowheeze at ov 09/08/2016 > rec max gerd rx - Spirometry 09/08/2016  No obst s rx prior   - 01/10/2017 flared off gerd rx but very mild > rec restart prn  see avs for instructions unique to this ov

## 2017-01-12 ENCOUNTER — Ambulatory Visit: Payer: Medicare Other

## 2017-01-12 ENCOUNTER — Telehealth (INDEPENDENT_AMBULATORY_CARE_PROVIDER_SITE_OTHER): Payer: Self-pay | Admitting: Orthopedic Surgery

## 2017-01-12 DIAGNOSIS — M25512 Pain in left shoulder: Secondary | ICD-10-CM

## 2017-01-12 DIAGNOSIS — R293 Abnormal posture: Secondary | ICD-10-CM

## 2017-01-12 DIAGNOSIS — M6281 Muscle weakness (generalized): Secondary | ICD-10-CM

## 2017-01-12 NOTE — Therapy (Signed)
[facility] [address], [address] [place], [place], [address] Phone: [phone]   Fax:  [phone]  Physical Therapy Treatment  Patient Details  Name: Lindsay Brady MRN: [id number] Date of Birth: [birth year] Referring Provider: Aldean Baker   Encounter Date: 01/12/2017      PT End of Session - 01/12/17 1625    Visit Number 6   Date for PT Re-Evaluation 02/01/17   PT Start Time 1621   PT Stop Time 1702   PT Time Calculation (min) 41 min   Activity Tolerance Patient tolerated treatment well   Behavior During Therapy [facility] for tasks assessed/performed      Past Medical History:  Diagnosis Date  . Anxiety   . Arthritis   . Cholecystitis 07/2014  . COPD (chronic obstructive pulmonary disease) (HCC)   . Diabetes mellitus without complication (HCC)    type 2   . Fracture 2013 ?   left leg  . GERD (gastroesophageal reflux disease)    hx of gerd  . Headache   . Hx: UTI (urinary tract infection)   . OA (osteoarthritis) of knee   . Pneumonia    hx of viral pna years ago  . Shortness of breath dyspnea   . Sleep apnea    uses cpap  . Urgency of urination     Past Surgical History:  Procedure Laterality Date  . ABDOMINAL HYSTERECTOMY    . CARPAL TUNNEL RELEASE    . CHOLECYSTECTOMY N/A 07/19/2014   Procedure: LAPAROSCOPIC CHOLECYSTECTOMY WITH INTRAOPERATIVE CHOLANGIOGRAM;  Surgeon: Violeta Gelinas, MD;  Location[facility];  Service: General;  Laterality: N/A;  . COLONOSCOPY  ?   2011  . KNEE ARTHROSCOPY    . TONSILLECTOMY      There were no vitals filed for this visit.      Subjective Assessment - 01/12/17 1624    Subjective Pt. noting some soreness following last visit   Patient Stated Goals have it back before it started   Currently in Pain? No/denies   Pain Score 0-No pain   Multiple Pain Sites No                         [facility] Adult PT Treatment/Exercise - 01/12/17 1628      Shoulder  Exercises: Supine   External Rotation AAROM;15 reps;Left   Theraband Level (Shoulder External Rotation) --  wand    Flexion AAROM;15 reps;Left   Flexion Limitations wand; supine      Shoulder Exercises: Seated   Other Seated Exercises attempted L lats stretch with large p-ball however pt. unable to perform without pain in shoulder thus terminated      Shoulder Exercises: Standing   Flexion AAROM;10 reps   Flexion Limitations AAROM on finger ladder    ABduction AAROM;Left;10 reps   Shoulder ABduction Weight (lbs) Scaption; finger ladder      Shoulder Exercises: Pulleys   Flexion 3 minutes   ABduction 3 minutes     Manual Therapy   Manual Therapy Soft tissue mobilization;Myofascial release   Manual therapy comments seated    Soft tissue mobilization STM to L lats over area of most tenderness    Myofascial Release TPR to L lats over area of most tenderness; limited tolerance                  PT Short Term Goals - 01/05/17 1832      PT SHORT TERM GOAL #1  Title pt will feel more steady when getting out of bed due to increased strength and body awareness   Time 4   Period Weeks   Status Achieved  Feels steadier, but sleeping ON it is still painful     PT SHORT TERM GOAL #2   Title independent with initial HEP   Time 4   Period Weeks   Status Achieved     PT SHORT TERM GOAL #5   Title pain reduced by 25%   Time 4   Period Weeks   Status Achieved           PT Long Term Goals - 01/12/17 1640      PT LONG TERM GOAL #1   Title patient able to get dressed by herself due to improved strength and ROM   Baseline daughter helps   Time 12   Period Weeks   Status On-going     PT LONG TERM GOAL #2   Title Pt able to perform overhead activities such as putting things in her cabinets due to improved strength and AROM   Time 12   Period Weeks   Status On-going     PT LONG TERM GOAL #3   Title independent with advanced HEP   Time 12   Period Weeks    Status On-going     PT LONG TERM GOAL #4   Title FOTO < or = to 44% limited   Time 12   Period Weeks   Status On-going     PT LONG TERM GOAL #5   Title pain reduced by 60% or more   Baseline 8/10 pain at worst   Time 12   Period Weeks   Status On-going  8.15.18: 5/10 pain at worst.  25% reduction in pain                Plan - 01/12/17 1627    Clinical Impression Statement Pt. doing well today however noting soreness for a few days after last treatment which disturbed sleep.  Pt. still with limited tolerance for AAROM activities on wall ladder today.  Notable TP's in L Latissimus Dorsi today thus instruction on stretching and tennis self-ball release on wall.  Pt. noting ~ 25% reduction in overall pain levels at this point.     PT Treatment/Interventions ADLs/Self Care Home Management;Biofeedback;Cryotherapy;Electrical Stimulation;Iontophoresis 4mg /ml Dexamethasone;Moist Heat;Ultrasound;Functional mobility training;Therapeutic activities;Therapeutic exercise;Neuromuscular re-education;Passive range of motion;Patient/family education;Dry needling;Taping;Manual techniques   PT Next Visit Plan Progress into AAROM/AROM as pt. able; monitor tolerance to updated HEP, ROM exercises and PROM      Patient will benefit from skilled therapeutic intervention in order to improve the following deficits and impairments:  Decreased activity tolerance, Decreased mobility, Decreased range of motion, Decreased strength, Impaired UE functional use, Pain, Increased muscle spasms  Visit Diagnosis: Acute pain of left shoulder  Muscle weakness (generalized)  Abnormal posture     Problem List Patient Active Problem List   Diagnosis Date Noted  . Anterior dislocation of left shoulder 12/20/2016  . Closed fracture of proximal end of left humerus with routine healing 12/20/2016  . Dyspnea on exertion 07/27/2016  . RBBB 03/04/2016  . OSA on CPAP 03/04/2016  . ILD (interstitial lung disease)  (HCC)  c/w NSIP p macrodantin exp 02/11/2016  . Morbid obesity due to excess calories (HCC) 02/11/2016  . Chronic respiratory failure with hypoxia (HCC) 01/07/2016  . Upper airway cough syndrome 12/06/2015  . COPD GOLD 0 12/05/2015  . Acute cholecystitis 07/19/2014  Kermit BaloMicah Jontay Maston, PTA 01/12/17 5:14 PM  [facility] [address], [address] [place], [place], [address] Phone: [phone]   Fax:  [phone]  Name: Lindsay Brady MRN: [id number] Date of Birth: 1950/10/16

## 2017-01-12 NOTE — Telephone Encounter (Signed)
Can you advise? 

## 2017-01-12 NOTE — Telephone Encounter (Signed)
Patient called asking for a refill on pain medication. CB # [phone]

## 2017-01-12 NOTE — Telephone Encounter (Signed)
IC and spoke to daughter and she asks if patient can have a few pills, she only takes 1-2 a day and needs them to take as needed if you would approve.

## 2017-01-18 ENCOUNTER — Encounter (INDEPENDENT_AMBULATORY_CARE_PROVIDER_SITE_OTHER): Payer: Self-pay | Admitting: Orthopedic Surgery

## 2017-01-18 ENCOUNTER — Ambulatory Visit (INDEPENDENT_AMBULATORY_CARE_PROVIDER_SITE_OTHER): Payer: Medicare Other

## 2017-01-18 ENCOUNTER — Ambulatory Visit (INDEPENDENT_AMBULATORY_CARE_PROVIDER_SITE_OTHER): Payer: Medicare Other | Admitting: Orthopedic Surgery

## 2017-01-18 VITALS — Ht 65.0 in | Wt 259.0 lb

## 2017-01-18 DIAGNOSIS — S43015D Anterior dislocation of left humerus, subsequent encounter: Secondary | ICD-10-CM

## 2017-01-18 DIAGNOSIS — S42295D Other nondisplaced fracture of upper end of left humerus, subsequent encounter for fracture with routine healing: Secondary | ICD-10-CM

## 2017-01-18 NOTE — Progress Notes (Signed)
Office Visit Note   Patient: Lindsay Brady           Date of Birth: [birth year]           MRN: [id number] Visit Date: 01/18/2017              Requested by: Tegeler, Smith Mince, MD No address on file PCP: Tegeler, Smith Mince, MD  Chief Complaint  Patient presents with  . Left Shoulder - Follow-up      HPI: Patient presents in follow-up status post close reduction for dislocation of her left shoulder. She has been to physical therapy she is going 1 time a week she states she still has pain in the shoulder she is been taking ibuprofen 800 mg at a time for the shoulder symptoms. Patient has not started an exercise program.  Assessment & Plan: Visit Diagnoses:  1. Other closed nondisplaced fracture of proximal end of left humerus with routine healing, subsequent encounter   2. Anterior dislocation of left shoulder, subsequent encounter     Plan: Recommended that she go to therapy and has been show her internal and external rotation strengthening as well as scapular stabilization strengthening with exercise bands. She'll continue working on walking her fingers up the wall for range of motion. She'll continue with her physical therapy. Discussed decreasing the ibuprofen if she develops GI symptoms.  Follow-Up Instructions: Return in about 3 weeks (around 02/08/2017).   Ortho Exam  Patient is alert, oriented, no adenopathy, well-dressed, normal affect, normal respiratory effort. Examination patient has flexion and abduction of only 70 passively I can get her to 90. She has internal/external rotation of about 30.  Imaging: Xr Shoulder Left  Result Date: 01/18/2017 Three-view radiographs of the left shoulder shows inferior subluxation of the humeral head within the glenoid. The greater tuberosity fracture is well aligned axial view shows a congruent joint.  No images are attached to the encounter.  Labs: Lab Results  Component Value Date   ESRSEDRATE 34 (H) 09/08/2016   ESRSEDRATE 31 (H) 07/27/2016   ESRSEDRATE 26 06/15/2016   CRP 2.4 01/07/2016    Orders:  Orders Placed This Encounter  Procedures  . XR Shoulder Left   No orders of the defined types were placed in this encounter.    Procedures: No procedures performed  Clinical Data: No additional findings.  ROS:  All other systems negative, except as noted in the HPI. Review of Systems  Objective: Vital Signs: Ht 5\' 5"  (1.651 m)   Wt 259 lb (117.5 kg)   BMI 43.10 kg/m   Specialty Comments:  No specialty comments available.  PMFS History: Patient Active Problem List   Diagnosis Date Noted  . Anterior dislocation of left shoulder 12/20/2016  . Closed fracture of proximal end of left humerus with routine healing 12/20/2016  . Dyspnea on exertion 07/27/2016  . RBBB 03/04/2016  . OSA on CPAP 03/04/2016  . ILD (interstitial lung disease) (HCC)  c/w NSIP p macrodantin exp 02/11/2016  . Morbid obesity due to excess calories (HCC) 02/11/2016  . Chronic respiratory failure with hypoxia (HCC) 01/07/2016  . Upper airway cough syndrome 12/06/2015  . COPD GOLD 0 12/05/2015  . Acute cholecystitis 07/19/2014   Past Medical History:  Diagnosis Date  . Anxiety   . Arthritis   . Cholecystitis 07/2014  . COPD (chronic obstructive pulmonary disease) (HCC)   . Diabetes mellitus without complication (HCC)    type 2   . Fracture 2013 ?   left leg  .  GERD (gastroesophageal reflux disease)    hx of gerd  . Headache   . Hx: UTI (urinary tract infection)   . OA (osteoarthritis) of knee   . Pneumonia    hx of viral pna years ago  . Shortness of breath dyspnea   . Sleep apnea    uses cpap  . Urgency of urination     Family History  Problem Relation Age of Onset  . Stroke Mother   . Lung cancer Father        smoked  . Colon cancer Father   . Cancer Paternal Grandmother   . Cancer Paternal Grandfather     Past Surgical History:  Procedure Laterality Date  . ABDOMINAL HYSTERECTOMY      . CARPAL TUNNEL RELEASE    . CHOLECYSTECTOMY N/A 07/19/2014   Procedure: LAPAROSCOPIC CHOLECYSTECTOMY WITH INTRAOPERATIVE CHOLANGIOGRAM;  Surgeon: Violeta Gelinas, MD;  Location: [facility];  Service: General;  Laterality: N/A;  . COLONOSCOPY  ?   2011  . KNEE ARTHROSCOPY    . TONSILLECTOMY     Social History   Occupational History  . Not on file.   Social History Main Topics  . Smoking status: Former Smoker    Packs/day: 2.00    Years: 30.00    Types: Cigarettes    Quit date: 05/31/1996  . Smokeless tobacco: Never Used  . Alcohol use No     Comment: rare  . Drug use: No  . Sexual activity: Not on file

## 2017-01-26 ENCOUNTER — Ambulatory Visit: Payer: Medicare Other

## 2017-01-26 DIAGNOSIS — M6281 Muscle weakness (generalized): Secondary | ICD-10-CM

## 2017-01-26 DIAGNOSIS — M25512 Pain in left shoulder: Secondary | ICD-10-CM | POA: Diagnosis not present

## 2017-01-26 DIAGNOSIS — R293 Abnormal posture: Secondary | ICD-10-CM

## 2017-01-26 NOTE — Therapy (Signed)
[facility] [address], [address] [place], [place], [address] Phone: [phone]   Fax:  [phone]  Physical Therapy Treatment  Patient Details  Name: Lindsay Brady MRN: [id number] Date of Birth: 04-25-1951 Referring Provider: Aldean Baker   Encounter Date: 01/26/2017      PT End of Session - 01/26/17 1659    Visit Number 7   Date for PT Re-Evaluation 02/01/17   PT Start Time 1617   PT Stop Time 1713   PT Time Calculation (min) 56 min   Activity Tolerance Patient tolerated treatment well   Behavior During Therapy [facility] for tasks assessed/performed      Past Medical History:  Diagnosis Date  . Anxiety   . Arthritis   . Cholecystitis 07/2014  . COPD (chronic obstructive pulmonary disease) (HCC)   . Diabetes mellitus without complication (HCC)    type 2   . Fracture 2013 ?   left leg  . GERD (gastroesophageal reflux disease)    hx of gerd  . Headache   . Hx: UTI (urinary tract infection)   . OA (osteoarthritis) of knee   . Pneumonia    hx of viral pna years ago  . Shortness of breath dyspnea   . Sleep apnea    uses cpap  . Urgency of urination     Past Surgical History:  Procedure Laterality Date  . ABDOMINAL HYSTERECTOMY    . CARPAL TUNNEL RELEASE    . CHOLECYSTECTOMY N/A 07/19/2014   Procedure: LAPAROSCOPIC CHOLECYSTECTOMY WITH INTRAOPERATIVE CHOLANGIOGRAM;  Surgeon: Violeta Gelinas, MD;  Location: [facility] OR;  Service: General;  Laterality: N/A;  . COLONOSCOPY  ?   2011  . KNEE ARTHROSCOPY    . TONSILLECTOMY      There were no vitals filed for this visit.      Subjective Assessment - 01/26/17 1625    Subjective Lindsay Brady doing well today missed last appointment due to taking daughter to hospital.   Patient Stated Goals have it back before it started   Currently in Pain? Yes   Pain Score 1    Pain Location Shoulder   Pain Orientation Left   Pain Descriptors / Indicators Grimacing   Pain Type Acute pain    Pain Radiating Towards none today   Pain Onset More than a month ago   Pain Frequency Intermittent   Aggravating Factors  reaching    Pain Relieving Factors rest    Effect of Pain on Daily Activities limits overhead activities, can't sleep on L side   Multiple Pain Sites No                         [facility] Adult PT Treatment/Exercise - 01/26/17 1703      Shoulder Exercises: Standing   External Rotation 15 reps;Theraband;Left  towel roll    Theraband Level (Shoulder External Rotation) Level 1 (Yellow)   Internal Rotation 15 reps;Theraband;Left  2 sets    Theraband Level (Shoulder Internal Rotation) Level 1 (Yellow)   Internal Rotation Limitations towel roll    Flexion AAROM;10 reps   Flexion Limitations wall slides    ABduction AAROM;Left;10 reps   Shoulder ABduction Weight (lbs) Scaption; wall slides     Extension Both;15 reps;Theraband   Theraband Level (Shoulder Extension) Level 2 (Red)   Extension Limitations tactile cues for scapular squeeze    Row Both;15 reps;Theraband   Theraband Level (Shoulder Row) Level 3 (Green)   Row Limitations tactile cues  for scapular squeeze      Shoulder Exercises: Pulleys   Flexion 3 minutes   ABduction 3 minutes  scaptoin      Shoulder Exercises: ROM/Strengthening   Wall Pushups 15 reps   Pushups Limitations mild pressure     Modalities   Modalities Electrical Stimulation     Electrical Stimulation   Electrical Stimulation Location L shoulder complex    Electrical Stimulation Action IFC   Electrical Stimulation Parameters 80-150Hz , intensity to pt. tolerance, 15'    Electrical Stimulation Goals Pain                PT Education - 01/26/17 1659    Education provided Yes   Education Details row with red TB issued to pt., extension with red TB, ER/IR with yellow TB issued to pt.   Person(s) Educated Patient   Methods Explanation;Demonstration;Verbal cues;Handout   Comprehension Verbalized  understanding;Returned demonstration;Verbal cues required;Need further instruction          PT Short Term Goals - 01/05/17 1832      PT SHORT TERM GOAL #1   Title pt will feel more steady when getting out of bed due to increased strength and body awareness   Time 4   Period Weeks   Status Achieved  Feels steadier, but sleeping ON it is still painful     PT SHORT TERM GOAL #2   Title independent with initial HEP   Time 4   Period Weeks   Status Achieved     PT SHORT TERM GOAL #5   Title pain reduced by 25%   Time 4   Period Weeks   Status Achieved           PT Long Term Goals - 01/12/17 1640      PT LONG TERM GOAL #1   Title patient able to get dressed by herself due to improved strength and ROM   Baseline daughter helps   Time 12   Period Weeks   Status On-going     PT LONG TERM GOAL #2   Title Pt able to perform overhead activities such as putting things in her cabinets due to improved strength and AROM   Time 12   Period Weeks   Status On-going     PT LONG TERM GOAL #3   Title independent with advanced HEP   Time 12   Period Weeks   Status On-going     PT LONG TERM GOAL #4   Title FOTO < or = to 44% limited   Time 12   Period Weeks   Status On-going     PT LONG TERM GOAL #5   Title pain reduced by 60% or more   Baseline 8/10 pain at worst   Time 12   Period Weeks   Status On-going  8.15.18: 5/10 pain at worst.  25% reduction in pain                Plan - 01/26/17 1702    Clinical Impression Statement Pt. returning to therapy following two-week break.  Doing well noting she missed last appointment due to taking daughter to hospital.  Saw MD ~ 1 wk ago with MD wanting continued therapy and RTC strengthening.  Yellow TB IR, ER initiated today as well was band rows for scapular strengthening.  Pt. tolerated all activities well and able to demo good overall technique with min cueing thus HEP updated with these activities. Will plan to check  for tolerance  and modify PRN in upcoming visit per pt. report.  Treatment ending with E-stim due to pt. report of some discomfort following therex.  Pt. seems to be progressing well.   PT Treatment/Interventions ADLs/Self Care Home Management;Biofeedback;Cryotherapy;Electrical Stimulation;Iontophoresis 4mg /ml Dexamethasone;Moist Heat;Ultrasound;Functional mobility training;Therapeutic activities;Therapeutic exercise;Neuromuscular re-education;Passive range of motion;Patient/family education;Dry needling;Taping;Manual techniques   PT Next Visit Plan CHECK tolerance to updated HEP; progress RTC strengthening as able; wall slides, pulleys etc to maintain ROM; scapular strengthening       Patient will benefit from skilled therapeutic intervention in order to improve the following deficits and impairments:  Decreased activity tolerance, Decreased mobility, Decreased range of motion, Decreased strength, Impaired UE functional use, Pain, Increased muscle spasms  Visit Diagnosis: Acute pain of left shoulder  Muscle weakness (generalized)  Abnormal posture     Problem List Patient Active Problem List   Diagnosis Date Noted  . Anterior dislocation of left shoulder 12/20/2016  . Closed fracture of proximal end of left humerus with routine healing 12/20/2016  . Dyspnea on exertion 07/27/2016  . RBBB 03/04/2016  . OSA on CPAP 03/04/2016  . ILD (interstitial lung disease) (HCC)  c/w NSIP p macrodantin exp 02/11/2016  . Morbid obesity due to excess calories (HCC) 02/11/2016  . Chronic respiratory failure with hypoxia (HCC) 01/07/2016  . Upper airway cough syndrome 12/06/2015  . COPD GOLD 0 12/05/2015  . Acute cholecystitis 07/19/2014    Kermit Balo, PTA 01/26/17 5:18 PM  [facility] [address], [address] [place], [place], [address] Phone: [phone]   Fax:  [phone]  Name: Lindsay Brady MRN: [id number] Date of Birth:  1951-03-26

## 2017-02-01 ENCOUNTER — Ambulatory Visit: Payer: Medicare Other | Attending: Orthopedic Surgery | Admitting: Physical Therapy

## 2017-02-01 DIAGNOSIS — M25512 Pain in left shoulder: Secondary | ICD-10-CM | POA: Diagnosis not present

## 2017-02-01 DIAGNOSIS — R293 Abnormal posture: Secondary | ICD-10-CM | POA: Diagnosis present

## 2017-02-01 DIAGNOSIS — M6281 Muscle weakness (generalized): Secondary | ICD-10-CM | POA: Insufficient documentation

## 2017-02-01 NOTE — Therapy (Signed)
[facility] [address], [place], [place], [address] Phone: [phone]   Fax:  [phone]  Physical Therapy Treatment/Re-evaluation  Patient Details  Name: Lindsay Brady MRN: [id number] Date of Birth: 03-06-1951 Referring Provider: Meridee Score, MD   Encounter Date: 02/01/2017      PT End of Session - 02/01/17 1705    Visit Number 8   Number of Visits 17   Date for PT Re-Evaluation 03/30/17   Authorization Type Medicare A and B (G-codes updated on 29-Jul-2022 visit   Authorization Time Period 02/02/17 to 03/30/17   Authorization - Visit Number 1   Authorization - Number of Visits 10   PT Start Time 2585   PT Stop Time 1657   PT Time Calculation (min) 39 min   Activity Tolerance Patient tolerated treatment well;No increased pain   Behavior During Therapy WFL for tasks assessed/performed      Past Medical History:  Diagnosis Date  . Anxiety   . Arthritis   . Cholecystitis 07/2014  . COPD (chronic obstructive pulmonary disease) ([place])   . Diabetes mellitus without complication ([place])    type 2   . Fracture 2013 ?   left leg  . GERD (gastroesophageal reflux disease)    hx of gerd  . Headache   . Hx: UTI (urinary tract infection)   . OA (osteoarthritis) of knee   . Pneumonia    hx of viral pna years ago  . Shortness of breath dyspnea   . Sleep apnea    uses cpap  . Urgency of urination     Past Surgical History:  Procedure Laterality Date  . ABDOMINAL HYSTERECTOMY    . CARPAL TUNNEL RELEASE    . CHOLECYSTECTOMY N/A 07/19/2014   Procedure: LAPAROSCOPIC CHOLECYSTECTOMY WITH INTRAOPERATIVE CHOLANGIOGRAM;  Surgeon: Georganna Skeans, MD;  Location: [facility];  Service: General;  Laterality: N/A;  . COLONOSCOPY  ?   2011  . KNEE ARTHROSCOPY    . TONSILLECTOMY      There were no vitals filed for this visit.      Subjective Assessment - 02/01/17 1619    Subjective Pt reports that things are definitely improving but she  agrees that she has a long way to go. She is trying to do more things with her Lt arm. She is still not able to put on her bra and fix her hair without difficulty. She still has days where her shoulder is sore, but this is mostly muscle soreness.    Limitations Other (comment)  dressing, fixing her hair    Patient Stated Goals have it back before it started   Currently in Pain? No/denies   Pain Onset More than a month ago            [facility] PT Assessment - 02/01/17 0001      Assessment   Medical Diagnosis S43.015A (ICD-10-CM) - Closed anterior dislocation of left shoulder, initial encounter   Referring Provider Meridee Score, MD    Onset Date/Surgical Date 10/27/16   Next MD Visit 2 weeks   Prior Therapy No     Precautions   Precautions Shoulder   Type of Shoulder Precautions guided by pain; humeral head fracture and history of dislocation     Restrictions   Weight Bearing Restrictions No     Balance Screen   Has the patient fallen in the past 6 months Yes   How many times? 1  none since start of PT  Has the patient had a decrease in activity level because of a fear of falling?  No   Is the patient reluctant to leave their home because of a fear of falling?  No     Home Environment   Living Environment Private residence   Living Arrangements Children     Prior Function   Level of Independence Needs assistance with ADLs;Needs assistance with homemaking   Vocation Retired     Associate Professor   Overall Cognitive Status Within Functional Limits for tasks assessed     Observation/Other Assessments   Focus on Therapeutic Outcomes (FOTO)  --     Posture/Postural Control   Posture/Postural Control Postural limitations   Postural Limitations Rounded Shoulders     ROM / Strength   AROM / PROM / Strength PROM     AROM   Left Shoulder Flexion 70 Degrees   Left Shoulder ABduction 80 Degrees   Left Shoulder Internal Rotation 55 Degrees   Left Shoulder External Rotation 55 Degrees      PROM   PROM Assessment Site Shoulder   Right/Left Shoulder Left   Left Shoulder Flexion 137 Degrees   Left Shoulder ABduction 110 Degrees   Left Shoulder Internal Rotation 65 Degrees  at 90 deg abduction    Left Shoulder External Rotation 25 Degrees  at 90 deg abduction      Strength   Right/Left Shoulder Left   Left Shoulder Flexion 4/5   Left Shoulder ABduction 3-/5  Pain with resistance    Left Shoulder Internal Rotation 4-/5   Left Shoulder External Rotation 3+/5     Palpation   Palpation comment Lt posterior shoulder infraspinatus and upper trap region      Ambulation/Gait   Gait Pattern Within Functional Limits                     [facility] Adult PT Treatment/Exercise - 02/01/17 0001      Shoulder Exercises: Seated   Other Seated Exercises Lt shoulder ER with yellow TB, RLE adjusting resistance to allow for full ROM x10 reps    Other Seated Exercises seated low rows with yellow TB for technique review x5 reps                 PT Education - 02/01/17 1704    Education provided Yes   Education Details reviewed goals and progress made since start of evaluation; importance of completing HEP to address limitations in strength and ROM; reviewed technique with several exercises and updated pt's HEP    Person(s) Educated Patient   Methods Explanation;Verbal cues;Handout   Comprehension Returned demonstration;Verbalized understanding          PT Short Term Goals - 02/01/17 1638      PT SHORT TERM GOAL #1   Title pt will feel more steady when getting out of bed due to increased strength and body awareness   Time 4   Period Weeks   Status Achieved     PT SHORT TERM GOAL #2   Title independent with initial HEP   Baseline 30% consistent with HEP    Time 4   Period Weeks   Status Partially Met     PT SHORT TERM GOAL #3   Title improved PROM to 120 deg flexion and 90 deg external rotation of left shoulder   Baseline only about 25 deg at 90 deg  abduction   Time 4   Period Weeks   Status Partially Met  PT SHORT TERM GOAL #4   Title left shoulder strength improved to be able to peform AROM flexion and abduction to 90 degrees for improved functional lifting activities   Baseline 80 deg    Time 4   Period Weeks   Status Partially Met     PT SHORT TERM GOAL #5   Title pain reduced by 25%   Baseline 80% improvement in pain    Time 4   Period Weeks   Status Achieved           PT Long Term Goals - 02/01/17 1641      PT LONG TERM GOAL #1   Title patient able to get dressed by herself due to improved strength and ROM   Baseline pt feels this is improved, but she is still unable to fasten her bra   Time 8   Period Weeks   Status Partially Met     PT LONG TERM GOAL #2   Title Pt able to perform overhead activities such as putting things in her cabinets due to improved strength and AROM   Baseline unable with Lt    Time 8   Period Weeks   Status Not Met     PT LONG TERM GOAL #3   Title independent with advanced HEP   Time 8   Period Weeks   Status On-going     PT LONG TERM GOAL #4   Title FOTO < or = to 44% limited   Time 8   Period Weeks   Status On-going     PT LONG TERM GOAL #5   Title pain reduced by 60% or more   Baseline pain and symptoms improved 80%   Time 8   Period Weeks   Status Achieved  8.15.18: 5/10 pain at worst.  25% reduction in pain                Plan - 02/01/17 1650    Clinical Impression Statement Pt was re-evaluated this session having met most of her short term goals and making progress towards all others since the start of therapy. She demonstrates improvements in active Lt shoulder ROM, as well as improved shoulder strength in all directions. She also reports a significant improvement in her pain and shoulder symptoms since the start of her therapy. Currently, although she has made progress, she would still greatly benefit from skilled PT to address her limitations in  shoulder active and passive ROM, improver her Lt shoulder strength and increase her endurance, which will allow her to complete ADLs without assistance or difficulty and decrease her risk of another dislocation. Therapist reviewed goals and educated the pt on the importance of completing her HEP in order to improve her UE function. She verbalized understanding of this.    History and Personal Factors relevant to plan of care: n/a   Clinical Presentation Stable   Clinical Presentation due to: improving    Clinical Decision Making Low   Rehab Potential Excellent   PT Frequency 2x / week   PT Duration 8 weeks   PT Treatment/Interventions ADLs/Self Care Home Management;Biofeedback;Cryotherapy;Electrical Stimulation;Iontophoresis 73m/ml Dexamethasone;Moist Heat;Ultrasound;Functional mobility training;Therapeutic activities;Therapeutic exercise;Neuromuscular re-education;Passive range of motion;Patient/family education;Dry needling;Taping;Manual techniques   PT Next Visit Plan update HEP if necessary; focus on AAROM as well as RTC activation; STM along upper trap/posterior shoulder as needed   Consulted and Agree with Plan of Care Patient      Patient will benefit from skilled therapeutic intervention in order  to improve the following deficits and impairments:  Decreased activity tolerance, Decreased mobility, Decreased range of motion, Decreased strength, Impaired UE functional use, Pain, Increased muscle spasms, Impaired flexibility  Visit Diagnosis: Acute pain of left shoulder  Muscle weakness (generalized)  Abnormal posture       G-Codes - 02-19-2017 1648    Functional Assessment Tool Used (Outpatient Only) clinical impression based on assessment of ROM, strength and UE functional use    Functional Limitation Carrying, moving and handling objects   Carrying, Moving and Handling Objects Current Status (I1658) At least 60 percent but less than 80 percent impaired, limited or restricted    Carrying, Moving and Handling Objects Goal Status (K0634) At least 40 percent but less than 60 percent impaired, limited or restricted      Problem List Patient Active Problem List   Diagnosis Date Noted  . Anterior dislocation of left shoulder 12/20/2016  . Closed fracture of proximal end of left humerus with routine healing 12/20/2016  . Dyspnea on exertion 07/27/2016  . RBBB 03/04/2016  . OSA on CPAP 03/04/2016  . ILD (interstitial lung disease) ([place])  c/w NSIP p macrodantin exp 02/11/2016  . Morbid obesity due to excess calories ([place]) 02/11/2016  . Chronic respiratory failure with hypoxia ([place]) 01/07/2016  . Upper airway cough syndrome 12/06/2015  . COPD GOLD 0 12/05/2015  . Acute cholecystitis 07/19/2014    Elly Modena PT, DPT 2017-02-19, 5:15 PM  [facility] [address] [place], [place], [address] Phone: [phone]   Fax:  [phone]  Name: Candela Krul MRN: [id number] Date of Birth: Dec 13, 1950

## 2017-02-08 ENCOUNTER — Ambulatory Visit (INDEPENDENT_AMBULATORY_CARE_PROVIDER_SITE_OTHER): Payer: Medicare Other | Admitting: Orthopedic Surgery

## 2017-02-08 DIAGNOSIS — S42295D Other nondisplaced fracture of upper end of left humerus, subsequent encounter for fracture with routine healing: Secondary | ICD-10-CM | POA: Diagnosis not present

## 2017-02-08 NOTE — Progress Notes (Signed)
Office Visit Note   Patient: Lindsay Brady           Date of Birth: [birth year]           MRN: [id number] Visit Date: 02/08/2017              Requested by: Tegeler, Smith Mince, MD No address on file PCP: Tegeler, Smith Mince, MD  No chief complaint on file.     HPI: Patient is a 66 year old woman who presents status post close reduction left shoulder dislocation with proximal fracture. Patient is been going to physical therapy she states she will start 2 times a week. She feels like she is showing slow improvement in her range of motion.  Assessment & Plan: Visit Diagnoses:  1. Other closed nondisplaced fracture of proximal end of left humerus with routine healing, subsequent encounter     Plan: Recommended he continue physical therapy and she will need to continue with her exercises for at least a year. Patient will follow-up in 4 weeks for reevaluation.  Follow-Up Instructions: Return in about 4 weeks (around 03/08/2017).   Ortho Exam  Patient is alert, oriented, no adenopathy, well-dressed, normal affect, normal respiratory effort. Patient has a adductor lurch on the left. Patient has glenohumeral motion of 70 with external rotation of 45 internal rotation of 30. Patient can get her hand to the top of her head.  Imaging: No results found. No images are attached to the encounter.  Labs: Lab Results  Component Value Date   ESRSEDRATE 34 (H) 09/08/2016   ESRSEDRATE 31 (H) 07/27/2016   ESRSEDRATE 26 06/15/2016   CRP 2.4 01/07/2016    Orders:  No orders of the defined types were placed in this encounter.  No orders of the defined types were placed in this encounter.    Procedures: No procedures performed  Clinical Data: No additional findings.  ROS:  All other systems negative, except as noted in the HPI. Review of Systems  Objective: Vital Signs: There were no vitals taken for this visit.  Specialty Comments:  No specialty comments  available.  PMFS History: Patient Active Problem List   Diagnosis Date Noted  . Anterior dislocation of left shoulder 12/20/2016  . Closed fracture of proximal end of left humerus with routine healing 12/20/2016  . Dyspnea on exertion 07/27/2016  . RBBB 03/04/2016  . OSA on CPAP 03/04/2016  . ILD (interstitial lung disease) (HCC)  c/w NSIP p macrodantin exp 02/11/2016  . Morbid obesity due to excess calories (HCC) 02/11/2016  . Chronic respiratory failure with hypoxia (HCC) 01/07/2016  . Upper airway cough syndrome 12/06/2015  . COPD GOLD 0 12/05/2015  . Acute cholecystitis 07/19/2014   Past Medical History:  Diagnosis Date  . Anxiety   . Arthritis   . Cholecystitis 07/2014  . COPD (chronic obstructive pulmonary disease) (HCC)   . Diabetes mellitus without complication (HCC)    type 2   . Fracture 2013 ?   left leg  . GERD (gastroesophageal reflux disease)    hx of gerd  . Headache   . Hx: UTI (urinary tract infection)   . OA (osteoarthritis) of knee   . Pneumonia    hx of viral pna years ago  . Shortness of breath dyspnea   . Sleep apnea    uses cpap  . Urgency of urination     Family History  Problem Relation Age of Onset  . Stroke Mother   . Lung cancer Father  smoked  . Colon cancer Father   . Cancer Paternal Grandmother   . Cancer Paternal Grandfather     Past Surgical History:  Procedure Laterality Date  . ABDOMINAL HYSTERECTOMY    . CARPAL TUNNEL RELEASE    . CHOLECYSTECTOMY N/A 07/19/2014   Procedure: LAPAROSCOPIC CHOLECYSTECTOMY WITH INTRAOPERATIVE CHOLANGIOGRAM;  Surgeon: Violeta GelinasBurke Thompson, MD;  [facility];  Service: General;  Laterality: N/A;  . COLONOSCOPY  ?   2011  . KNEE ARTHROSCOPY    . TONSILLECTOMY     Social History   Occupational History  . Not on file.   Social History Main Topics  . Smoking status: Former Smoker    Packs/day: 2.00    Years: 30.00    Types: Cigarettes    Quit date: 05/31/1996  . Smokeless tobacco: Never  Used  . Alcohol use No     Comment: rare  . Drug use: No  . Sexual activity: Not on file

## 2017-02-09 ENCOUNTER — Ambulatory Visit: Payer: Medicare Other

## 2017-02-09 DIAGNOSIS — M6281 Muscle weakness (generalized): Secondary | ICD-10-CM

## 2017-02-09 DIAGNOSIS — R293 Abnormal posture: Secondary | ICD-10-CM

## 2017-02-09 DIAGNOSIS — M25512 Pain in left shoulder: Secondary | ICD-10-CM | POA: Diagnosis not present

## 2017-02-09 NOTE — Therapy (Signed)
[facility] [address], [place], [place], [address] Phone: [phone]   Fax:  [phone]  Physical Therapy Treatment  Patient Details  Name: [place] MRN: [id number] Date of Birth: 1951-01-25 Referring Provider: Meridee Score, MD   Encounter Date: 02/09/2017      PT End of Session - 02/09/17 1551    Visit Number 9   Number of Visits 17   Date for PT Re-Evaluation 03/30/17   Authorization Type Medicare A and B (G-codes updated on 2022/07/14 visit   Authorization Time Period 02/02/17 to 03/30/17   Authorization - Visit Number 2   Authorization - Number of Visits 10   PT Start Time 1534   PT Stop Time 1615   PT Time Calculation (min) 41 min   Activity Tolerance Patient tolerated treatment well;No increased pain   Behavior During Therapy WFL for tasks assessed/performed      Past Medical History:  Diagnosis Date  . Anxiety   . Arthritis   . Cholecystitis 07/2014  . COPD (chronic obstructive pulmonary disease) ([place])   . Diabetes mellitus without complication ([place])    type 2   . Fracture 2013 ?   left leg  . GERD (gastroesophageal reflux disease)    hx of gerd  . Headache   . Hx: UTI (urinary tract infection)   . OA (osteoarthritis) of knee   . Pneumonia    hx of viral pna years ago  . Shortness of breath dyspnea   . Sleep apnea    uses cpap  . Urgency of urination     Past Surgical History:  Procedure Laterality Date  . ABDOMINAL HYSTERECTOMY    . CARPAL TUNNEL RELEASE    . CHOLECYSTECTOMY N/A 07/19/2014   Procedure: LAPAROSCOPIC CHOLECYSTECTOMY WITH INTRAOPERATIVE CHOLANGIOGRAM;  Surgeon: Georganna Skeans, MD;  Location: [place];  Service: General;  Laterality: N/A;  . COLONOSCOPY  ?   2011  . KNEE ARTHROSCOPY    . TONSILLECTOMY      There were no vitals filed for this visit.      Subjective Assessment - 02/09/17 1542    Subjective Pt. noting MD f/u yesterday went well.     Patient Stated Goals  have it back before it started   Currently in Pain? No/denies   Pain Score 0-No pain   Multiple Pain Sites No            [facility] PT Assessment - 02/09/17 1552      AROM   AROM Assessment Site Shoulder   Right/Left Shoulder Left   Left Shoulder Flexion 121 Degrees   Left Shoulder ABduction 110 Degrees     PROM   PROM Assessment Site Shoulder   Right/Left Shoulder Left   Left Shoulder Flexion 148 Degrees   Left Shoulder ABduction 130 Degrees   Left Shoulder Internal Rotation 66 Degrees   Left Shoulder External Rotation 59 Degrees     Strength   Right/Left Shoulder --                     [facility] Adult PT Treatment/Exercise - 02/09/17 1605      Shoulder Exercises: Supine   Flexion AAROM;15 reps;Left   Flexion Limitations wand; supine 5" stretch overhead      Shoulder Exercises: Standing   External Rotation 15 reps   Theraband Level (Shoulder External Rotation) Level 2 (Red)   Internal Rotation 15 reps;Theraband;Left   Theraband Level (Shoulder Internal Rotation) Level 2 (  Red)   Internal Rotation Limitations towel roll    Flexion AAROM;10 reps   Flexion Limitations wall slides    Extension Both;10 reps;Theraband   Theraband Level (Shoulder Extension) Level 3 (Green)   Extension Limitations tactile cues for scapular squeeze    Row Both;15 reps;Theraband   Theraband Level (Shoulder Row) Level 3 (Green)   Row Limitations tactile cues for scapular squeeze      Shoulder Exercises: ROM/Strengthening   UBE (Upper Arm Bike) L2 x 2 min forward/2 min backwards      Manual Therapy   Manual Therapy Passive ROM   Manual therapy comments supine    Passive ROM PROM all directions with gentle stretch                   PT Short Term Goals - 02/01/17 1638      PT SHORT TERM GOAL #1   Title pt will feel more steady when getting out of bed due to increased strength and body awareness   Time 4   Period Weeks   Status Achieved     PT SHORT TERM GOAL #2    Title independent with initial HEP   Baseline 30% consistent with HEP    Time 4   Period Weeks   Status Partially Met     PT SHORT TERM GOAL #3   Title improved PROM to 120 deg flexion and 90 deg external rotation of left shoulder   Baseline only about 25 deg at 90 deg abduction   Time 4   Period Weeks   Status Partially Met     PT SHORT TERM GOAL #4   Title left shoulder strength improved to be able to peform AROM flexion and abduction to 90 degrees for improved functional lifting activities   Baseline 80 deg    Time 4   Period Weeks   Status Partially Met     PT SHORT TERM GOAL #5   Title pain reduced by 25%   Baseline 80% improvement in pain    Time 4   Period Weeks   Status Achieved           PT Long Term Goals - 02/01/17 1641      PT LONG TERM GOAL #1   Title patient able to get dressed by herself due to improved strength and ROM   Baseline pt feels this is improved, but she is still unable to fasten her bra   Time 8   Period Weeks   Status Partially Met     PT LONG TERM GOAL #2   Title Pt able to perform overhead activities such as putting things in her cabinets due to improved strength and AROM   Baseline unable with Lt    Time 8   Period Weeks   Status Not Met     PT LONG TERM GOAL #3   Title independent with advanced HEP   Time 8   Period Weeks   Status On-going     PT LONG TERM GOAL #4   Title FOTO < or = to 44% limited   Time 8   Period Weeks   Status On-going     PT LONG TERM GOAL #5   Title pain reduced by 60% or more   Baseline pain and symptoms improved 80%   Time 8   Period Weeks   Status Achieved  8.15.18: 5/10 pain at worst.  25% reduction in pain  Plan - 02/09/17 1614    Clinical Impression Statement Pt. making notable improvement in both L shoulder AROM and PROM today following warm-up and manual mobs/gentle stretching.  Pt. still with significant strength deficits in both abduction and flexion AROM as  compared to PROM.  Pt. noting she has not been able to perform HEP due to being busy over this past week.  Pt. strongly encouraged to perform HEP daily to regain strength for improved shoulder function and stability.  Will continue functional strengthening and manual to improve PROM in coming visits.   PT Treatment/Interventions ADLs/Self Care Home Management;Biofeedback;Cryotherapy;Electrical Stimulation;Iontophoresis 6m/ml Dexamethasone;Moist Heat;Ultrasound;Functional mobility training;Therapeutic activities;Therapeutic exercise;Neuromuscular re-education;Passive range of motion;Patient/family education;Dry needling;Taping;Manual techniques   PT Next Visit Plan update HEP if necessary; focus on AAROM as well as RTC activation; STM along upper trap/posterior shoulder as needed      Patient will benefit from skilled therapeutic intervention in order to improve the following deficits and impairments:  Decreased activity tolerance, Decreased mobility, Decreased range of motion, Decreased strength, Impaired UE functional use, Pain, Increased muscle spasms, Impaired flexibility  Visit Diagnosis: Acute pain of left shoulder  Muscle weakness (generalized)  Abnormal posture     Problem List Patient Active Problem List   Diagnosis Date Noted  . Anterior dislocation of left shoulder 12/20/2016  . Closed fracture of proximal end of left humerus with routine healing 12/20/2016  . Dyspnea on exertion 07/27/2016  . RBBB 03/04/2016  . OSA on CPAP 03/04/2016  . ILD (interstitial lung disease) ([place]  c/w NSIP p macrodantin exp 02/11/2016  . Morbid obesity due to excess calories ([place] 02/11/2016  . Chronic respiratory failure with hypoxia ([place] 01/07/2016  . Upper airway cough syndrome 12/06/2015  . COPD GOLD 0 12/05/2015  . Acute cholecystitis 07/19/2014   MBess Harvest PTA 02/09/17 5:30 PM  [facility] [address] [place] [address]Phone: [phone]  Fax:  [phone] Name: Lindsay NattMRN: [id number] of Birth: [birth year]

## 2017-02-14 ENCOUNTER — Ambulatory Visit: Payer: Medicare Other | Admitting: Physical Therapy

## 2017-02-14 DIAGNOSIS — R293 Abnormal posture: Secondary | ICD-10-CM

## 2017-02-14 DIAGNOSIS — M6281 Muscle weakness (generalized): Secondary | ICD-10-CM

## 2017-02-14 DIAGNOSIS — M25512 Pain in left shoulder: Secondary | ICD-10-CM

## 2017-02-14 NOTE — Therapy (Signed)
[facility] [address] [place], [place], [address] Phone: [phone]   Fax:  [phone]  Physical Therapy Treatment  Patient Details  Name: Lindsay Brady MRN: [id number] Date of Birth: 12/14/1950 Referring Provider: Meridee Score, MD   Encounter Date: 02/14/2017      PT End of Session - 02/14/17 1559    Visit Number 10   Number of Visits 17   Date for PT Re-Evaluation 03/30/17   Authorization Type Medicare A and B (G-codes updated on July 29, 2022 visit   Authorization Time Period 02/02/17 to 03/30/17   Authorization - Visit Number 3   Authorization - Number of Visits 10   PT Start Time 9767   PT Stop Time 1615   PT Time Calculation (min) 41 min   Activity Tolerance Patient tolerated treatment well;No increased pain   Behavior During Therapy WFL for tasks assessed/performed      Past Medical History:  Diagnosis Date  . Anxiety   . Arthritis   . Cholecystitis 07/2014  . COPD (chronic obstructive pulmonary disease) ([place])   . Diabetes mellitus without complication ([place])    type 2   . Fracture 2013 ?   left leg  . GERD (gastroesophageal reflux disease)    hx of gerd  . Headache   . Hx: UTI (urinary tract infection)   . OA (osteoarthritis) of knee   . Pneumonia    hx of viral pna years ago  . Shortness of breath dyspnea   . Sleep apnea    uses cpap  . Urgency of urination     Past Surgical History:  Procedure Laterality Date  . ABDOMINAL HYSTERECTOMY    . CARPAL TUNNEL RELEASE    . CHOLECYSTECTOMY N/A 07/19/2014   Procedure: LAPAROSCOPIC CHOLECYSTECTOMY WITH INTRAOPERATIVE CHOLANGIOGRAM;  Surgeon: Georganna Skeans, MD;  Location: [place];  Service: General;  Laterality: N/A;  . COLONOSCOPY  ?   2011  . KNEE ARTHROSCOPY    . TONSILLECTOMY      There were no vitals filed for this visit.      Subjective Assessment - 02/14/17 1536    Subjective Pt reports that things are going well. She has no complaints at  this time. She states she has been working on her exercises.    Patient Stated Goals have it back before it started   Currently in Pain? No/denies                         [facility] Adult PT Treatment/Exercise - 02/14/17 0001      Shoulder Exercises: Supine   External Rotation Left;15 reps;AAROM  ~45 deg abduction    Flexion 15 reps;AAROM   Flexion Limitations wand; supine 5" stretch overhead    ABduction AAROM;Left;15 reps   ABduction Limitations 5 sec hold end range      Shoulder Exercises: Seated   Retraction Both;20 reps   External Rotation Both;10 reps;AROM   Other Seated Exercises LUE ranger circles clockwise and counterclockwise x20 reps each    Other Seated Exercises shoulder rolls backwards/forwards x20 reps each      Shoulder Exercises: Standing   Internal Rotation Left;10 reps   Theraband Level (Shoulder Internal Rotation) Level 2 (Red)   Internal Rotation Limitations towel roll, x2 sets    Row Both;15 reps   Theraband Level (Shoulder Row) Level 3 (Green)   Row Limitations x2 reps    Other Standing Exercises tricep extension 2x15 reps with  red TB      Shoulder Exercises: Pulleys   Flexion 3 minutes  scaption    ABduction 3 minutes     Shoulder Exercises: Stretch   Internal Rotation Stretch 10 seconds   Internal Rotation Stretch Limitations x10 reps, LUE with towel behind back                 PT Education - 02/14/17 1559    Education provided Yes   Education Details importance of completing HEP as instructed; technique with therex   Person(s) Educated Patient   Methods Explanation;Verbal cues;Tactile cues   Comprehension Returned demonstration;Verbalized understanding          PT Short Term Goals - 02/01/17 1638      PT SHORT TERM GOAL #1   Title pt will feel more steady when getting out of bed due to increased strength and body awareness   Time 4   Period Weeks   Status Achieved     PT SHORT TERM GOAL #2   Title independent  with initial HEP   Baseline 30% consistent with HEP    Time 4   Period Weeks   Status Partially Met     PT SHORT TERM GOAL #3   Title improved PROM to 120 deg flexion and 90 deg external rotation of left shoulder   Baseline only about 25 deg at 90 deg abduction   Time 4   Period Weeks   Status Partially Met     PT SHORT TERM GOAL #4   Title left shoulder strength improved to be able to peform AROM flexion and abduction to 90 degrees for improved functional lifting activities   Baseline 80 deg    Time 4   Period Weeks   Status Partially Met     PT SHORT TERM GOAL #5   Title pain reduced by 25%   Baseline 80% improvement in pain    Time 4   Period Weeks   Status Achieved           PT Long Term Goals - 02/01/17 1641      PT LONG TERM GOAL #1   Title patient able to get dressed by herself due to improved strength and ROM   Baseline pt feels this is improved, but she is still unable to fasten her bra   Time 8   Period Weeks   Status Partially Met     PT LONG TERM GOAL #2   Title Pt able to perform overhead activities such as putting things in her cabinets due to improved strength and AROM   Baseline unable with Lt    Time 8   Period Weeks   Status Not Met     PT LONG TERM GOAL #3   Title independent with advanced HEP   Time 8   Period Weeks   Status On-going     PT LONG TERM GOAL #4   Title FOTO < or = to 44% limited   Time 8   Period Weeks   Status On-going     PT LONG TERM GOAL #5   Title pain reduced by 60% or more   Baseline pain and symptoms improved 80%   Time 8   Period Weeks   Status Achieved  8.15.18: 5/10 pain at worst.  25% reduction in pain                Plan - 02/14/17 1607    Clinical Impression Statement Pt continues  to make steady progress towards her goals with gradually improving shoulder ROM. Continued with therex to promote shoulder ROM and gentle rotator cuff strengthening for improved glenohumeral stability. Pt does  continue to demonstrate limitations in shoulder rotation and abduction primarily and therapist encouraged her to pick back up on these specific exercises provided at her reassessment. She verbalized understanding at this time.    PT Treatment/Interventions ADLs/Self Care Home Management;Biofeedback;Cryotherapy;Electrical Stimulation;Iontophoresis 65m/ml Dexamethasone;Moist Heat;Ultrasound;Functional mobility training;Therapeutic activities;Therapeutic exercise;Neuromuscular re-education;Passive range of motion;Patient/family education;Dry needling;Taping;Manual techniques   PT Next Visit Plan focus on PROM into abduction/rotation, AAROM as well as RTC activation; STM along upper trap/posterior shoulder as needed   Consulted and Agree with Plan of Care Patient      Patient will benefit from skilled therapeutic intervention in order to improve the following deficits and impairments:  Decreased activity tolerance, Decreased mobility, Decreased range of motion, Decreased strength, Impaired UE functional use, Pain, Increased muscle spasms, Impaired flexibility  Visit Diagnosis: Acute pain of left shoulder  Muscle weakness (generalized)  Abnormal posture     Problem List Patient Active Problem List   Diagnosis Date Noted  . Anterior dislocation of left shoulder 12/20/2016  . Closed fracture of proximal end of left humerus with routine healing 12/20/2016  . Dyspnea on exertion 07/27/2016  . RBBB 03/04/2016  . OSA on CPAP 03/04/2016  . ILD (interstitial lung disease) ([place]  c/w NSIP p macrodantin exp 02/11/2016  . Morbid obesity due to excess calories ([place] 02/11/2016  . Chronic respiratory failure with hypoxia ([place] 01/07/2016  . Upper airway cough syndrome 12/06/2015  . COPD GOLD 0 12/05/2015  . Acute cholecystitis 07/19/2014    4:16 PM,02/14/17 SElly ModenaPT, DPT CSumnerat [facility] [address] [place] [address]Phone: [phone]  Fax:  [phone] Name: Lindsay CarberryMRN: [id number] of Birth: [birth year]

## 2017-02-17 ENCOUNTER — Ambulatory Visit: Payer: Medicare Other | Admitting: Physical Therapy

## 2017-02-17 DIAGNOSIS — M25512 Pain in left shoulder: Secondary | ICD-10-CM | POA: Diagnosis not present

## 2017-02-17 DIAGNOSIS — R293 Abnormal posture: Secondary | ICD-10-CM

## 2017-02-17 DIAGNOSIS — M6281 Muscle weakness (generalized): Secondary | ICD-10-CM

## 2017-02-17 NOTE — Therapy (Signed)
[facility] [address], [place], [place], [address] Phone: [phone]   Fax:  [phone]  Physical Therapy Treatment  Patient Details  Name: Lindsay Brady MRN: [id number] Date of Birth: 07/10/1950 Referring Provider: Meridee Score, MD   Encounter Date: 02/17/2017      PT End of Session - 02/17/17 1646    Visit Number 11   Number of Visits 17   Date for PT Re-Evaluation 03/30/17   Authorization Type Medicare A and B (G-codes updated on 07-25-22 visit   Authorization Time Period 02/02/17 to 03/30/17   Authorization - Visit Number 4   Authorization - Number of Visits 10   PT Start Time 0109   PT Stop Time 3235   PT Time Calculation (min) 42 min   Activity Tolerance Patient tolerated treatment well;No increased pain   Behavior During Therapy WFL for tasks assessed/performed      Past Medical History:  Diagnosis Date  . Anxiety   . Arthritis   . Cholecystitis 07/2014  . COPD (chronic obstructive pulmonary disease) ([place])   . Diabetes mellitus without complication ([name])    type 2   . Fracture 2013 ?   left leg  . GERD (gastroesophageal reflux disease)    hx of gerd  . Headache   . Hx: UTI (urinary tract infection)   . OA (osteoarthritis) of knee   . Pneumonia    hx of viral pna years ago  . Shortness of breath dyspnea   . Sleep apnea    uses cpap  . Urgency of urination     Past Surgical History:  Procedure Laterality Date  . ABDOMINAL HYSTERECTOMY    . CARPAL TUNNEL RELEASE    . CHOLECYSTECTOMY N/A 07/19/2014   Procedure: LAPAROSCOPIC CHOLECYSTECTOMY WITH INTRAOPERATIVE CHOLANGIOGRAM;  Surgeon: Lindsay Skeans, MD;  Location: [name];  Service: General;  Laterality: N/A;  . COLONOSCOPY  ?   2011  . KNEE ARTHROSCOPY    . TONSILLECTOMY      There were no vitals filed for this visit.      Subjective Assessment - 02/17/17 1536    Subjective Pt reports that her shoulder is a "bit testy" this afternoon. She  did complete her towel stretch and found that it was easier with an infinity scarf.   Patient Stated Goals have it back before it started   Currently in Pain? Yes   Pain Score 1    Pain Location Shoulder   Pain Orientation Left   Pain Descriptors / Indicators Aching;Dull   Pain Type Acute pain   Pain Radiating Towards none    Pain Onset More than a month ago   Pain Frequency Intermittent   Aggravating Factors  shoulder movement    Pain Relieving Factors rest             [facility] PT Assessment - 02/17/17 0001      PROM   Left Shoulder Flexion 155 Degrees   Left Shoulder ABduction 130 Degrees                     [facility] Adult PT Treatment/Exercise - 02/17/17 0001      Shoulder Exercises: Supine   Other Supine Exercises shoulder retraction 15x3sec      Shoulder Exercises: Pulleys   Flexion 3 minutes   ABduction 3 minutes   Other Pulley Exercises x2 min pulley      Shoulder Exercises: ROM/Strengthening   Wall Pushups 15 reps  Pushups Limitations raised table, tactile cues to increase weight shift onto LUE   Other ROM/Strengthening Exercises shoulder ER/IR "wall washing" x1:20 sec first set, x1 min second set    Other ROM/Strengthening Exercises UE ranger x20 reps flexion, clockwise, counterclockwise against wall      Manual Therapy   Soft tissue mobilization STM along Lt upper trap, Lt instrapinatus and posterior deltoid    Scapular Mobilization MWM into shoulder abduction, Scapular upward rotation assist during flexion shoulder pulley x2 min.    Passive ROM PROM shoulder abduction with gentle stretch x10 reps                 PT Education - 02/17/17 1628    Education provided Yes   Education Details updated HEP; technique with therex    Person(s) Educated Patient   Methods Explanation;Verbal cues;Handout;Tactile cues   Comprehension Verbalized understanding;Returned demonstration          PT Short Term Goals - 02/01/17 1638      PT SHORT TERM  GOAL #1   Title pt will feel more steady when getting out of bed due to increased strength and body awareness   Time 4   Period Weeks   Status Achieved     PT SHORT TERM GOAL #2   Title independent with initial HEP   Baseline 30% consistent with HEP    Time 4   Period Weeks   Status Partially Met     PT SHORT TERM GOAL #3   Title improved PROM to 120 deg flexion and 90 deg external rotation of left shoulder   Baseline only about 25 deg at 90 deg abduction   Time 4   Period Weeks   Status Partially Met     PT SHORT TERM GOAL #4   Title left shoulder strength improved to be able to peform AROM flexion and abduction to 90 degrees for improved functional lifting activities   Baseline 80 deg    Time 4   Period Weeks   Status Partially Met     PT SHORT TERM GOAL #5   Title pain reduced by 25%   Baseline 80% improvement in pain    Time 4   Period Weeks   Status Achieved           PT Long Term Goals - 02/01/17 1641      PT LONG TERM GOAL #1   Title patient able to get dressed by herself due to improved strength and ROM   Baseline pt feels this is improved, but she is still unable to fasten her bra   Time 8   Period Weeks   Status Partially Met     PT LONG TERM GOAL #2   Title Pt able to perform overhead activities such as putting things in her cabinets due to improved strength and AROM   Baseline unable with Lt    Time 8   Period Weeks   Status Not Met     PT LONG TERM GOAL #3   Title independent with advanced HEP   Time 8   Period Weeks   Status On-going     PT LONG TERM GOAL #4   Title FOTO < or = to 44% limited   Time 8   Period Weeks   Status On-going     PT LONG TERM GOAL #5   Title pain reduced by 60% or more   Baseline pain and symptoms improved 80%   Time 8  Period Weeks   Status Achieved  8.15.18: 5/10 pain at worst.  25% reduction in pain                Plan - 02/17/17 1706    Clinical Impression Statement Pt reporting increased  HEP adherence since last session, and demonstrates gradual increases in shoulder flexion ROM this session. Completed therex to further promote shoulder ROM and strength, adding several new exercises to pt's HEP. She demonstrates limited Lt shoulder strength and endurance, requiring intermittent rest breaks during exercise sets. Pt reported shoulder fatigue by the end of today's session, however there was no increase in pain.    PT Treatment/Interventions ADLs/Self Care Home Management;Biofeedback;Cryotherapy;Electrical Stimulation;Iontophoresis 66m/ml Dexamethasone;Moist Heat;Ultrasound;Functional mobility training;Therapeutic activities;Therapeutic exercise;Neuromuscular re-education;Passive range of motion;Patient/family education;Dry needling;Taping;Manual techniques   PT Next Visit Plan AAROM as well as shoulder and scapular strengthening progressions   PT Home Exercise Plan added incline pushup, wall washes   Consulted and Agree with Plan of Care Patient      Patient will benefit from skilled therapeutic intervention in order to improve the following deficits and impairments:  Decreased activity tolerance, Decreased mobility, Decreased range of motion, Decreased strength, Impaired UE functional use, Pain, Increased muscle spasms, Impaired flexibility  Visit Diagnosis: Acute pain of left shoulder  Muscle weakness (generalized)  Abnormal posture     Problem List Patient Active Problem List   Diagnosis Date Noted  . Anterior dislocation of left shoulder 12/20/2016  . Closed fracture of proximal end of left humerus with routine healing 12/20/2016  . Dyspnea on exertion 07/27/2016  . RBBB 03/04/2016  . OSA on CPAP 03/04/2016  . ILD (interstitial lung disease) ([place]  c/w NSIP p macrodantin exp 02/11/2016  . Morbid obesity due to excess calories ([place] 02/11/2016  . Chronic respiratory failure with hypoxia ([place] 01/07/2016  . Upper airway cough syndrome 12/06/2015  . COPD GOLD 0  12/05/2015  . Acute cholecystitis 07/19/2014    5:12 PM,02/17/17 SElly ModenaPT, DPT CRobertsat [facility] [address] [place] [address]Phone: [phone]  Fax:  [phone] Name: JGianella ChismarMRN: [id number] of Birth: 9November 28, 1952

## 2017-02-22 ENCOUNTER — Ambulatory Visit: Payer: Medicare Other | Admitting: Physical Therapy

## 2017-02-22 DIAGNOSIS — M25512 Pain in left shoulder: Secondary | ICD-10-CM | POA: Diagnosis not present

## 2017-02-22 DIAGNOSIS — R293 Abnormal posture: Secondary | ICD-10-CM

## 2017-02-22 DIAGNOSIS — M6281 Muscle weakness (generalized): Secondary | ICD-10-CM

## 2017-02-22 NOTE — Therapy (Signed)
[facility] [address] [place], [place], [address] Phone: [phone]   Fax:  [phone]  Physical Therapy Treatment  Patient Details  Name: Lindsay Brady MRN: [id number] Date of Birth: 28-Oct-1950 Referring Provider: Meridee Score, MD   Encounter Date: 02/22/2017      PT End of Session - 02/22/17 1540    Visit Number 12   Number of Visits 17   Date for PT Re-Evaluation 03/30/17   Authorization Type Medicare A and B (G-codes updated on July 25, 2022 visit   Authorization Time Period 02/02/17 to 03/30/17   Authorization - Visit Number 5   Authorization - Number of Visits 10   PT Start Time 8016   PT Stop Time 5537   PT Time Calculation (min) 38 min   Activity Tolerance Patient tolerated treatment well;No increased pain   Behavior During Therapy WFL for tasks assessed/performed      Past Medical History:  Diagnosis Date  . Anxiety   . Arthritis   . Cholecystitis 07/2014  . COPD (chronic obstructive pulmonary disease) ([place])   . Diabetes mellitus without complication ([employer])    type 2   . Fracture 2013 ?   left leg  . GERD (gastroesophageal reflux disease)    hx of gerd  . Headache   . Hx: UTI (urinary tract infection)   . OA (osteoarthritis) of knee   . Pneumonia    hx of viral pna years ago  . Shortness of breath dyspnea   . Sleep apnea    uses cpap  . Urgency of urination     Past Surgical History:  Procedure Laterality Date  . ABDOMINAL HYSTERECTOMY    . CARPAL TUNNEL RELEASE    . CHOLECYSTECTOMY N/A 07/19/2014   Procedure: LAPAROSCOPIC CHOLECYSTECTOMY WITH INTRAOPERATIVE CHOLANGIOGRAM;  Surgeon: Georganna Skeans, MD;  Location: [facility];  Service: General;  Laterality: N/A;  . COLONOSCOPY  ?   2011  . KNEE ARTHROSCOPY    . TONSILLECTOMY      There were no vitals filed for this visit.      Subjective Assessment - 02/22/17 1537    Subjective Pt reports that things are going well. Her exercises are going well  at home. She has no issues to report, but can tell she is working her shoulder more.    Patient Stated Goals have it back before it started   Currently in Pain? No/denies  No pain rating given    Pain Onset More than a month ago                         [facility] Adult PT Treatment/Exercise - 02/22/17 0001      Shoulder Exercises: Seated   Flexion AAROM;Both;10 reps   Flexion Limitations x2 sets, using pool noodle    Other Seated Exercises LUE ranger into flexion and horizontal abd/add, seated at L 22 x10 reps     Shoulder Exercises: Standing   External Rotation 10 reps;Left;Other (comment)  x2 sets, therapist adjusting resistance for improved range    Theraband Level (Shoulder External Rotation) Level 2 (Red)   Internal Rotation Left;10 reps   Theraband Level (Shoulder Internal Rotation) Level 3 (Green)     Shoulder Exercises: Pulleys   Flexion 3 minutes   ABduction 3 minutes     Shoulder Exercises: ROM/Strengthening   Wall Pushups 15 reps   Pushups Limitations rasied table    Other ROM/Strengthening Exercises ABCs against wall, elbow  extended, arm elevated to 90 deg x1 round    Other ROM/Strengthening Exercises BUE inferior shrugs using cable tower, x20# x15 reps; BUE serratus punch x15 reps with 20#                PT Education - 02/22/17 1612    Education provided Yes   Education Details Discussed progress; technique with therex   Person(s) Educated Patient   Methods Explanation;Verbal cues;Tactile cues   Comprehension Verbalized understanding;Returned demonstration          PT Short Term Goals - 02/01/17 1638      PT SHORT TERM GOAL #1   Title pt will feel more steady when getting out of bed due to increased strength and body awareness   Time 4   Period Weeks   Status Achieved     PT SHORT TERM GOAL #2   Title independent with initial HEP   Baseline 30% consistent with HEP    Time 4   Period Weeks   Status Partially Met     PT SHORT  TERM GOAL #3   Title improved PROM to 120 deg flexion and 90 deg external rotation of left shoulder   Baseline only about 25 deg at 90 deg abduction   Time 4   Period Weeks   Status Partially Met     PT SHORT TERM GOAL #4   Title left shoulder strength improved to be able to peform AROM flexion and abduction to 90 degrees for improved functional lifting activities   Baseline 80 deg    Time 4   Period Weeks   Status Partially Met     PT SHORT TERM GOAL #5   Title pain reduced by 25%   Baseline 80% improvement in pain    Time 4   Period Weeks   Status Achieved           PT Long Term Goals - 02/01/17 1641      PT LONG TERM GOAL #1   Title patient able to get dressed by herself due to improved strength and ROM   Baseline pt feels this is improved, but she is still unable to fasten her bra   Time 8   Period Weeks   Status Partially Met     PT LONG TERM GOAL #2   Title Pt able to perform overhead activities such as putting things in her cabinets due to improved strength and AROM   Baseline unable with Lt    Time 8   Period Weeks   Status Not Met     PT LONG TERM GOAL #3   Title independent with advanced HEP   Time 8   Period Weeks   Status On-going     PT LONG TERM GOAL #4   Title FOTO < or = to 44% limited   Time 8   Period Weeks   Status On-going     PT LONG TERM GOAL #5   Title pain reduced by 60% or more   Baseline pain and symptoms improved 80%   Time 8   Period Weeks   Status Achieved  8.15.18: 5/10 pain at worst.  25% reduction in pain                Plan - 02/22/17 1614    Clinical Impression Statement Pt arriving today with minimal issues following last session. Progressed AAROM therex to upright position noting increased difficulty and (+) shoulder shrug on the Lt due to  weakness and dominant upper trap activation. This did improve with heavy verbal/tactile cuing. Also progressed rotator cuff strengthening with significant limitations in  shoulder external rotation ROM and strength. Ended with report of shoulder fatigue but no increase pain.    PT Treatment/Interventions ADLs/Self Care Home Management;Biofeedback;Cryotherapy;Electrical Stimulation;Iontophoresis 12m/ml Dexamethasone;Moist Heat;Ultrasound;Functional mobility training;Therapeutic activities;Therapeutic exercise;Neuromuscular re-education;Passive range of motion;Patient/family education;Dry needling;Taping;Manual techniques   PT Next Visit Plan AAROM as well as shoulder and scapular strengthening progressions   PT Home Exercise Plan added incline pushup, wall washes   Consulted and Agree with Plan of Care Patient      Patient will benefit from skilled therapeutic intervention in order to improve the following deficits and impairments:  Decreased activity tolerance, Decreased mobility, Decreased range of motion, Decreased strength, Impaired UE functional use, Pain, Increased muscle spasms, Impaired flexibility  Visit Diagnosis: Acute pain of left shoulder  Muscle weakness (generalized)  Abnormal posture     Problem List Patient Active Problem List   Diagnosis Date Noted  . Anterior dislocation of left shoulder 12/20/2016  . Closed fracture of proximal end of left humerus with routine healing 12/20/2016  . Dyspnea on exertion 07/27/2016  . RBBB 03/04/2016  . OSA on CPAP 03/04/2016  . ILD (interstitial lung disease) ([place]  c/w NSIP p macrodantin exp 02/11/2016  . Morbid obesity due to excess calories ([place] 02/11/2016  . Chronic respiratory failure with hypoxia ([place] 01/07/2016  . Upper airway cough syndrome 12/06/2015  . COPD GOLD 0 12/05/2015  . Acute cholecystitis 07/19/2014    4:16 PM,02/22/17 SElly ModenaPT, DPT CPrud[facility] Bayat [facility] [address] [place] [address]Phone: [phone]  Fax:  [phone] Name: Lindsay RomickMRN: [id number] of Birth: 902-Mar-1952

## 2017-02-24 ENCOUNTER — Ambulatory Visit: Payer: Medicare Other | Admitting: Physical Therapy

## 2017-02-24 DIAGNOSIS — R293 Abnormal posture: Secondary | ICD-10-CM

## 2017-02-24 DIAGNOSIS — M25512 Pain in left shoulder: Secondary | ICD-10-CM | POA: Diagnosis not present

## 2017-02-24 DIAGNOSIS — M6281 Muscle weakness (generalized): Secondary | ICD-10-CM

## 2017-02-24 NOTE — Therapy (Signed)
[facility] [address] [place], [place], [address] Phone: [phone]   Fax:  [phone]  Physical Therapy Treatment  Patient Details  Name: Lindsay Brady MRN: [id number] Date of Birth: 1950/10/28 Referring Provider: Meridee Score, MD   Encounter Date: 02/24/2017      PT End of Session - 02/24/17 1538    Visit Number 13   Number of Visits 17   Date for PT Re-Evaluation 03/30/17   Authorization Type Medicare A and B (G-codes updated on 2022/07/30 visit   Authorization Time Period 02/02/17 to 03/30/17   Authorization - Visit Number 6   Authorization - Number of Visits 10   PT Start Time 9924   PT Stop Time 1610   PT Time Calculation (min) 40 min   Activity Tolerance Patient tolerated treatment well;No increased pain   Behavior During Therapy WFL for tasks assessed/performed      Past Medical History:  Diagnosis Date  . Anxiety   . Arthritis   . Cholecystitis 07/2014  . COPD (chronic obstructive pulmonary disease) ([place])   . Diabetes mellitus without complication ([place])    type 2   . Fracture 2013 ?   left leg  . GERD (gastroesophageal reflux disease)    hx of gerd  . Headache   . Hx: UTI (urinary tract infection)   . OA (osteoarthritis) of knee   . Pneumonia    hx of viral pna years ago  . Shortness of breath dyspnea   . Sleep apnea    uses cpap  . Urgency of urination     Past Surgical History:  Procedure Laterality Date  . ABDOMINAL HYSTERECTOMY    . CARPAL TUNNEL RELEASE    . CHOLECYSTECTOMY N/A 07/19/2014   Procedure: LAPAROSCOPIC CHOLECYSTECTOMY WITH INTRAOPERATIVE CHOLANGIOGRAM;  Surgeon: Georganna Skeans, MD;  Location: [facility];  Service: General;  Laterality: N/A;  . COLONOSCOPY  ?   2011  . KNEE ARTHROSCOPY    . TONSILLECTOMY      There were no vitals filed for this visit.      Subjective Assessment - 02/24/17 1533    Subjective Pt has no complaints currently. Her arm was just tired following  her last session, no soreness that she could notice. She reports that occasionally her shoulder with "twang" which she does not feel is too uncomfortable.    Patient Stated Goals have it back before it started   Currently in Pain? No/denies   Pain Onset More than a month ago                         [facility] Adult PT Treatment/Exercise - 02/24/17 0001      Shoulder Exercises: Supine   Horizontal ABduction Both;Strengthening;15 reps   Theraband Level (Shoulder Horizontal ABduction) Level 1 (Yellow)   Horizontal ABduction Limitations x2 sets    Flexion 10 reps;Strengthening;Both   Theraband Level (Shoulder Flexion) Level 1 (Yellow)   Flexion Limitations x2 sets, horizontal abduction hold during BUE elevation   Other Supine Exercises LUE PNF D2 flexion with yellow TB 2x10 reps  alternating with horizontal abduction     Shoulder Exercises: Pulleys   Flexion 3 minutes   ABduction 3 minutes  cues to decrease Lt upper trap activation     Shoulder Exercises: ROM/Strengthening   Prot/Ret//Elev/Dep Scap retraction x15 reps    Other ROM/Strengthening Exercises ABCs against wall, elbow extended, arm elevated to 90 deg x1 round  Other ROM/Strengthening Exercises wall ladder LUE x10 reps flexion, x10 reps abduction  mirror feedback provided for head alignment correction                PT Education - 02/24/17 1611    Education provided Yes   Education Details progressions to reps on HEP; importance of decreasing upper trap activation.    Person(s) Educated Patient   Methods Explanation;Tactile cues;Verbal cues   Comprehension Verbalized understanding;Returned demonstration          PT Short Term Goals - 02/01/17 1638      PT SHORT TERM GOAL #1   Title pt will feel more steady when getting out of bed due to increased strength and body awareness   Time 4   Period Weeks   Status Achieved     PT SHORT TERM GOAL #2   Title independent with initial HEP   Baseline  30% consistent with HEP    Time 4   Period Weeks   Status Partially Met     PT SHORT TERM GOAL #3   Title improved PROM to 120 deg flexion and 90 deg external rotation of left shoulder   Baseline only about 25 deg at 90 deg abduction   Time 4   Period Weeks   Status Partially Met     PT SHORT TERM GOAL #4   Title left shoulder strength improved to be able to peform AROM flexion and abduction to 90 degrees for improved functional lifting activities   Baseline 80 deg    Time 4   Period Weeks   Status Partially Met     PT SHORT TERM GOAL #5   Title pain reduced by 25%   Baseline 80% improvement in pain    Time 4   Period Weeks   Status Achieved           PT Long Term Goals - 02/01/17 1641      PT LONG TERM GOAL #1   Title patient able to get dressed by herself due to improved strength and ROM   Baseline pt feels this is improved, but she is still unable to fasten her bra   Time 8   Period Weeks   Status Partially Met     PT LONG TERM GOAL #2   Title Pt able to perform overhead activities such as putting things in her cabinets due to improved strength and AROM   Baseline unable with Lt    Time 8   Period Weeks   Status Not Met     PT LONG TERM GOAL #3   Title independent with advanced HEP   Time 8   Period Weeks   Status On-going     PT LONG TERM GOAL #4   Title FOTO < or = to 44% limited   Time 8   Period Weeks   Status On-going     PT LONG TERM GOAL #5   Title pain reduced by 60% or more   Baseline pain and symptoms improved 80%   Time 8   Period Weeks   Status Achieved  8.15.18: 5/10 pain at worst.  25% reduction in pain                Plan - 02/24/17 1607    Clinical Impression Statement Pt continues to make progress towards her goals with increasing shoulder AAROM. Pt demonstrates over activation of the Lt upper trap during upright shoulder elevation specifically, but responded well to  mirror feedback. Incorporated more scapular stability  strengthening therex this session with good completion and no increase in symptoms or pain. Pt would continue to benefit from skilled PT to address her limitations in shoulder neuromuscular control, strength and endurance.    PT Treatment/Interventions ADLs/Self Care Home Management;Biofeedback;Cryotherapy;Electrical Stimulation;Iontophoresis 63m/ml Dexamethasone;Moist Heat;Ultrasound;Functional mobility training;Therapeutic activities;Therapeutic exercise;Neuromuscular re-education;Passive range of motion;Patient/family education;Dry needling;Taping;Manual techniques   PT Next Visit Plan upright AAROM as well as shoulder and scapular strengthening progressions; rhythmic stabilization and scapular neuromuscular control   PT Home Exercise Plan added incline pushup, wall washes   Consulted and Agree with Plan of Care Patient      Patient will benefit from skilled therapeutic intervention in order to improve the following deficits and impairments:  Decreased activity tolerance, Decreased mobility, Decreased range of motion, Decreased strength, Impaired UE functional use, Pain, Increased muscle spasms, Impaired flexibility  Visit Diagnosis: Acute pain of left shoulder  Muscle weakness (generalized)  Abnormal posture     Problem List Patient Active Problem List   Diagnosis Date Noted  . Anterior dislocation of left shoulder 12/20/2016  . Closed fracture of proximal end of left humerus with routine healing 12/20/2016  . Dyspnea on exertion 07/27/2016  . RBBB 03/04/2016  . OSA on CPAP 03/04/2016  . ILD (interstitial lung disease) ([place]  c/w NSIP p macrodantin exp 02/11/2016  . Morbid obesity due to excess calories ([facility] 02/11/2016  . Chronic respiratory failure with hypoxia ([place] 01/07/2016  . Upper airway cough syndrome 12/06/2015  . COPD GOLD 0 12/05/2015  . Acute cholecystitis 07/19/2014   4:56 PM,02/24/17 SElly ModenaPT, DPT [facility] [address] [place] [address]Phone: [phone]  Fax:  [phone] Name: Lindsay GravlinMRN: [id number] of Birth: [birth year]

## 2017-02-28 ENCOUNTER — Encounter: Payer: Medicare Other | Admitting: Physical Therapy

## 2017-03-01 ENCOUNTER — Ambulatory Visit: Payer: Medicare Other | Attending: Orthopedic Surgery | Admitting: Physical Therapy

## 2017-03-01 DIAGNOSIS — R293 Abnormal posture: Secondary | ICD-10-CM | POA: Diagnosis present

## 2017-03-01 DIAGNOSIS — M25512 Pain in left shoulder: Secondary | ICD-10-CM | POA: Diagnosis present

## 2017-03-01 DIAGNOSIS — M6281 Muscle weakness (generalized): Secondary | ICD-10-CM | POA: Diagnosis not present

## 2017-03-01 NOTE — Therapy (Signed)
[facility] [address] [place], [place], [address] Phone: [phone]   Fax:  [phone]  Physical Therapy Treatment  Patient Details  Name: Lindsay Brady MRN: [id number] Date of Birth: 30-Jan-1951 Referring Provider: Meridee Score, MD   Encounter Date: 03/01/2017      PT End of Session - 03/01/17 1621    Visit Number 14   Number of Visits 17   Date for PT Re-Evaluation 03/30/17   Authorization Type Medicare A and B (G-codes updated on 08/05/2022 visit   Authorization Time Period 02/02/17 to 03/30/17   Authorization - Visit Number 7   Authorization - Number of Visits 10   PT Start Time 3734   PT Stop Time 2876   PT Time Calculation (min) 39 min   Activity Tolerance Patient tolerated treatment well;No increased pain   Behavior During Therapy WFL for tasks assessed/performed      Past Medical History:  Diagnosis Date  . Anxiety   . Arthritis   . Cholecystitis 07/2014  . COPD (chronic obstructive pulmonary disease) ([place])   . Diabetes mellitus without complication ([place])    type 2   . Fracture 2013 ?   left leg  . GERD (gastroesophageal reflux disease)    hx of gerd  . Headache   . Hx: UTI (urinary tract infection)   . OA (osteoarthritis) of knee   . Pneumonia    hx of viral pna years ago  . Shortness of breath dyspnea   . Sleep apnea    uses cpap  . Urgency of urination     Past Surgical History:  Procedure Laterality Date  . ABDOMINAL HYSTERECTOMY    . CARPAL TUNNEL RELEASE    . CHOLECYSTECTOMY N/A 07/19/2014   Procedure: LAPAROSCOPIC CHOLECYSTECTOMY WITH INTRAOPERATIVE CHOLANGIOGRAM;  Surgeon: Georganna Skeans, MD;  Location: [facility];  Service: General;  Laterality: N/A;  . COLONOSCOPY  ?   2011  . KNEE ARTHROSCOPY    . TONSILLECTOMY      There were no vitals filed for this visit.      Subjective Assessment - 03/01/17 1622    Subjective Pt reports that things are going well. She was carrying something  the other day and accidentally switched it to the Lt arm which made it pretty sore. No pain currently.    Patient Stated Goals have it back before it started   Currently in Pain? No/denies   Pain Onset More than a month ago                         [facility] Adult PT Treatment/Exercise - 03/01/17 0001      Shoulder Exercises: Supine   External Rotation Left;AAROM;15 reps;Other (comment)  x2 sets, reclined    Flexion 15 reps;Both;AAROM   Flexion Limitations reclined position, 5 sec hold end range, x2 sets      Shoulder Exercises: Standing   Horizontal ABduction Both;10 reps   Theraband Level (Shoulder Horizontal ABduction) Level 2 (Red)   Extension Both;10 reps;Strengthening   Theraband Level (Shoulder Extension) Level 2 (Red)     Shoulder Exercises: Pulleys   Flexion 3 minutes   ABduction 3 minutes  cues to decrease trunk lean compensation     Manual Therapy   Myofascial Release TPR Lt latissimus and teres   Passive ROM PROM into Lt shoulder abduction and shoulder ER (90deg abd)  PT Education - 03/01/17 1702    Education provided Yes   Education Details encouraged pt to push ROM exercises more at home due to noted remaining limitations in this.    Person(s) Educated Patient   Methods Explanation   Comprehension Verbalized understanding          PT Short Term Goals - 02/01/17 1638      PT SHORT TERM GOAL #1   Title pt will feel more steady when getting out of bed due to increased strength and body awareness   Time 4   Period Weeks   Status Achieved     PT SHORT TERM GOAL #2   Title independent with initial HEP   Baseline 30% consistent with HEP    Time 4   Period Weeks   Status Partially Met     PT SHORT TERM GOAL #3   Title improved PROM to 120 deg flexion and 90 deg external rotation of left shoulder   Baseline only about 25 deg at 90 deg abduction   Time 4   Period Weeks   Status Partially Met     PT SHORT TERM GOAL  #4   Title left shoulder strength improved to be able to peform AROM flexion and abduction to 90 degrees for improved functional lifting activities   Baseline 80 deg    Time 4   Period Weeks   Status Partially Met     PT SHORT TERM GOAL #5   Title pain reduced by 25%   Baseline 80% improvement in pain    Time 4   Period Weeks   Status Achieved           PT Long Term Goals - 02/01/17 1641      PT LONG TERM GOAL #1   Title patient able to get dressed by herself due to improved strength and ROM   Baseline pt feels this is improved, but she is still unable to fasten her bra   Time 8   Period Weeks   Status Partially Met     PT LONG TERM GOAL #2   Title Pt able to perform overhead activities such as putting things in her cabinets due to improved strength and AROM   Baseline unable with Lt    Time 8   Period Weeks   Status Not Met     PT LONG TERM GOAL #3   Title independent with advanced HEP   Time 8   Period Weeks   Status On-going     PT LONG TERM GOAL #4   Title FOTO < or = to 44% limited   Time 8   Period Weeks   Status On-going     PT LONG TERM GOAL #5   Title pain reduced by 60% or more   Baseline pain and symptoms improved 80%   Time 8   Period Weeks   Status Achieved  8.15.18: 5/10 pain at worst.  25% reduction in pain                Plan - 03/01/17 1703    Clinical Impression Statement Focused today on addressing active and passive Lt shoulder ROM. Adjusted AAROM into flexion and ER in a reclined position rather than the upright position in order to decrease upper trap shrugging noted last session. Also completed passive ROM and manual techniques to decrease muscle spasm. Noting improvements in passive ROM overall, however pt continues to be limited in all directions with end  range resistance and guarding. No increase in pain noted by the end of today's session, and pt verbalized understanding of therapist encouragement to increase HEP adherence at  home.    PT Treatment/Interventions ADLs/Self Care Home Management;Biofeedback;Cryotherapy;Electrical Stimulation;Iontophoresis 57m/ml Dexamethasone;Moist Heat;Ultrasound;Functional mobility training;Therapeutic activities;Therapeutic exercise;Neuromuscular re-education;Passive range of motion;Patient/family education;Dry needling;Taping;Manual techniques   PT Next Visit Plan reclined AAROM as well as shoulder and scapular strengthening progressions; rhythmic stabilization and scapular neuromuscular control   PT Home Exercise Plan added incline pushup, wall washes   Consulted and Agree with Plan of Care Patient      Patient will benefit from skilled therapeutic intervention in order to improve the following deficits and impairments:  Decreased activity tolerance, Decreased mobility, Decreased range of motion, Decreased strength, Impaired UE functional use, Pain, Increased muscle spasms, Impaired flexibility  Visit Diagnosis: Muscle weakness (generalized)  Acute pain of left shoulder  Abnormal posture     Problem List Patient Active Problem List   Diagnosis Date Noted  . Anterior dislocation of left shoulder 12/20/2016  . Closed fracture of proximal end of left humerus with routine healing 12/20/2016  . Dyspnea on exertion 07/27/2016  . RBBB 03/04/2016  . OSA on CPAP 03/04/2016  . ILD (interstitial lung disease) ([place]  c/w NSIP p macrodantin exp 02/11/2016  . Morbid obesity due to excess calories ([place] 02/11/2016  . Chronic respiratory failure with hypoxia ([place] 01/07/2016  . Upper airway cough syndrome 12/06/2015  . COPD GOLD 0 12/05/2015  . Acute cholecystitis 07/19/2014    5:09 PM,03/01/17 SElly ModenaPT, DPT CPle[place]vesat [facility] [address] [place] [address]Phone: [phone]  Fax:  [phone] Name: Lindsay EstellMRN: [id number] of  Birth: [birth year]

## 2017-03-03 ENCOUNTER — Ambulatory Visit: Payer: Medicare Other | Admitting: Physical Therapy

## 2017-03-07 ENCOUNTER — Ambulatory Visit: Payer: Medicare Other | Admitting: Physical Therapy

## 2017-03-07 DIAGNOSIS — M6281 Muscle weakness (generalized): Secondary | ICD-10-CM | POA: Diagnosis not present

## 2017-03-07 DIAGNOSIS — M25512 Pain in left shoulder: Secondary | ICD-10-CM

## 2017-03-07 DIAGNOSIS — R293 Abnormal posture: Secondary | ICD-10-CM

## 2017-03-07 NOTE — Therapy (Signed)
[facility] [address] [place], [place], [address] Phone: [phone]   Fax:  [phone]  Physical Therapy Treatment  Patient Details  Name: Perl Folmar MRN: [id number] Date of Birth: 1950/10/13 Referring Provider: Meridee Score, MD   Encounter Date: 03/07/2017      PT End of Session - 03/07/17 1159    Visit Number 15   Number of Visits 17   Date for PT Re-Evaluation 03/30/17   Authorization Type Medicare A and B (G-codes updated on 08/03/22 visit   Authorization Time Period 02/02/17 to 03/30/17   Authorization - Visit Number 8   Authorization - Number of Visits 10   PT Start Time 4403   PT Stop Time 1230   PT Time Calculation (min) 38 min   Activity Tolerance Patient tolerated treatment well;No increased pain   Behavior During Therapy WFL for tasks assessed/performed      Past Medical History:  Diagnosis Date  . Anxiety   . Arthritis   . Cholecystitis 07/2014  . COPD (chronic obstructive pulmonary disease) ([place])   . Diabetes mellitus without complication ([place])    type 2   . Fracture 2013 ?   left leg  . GERD (gastroesophageal reflux disease)    hx of gerd  . Headache   . Hx: UTI (urinary tract infection)   . OA (osteoarthritis) of knee   . Pneumonia    hx of viral pna years ago  . Shortness of breath dyspnea   . Sleep apnea    uses cpap  . Urgency of urination     Past Surgical History:  Procedure Laterality Date  . ABDOMINAL HYSTERECTOMY    . CARPAL TUNNEL RELEASE    . CHOLECYSTECTOMY N/A 07/19/2014   Procedure: LAPAROSCOPIC CHOLECYSTECTOMY WITH INTRAOPERATIVE CHOLANGIOGRAM;  Surgeon: Georganna Skeans, MD;  Location: [facility];  Service: General;  Laterality: N/A;  . COLONOSCOPY  ?   2011  . KNEE ARTHROSCOPY    . TONSILLECTOMY      There were no vitals filed for this visit.      Subjective Assessment - 03/07/17 1157    Subjective Pt reports that things are going well. She has no complaints  currently.    Patient Stated Goals have it back before it started   Currently in Pain? No/denies   Pain Onset More than a month ago                         [facility] Adult PT Treatment/Exercise - 03/07/17 0001      Shoulder Exercises: Supine   Flexion AAROM;Both;20 reps     Shoulder Exercises: Standing   External Rotation 10 reps   Theraband Level (Shoulder External Rotation) Level 3 (Green)   Internal Rotation 20 reps;Left;Strengthening   Theraband Level (Shoulder Internal Rotation) Level 3 (Green)   Internal Rotation Limitations towel squeeze under arm    Other Standing Exercises LUE finger ladder x20 reps, 5 sec hold end range stretch (Pt taking intermittent rest breaks)  flexion (L20) and abduction (L19)   Other Standing Exercises pendulums LUE, x10 reps CW/CCW     Shoulder Exercises: ROM/Strengthening   UBE (Upper Arm Bike) L2, 2' forward/2' backward    Rhythmic Stabilization, Supine LUE at 90 deg, 4x20 sec external perturbations at wrist level                 PT Education - 03/07/17 1231    Education provided Yes  Education Details pointed out continued limitations in upper trap over activation; technique with therex   Person(s) Educated Patient   Methods Explanation;Verbal cues;Tactile cues;Other (comment)  mirror feedback    Comprehension Verbalized understanding;Returned demonstration          PT Short Term Goals - 02/01/17 1638      PT SHORT TERM GOAL #1   Title pt will feel more steady when getting out of bed due to increased strength and body awareness   Time 4   Period Weeks   Status Achieved     PT SHORT TERM GOAL #2   Title independent with initial HEP   Baseline 30% consistent with HEP    Time 4   Period Weeks   Status Partially Met     PT SHORT TERM GOAL #3   Title improved PROM to 120 deg flexion and 90 deg external rotation of left shoulder   Baseline only about 25 deg at 90 deg abduction   Time 4   Period Weeks    Status Partially Met     PT SHORT TERM GOAL #4   Title left shoulder strength improved to be able to peform AROM flexion and abduction to 90 degrees for improved functional lifting activities   Baseline 80 deg    Time 4   Period Weeks   Status Partially Met     PT SHORT TERM GOAL #5   Title pain reduced by 25%   Baseline 80% improvement in pain    Time 4   Period Weeks   Status Achieved           PT Long Term Goals - 02/01/17 1641      PT LONG TERM GOAL #1   Title patient able to get dressed by herself due to improved strength and ROM   Baseline pt feels this is improved, but she is still unable to fasten her bra   Time 8   Period Weeks   Status Partially Met     PT LONG TERM GOAL #2   Title Pt able to perform overhead activities such as putting things in her cabinets due to improved strength and AROM   Baseline unable with Lt    Time 8   Period Weeks   Status Not Met     PT LONG TERM GOAL #3   Title independent with advanced HEP   Time 8   Period Weeks   Status On-going     PT LONG TERM GOAL #4   Title FOTO < or = to 44% limited   Time 8   Period Weeks   Status On-going     PT LONG TERM GOAL #5   Title pain reduced by 60% or more   Baseline pain and symptoms improved 80%   Time 8   Period Weeks   Status Achieved  8.15.18: 5/10 pain at worst.  25% reduction in pain                Plan - 03/07/17 1233    Clinical Impression Statement Pt demonstrated improved shoulder ROM this session while using the finger ladder. She does require moderate to high levels of encouragement to push her ROM at end ranges specifically. Pt was able to complete shoulder IR/ER strengthening therex with increased resistance and reps without increase in pain, reporting muscle fatigue only. Will continue with current POC.    PT Treatment/Interventions ADLs/Self Care Home Management;Biofeedback;Cryotherapy;Electrical Stimulation;Iontophoresis 31m/ml Dexamethasone;Moist  Heat;Ultrasound;Functional mobility training;Therapeutic activities;Therapeutic  exercise;Neuromuscular re-education;Passive range of motion;Patient/family education;Dry needling;Taping;Manual techniques   PT Next Visit Plan ROM measurement; reclined AAROM as well as shoulder and scapular strengthening progressions; rhythmic stabilization and scapular neuromuscular control   PT Home Exercise Plan added incline pushup, wall washes   Consulted and Agree with Plan of Care Patient      Patient will benefit from skilled therapeutic intervention in order to improve the following deficits and impairments:  Decreased activity tolerance, Decreased mobility, Decreased range of motion, Decreased strength, Impaired UE functional use, Pain, Increased muscle spasms, Impaired flexibility  Visit Diagnosis: Muscle weakness (generalized)  Acute pain of left shoulder  Abnormal posture     Problem List Patient Active Problem List   Diagnosis Date Noted  . Anterior dislocation of left shoulder 12/20/2016  . Closed fracture of proximal end of left humerus with routine healing 12/20/2016  . Dyspnea on exertion 07/27/2016  . RBBB 03/04/2016  . OSA on CPAP 03/04/2016  . ILD (interstitial lung disease) ([place])  c/w NSIP p macrodantin exp 02/11/2016  . Morbid obesity due to excess calories ([place]) 02/11/2016  . Chronic respiratory failure with hypoxia ([place]) 01/07/2016  . Upper airway cough syndrome 12/06/2015  . COPD GOLD 0 12/05/2015  . Acute cholecystitis 07/19/2014    12:48 PM,03/07/17 Elly Modena PT, DPT [facility] [address], [place], [place], [address] Phone: [phone]   Fax:  [phone]  Name: Corley Kohls MRN: [id number] Date of Birth: 1951-03-07

## 2017-03-08 ENCOUNTER — Ambulatory Visit (INDEPENDENT_AMBULATORY_CARE_PROVIDER_SITE_OTHER): Payer: Medicare Other | Admitting: Orthopedic Surgery

## 2017-03-10 ENCOUNTER — Ambulatory Visit (INDEPENDENT_AMBULATORY_CARE_PROVIDER_SITE_OTHER): Payer: Medicare Other | Admitting: Family

## 2017-03-10 ENCOUNTER — Encounter: Payer: Medicare Other | Admitting: Physical Therapy

## 2017-03-10 DIAGNOSIS — M7061 Trochanteric bursitis, right hip: Secondary | ICD-10-CM | POA: Diagnosis not present

## 2017-03-10 DIAGNOSIS — S43015S Anterior dislocation of left humerus, sequela: Secondary | ICD-10-CM | POA: Diagnosis not present

## 2017-03-10 DIAGNOSIS — S42295D Other nondisplaced fracture of upper end of left humerus, subsequent encounter for fracture with routine healing: Secondary | ICD-10-CM | POA: Diagnosis not present

## 2017-03-10 DIAGNOSIS — M1712 Unilateral primary osteoarthritis, left knee: Secondary | ICD-10-CM | POA: Insufficient documentation

## 2017-03-10 NOTE — Progress Notes (Signed)
Office Visit Note   Patient: Lindsay Brady           Date of Birth: 25-Jul-1950           MRN: [id number] Visit Date: 03/10/2017              Requested by: Tegeler, Smith Mince, MD No address on file PCP: Tegeler, Smith Mince, MD  Chief Complaint  Patient presents with  . Left Shoulder - Pain      HPI: Patient is a 66 year old woman who presents in follow up status post close reduction left shoulder dislocation with proximal humerus fracture. Patient is been going to physical therapy 2 times a week. She is pleased with her progress with therapy, improved rom and reduced pain. States only remaining goals to be able to do her own hair and fasten her bra independently.   Of note has been wearing night splint for carpal tunnel on left, feels is helpful. Minimal symptoms throughout day. Worsening of pain by end of day. Does feel some weakness with grip, in long term may want to consider carpal tunnel release.  Also seen for 2 other separate issues. Is having chronic left knee pain, has been ongoing for years. Known OA. Hx of scope for meniscal tear. Feels like bone just grinding on bone, crepitation. Start up pain and stiffness. Locking, catching and giving way. Has had interval relief with prior hyaluronic acid injections. Requesting repeat injection today. Did not have relief with cortisone injections in past  Also having right lateral hip pain. This is point tender to palpation laterally. States hurts with movement from sitting to standing position. Improves with prolonged walking.   Assessment & Plan: Visit Diagnoses:  1. Other closed nondisplaced fracture of proximal end of left humerus with routine healing, subsequent encounter   2. Anterior dislocation of left shoulder, sequela   3. Primary osteoarthritis of left knee   4. Trochanteric bursitis of right hip     Plan: Recommended she continue physical therapy and she will need to continue with her exercises for at least a year.  Patient will follow-up in 4 weeks for reevaluation. Will order Synvisc for left knee as has had with relief in past. Offered depomedrol injection right hip, patient deferred today. Will consider at next visit if having on going symptoms.  Follow-Up Instructions: Return in about 4 weeks (around 04/07/2017).   Left Shoulder Exam   Range of Motion  Active Abduction: 90  Passive Abduction: 110   Other  Erythema: absent Pulse: present       Patient is alert, oriented, no adenopathy, well-dressed, normal affect, normal respiratory effort. Patient has a adductor lurch on the left. Patient can get her hand to the top of her head.  Imaging: No results found. No images are attached to the encounter.  Labs: Lab Results  Component Value Date   ESRSEDRATE 34 (H) 09/08/2016   ESRSEDRATE 31 (H) 07/27/2016   ESRSEDRATE 26 06/15/2016   CRP 2.4 01/07/2016    Orders:  No orders of the defined types were placed in this encounter.  No orders of the defined types were placed in this encounter.    Procedures: No procedures performed  Clinical Data: No additional findings.  ROS:  All other systems negative, except as noted in the HPI. Review of Systems  Constitutional: Negative for chills and fever.  Musculoskeletal: Positive for arthralgias. Negative for joint swelling and myalgias.  Neurological: Positive for weakness. Negative for numbness.    Objective:  Vital Signs: There were no vitals taken for this visit.  Specialty Comments:  No specialty comments available.  PMFS History: Patient Active Problem List   Diagnosis Date Noted  . Primary osteoarthritis of left knee 03/10/2017  . Trochanteric bursitis of right hip 03/10/2017  . Anterior dislocation of left shoulder 12/20/2016  . Closed fracture of proximal end of left humerus with routine healing 12/20/2016  . Dyspnea on exertion 07/27/2016  . RBBB 03/04/2016  . OSA on CPAP 03/04/2016  . ILD (interstitial lung  disease) ([facility])  c/w NSIP p macrodantin exp 02/11/2016  . Morbid obesity due to excess calories ([facility]) 02/11/2016  . Chronic respiratory failure with hypoxia ([facility]) 01/07/2016  . Upper airway cough syndrome 12/06/2015  . COPD GOLD 0 12/05/2015  . Acute cholecystitis 07/19/2014   Past Medical History:  Diagnosis Date  . Anxiety   . Arthritis   . Cholecystitis 07/2014  . COPD (chronic obstructive pulmonary disease) ([facility])   . Diabetes mellitus without complication ([facility])    type 2   . Fracture 2013 ?   left leg  . GERD (gastroesophageal reflux disease)    hx of gerd  . Headache   . Hx: UTI (urinary tract infection)   . OA (osteoarthritis) of knee   . Pneumonia    hx of viral pna years ago  . Shortness of breath dyspnea   . Sleep apnea    uses cpap  . Urgency of urination     Family History  Problem Relation Age of Onset  . Stroke Mother   . Lung cancer Father        smoked  . Colon cancer Father   . Cancer Paternal Grandmother   . Cancer Paternal Grandfather     Past Surgical History:  Procedure Laterality Date  . ABDOMINAL HYSTERECTOMY    . CARPAL TUNNEL RELEASE    . CHOLECYSTECTOMY N/A 07/19/2014   Procedure: LAPAROSCOPIC CHOLECYSTECTOMY WITH INTRAOPERATIVE CHOLANGIOGRAM;  Surgeon: Violeta Gelinas, MD;  Location: [facility];  Service: General;  Laterality: N/A;  . COLONOSCOPY  ?   2011  . KNEE ARTHROSCOPY    . TONSILLECTOMY     Social History   Occupational History  . Not on file.   Social History Main Topics  . Smoking status: Former Smoker    Packs/day: 2.00    Years: 30.00    Types: Cigarettes    Quit date: 05/31/1996  . Smokeless tobacco: Never Used  . Alcohol use No     Comment: rare  . Drug use: No  . Sexual activity: Not on file

## 2017-03-11 ENCOUNTER — Ambulatory Visit: Payer: Medicare Other | Admitting: Physical Therapy

## 2017-03-11 ENCOUNTER — Encounter: Payer: Self-pay | Admitting: Physical Therapy

## 2017-03-11 DIAGNOSIS — M6281 Muscle weakness (generalized): Secondary | ICD-10-CM | POA: Diagnosis not present

## 2017-03-11 DIAGNOSIS — M25512 Pain in left shoulder: Secondary | ICD-10-CM

## 2017-03-11 DIAGNOSIS — R293 Abnormal posture: Secondary | ICD-10-CM

## 2017-03-11 NOTE — Therapy (Signed)
[facility] [address], [place], [place], [address] Phone: [phone]   Fax:  [phone]  Physical Therapy Treatment  Patient Details  Name: Lindsay Brady MRN: [id number] Date of Birth: 1951-04-12 Referring Provider: Meridee Score, MD   Encounter Date: 03/11/2017      PT End of Session - 03/11/17 1101    Visit Number 16   Number of Visits 17   Date for PT Re-Evaluation 03/30/17   Authorization Type Medicare A and B (G-codes updated on 07/11/2022 visit; Next g-code is to be done on the 18th visit   Authorization Time Period 02/02/17 to 03/30/17   Authorization - Visit Number 9   Authorization - Number of Visits 10   PT Start Time 0258   PT Stop Time 1100   PT Time Calculation (min) 45 min   Activity Tolerance Patient tolerated treatment well;No increased pain   Behavior During Therapy WFL for tasks assessed/performed      Past Medical History:  Diagnosis Date  . Anxiety   . Arthritis   . Cholecystitis 07/2014  . COPD (chronic obstructive pulmonary disease) ([place])   . Diabetes mellitus without complication ([place])    type 2   . Fracture 2013 ?   left leg  . GERD (gastroesophageal reflux disease)    hx of gerd  . Headache   . Hx: UTI (urinary tract infection)   . OA (osteoarthritis) of knee   . Pneumonia    hx of viral pna years ago  . Shortness of breath dyspnea   . Sleep apnea    uses cpap  . Urgency of urination     Past Surgical History:  Procedure Laterality Date  . ABDOMINAL HYSTERECTOMY    . CARPAL TUNNEL RELEASE    . CHOLECYSTECTOMY N/A 07/19/2014   Procedure: LAPAROSCOPIC CHOLECYSTECTOMY WITH INTRAOPERATIVE CHOLANGIOGRAM;  Surgeon: Georganna Skeans, MD;  Location: [facility];  Service: General;  Laterality: N/A;  . COLONOSCOPY  ?   2011  . KNEE ARTHROSCOPY    . TONSILLECTOMY      There were no vitals filed for this visit.      Subjective Assessment - 03/11/17 1018    Subjective I felt okay after  last visit.  I am tender to the touch on the shoulder.    Limitations Other (comment)  dressing; fixing hair   Patient Stated Goals have it back before it started   Currently in Pain? Yes   Pain Score 3    Pain Location Shoulder   Pain Orientation Left   Pain Descriptors / Indicators Sharp;Discomfort   Pain Type Acute pain   Pain Onset More than a month ago   Pain Frequency Intermittent   Aggravating Factors  shoulder movement   Pain Relieving Factors rest   Effect of Pain on Daily Activities limit overhead activities, cannot sleep on left side   Multiple Pain Sites No            [facility] PT Assessment - 03/11/17 0001      AROM   Left Shoulder Flexion 115 Degrees  sitting     PROM   Left Shoulder Internal Rotation 66 Degrees   Left Shoulder External Rotation 60 Degrees                     [facility] Adult PT Treatment/Exercise - 03/11/17 0001      Shoulder Exercises: Supine   External Rotation AAROM;Left;10 reps  different angles of abd.  with no pain   Flexion AAROM;Both;20 reps     Shoulder Exercises: Seated   Flexion Strengthening;Right;10 reps  tactile cues to relax upper trap     Shoulder Exercises: Standing   External Rotation 10 reps   Theraband Level (Shoulder External Rotation) Level 3 (Green)   Internal Rotation 20 reps;Left;Strengthening   Theraband Level (Shoulder Internal Rotation) Level 3 (Green)   Flexion AAROM;Left;20 reps   Flexion Limitations ue RANGER with therapist helping the scapula trac and Thoracic vertebrae moving correctly   ABduction AAROM;Strengthening;Left;15 reps;Limitations   ABduction Limitations therapist guiding scapula and assisting the movement of the thoracic spine     Shoulder Exercises: ROM/Strengthening   UBE (Upper Arm Bike) L2, 2' forward/2' backward    Rhythmic Stabilization, Supine LUE at 90 deg, 4x20 sec external perturbations at wrist level                   PT Short Term Goals - 02/01/17 1638       PT SHORT TERM GOAL #1   Title pt will feel more steady when getting out of bed due to increased strength and body awareness   Time 4   Period Weeks   Status Achieved     PT SHORT TERM GOAL #2   Title independent with initial HEP   Baseline 30% consistent with HEP    Time 4   Period Weeks   Status Partially Met     PT SHORT TERM GOAL #3   Title improved PROM to 120 deg flexion and 90 deg external rotation of left shoulder   Baseline only about 25 deg at 90 deg abduction   Time 4   Period Weeks   Status Partially Met     PT SHORT TERM GOAL #4   Title left shoulder strength improved to be able to peform AROM flexion and abduction to 90 degrees for improved functional lifting activities   Baseline 80 deg    Time 4   Period Weeks   Status Partially Met     PT SHORT TERM GOAL #5   Title pain reduced by 25%   Baseline 80% improvement in pain    Time 4   Period Weeks   Status Achieved           PT Long Term Goals - 02/01/17 1641      PT LONG TERM GOAL #1   Title patient able to get dressed by herself due to improved strength and ROM   Baseline pt feels this is improved, but she is still unable to fasten her bra   Time 8   Period Weeks   Status Partially Met     PT LONG TERM GOAL #2   Title Pt able to perform overhead activities such as putting things in her cabinets due to improved strength and AROM   Baseline unable with Lt    Time 8   Period Weeks   Status Not Met     PT LONG TERM GOAL #3   Title independent with advanced HEP   Time 8   Period Weeks   Status On-going     PT LONG TERM GOAL #4   Title FOTO < or = to 44% limited   Time 8   Period Weeks   Status On-going     PT LONG TERM GOAL #5   Title pain reduced by 60% or more   Baseline pain and symptoms improved 80%   Time 8  Period Weeks   Status Achieved  8.15.18: 5/10 pain at worst.  25% reduction in pain                Plan - 03/11/17 1102    Clinical Impression Statement  Patient will sidebend her head to the left and increase left upper trap contraction with left shoulder flexion and abduction.  Patient needs tactile cues and verbal cues to correct the compensation.  Patient has decreased scapula depressor strength.  Patient is tight in the levator scapulae and upper trap on the left.  Patient has a tender spot on the left shoulder that started several days ago.  Patient will benefit from skilled therapy to improve muscle coordination, increase strength and ROM.     Rehab Potential Excellent   PT Frequency 2x / week   PT Duration 8 weeks   PT Treatment/Interventions ADLs/Self Care Home Management;Biofeedback;Cryotherapy;Electrical Stimulation;Iontophoresis 3m/ml Dexamethasone;Moist Heat;Ultrasound;Functional mobility training;Therapeutic activities;Therapeutic exercise;Neuromuscular re-education;Passive range of motion;Patient/family education;Dry needling;Taping;Manual techniques   PT Next Visit Plan ROM measurement; reclined AAROM as well as shoulder and scapular strengthening progressions; rhythmic stabilization and scapular neuromuscular control; KX modifier; next g-code in 2 visits   PT Home Exercise Plan progress as needed   Recommended Other Services renewal note signed by MD   Consulted and Agree with Plan of Care Patient      Patient will benefit from skilled therapeutic intervention in order to improve the following deficits and impairments:  Decreased activity tolerance, Decreased mobility, Decreased range of motion, Decreased strength, Impaired UE functional use, Pain, Increased muscle spasms, Impaired flexibility  Visit Diagnosis: Muscle weakness (generalized)  Acute pain of left shoulder  Abnormal posture     Problem List Patient Active Problem List   Diagnosis Date Noted  . Primary osteoarthritis of left knee 03/10/2017  . Trochanteric bursitis of right hip 03/10/2017  . Anterior dislocation of left shoulder 12/20/2016  . Closed fracture  of proximal end of left humerus with routine healing 12/20/2016  . Dyspnea on exertion 07/27/2016  . RBBB 03/04/2016  . OSA on CPAP 03/04/2016  . ILD (interstitial lung disease) ([place]  c/w NSIP p macrodantin exp 02/11/2016  . Morbid obesity due to excess calories ([place] 02/11/2016  . Chronic respiratory failure with hypoxia ([place] 01/07/2016  . Upper airway cough syndrome 12/06/2015  . COPD GOLD 0 12/05/2015  . Acute cholecystitis 07/19/2014    CEarlie Counts PT 03/11/17 11:10 AM   [facility] [address] [place] [address]: [phone]  Fax:  [phone] Name: JNarely NoblesMRN: [id number] of Birth: 9April 18, 1952

## 2017-03-16 ENCOUNTER — Ambulatory Visit: Payer: Medicare Other | Admitting: Physical Therapy

## 2017-03-16 ENCOUNTER — Encounter: Payer: Self-pay | Admitting: Physical Therapy

## 2017-03-16 DIAGNOSIS — M6281 Muscle weakness (generalized): Secondary | ICD-10-CM

## 2017-03-16 DIAGNOSIS — R293 Abnormal posture: Secondary | ICD-10-CM

## 2017-03-16 DIAGNOSIS — M25512 Pain in left shoulder: Secondary | ICD-10-CM

## 2017-03-16 NOTE — Therapy (Signed)
[facility] [address], [place], [place], [address] Phone: [phone]   Fax:  [phone]  Physical Therapy Treatment  Patient Details  Name: Lindsay Brady MRN: [id number] Date of Birth: [birth year] Referring Provider: Meridee Score, MD   Encounter Date: 03/16/2017      PT End of Session - 03/16/17 1619    Visit Number 17   Number of Visits 17   Date for PT Re-Evaluation 03/30/17   Authorization Type Medicare A and B (G-codes updated on 2022/07/16 visit; Next g-code is to be done on the 18th visit   Authorization Time Period 02/02/17 to 03/30/17   Authorization - Visit Number 10   Authorization - Number of Visits 10   PT Start Time 3818   PT Stop Time 2993   PT Time Calculation (min) 42 min   Activity Tolerance Patient tolerated treatment well;No increased pain   Behavior During Therapy WFL for tasks assessed/performed      Past Medical History:  Diagnosis Date  . Anxiety   . Arthritis   . Cholecystitis 07/2014  . COPD (chronic obstructive pulmonary disease) ([place])   . Diabetes mellitus without complication ([place])    type 2   . Fracture 2013 ?   left leg  . GERD (gastroesophageal reflux disease)    hx of gerd  . Headache   . Hx: UTI (urinary tract infection)   . OA (osteoarthritis) of knee   . Pneumonia    hx of viral pna years ago  . Shortness of breath dyspnea   . Sleep apnea    uses cpap  . Urgency of urination     Past Surgical History:  Procedure Laterality Date  . ABDOMINAL HYSTERECTOMY    . CARPAL TUNNEL RELEASE    . CHOLECYSTECTOMY N/A 07/19/2014   Procedure: LAPAROSCOPIC CHOLECYSTECTOMY WITH INTRAOPERATIVE CHOLANGIOGRAM;  Surgeon: Georganna Skeans, MD;  Location: [facility];  Service: General;  Laterality: N/A;  . COLONOSCOPY  ?   2011  . KNEE ARTHROSCOPY    . TONSILLECTOMY      There were no vitals filed for this visit.      Subjective Assessment - 03/16/17 1657    Subjective I feel a little sore  on the side of my shoulder since yesterday.  I tried really hard to put my bra on and that may have aggravated it.   Patient Stated Goals have it back before it started   Currently in Pain? Yes   Pain Score 2    Pain Location Shoulder   Pain Orientation Left   Pain Descriptors / Indicators Aching   Pain Type Acute pain   Pain Onset More than a month ago   Pain Frequency Intermittent   Aggravating Factors  putting bra on   Pain Relieving Factors rest   Multiple Pain Sites No            [facility] PT Assessment - 03/16/17 0001      AROM   Left Shoulder Flexion 128 Degrees   Left Shoulder ABduction 114 Degrees                     [facility] Adult PT Treatment/Exercise - 03/16/17 0001      Neuro Re-ed    Neuro Re-ed Details  cues for scapular movements and stability throughout exercises     Shoulder Exercises: Seated   Flexion Strengthening;Right;10 reps;AROM  to shoulder height, tactile cues to relax upper trap   Abduction  AROM;Strengthening;Right;Left;10 reps  to shoulder height, cues to keep shoulder     Shoulder Exercises: Standing   External Rotation 10 reps   Theraband Level (Shoulder External Rotation) Level 3 (Green)   Internal Rotation 20 reps;Left;Strengthening   Theraband Level (Shoulder Internal Rotation) Level 3 (Green)   ABduction AAROM;Strengthening;Left;Limitations;10 reps  5 sec hold; cues to keep shoulder down   Extension Both;Strengthening;Theraband;20 reps   Theraband Level (Shoulder Extension) Level 3 (Green)   Other Standing Exercises LUE finger ladder x10 reps, 5 sec hold end range stretch  flexion (L20)     Shoulder Exercises: Pulleys   Flexion 3 minutes   ABduction 3 minutes  cues to decrease trunk lean compensation     Shoulder Exercises: ROM/Strengthening   UBE (Upper Arm Bike) L2, 2' forward/2' backward    Rhythmic Stabilization, Supine LUE at 90 deg, 4x20 sec external perturbations at wrist level                   PT  Short Term Goals - 02/01/17 1638      PT SHORT TERM GOAL #1   Title pt will feel more steady when getting out of bed due to increased strength and body awareness   Time 4   Period Weeks   Status Achieved     PT SHORT TERM GOAL #2   Title independent with initial HEP   Baseline 30% consistent with HEP    Time 4   Period Weeks   Status Partially Met     PT SHORT TERM GOAL #3   Title improved PROM to 120 deg flexion and 90 deg external rotation of left shoulder   Baseline only about 25 deg at 90 deg abduction   Time 4   Period Weeks   Status Partially Met     PT SHORT TERM GOAL #4   Title left shoulder strength improved to be able to peform AROM flexion and abduction to 90 degrees for improved functional lifting activities   Baseline 80 deg    Time 4   Period Weeks   Status Partially Met     PT SHORT TERM GOAL #5   Title pain reduced by 25%   Baseline 80% improvement in pain    Time 4   Period Weeks   Status Achieved           PT Long Term Goals - 02/01/17 1641      PT LONG TERM GOAL #1   Title patient able to get dressed by herself due to improved strength and ROM   Baseline pt feels this is improved, but she is still unable to fasten her bra   Time 8   Period Weeks   Status Partially Met     PT LONG TERM GOAL #2   Title Pt able to perform overhead activities such as putting things in her cabinets due to improved strength and AROM   Baseline unable with Lt    Time 8   Period Weeks   Status Not Met     PT LONG TERM GOAL #3   Title independent with advanced HEP   Time 8   Period Weeks   Status On-going     PT LONG TERM GOAL #4   Title FOTO < or = to 44% limited   Time 8   Period Weeks   Status On-going     PT LONG TERM GOAL #5   Title pain reduced by 60% or more  Baseline pain and symptoms improved 80%   Time 8   Period Weeks   Status Achieved  8.15.18: 5/10 pain at worst.  25% reduction in pain                Plan - 03/16/17 1620     Clinical Impression Statement Patient did well and had improved AROM of 128 deg flexion and 114 abduction.  Pt continues to need cues to prevent elevating left scapula with movements.  She does well with verbal and tactile cues.  Patient was given green band to progress her HEP.  Pt will benefit from skilled PT in order to improve ROM and functional overhead activities.   Rehab Potential Excellent   PT Treatment/Interventions ADLs/Self Care Home Management;Biofeedback;Cryotherapy;Electrical Stimulation;Iontophoresis 31m/ml Dexamethasone;Moist Heat;Ultrasound;Functional mobility training;Therapeutic activities;Therapeutic exercise;Neuromuscular re-education;Passive range of motion;Patient/family education;Dry needling;Taping;Manual techniques   PT Next Visit Plan ROM measurement; reclined AAROM as well as shoulder and scapular strengthening progressions; rhythmic stabilization and scapular neuromuscular control; KX modifier; next visit g-code   PT Home Exercise Plan progress as needed   Consulted and Agree with Plan of Care Patient      Patient will benefit from skilled therapeutic intervention in order to improve the following deficits and impairments:  Decreased activity tolerance, Decreased mobility, Decreased range of motion, Decreased strength, Impaired UE functional use, Pain, Increased muscle spasms, Impaired flexibility  Visit Diagnosis: Muscle weakness (generalized)  Acute pain of left shoulder  Abnormal posture     Problem List Patient Active Problem List   Diagnosis Date Noted  . Primary osteoarthritis of left knee 03/10/2017  . Trochanteric bursitis of right hip 03/10/2017  . Anterior dislocation of left shoulder 12/20/2016  . Closed fracture of proximal end of left humerus with routine healing 12/20/2016  . Dyspnea on exertion 07/27/2016  . RBBB 03/04/2016  . OSA on CPAP 03/04/2016  . IL[place] (interstitial lung disease) ([place]  c/w NSIP p macrodantin exp 02/11/2016  . Morbid  obesity due to excess calories ([place] 02/11/2016  . Chronic respiratory failure with hypoxia ([place] 01/07/2016  . Upper airway cough syndrome 12/06/2015  . COPD GOLD 0 12/05/2015  . Acute cholecystitis 07/19/2014    JZannie Cove PT 03/16/2017, 5:10 PM  [facility] [address] [place] [address]Phone: [phone]  Fax:  [phone] Name: JMason BurleighMRN: [id number] of Birth: [birth year]

## 2017-03-18 ENCOUNTER — Telehealth (INDEPENDENT_AMBULATORY_CARE_PROVIDER_SITE_OTHER): Payer: Self-pay | Admitting: Radiology

## 2017-03-18 NOTE — Telephone Encounter (Signed)
Information submitted to [employer] provider portal for left knee injection. Pending VOB.

## 2017-03-21 ENCOUNTER — Ambulatory Visit: Payer: Medicare Other | Admitting: Physical Therapy

## 2017-03-21 ENCOUNTER — Telehealth: Payer: Self-pay | Admitting: Physical Therapy

## 2017-03-21 NOTE — Telephone Encounter (Signed)
Called patient about her 11:45  Appointment and her not showing.  Left a message to call back.  Eulis FosterCheryl Tim Corriher, PT @10 /22/2018@ 12:02 PM

## 2017-03-23 ENCOUNTER — Encounter: Payer: Self-pay | Admitting: Physical Therapy

## 2017-03-23 ENCOUNTER — Ambulatory Visit: Payer: Medicare Other | Admitting: Physical Therapy

## 2017-03-23 DIAGNOSIS — M6281 Muscle weakness (generalized): Secondary | ICD-10-CM

## 2017-03-23 DIAGNOSIS — M25512 Pain in left shoulder: Secondary | ICD-10-CM

## 2017-03-23 DIAGNOSIS — R293 Abnormal posture: Secondary | ICD-10-CM

## 2017-03-23 NOTE — Therapy (Addendum)
[facility] [address], [address] [place], [place], [address] Phone: [phone]   Fax:  [phone]  Physical Therapy Treatment  Patient Details  Name: Lindsay Brady MRN: [id number] Date of Birth: 11/06/1950 Referring Provider: Marcus Duda, MD   Encounter Date: 03/23/2017      PT End of Session - 03/23/17 1153    Visit Number 18   Date for PT Re-Evaluation 03/30/17   Authorization Type Medicare A and B (G-codes updated on 8th visit; Next g-code is to be done on the 18th visit   Authorization Time Period 02/02/17 to 03/30/17   PT Start Time 1152   PT Stop Time 1230   PT Time Calculation (min) 38 min   Activity Tolerance Patient tolerated treatment well;No increased pain   Behavior During Therapy WFL for tasks assessed/performed      Past Medical History:  Diagnosis Date  . Anxiety   . Arthritis   . Cholecystitis 07/2014  . COPD (chronic obstructive pulmonary disease) ([facility])   . Diabetes mellitus without complication ([facility])    type 2   . Fracture 2013 ?   left leg  . GERD (gastroesophageal reflux disease)    hx of gerd  . Headache   . Hx: UTI (urinary tract infection)   . OA (osteoarthritis) of knee   . Pneumonia    hx of viral pna years ago  . Shortness of breath dyspnea   . Sleep apnea    uses cpap  . Urgency of urination     Past Surgical History:  Procedure Laterality Date  . ABDOMINAL HYSTERECTOMY    . CARPAL TUNNEL RELEASE    . CHOLECYSTECTOMY N/A 07/19/2014   Procedure: LAPAROSCOPIC CHOLECYSTECTOMY WITH INTRAOPERATIVE CHOLANGIOGRAM;  Surgeon: Burke Thompson, MD;  [facility];  Service: General;  Laterality: N/A;  . COLONOSCOPY  ?   2011  . KNEE ARTHROSCOPY    . TONSILLECTOMY      There were no vitals filed for this visit.      Subjective Assessment - 03/23/17 1154    Subjective I have no O2 today so I need to watch how "hard" I work.    Currently in Pain? Yes   Pain Score 1    Pain Location  Shoulder   Pain Orientation Left   Pain Descriptors / Indicators Dull   Aggravating Factors  Constantly has a low level pain   Pain Relieving Factors rest   Multiple Pain Sites No            [facility] PT Assessment - 03/23/17 0001      Observation/Other Assessments   Focus on Therapeutic Outcomes (FOTO)  55%   CK                     [facility] Adult PT Treatment/Exercise - 03/23/17 0001      Shoulder Exercises: Sidelying   External Rotation --  10x 0#, 2x10 1#     Shoulder Exercises: Standing   External Rotation Strengthening;Left;20 reps;Theraband   Theraband Level (Shoulder External Rotation) Level 3 (Green)   Internal Rotation Strengthening;Left;20 reps;Theraband   Theraband Level (Shoulder Internal Rotation) Level 3 (Green)   ABduction AAROM;Strengthening;Left;5 reps  Up wall, hold arm off wall, then slide down   Extension Strengthening;Both;20 reps;Theraband   Theraband Level (Shoulder Extension) Level 3 (Green)   Other Standing Exercises UE ranger #34 1# on wrist 2x10     Shoulder Exercises: Pulleys   Flexion 3 minutes     ABduction 3 minutes  cues to decrease trunk lean compensation                  PT Short Term Goals - 02/01/17 1638      PT SHORT TERM GOAL #1   Title pt will feel more steady when getting out of bed due to increased strength and body awareness   Time 4   Period Weeks   Status Achieved     PT SHORT TERM GOAL #2   Title independent with initial HEP   Baseline 30% consistent with HEP    Time 4   Period Weeks   Status Partially Met     PT SHORT TERM GOAL #3   Title improved PROM to 120 deg flexion and 90 deg external rotation of left shoulder   Baseline only about 25 deg at 90 deg abduction   Time 4   Period Weeks   Status Partially Met     PT SHORT TERM GOAL #4   Title left shoulder strength improved to be able to peform AROM flexion and abduction to 90 degrees for improved functional lifting activities   Baseline 80  deg    Time 4   Period Weeks   Status Partially Met     PT SHORT TERM GOAL #5   Title pain reduced by 25%   Baseline 80% improvement in pain    Time 4   Period Weeks   Status Achieved           PT Long Term Goals - 02/01/17 1641      PT LONG TERM GOAL #1   Title patient able to get dressed by herself due to improved strength and ROM   Baseline pt feels this is improved, but she is still unable to fasten her bra   Time 8   Period Weeks   Status Partially Met     PT LONG TERM GOAL #2   Title Pt able to perform overhead activities such as putting things in her cabinets due to improved strength and AROM   Baseline unable with Lt    Time 8   Period Weeks   Status Not Met     PT LONG TERM GOAL #3   Title independent with advanced HEP   Time 8   Period Weeks   Status On-going     PT LONG TERM GOAL #4   Title FOTO < or = to 44% limited   Time 8   Period Weeks   Status On-going     PT LONG TERM GOAL #5   Title pain reduced by 60% or more   Baseline pain and symptoms improved 80%   Time 8   Period Weeks   Status Achieved  8.15.18: 5/10 pain at worst.  25% reduction in pain                Plan - 03/23/17 1153    Clinical Impression Statement Pt reports she is pdoing 75% of her ADLS with modifications, 55% without modifications. Since starting PT she reports she is generally 75% improved. Pt struggles with abduction and ER but works hard in doing the movements withthe best mechanics she can.    Rehab Potential Excellent   PT Frequency 2x / week   PT Duration 8 weeks   PT Treatment/Interventions ADLs/Self Care Home Management;Biofeedback;Cryotherapy;Electrical Stimulation;Iontophoresis 21m/ml Dexamethasone;Moist Heat;Ultrasound;Functional mobility training;Therapeutic activities;Therapeutic exercise;Neuromuscular re-education;Passive range of motion;Patient/family education;Dry needling;Taping;Manual techniques   PT Next Visit Plan A/AA/ROM,  scap stabs,  functional use of LTUE.   Consulted and Agree with Plan of Care Patient      Patient will benefit from skilled therapeutic intervention in order to improve the following deficits and impairments:  Decreased activity tolerance, Decreased mobility, Decreased range of motion, Decreased strength, Impaired UE functional use, Pain, Increased muscle spasms, Impaired flexibility  Visit Diagnosis: Muscle weakness (generalized)  Acute pain of left shoulder  Abnormal posture       G-Codes - 03/23/17 1337    Functional Assessment Tool Used (Outpatient Only) clinical impression based on assessment of ROM, strength and UE functional use 55% limitation with FOTO   Functional Limitation Carrying, moving and handling objects   Carrying, Moving and Handling Objects Current Status (G8984) At least 40 percent but less than 60 percent impaired, limited or restricted   Carrying, Moving and Handling Objects Goal Status (G8985) At least 40 percent but less than 60 percent impaired, limited or restricted      Problem List Patient Active Problem List   Diagnosis Date Noted  . Primary osteoarthritis of left knee 03/10/2017  . Trochanteric bursitis of right hip 03/10/2017  . Anterior dislocation of left shoulder 12/20/2016  . Closed fracture of proximal end of left humerus with routine healing 12/20/2016  . Dyspnea on exertion 07/27/2016  . RBBB 03/04/2016  . OSA on CPAP 03/04/2016  . ILD (interstitial lung disease) ([facility])  c/w NSIP p macrodantin exp 02/11/2016  . Morbid obesity due to excess calories ([facility]) 02/11/2016  . Chronic respiratory failure with hypoxia ([facility]) 01/07/2016  . Upper airway cough syndrome 12/06/2015  . COPD GOLD 0 12/05/2015  . Acute cholecystitis 07/19/2014    Cheryl Gray, PT 03/23/17 1:38 PM  Cochran, Jennifer PTA  03/23/17 1:38 PM   [facility] [address], [address] [place], [place], [address] Phone: [phone]    Fax:  [phone]  Name: Forest Ransdell MRN: [id number] Date of Birth: 07/25/1950   

## 2017-03-28 ENCOUNTER — Encounter: Payer: Self-pay | Admitting: Physical Therapy

## 2017-03-28 ENCOUNTER — Ambulatory Visit: Payer: Medicare Other | Admitting: Physical Therapy

## 2017-03-28 DIAGNOSIS — M6281 Muscle weakness (generalized): Secondary | ICD-10-CM

## 2017-03-28 DIAGNOSIS — R293 Abnormal posture: Secondary | ICD-10-CM

## 2017-03-28 DIAGNOSIS — M25512 Pain in left shoulder: Secondary | ICD-10-CM

## 2017-03-28 NOTE — Therapy (Signed)
[facility] [address], [place], [place], [address] Phone: [phone]   Fax:  [phone]  Physical Therapy Treatment  Patient Details  Name: Lindsay Brady MRN: [id number] Date of Birth: 1950/11/30 Referring Provider: Meridee Score, MD   Encounter Date: 03/28/2017      PT End of Session - 03/28/17 1147    Visit Number 19   Date for PT Re-Evaluation 03/30/17   Authorization Type Medicare A and B (G-codes updated on July 22, 2022 visit; Next g-code is to be done on the 28th visit   Authorization Time Period 02/02/17 to 03/30/17   PT Start Time 1145   PT Stop Time 1225   PT Time Calculation (min) 40 min   Activity Tolerance Patient tolerated treatment well;No increased pain   Behavior During Therapy WFL for tasks assessed/performed      Past Medical History:  Diagnosis Date  . Anxiety   . Arthritis   . Cholecystitis 07/2014  . COPD (chronic obstructive pulmonary disease) ([place])   . Diabetes mellitus without complication ([place])    type 2   . Fracture 2013 ?   left leg  . GERD (gastroesophageal reflux disease)    hx of gerd  . Headache   . Hx: UTI (urinary tract infection)   . OA (osteoarthritis) of knee   . Pneumonia    hx of viral pna years ago  . Shortness of breath dyspnea   . Sleep apnea    uses cpap  . Urgency of urination     Past Surgical History:  Procedure Laterality Date  . ABDOMINAL HYSTERECTOMY    . CARPAL TUNNEL RELEASE    . CHOLECYSTECTOMY N/A 07/19/2014   Procedure: LAPAROSCOPIC CHOLECYSTECTOMY WITH INTRAOPERATIVE CHOLANGIOGRAM;  Surgeon: Georganna Skeans, MD;  Location: [facility];  Service: General;  Laterality: N/A;  . COLONOSCOPY  ?   2011  . KNEE ARTHROSCOPY    . TONSILLECTOMY      There were no vitals filed for this visit.      Subjective Assessment - 03/28/17 1149    Subjective Stiff this AM. I did a lot of cleaning up for house guests this weekend.    Currently in Pain? Yes   Pain Score 2     Pain Location Shoulder   Pain Orientation Left   Pain Descriptors / Indicators Dull   Multiple Pain Sites No                         [facility] Adult PT Treatment/Exercise - 03/28/17 0001      Shoulder Exercises: Standing   External Rotation Strengthening;Left;20 reps;Theraband   Theraband Level (Shoulder External Rotation) Level 3 (Green)   Internal Rotation Strengthening;Left;20 reps;Theraband   Theraband Level (Shoulder Internal Rotation) Level 3 (Green)   ABduction AAROM;Strengthening;Left;20 reps  Up wall, hold arm off wall, then slide down 2nd 10   Extension Strengthening;Both;20 reps;Theraband   Theraband Level (Shoulder Extension) Level 3 (Green)   Other Standing Exercises UE ranger 10x flexion 10x abd      Shoulder Exercises: Pulleys   Flexion 3 minutes   ABduction 3 minutes  cues to decrease trunk lean compensation     Shoulder Exercises: ROM/Strengthening   UBE (Upper Arm Bike) L2, 2' forward/2' backward                   PT Short Term Goals - 02/01/17 1638      PT SHORT TERM GOAL #1  Title pt will feel more steady when getting out of bed due to increased strength and body awareness   Time 4   Period Weeks   Status Achieved     PT SHORT TERM GOAL #2   Title independent with initial HEP   Baseline 30% consistent with HEP    Time 4   Period Weeks   Status Partially Met     PT SHORT TERM GOAL #3   Title improved PROM to 120 deg flexion and 90 deg external rotation of left shoulder   Baseline only about 25 deg at 90 deg abduction   Time 4   Period Weeks   Status Partially Met     PT SHORT TERM GOAL #4   Title left shoulder strength improved to be able to peform AROM flexion and abduction to 90 degrees for improved functional lifting activities   Baseline 80 deg    Time 4   Period Weeks   Status Partially Met     PT SHORT TERM GOAL #5   Title pain reduced by 25%   Baseline 80% improvement in pain    Time 4   Period Weeks    Status Achieved           PT Long Term Goals - 02/01/17 1641      PT LONG TERM GOAL #1   Title patient able to get dressed by herself due to improved strength and ROM   Baseline pt feels this is improved, but she is still unable to fasten her bra   Time 8   Period Weeks   Status Partially Met     PT LONG TERM GOAL #2   Title Pt able to perform overhead activities such as putting things in her cabinets due to improved strength and AROM   Baseline unable with Lt    Time 8   Period Weeks   Status Not Met     PT LONG TERM GOAL #3   Title independent with advanced HEP   Time 8   Period Weeks   Status On-going     PT LONG TERM GOAL #4   Title FOTO < or = to 44% limited   Time 8   Period Weeks   Status On-going     PT LONG TERM GOAL #5   Title pain reduced by 60% or more   Baseline pain and symptoms improved 80%   Time 8   Period Weeks   Status Achieved  8.15.18: 5/10 pain at worst.  25% reduction in pain                Plan - 03/28/17 1148    Clinical Impression Statement Pt continues to perfrom 75% of her ADLS with modiication. Abduction and ER remain the most difficult motions as she is still tight there although much improved.  Scapular movements seem to be improving with abduction. Pt still requires tactile cuing for abduction to inhibit the upper trap and  get proper glide of scapula.   Rehab Potential Excellent   PT Frequency 2x / week   PT Duration 8 weeks   PT Treatment/Interventions ADLs/Self Care Home Management;Biofeedback;Cryotherapy;Electrical Stimulation;Iontophoresis '4mg'$ /ml Dexamethasone;Moist Heat;Ultrasound;Functional mobility training;Therapeutic activities;Therapeutic exercise;Neuromuscular re-education;Passive range of motion;Patient/family education;Dry needling;Taping;Manual techniques   PT Next Visit Plan Renewal/reassessment next visit. Measure AROM   Consulted and Agree with Plan of Care Patient      Patient will benefit from skilled  therapeutic intervention in order to improve the following deficits  and impairments:  Decreased activity tolerance, Decreased mobility, Decreased range of motion, Decreased strength, Impaired UE functional use, Pain, Increased muscle spasms, Impaired flexibility  Visit Diagnosis: Muscle weakness (generalized)  Acute pain of left shoulder  Abnormal posture     Problem List Patient Active Problem List   Diagnosis Date Noted  . Primary osteoarthritis of left knee 03/10/2017  . Trochanteric bursitis of right hip 03/10/2017  . Anterior dislocation of left shoulder 12/20/2016  . Closed fracture of proximal end of left humerus with routine healing 12/20/2016  . Dyspnea on exertion 07/27/2016  . RBBB 03/04/2016  . OSA on CPAP 03/04/2016  . ILD (interstitial lung disease) ([place])  c/w NSIP p macrodantin exp 02/11/2016  . Morbid obesity due to excess calories ([place]) 02/11/2016  . Chronic respiratory failure with hypoxia ([place]) 01/07/2016  . Upper airway cough syndrome 12/06/2015  . COPD GOLD 0 12/05/2015  . Acute cholecystitis 07/19/2014    Dajha Urquilla, PTA 03/28/2017, 12:26 PM  [facility] [address] [place], [place], [address] Phone: [phone]   Fax:  [phone]  Name: Mallorey Odonell MRN: [id number] Date of Birth: 1950-12-19

## 2017-03-30 ENCOUNTER — Ambulatory Visit: Payer: Medicare Other | Admitting: Physical Therapy

## 2017-03-30 ENCOUNTER — Encounter: Payer: Self-pay | Admitting: Physical Therapy

## 2017-03-30 DIAGNOSIS — M25512 Pain in left shoulder: Secondary | ICD-10-CM

## 2017-03-30 DIAGNOSIS — M6281 Muscle weakness (generalized): Secondary | ICD-10-CM | POA: Diagnosis not present

## 2017-03-30 DIAGNOSIS — R293 Abnormal posture: Secondary | ICD-10-CM

## 2017-03-30 NOTE — Therapy (Signed)
[facility] [address] [place], [place], [address] Phone: [phone]   Fax:  [phone]  Physical Therapy Treatment  Patient Details  Name: Lindsay Brady MRN: [id number] Date of Birth: 1951-02-16 Referring Provider: Dr. Meridee Score  Encounter Date: 03/30/2017      PT End of Session - 03/30/17 1405    Visit Number 20   Number of Visits 28   Date for PT Re-Evaluation 05/25/17   Authorization Type Medicare A and B (G-codes updated on 2022-08-17 visit; Next g-code is to be done on the 28th visit   Authorization Time Period 03/30/17 to 05/25/2017 but would like to do in 4 weeks (04/27/2017)   PT Start Time 1145   PT Stop Time 1230   PT Time Calculation (min) 45 min   Activity Tolerance Patient tolerated treatment well;No increased pain   Behavior During Therapy WFL for tasks assessed/performed      Past Medical History:  Diagnosis Date  . Anxiety   . Arthritis   . Cholecystitis 07/2014  . COPD (chronic obstructive pulmonary disease) ([place])   . Diabetes mellitus without complication ([place])    type 2   . Fracture 2013 ?   left leg  . GERD (gastroesophageal reflux disease)    hx of gerd  . Headache   . Hx: UTI (urinary tract infection)   . OA (osteoarthritis) of knee   . Pneumonia    hx of viral pna years ago  . Shortness of breath dyspnea   . Sleep apnea    uses cpap  . Urgency of urination     Past Surgical History:  Procedure Laterality Date  . ABDOMINAL HYSTERECTOMY    . CARPAL TUNNEL RELEASE    . CHOLECYSTECTOMY N/A 07/19/2014   Procedure: LAPAROSCOPIC CHOLECYSTECTOMY WITH INTRAOPERATIVE CHOLANGIOGRAM;  Surgeon: Georganna Skeans, MD;  Location: [place];  Service: General;  Laterality: N/A;  . COLONOSCOPY  ?   2011  . KNEE ARTHROSCOPY    . TONSILLECTOMY      There were no vitals filed for this visit.      Subjective Assessment - 03/30/17 1155    Subjective I feel pretty good.  I usually wake up with  some pain in the top of the shoulder. I have not had bad pain in awhile. Pain is intermittent.    Limitations Other (comment)  dressing , fixing hair   Patient Stated Goals have it back before it started   Currently in Pain? Yes   Pain Score 1    Pain Location Shoulder   Pain Orientation Left   Pain Descriptors / Indicators Dull;Aching   Pain Type Acute pain   Pain Onset More than a month ago   Pain Frequency Intermittent   Aggravating Factors  movement or position   Pain Relieving Factors rest   Multiple Pain Sites No            [facility] PT Assessment - 03/30/17 0001      Assessment   Medical Diagnosis S43.015A (ICD-10-CM) - Closed anterior dislocation of left shoulder, initial encounter   Referring Provider Dr. Meridee Score   Onset Date/Surgical Date 10/27/16   Prior Therapy No     Precautions   Precautions Shoulder   Type of Shoulder Precautions guided by pain; humeral head fracture and history of dislocation     Restrictions   Weight Bearing Restrictions No     Home Environment   Living Environment Private residence   Living  Arrangements Children     Prior Function   Level of Independence Needs assistance with ADLs;Needs assistance with homemaking;Independent     Cognition   Overall Cognitive Status Within Functional Limits for tasks assessed     Observation/Other Assessments   Focus on Therapeutic Outcomes (FOTO)  55%   CK     Posture/Postural Control   Posture/Postural Control Postural limitations   Postural Limitations Rounded Shoulders     AROM   Left Shoulder Flexion 128 Degrees   Left Shoulder ABduction 114 Degrees   Left Shoulder Internal Rotation 65 Degrees   Left Shoulder External Rotation 55 Degrees     PROM   Left Shoulder Flexion 140 Degrees   Left Shoulder ABduction 120 Degrees   Left Shoulder Internal Rotation 75 Degrees   Left Shoulder External Rotation 58 Degrees   Left Shoulder Horizontal ADduction 15 Degrees     Strength   Left  Shoulder Flexion 3+/5   Left Shoulder ABduction 3+/5   Left Shoulder Internal Rotation 5/5   Left Shoulder External Rotation 4/5   Left Shoulder Horizontal ABduction 3+/5   Left Shoulder Horizontal ADduction 3+/5     Transfers   Transfers Not assessed     Ambulation/Gait   Ambulation/Gait No                     [facility] Adult PT Treatment/Exercise - 03/30/17 0001      Shoulder Exercises: Supine   Flexion Strengthening;Left;10 reps;Weights   Shoulder Flexion Weight (lbs) 2   Other Supine Exercises horizontal adduction to reach across shoulder 10x with end range stretch   Other Supine Exercises lay on back with hands behind head and press elbows into mat     Shoulder Exercises: Pulleys   Flexion 3 minutes   ABduction 3 minutes  cues to decrease trunk lean compensation     Shoulder Exercises: ROM/Strengthening   UBE (Upper Arm Bike) L2, 2' forward/2' backward                 PT Education - 03/30/17 1410    Education provided Yes   Education Details shoulder stretches and strength; emphasised the importance of HEP and how she is to be consistant and do it daily   Person(s) Educated Patient   Methods Explanation;Demonstration;Verbal cues;Handout   Comprehension Verbalized understanding;Returned demonstration          PT Short Term Goals - 02/01/17 1638      PT SHORT TERM GOAL #1   Title pt will feel more steady when getting out of bed due to increased strength and body awareness   Time 4   Period Weeks   Status Achieved     PT SHORT TERM GOAL #2   Title independent with initial HEP   Baseline 30% consistent with HEP    Time 4   Period Weeks   Status Partially Met     PT SHORT TERM GOAL #3   Title improved PROM to 120 deg flexion and 90 deg external rotation of left shoulder   Baseline only about 25 deg at 90 deg abduction   Time 4   Period Weeks   Status Partially Met     PT SHORT TERM GOAL #4   Title left shoulder strength improved to be  able to peform AROM flexion and abduction to 90 degrees for improved functional lifting activities   Baseline 80 deg    Time 4   Period Weeks   Status Partially  Met     PT SHORT TERM GOAL #5   Title pain reduced by 25%   Baseline 80% improvement in pain    Time 4   Period Weeks   Status Achieved           PT Long Term Goals - 03/30/17 1202      PT LONG TERM GOAL #1   Title patient able to get dressed by herself due to improved strength and ROM especially snapping her bra in the back   Baseline trouble putting on bra that hooks in back   Time 8   Period Weeks   Status Revised   Target Date 05/25/17     PT LONG TERM GOAL #2   Title Pt able to perform overhead activities such as putting things in her cabinets due to improved strength and AROM without fear of dropping the item   Baseline can reach but difficult to coordinate to place the item into   Time 8   Period Weeks   Status Revised   Target Date 05/25/17     PT LONG TERM GOAL #3   Title independent with advanced HEP   Baseline still learning as she gains new ROM and strength of left shoulder   Time 8   Period Weeks   Status On-going   Target Date 05/25/17     PT LONG TERM GOAL #4   Title FOTO < or = to 44% limited   Baseline 55% limitation   Time 8   Period Weeks   Status Not Met   Target Date 05/25/17     PT LONG TERM GOAL #5   Title pain reduced by 60% or more   Baseline 90% better   Time 8   Period Weeks   Status Achieved   Target Date 05/25/17     Additional Long Term Goals   Additional Long Term Goals Yes     PT LONG TERM GOAL #6   Title ability to fix her hair overhead due to increased left shoulder flexion and abuction >/= 10 degrees from measurements on 03/30/2017   Time 8   Period Weeks   Status New   Target Date 05/25/17               Plan - 03/30/17 1411    Clinical Impression Statement Patient has difficulty with putting her bra on due to limited IR ro reach behind her  back.  Patient has difficulty coordinating placing an object on overhead shelf with fear of dropping it.  Patient has difficulty fixing her hair due to limited left shoulder ROM and strength above shoulder height.  Patient needs aggrasive joint mobization and overhead strength with correct glenohumeral pattern to perform overhead and behind her back activities.     Rehab Potential Excellent   PT Frequency 2x / week   PT Duration 8 weeks   PT Treatment/Interventions ADLs/Self Care Home Management;Biofeedback;Cryotherapy;Electrical Stimulation;Iontophoresis 31m/ml Dexamethasone;Moist Heat;Ultrasound;Functional mobility training;Therapeutic activities;Therapeutic exercise;Neuromuscular re-education;Passive range of motion;Patient/family education;Dry needling;Taping;Manual techniques   PT Next Visit Plan left shoulder joint mobilization; left scapula mobilization; dry needling to the left shoulder muscles; shoulder strength overhead supine of UE ranger with weight to prevent elevation of shoulder   PT Home Exercise Plan progress as needed   Consulted and Agree with Plan of Care Patient      Patient will benefit from skilled therapeutic intervention in order to improve the following deficits and impairments:  Decreased activity tolerance, Decreased mobility, Decreased range  of motion, Decreased strength, Impaired UE functional use, Pain, Increased muscle spasms, Impaired flexibility  Visit Diagnosis: Muscle weakness (generalized) - Plan: PT plan of care cert/re-cert  Acute pain of left shoulder - Plan: PT plan of care cert/re-cert  Abnormal posture - Plan: PT plan of care cert/re-cert       G-Codes - 04-29-2017 1405    Functional Assessment Tool Used (Outpatient Only) --   Functional Limitation --   Carrying, Moving and Handling Objects Current Status (I7782) --   Carrying, Moving and Handling Objects Goal Status (U2353) --      Problem List Patient Active Problem List   Diagnosis Date  Noted  . Primary osteoarthritis of left knee 03/10/2017  . Trochanteric bursitis of right hip 03/10/2017  . Anterior dislocation of left shoulder 12/20/2016  . Closed fracture of proximal end of left humerus with routine healing 12/20/2016  . Dyspnea on exertion 07/27/2016  . RBBB 03/04/2016  . OSA on CPAP 03/04/2016  . ILD (interstitial lung disease) ([place])  c/w NSIP p macrodantin exp 02/11/2016  . Morbid obesity due to excess calories ([place]) 02/11/2016  . Chronic respiratory failure with hypoxia ([place]) 01/07/2016  . Upper airway cough syndrome 12/06/2015  . COPD GOLD 0 12/05/2015  . Acute cholecystitis 07/19/2014    Earlie Counts, PT 2017/04/29 2:20 PM    [facility] [address], [place], [place], [address] Phone: [phone]   Fax:  [phone]  Name: Lindsay Brady MRN: [id number] Date of Birth: 1951-01-21

## 2017-03-30 NOTE — Patient Instructions (Signed)
Adduction (Passive)    Use other hand to hold left elbow and bring arm across front toward opposite side. Hold __15__ seconds. Repeat __2__ times. Do __2__ sessions per day.  Copyright  VHI. All rights reserved.  Flexion - Supine (Dumbbell)    Lie with arms along body. Lift left arms and shoulders straight up, palms forward. overhead Repeat _15___ times per set. Do __1__ sets per session. Do ____ sessions per week. Use ___2_ lb weights.   Copyright  VHI. All rights reserved.  Shoulder Internal Rotation    Standing, feet shoulder width apart, grasp club with one hand palm forward, arm extended above head, and other hand palm back behind back, arm bent elbow down. Pull gently upward. Hold _30___ seconds. Switch arms and repeat. Repeat _3___ times. Do __2__ sessions per day.  Copyright  VHI. All rights reserved.  Lay on back and place both hand behind head and push elbows to the mat. Hold 15 sec 5 times 2 times per day.  [facility] [address], [address] [place], [place] [address] Phone # [phone] Fax [phone]

## 2017-04-04 ENCOUNTER — Ambulatory Visit: Payer: Medicare Other | Attending: Orthopedic Surgery

## 2017-04-04 DIAGNOSIS — R293 Abnormal posture: Secondary | ICD-10-CM | POA: Diagnosis present

## 2017-04-04 DIAGNOSIS — M6281 Muscle weakness (generalized): Secondary | ICD-10-CM | POA: Insufficient documentation

## 2017-04-04 DIAGNOSIS — M25512 Pain in left shoulder: Secondary | ICD-10-CM | POA: Insufficient documentation

## 2017-04-04 NOTE — Therapy (Signed)
[facility] [address] [place], [place], [address] Phone: [phone]   Fax:  [phone]  Physical Therapy Treatment  Patient Details  Name: Lindsay Brady MRN: [id number] Date of Birth: 1951-01-11 Referring Provider: Dr. Meridee Score   Encounter Date: 04/04/2017  PT End of Session - 04/04/17 1229    Visit Number  21    Number of Visits  28    Date for PT Re-Evaluation  05/25/17    Authorization Type  Medicare A and B (G-codes updated on August 04, 2022 visit; Next g-code is to be done on the 28th visit    PT Start Time  1156 late   late   PT Stop Time  1229    PT Time Calculation (min)  33 min    Activity Tolerance  Patient tolerated treatment well;No increased pain    Behavior During Therapy  WFL for tasks assessed/performed       Past Medical History:  Diagnosis Date  . Anxiety   . Arthritis   . Cholecystitis 07/2014  . COPD (chronic obstructive pulmonary disease) ([place])   . Diabetes mellitus without complication ([place])    type 2   . Fracture 2013 ?   left leg  . GERD (gastroesophageal reflux disease)    hx of gerd  . Headache   . Hx: UTI (urinary tract infection)   . OA (osteoarthritis) of knee   . Pneumonia    hx of viral pna years ago  . Shortness of breath dyspnea   . Sleep apnea    uses cpap  . Urgency of urination     Past Surgical History:  Procedure Laterality Date  . ABDOMINAL HYSTERECTOMY    . CARPAL TUNNEL RELEASE    . COLONOSCOPY  ?   2011  . KNEE ARTHROSCOPY    . TONSILLECTOMY      There were no vitals filed for this visit.  Subjective Assessment - 04/04/17 1158    Subjective  I am doing OK.   A little bit of Lt shoulder pain.      Currently in Pain?  Yes    Pain Score  1     Pain Location  Shoulder    Pain Orientation  Left    Pain Descriptors / Indicators  Dull;Aching    Pain Onset  More than a month ago    Pain Frequency  Intermittent    Aggravating Factors   movement or position     Pain Relieving Factors  rest                      [facility] Adult PT Treatment/Exercise - 04/04/17 0001      Shoulder Exercises: Supine   Flexion  Strengthening;Left;10 reps;Weights    Shoulder Flexion Weight (lbs)  2      Shoulder Exercises: Standing   Other Standing Exercises  UE ranger 10x flexion 10x abd     Other Standing Exercises  cone stack to 2nd shelf: Lt UE 2x1 minute      Shoulder Exercises: Pulleys   Flexion  3 minutes    ABduction  3 minutes cues to decrease trunk lean compensation   cues to decrease trunk lean compensation   Other Pulley Exercises  IR x 1 minute tactile cues for scapular position   tactile cues for scapular position     Shoulder Exercises: ROM/Strengthening   UBE (Upper Arm Bike)  L2, 2' forward/2' backward  PT present to  discuss progress   PT present to discuss progress     Manual Therapy   Manual Therapy  Passive ROM;Joint mobilization    Manual therapy comments  PROM Lt shoulder into IR/ER and abduction with joint mobilization to tolerance to improve joint mobility.                 PT Short Term Goals - 02/01/17 1638      PT SHORT TERM GOAL #1   Title  pt will feel more steady when getting out of bed due to increased strength and body awareness    Time  4    Period  Weeks    Status  Achieved      PT SHORT TERM GOAL #2   Title  independent with initial HEP    Baseline  30% consistent with HEP     Time  4    Period  Weeks    Status  Partially Met      PT SHORT TERM GOAL #3   Title  improved PROM to 120 deg flexion and 90 deg external rotation of left shoulder    Baseline  only about 25 deg at 90 deg abduction    Time  4    Period  Weeks    Status  Partially Met      PT SHORT TERM GOAL #4   Title  left shoulder strength improved to be able to peform AROM flexion and abduction to 90 degrees for improved functional lifting activities    Baseline  80 deg     Time  4    Period  Weeks    Status  Partially Met       PT SHORT TERM GOAL #5   Title  pain reduced by 25%    Baseline  80% improvement in pain     Time  4    Period  Weeks    Status  Achieved        PT Long Term Goals - 03/30/17 1202      PT LONG TERM GOAL #1   Title  patient able to get dressed by herself due to improved strength and ROM especially snapping her bra in the back    Baseline  trouble putting on bra that hooks in back    Time  8    Period  Weeks    Status  Revised    Target Date  05/25/17      PT LONG TERM GOAL #2   Title  Pt able to perform overhead activities such as putting things in her cabinets due to improved strength and AROM without fear of dropping the item    Baseline  can reach but difficult to coordinate to place the item into    Time  8    Period  Weeks    Status  Revised    Target Date  05/25/17      PT LONG TERM GOAL #3   Title  independent with advanced HEP    Baseline  still learning as she gains new ROM and strength of left shoulder    Time  8    Period  Weeks    Status  On-going    Target Date  05/25/17      PT LONG TERM GOAL #4   Title  FOTO < or = to 44% limited    Baseline  55% limitation    Time  8    Period  Weeks  Status  Not Met    Target Date  05/25/17      PT LONG TERM GOAL #5   Title  pain reduced by 60% or more    Baseline  90% better    Time  8    Period  Weeks    Status  Achieved    Target Date  05/25/17      Additional Long Term Goals   Additional Long Term Goals  Yes      PT LONG TERM GOAL #6   Title  ability to fix her hair overhead due to increased left shoulder flexion and abuction >/= 10 degrees from measurements on 03/30/2017    Time  8    Period  Weeks    Status  New    Target Date  05/25/17            Plan - 04/04/17 1201    Clinical Impression Statement  Pt with limited Lt shoulder IR and ability to reach behind the back.  Pt continues to have difficulty coordinating muscles with reaching overhead with Lt shoulder.  Pt will benefit from  skilled PT for flexibility and coordination exercises.      Rehab Potential  Excellent    PT Frequency  2x / week    PT Duration  8 weeks    PT Treatment/Interventions  ADLs/Self Care Home Management;Biofeedback;Cryotherapy;Electrical Stimulation;Iontophoresis 24m/ml Dexamethasone;Moist Heat;Ultrasound;Functional mobility training;Therapeutic activities;Therapeutic exercise;Neuromuscular re-education;Passive range of motion;Patient/family education;Dry needling;Taping;Manual techniques    PT Next Visit Plan  left shoulder joint mobilization; left scapula mobilization; dry needling to the left shoulder muscles; shoulder strength overhead supine of UE ranger with weight to prevent elevation of shoulder    Recommended Other Services  recertification is signed    Consulted and Agree with Plan of Care  Patient       Patient will benefit from skilled therapeutic intervention in order to improve the following deficits and impairments:  Decreased activity tolerance, Decreased mobility, Decreased range of motion, Decreased strength, Impaired UE functional use, Pain, Increased muscle spasms, Impaired flexibility  Visit Diagnosis: Muscle weakness (generalized)  Acute pain of left shoulder  Abnormal posture     Problem List Patient Active Problem List   Diagnosis Date Noted  . Primary osteoarthritis of left knee 03/10/2017  . Trochanteric bursitis of right hip 03/10/2017  . Anterior dislocation of left shoulder 12/20/2016  . Closed fracture of proximal end of left humerus with routine healing 12/20/2016  . Dyspnea on exertion 07/27/2016  . RBBB 03/04/2016  . OSA on CPAP 03/04/2016  . ILD (interstitial lung disease) ([place]  c/w NSIP p macrodantin exp 02/11/2016  . Morbid obesity due to excess calories ([place] 02/11/2016  . Chronic respiratory failure with hypoxia ([place] 01/07/2016  . Upper airway cough syndrome 12/06/2015  . COPD GOLD 0 12/05/2015  . Acute cholecystitis 07/19/2014    KSigurd Sos PT 04/04/17 12:31 PM  [facility] [address] [place] [address]: [phone]  Fax:  [phone] Name: JMargarete HoraceMRN: [id number] of Birth: [birth year]

## 2017-04-07 ENCOUNTER — Ambulatory Visit: Payer: Medicare Other | Admitting: Physical Therapy

## 2017-04-07 DIAGNOSIS — M6281 Muscle weakness (generalized): Secondary | ICD-10-CM | POA: Diagnosis not present

## 2017-04-07 DIAGNOSIS — M25512 Pain in left shoulder: Secondary | ICD-10-CM

## 2017-04-07 DIAGNOSIS — R293 Abnormal posture: Secondary | ICD-10-CM

## 2017-04-07 NOTE — Therapy (Signed)
[facility] [address] [place], [place], [address] Phone: [phone]   Fax:  [phone]  Physical Therapy Treatment  Patient Details  Name: Lindsay Brady MRN: [id number] Date of Birth: 06-02-1950 Referring Provider: Dr. Meridee Score   Encounter Date: 04/07/2017  PT End of Session - 04/07/17 1321    Visit Number  22    Date for PT Re-Evaluation  05/25/17    Authorization Type  Medicare A and B (G-codes updated on 07-28-2022 visit; Next g-code is to be done on the 28th visit    Authorization Time Period  03/30/17 to 05/25/2017 but would like to do in 4 weeks (04/27/2017)    Authorization - Visit Number  22    Authorization - Number of Visits  30    PT Start Time  7290    PT Stop Time  1230    PT Time Calculation (min)  45 min    Activity Tolerance  Patient tolerated treatment well;No increased pain    Behavior During Therapy  WFL for tasks assessed/performed       Past Medical History:  Diagnosis Date  . Anxiety   . Arthritis   . Cholecystitis 07/2014  . COPD (chronic obstructive pulmonary disease) ([place])   . Diabetes mellitus without complication ([place])    type 2   . Fracture 2013 ?   left leg  . GERD (gastroesophageal reflux disease)    hx of gerd  . Headache   . Hx: UTI (urinary tract infection)   . OA (osteoarthritis) of knee   . Pneumonia    hx of viral pna years ago  . Shortness of breath dyspnea   . Sleep apnea    uses cpap  . Urgency of urination     Past Surgical History:  Procedure Laterality Date  . ABDOMINAL HYSTERECTOMY    . CARPAL TUNNEL RELEASE    . COLONOSCOPY  ?   2011  . KNEE ARTHROSCOPY    . TONSILLECTOMY      There were no vitals filed for this visit.  Subjective Assessment - 04/07/17 1154    Subjective  I have ordered the overhead pulleys and theracane.      Patient Stated Goals  have it back before it started    Currently in Pain?  No/denies    Multiple Pain Sites  No          [facility] PT Assessment - 04/07/17 0001      AROM   Left Shoulder Flexion  135 Degrees    Left Shoulder ABduction  125 Degrees                  [facility] Adult PT Treatment/Exercise - 04/07/17 0001      Shoulder Exercises: Standing   Other Standing Exercises  UE ranger 10x flexion 10x abd with therapist guiding scapula; IR/ER at 80 degrees flexion    Other Standing Exercises  cone stack to 2nd shelf: Lt UE 2x1 minute      Shoulder Exercises: Pulleys   Flexion  3 minutes therapist assisting shoulder and scapula    ABduction  3 minutes cues to decrease trunk lean compensation    Other Pulley Exercises  IR x 1 minute tactile cues for scapular position      Shoulder Exercises: ROM/Strengthening   UBE (Upper Arm Bike)  L2, 2' forward/2' backward  PT present to discuss progress      Manual Therapy   Manual Therapy  Soft tissue mobilization;Passive ROM;Joint mobilization    Joint Mobilization  left shoulder for distraction, posterior glide, anterior glide, and posterior glide; PA mobilization  to left upper rib cage    Soft tissue mobilization  left RTC, pectoralis, deltoid, biceps, and rhomboide    Passive ROM  left shoulder Abduction, flexion, ER in different ways of shoulder flexion, ER in 90 degrees abduction               PT Short Term Goals - 04/07/17 1157      PT SHORT TERM GOAL #2   Title  independent with initial HEP    Time  4    Period  Days    Status  Achieved      PT SHORT TERM GOAL #3   Title  improved PROM to 120 deg flexion and 90 deg external rotation of left shoulder    Time  4    Period  Weeks    Status  Achieved      PT SHORT TERM GOAL #4   Title  left shoulder strength improved to be able to peform AROM flexion and abduction to 90 degrees for improved functional lifting activities    Time  4    Period  Weeks    Status  On-going        PT Long Term Goals - 03/30/17 1202      PT LONG TERM GOAL #1   Title  patient able to get  dressed by herself due to improved strength and ROM especially snapping her bra in the back    Baseline  trouble putting on bra that hooks in back    Time  8    Period  Weeks    Status  Revised    Target Date  05/25/17      PT LONG TERM GOAL #2   Title  Pt able to perform overhead activities such as putting things in her cabinets due to improved strength and AROM without fear of dropping the item    Baseline  can reach but difficult to coordinate to place the item into    Time  8    Period  Weeks    Status  Revised    Target Date  05/25/17      PT LONG TERM GOAL #3   Title  independent with advanced HEP    Baseline  still learning as she gains new ROM and strength of left shoulder    Time  8    Period  Weeks    Status  On-going    Target Date  05/25/17      PT LONG TERM GOAL #4   Title  FOTO < or = to 44% limited    Baseline  55% limitation    Time  8    Period  Weeks    Status  Not Met    Target Date  05/25/17      PT LONG TERM GOAL #5   Title  pain reduced by 60% or more    Baseline  90% better    Time  8    Period  Weeks    Status  Achieved    Target Date  05/25/17      Additional Long Term Goals   Additional Long Term Goals  Yes      PT LONG TERM GOAL #6   Title  ability to fix her hair overhead due to increased left shoulder flexion and abuction >/=  10 degrees from measurements on 03/30/2017    Time  8    Period  Weeks    Status  New    Target Date  05/25/17            Plan - 04/07/17 1155    Clinical Impression Statement  Patient has increased left shoulder flexion and abduction since 2 visits ago.  Patient is able to reach to L3 with less pain and greater ease. Patient is able to reach across her body with left arm to right shoulder and not able to do that one week ago.  Patient will benefit from skilled therapy to improve left shoulder ROM and strength to return to overhead activities.    Rehab Potential  Excellent    PT Frequency  2x / week    PT  Duration  8 weeks    PT Treatment/Interventions  ADLs/Self Care Home Management;Biofeedback;Cryotherapy;Electrical Stimulation;Iontophoresis 75m/ml Dexamethasone;Moist Heat;Ultrasound;Functional mobility training;Therapeutic activities;Therapeutic exercise;Neuromuscular re-education;Passive range of motion;Patient/family education;Dry needling;Taping;Manual techniques    PT Next Visit Plan  left shoulder joint mobilization; left scapula mobilization; dry needling to the left shoulder muscles; shoulder strength overhead supine of UE ranger with weight to prevent elevation of shoulder    PT Home Exercise Plan  progress as needed    Consulted and Agree with Plan of Care  Patient       Patient will benefit from skilled therapeutic intervention in order to improve the following deficits and impairments:  Decreased activity tolerance, Decreased mobility, Decreased range of motion, Decreased strength, Impaired UE functional use, Pain, Increased muscle spasms, Impaired flexibility  Visit Diagnosis: Muscle weakness (generalized)  Acute pain of left shoulder  Abnormal posture     Problem List Patient Active Problem List   Diagnosis Date Noted  . Primary osteoarthritis of left knee 03/10/2017  . Trochanteric bursitis of right hip 03/10/2017  . Anterior dislocation of left shoulder 12/20/2016  . Closed fracture of proximal end of left humerus with routine healing 12/20/2016  . Dyspnea on exertion 07/27/2016  . RBBB 03/04/2016  . OSA on CPAP 03/04/2016  . ILD (interstitial lung disease) ([place]  c/w NSIP p macrodantin exp 02/11/2016  . Morbid obesity due to excess calories ([facility] 02/11/2016  . Chronic respiratory failure with hypoxia ([facility] 01/07/2016  . Upper airway cough syndrome 12/06/2015  . COPD GOLD 0 12/05/2015  . Acute cholecystitis 07/19/2014    CEarlie Counts PT 04/07/17 1:26 PM   [facility] [address] [place] [address]Phone: [phone]  Fax:  [phone] Name: Lindsay BaptistaMRN: [id number] of Birth: 901/18/1952

## 2017-04-08 ENCOUNTER — Telehealth (INDEPENDENT_AMBULATORY_CARE_PROVIDER_SITE_OTHER): Payer: Self-pay | Admitting: Radiology

## 2017-04-08 NOTE — Telephone Encounter (Signed)
I will call pt and get her sched

## 2017-04-08 NOTE — Telephone Encounter (Signed)
Can you call to schedule appointment for synvisc one knee injection with Erin?

## 2017-04-12 ENCOUNTER — Encounter: Payer: Self-pay | Admitting: Physical Therapy

## 2017-04-12 ENCOUNTER — Ambulatory Visit: Payer: Medicare Other | Admitting: Physical Therapy

## 2017-04-12 DIAGNOSIS — M6281 Muscle weakness (generalized): Secondary | ICD-10-CM | POA: Diagnosis not present

## 2017-04-12 DIAGNOSIS — R293 Abnormal posture: Secondary | ICD-10-CM

## 2017-04-12 DIAGNOSIS — M25512 Pain in left shoulder: Secondary | ICD-10-CM

## 2017-04-12 NOTE — Therapy (Signed)
[facility] [address] [place], [place], [address] Phone: [phone]   Fax:  [phone]  Physical Therapy Treatment  Patient Details  Name: Lindsay Brady MRN: [id number] Date of Birth: 20-Jul-1950 Referring Provider: Dr. Meridee Score   Encounter Date: 04/12/2017  PT End of Session - 04/12/17 1235    Visit Number  23    Date for PT Re-Evaluation  05/25/17    Authorization Type  Medicare A and B (G-codes updated on August 09, 2022 visit; Next g-code is to be done on the 28th visit    PT Start Time  1230    PT Stop Time  1310    PT Time Calculation (min)  40 min    Activity Tolerance  Patient tolerated treatment well;No increased pain    Behavior During Therapy  WFL for tasks assessed/performed       Past Medical History:  Diagnosis Date  . Anxiety   . Arthritis   . Cholecystitis 07/2014  . COPD (chronic obstructive pulmonary disease) ([place])   . Diabetes mellitus without complication ([place])    type 2   . Fracture 2013 ?   left leg  . GERD (gastroesophageal reflux disease)    hx of gerd  . Headache   . Hx: UTI (urinary tract infection)   . OA (osteoarthritis) of knee   . Pneumonia    hx of viral pna years ago  . Shortness of breath dyspnea   . Sleep apnea    uses cpap  . Urgency of urination     Past Surgical History:  Procedure Laterality Date  . ABDOMINAL HYSTERECTOMY    . CARPAL TUNNEL RELEASE    . COLONOSCOPY  ?   2011  . KNEE ARTHROSCOPY    . TONSILLECTOMY      There were no vitals filed for this visit.  Subjective Assessment - 04/12/17 1234    Subjective  I have my pulleys and been using them but still working on the technique    Limitations  Other (comment) fixing hair,     Patient Stated Goals  have it back before it started    Currently in Pain?  Yes    Pain Score  1     Pain Location  Shoulder    Pain Orientation  Left    Pain Descriptors / Indicators  Aching;Dull    Pain Radiating Towards   none    Pain Onset  More than a month ago    Pain Frequency  Intermittent    Aggravating Factors   movement or position    Pain Relieving Factors  rest         [facility] PT Assessment - 04/12/17 0001      AROM   Left Shoulder Internal Rotation  -- reach to T10 with pulleys    Left Shoulder External Rotation  70 Degrees                  [facility] Adult PT Treatment/Exercise - 04/12/17 0001      Self-Care   Self-Care  Other Self-Care Comments    Other Self-Care Comments   instruction on using the theracane on pressure points on the shoulder      Shoulder Exercises: Seated   Horizontal ABduction  Strengthening;Both;12 reps;Theraband left arm glides on mat    Theraband Level (Shoulder Horizontal ABduction)  Level 1 (Yellow)    External Rotation  Strengthening;AROM;Left;15 reps at 90 degrees abduction    Flexion  Strengthening;AAROM;Both;10 reps;Weights    Flexion Weight (lbs)  1    Flexion Limitations  holding cane    Other Seated Exercises  seated flexion in scaption from 90 degrees and up 10x then work in midrange    Other Seated Exercises  push left arm forward resting on mat with yellow band      Shoulder Exercises: Standing   External Rotation  AAROM;Left;5 reps place arm on the doorjam/PT ant. glide of G-H jt    ABduction  AAROM;Strengthening;Left;10 reps    ABduction Limitations  slide arm up wall then lift off wall      Shoulder Exercises: Pulleys   Flexion  3 minutes therapist assisting shoulder and scapula    ABduction  3 minutes cues to decrease trunk lean compensation    Other Pulley Exercises  IR x 1 minute tactile cues for scapular position               PT Short Term Goals - 04/12/17 1314      PT SHORT TERM GOAL #4   Title  left shoulder strength improved to be able to peform AROM flexion and abduction to 90 degrees for improved functional lifting activities    Time  4    Period  Weeks    Status  Achieved        PT Long Term Goals -  03/30/17 1202      PT LONG TERM GOAL #1   Title  patient able to get dressed by herself due to improved strength and ROM especially snapping her bra in the back    Baseline  trouble putting on bra that hooks in back    Time  8    Period  Weeks    Status  Revised    Target Date  05/25/17      PT LONG TERM GOAL #2   Title  Pt able to perform overhead activities such as putting things in her cabinets due to improved strength and AROM without fear of dropping the item    Baseline  can reach but difficult to coordinate to place the item into    Time  8    Period  Weeks    Status  Revised    Target Date  05/25/17      PT LONG TERM GOAL #3   Title  independent with advanced HEP    Baseline  still learning as she gains new ROM and strength of left shoulder    Time  8    Period  Weeks    Status  On-going    Target Date  05/25/17      PT LONG TERM GOAL #4   Title  FOTO < or = to 44% limited    Baseline  55% limitation    Time  8    Period  Weeks    Status  Not Met    Target Date  05/25/17      PT LONG TERM GOAL #5   Title  pain reduced by 60% or more    Baseline  90% better    Time  8    Period  Weeks    Status  Achieved    Target Date  05/25/17      Additional Long Term Goals   Additional Long Term Goals  Yes      PT LONG TERM GOAL #6   Title  ability to fix her hair overhead due to increased left shoulder flexion  and abuction >/= 10 degrees from measurements on 03/30/2017    Time  8    Period  Weeks    Status  New    Target Date  05/25/17            Plan - 04/12/17 1312    Clinical Impression Statement  Patient has increased left shoulder ROM for flexion and ER.  Patient is able to decrease left upper trap contraction when she focuses with it with overhead movements.  Patient has increased tissue mobility of anterior left shoulder.  Patient able to reach to T10 when she is using the pulleys.  Patient will benefit from skilled therapy to improve left shoulder ROM  and strength to return to overhead activities.     Rehab Potential  Excellent    PT Treatment/Interventions  ADLs/Self Care Home Management;Biofeedback;Cryotherapy;Electrical Stimulation;Iontophoresis 43m/ml Dexamethasone;Moist Heat;Ultrasound;Functional mobility training;Therapeutic activities;Therapeutic exercise;Neuromuscular re-education;Passive range of motion;Patient/family education;Dry needling;Taping;Manual techniques    PT Next Visit Plan  left shoulder joint mobilization; left scapula mobilization;  shoulder strength overhead supine of UE ranger with weight to prevent elevation of shoulder    PT Home Exercise Plan  progress as needed    Consulted and Agree with Plan of Care  Patient       Patient will benefit from skilled therapeutic intervention in order to improve the following deficits and impairments:  Decreased activity tolerance, Decreased mobility, Decreased range of motion, Decreased strength, Impaired UE functional use, Pain, Increased muscle spasms, Impaired flexibility  Visit Diagnosis: Muscle weakness (generalized)  Acute pain of left shoulder  Abnormal posture     Problem List Patient Active Problem List   Diagnosis Date Noted  . Primary osteoarthritis of left knee 03/10/2017  . Trochanteric bursitis of right hip 03/10/2017  . Anterior dislocation of left shoulder 12/20/2016  . Closed fracture of proximal end of left humerus with routine healing 12/20/2016  . Dyspnea on exertion 07/27/2016  . RBBB 03/04/2016  . OSA on CPAP 03/04/2016  . ILD (interstitial lung disease) ([place]  c/w NSIP p macrodantin exp 02/11/2016  . Morbid obesity due to excess calories ([facility] 02/11/2016  . Chronic respiratory failure with hypoxia ([facility] 01/07/2016  . Upper airway cough syndrome 12/06/2015  . COPD GOLD 0 12/05/2015  . Acute cholecystitis 07/19/2014    CEarlie Counts PT 04/12/17 1:16 PM   [facility] [address]  [place] [address]Phone: [phone]  Fax:  [phone] Name: Lindsay MccubbinMRN: [id number] of Birth: [birth year]

## 2017-04-14 ENCOUNTER — Encounter: Payer: Self-pay | Admitting: Physical Therapy

## 2017-04-14 ENCOUNTER — Ambulatory Visit: Payer: Medicare Other | Admitting: Physical Therapy

## 2017-04-14 DIAGNOSIS — M6281 Muscle weakness (generalized): Secondary | ICD-10-CM

## 2017-04-14 DIAGNOSIS — M25512 Pain in left shoulder: Secondary | ICD-10-CM

## 2017-04-14 DIAGNOSIS — R293 Abnormal posture: Secondary | ICD-10-CM

## 2017-04-14 NOTE — Therapy (Signed)
[facility] [address], [place], [place], [address] Phone: [phone]   Fax:  [phone]  Physical Therapy Treatment  Patient Details  Name: Lindsay Brady MRN: [id number] Date of Birth: [birth year] Referring Provider: Dr. Meridee Score   Encounter Date: 04/14/2017  PT End of Session - 04/14/17 1159    Visit Number  24    Date for PT Re-Evaluation  05/25/17    Authorization Type  Medicare A and B (G-codes updated on 2022/07/19 visit; Next g-code is to be done on the 28th visit    PT Start Time  1153 came late    PT Stop Time  1231    PT Time Calculation (min)  38 min    Activity Tolerance  Patient tolerated treatment well;No increased pain    Behavior During Therapy  WFL for tasks assessed/performed       Past Medical History:  Diagnosis Date  . Anxiety   . Arthritis   . Cholecystitis 07/2014  . COPD (chronic obstructive pulmonary disease) ([place])   . Diabetes mellitus without complication ([place])    type 2   . Fracture 2013 ?   left leg  . GERD (gastroesophageal reflux disease)    hx of gerd  . Headache   . Hx: UTI (urinary tract infection)   . OA (osteoarthritis) of knee   . Pneumonia    hx of viral pna years ago  . Shortness of breath dyspnea   . Sleep apnea    uses cpap  . Urgency of urination     Past Surgical History:  Procedure Laterality Date  . ABDOMINAL HYSTERECTOMY    . CARPAL TUNNEL RELEASE    . CHOLECYSTECTOMY N/A 07/19/2014   Procedure: LAPAROSCOPIC CHOLECYSTECTOMY WITH INTRAOPERATIVE CHOLANGIOGRAM;  Surgeon: Georganna Skeans, MD;  Location: [facility];  Service: General;  Laterality: N/A;  . COLONOSCOPY  ?   2011  . KNEE ARTHROSCOPY    . TONSILLECTOMY      There were no vitals filed for this visit.  Subjective Assessment - 04/14/17 1157    Subjective  I had more pain due to working the shoulder.     Patient Stated Goals  have it back before it started    Currently in Pain?  Yes    Pain Score  2      Pain Location  Shoulder    Pain Orientation  Left    Pain Descriptors / Indicators  Aching;Dull    Pain Type  Acute pain    Pain Onset  More than a month ago    Pain Frequency  Intermittent    Aggravating Factors   movement or position    Pain Relieving Factors  rest    Multiple Pain Sites  No                      [facility] Adult PT Treatment/Exercise - 04/14/17 0001      Shoulder Exercises: Seated   Horizontal ABduction  Strengthening;Both;12 reps;Theraband left arm glides on mat    Theraband Level (Shoulder Horizontal ABduction)  -- manual resistance    External Rotation  Strengthening;AROM;Left;15 reps at 90 degrees abduction; 1# 15x    Internal Rotation  Strengthening;Left;20 reps;Theraband    Theraband Level (Shoulder Internal Rotation)  Level 1 (Yellow)    Flexion  Strengthening;AAROM;Both;10 reps;Weights    Flexion Weight (lbs)  1    Other Seated Exercises  seated flexion in scaption from 90 degrees and  up 10x then work in midrange      Shoulder Exercises: Standing   Flexion  Strengthening;Both;15 reps    Flexion Limitations  roll ball up wall hold 5 sec    ABduction  AAROM;Strengthening;Left;10 reps    ABduction Limitations  face wall and slide out ward    Other Standing Exercises  lat bar 20# 2x10      Shoulder Exercises: Pulleys   Flexion  3 minutes therapist assisting shoulder and scapula    ABduction  3 minutes cues to decrease trunk lean compensation    Other Pulley Exercises  IR x 1 minute tactile cues for scapular position               PT Short Term Goals - 04/12/17 1314      PT SHORT TERM GOAL #4   Title  left shoulder strength improved to be able to peform AROM flexion and abduction to 90 degrees for improved functional lifting activities    Time  4    Period  Weeks    Status  Achieved        PT Long Term Goals - 03/30/17 1202      PT LONG TERM GOAL #1   Title  patient able to get dressed by herself due to improved strength  and ROM especially snapping her bra in the back    Baseline  trouble putting on bra that hooks in back    Time  8    Period  Weeks    Status  Revised    Target Date  05/25/17      PT LONG TERM GOAL #2   Title  Pt able to perform overhead activities such as putting things in her cabinets due to improved strength and AROM without fear of dropping the item    Baseline  can reach but difficult to coordinate to place the item into    Time  8    Period  Weeks    Status  Revised    Target Date  05/25/17      PT LONG TERM GOAL #3   Title  independent with advanced HEP    Baseline  still learning as she gains new ROM and strength of left shoulder    Time  8    Period  Weeks    Status  On-going    Target Date  05/25/17      PT LONG TERM GOAL #4   Title  FOTO < or = to 44% limited    Baseline  55% limitation    Time  8    Period  Weeks    Status  Not Met    Target Date  05/25/17      PT LONG TERM GOAL #5   Title  pain reduced by 60% or more    Baseline  90% better    Time  8    Period  Weeks    Status  Achieved    Target Date  05/25/17      Additional Long Term Goals   Additional Long Term Goals  Yes      PT LONG TERM GOAL #6   Title  ability to fix her hair overhead due to increased left shoulder flexion and abuction >/= 10 degrees from measurements on 03/30/2017    Time  8    Period  Weeks    Status  New    Target Date  05/25/17  Plan - 04/14/17 1227    Clinical Impression Statement  Patient is using more weight with her left arm to strengthen.  Patient is able to reach overhead for the lat bar. Patient is able to keep her shoulder down. Patient has increased AROM of left shoulder.  Patient will benefit from skilled therapy to improve left shoulder ROM and strength to return to overhead activities.     Rehab Potential  Excellent    PT Frequency  2x / week    PT Duration  8 weeks    PT Treatment/Interventions  ADLs/Self Care Home  Management;Biofeedback;Cryotherapy;Electrical Stimulation;Iontophoresis 71m/ml Dexamethasone;Moist Heat;Ultrasound;Functional mobility training;Therapeutic activities;Therapeutic exercise;Neuromuscular re-education;Passive range of motion;Patient/family education;Dry needling;Taping;Manual techniques    PT Next Visit Plan  left shoulder joint mobilization; left scapula mobilization;  shoulder strength overhead supine of UE ranger with weight to prevent elevation of shoulder    PT Home Exercise Plan  progress as needed    Consulted and Agree with Plan of Care  Patient       Patient will benefit from skilled therapeutic intervention in order to improve the following deficits and impairments:  Decreased activity tolerance, Decreased mobility, Decreased range of motion, Decreased strength, Impaired UE functional use, Pain, Increased muscle spasms, Impaired flexibility  Visit Diagnosis: Muscle weakness (generalized)  Acute pain of left shoulder  Abnormal posture     Problem List Patient Active Problem List   Diagnosis Date Noted  . Primary osteoarthritis of left knee 03/10/2017  . Trochanteric bursitis of right hip 03/10/2017  . Anterior dislocation of left shoulder 12/20/2016  . Closed fracture of proximal end of left humerus with routine healing 12/20/2016  . Dyspnea on exertion 07/27/2016  . RBBB 03/04/2016  . OSA on CPAP 03/04/2016  . ILD (interstitial lung disease) ([place]  c/w NSIP p macrodantin exp 02/11/2016  . Morbid obesity due to excess calories ([place] 02/11/2016  . Chronic respiratory failure with hypoxia ([place] 01/07/2016  . Upper airway cough syndrome 12/06/2015  . COPD GOLD 0 12/05/2015  . Acute cholecystitis 07/19/2014    CEarlie Counts PT 04/14/17 12:31 PM   [facility] [address] [place] [address]Phone: [phone]  Fax:  [phone] Name: JCordella NyquistMRN: [id number] of Birth:  [birth year]

## 2017-04-18 ENCOUNTER — Encounter: Payer: Self-pay | Admitting: Physical Therapy

## 2017-04-18 ENCOUNTER — Ambulatory Visit: Payer: Medicare Other | Admitting: Physical Therapy

## 2017-04-18 DIAGNOSIS — M6281 Muscle weakness (generalized): Secondary | ICD-10-CM

## 2017-04-18 DIAGNOSIS — R293 Abnormal posture: Secondary | ICD-10-CM

## 2017-04-18 DIAGNOSIS — M25512 Pain in left shoulder: Secondary | ICD-10-CM

## 2017-04-18 NOTE — Therapy (Signed)
[facility] [address], [address] [place], [place], [address] Phone: [phone]   Fax:  [phone]  Physical Therapy Treatment  Patient Details  Name: Lindsay ColaceJudy Kulkarni MRN: [id number] Date of Birth: Oct 03, 1950 Referring Provider: Dr. Aldean BakerMarcus Duda   Encounter Date: 04/18/2017  PT End of Session - 04/18/17 1226    Visit Number  25    Date for PT Re-Evaluation  05/25/17    Authorization Type  Medicare A and B (G-codes updated on 18th visit; Next g-code is to be done on the 28th visit    PT Start Time  1145    PT Stop Time  1226    PT Time Calculation (min)  41 min    Activity Tolerance  Patient tolerated treatment well;No increased pain    Behavior During Therapy  WFL for tasks assessed/performed       Past Medical History:  Diagnosis Date  . Anxiety   . Arthritis   . Cholecystitis 07/2014  . COPD (chronic obstructive pulmonary disease) (HCC)   . Diabetes mellitus without complication (HCC)    type 2   . Fracture 2013 ?   left leg  . GERD (gastroesophageal reflux disease)    hx of gerd  . Headache   . Hx: UTI (urinary tract infection)   . OA (osteoarthritis) of knee   . Pneumonia    hx of viral pna years ago  . Shortness of breath dyspnea   . Sleep apnea    uses cpap  . Urgency of urination     Past Surgical History:  Procedure Laterality Date  . ABDOMINAL HYSTERECTOMY    . CARPAL TUNNEL RELEASE    . COLONOSCOPY  ?   2011  . KNEE ARTHROSCOPY    . LAPAROSCOPIC CHOLECYSTECTOMY WITH INTRAOPERATIVE CHOLANGIOGRAM N/A 07/19/2014   Performed by Violeta Gelinashompson, [place], [facility]  . TONSILLECTOMY      There were no vitals filed for this visit.  Subjective Assessment - 04/18/17 1159    Subjective  I had increased pain in left shoulder over the weekend and upper arm.     Patient Stated Goals  have it back before it started    Currently in Pain?  Yes    Pain Score  4     Pain Location  Shoulder    Pain Orientation  Left     Pain Descriptors / Indicators  Aching;Dull    Pain Type  Acute pain    Pain Onset  More than a month ago    Pain Frequency  Intermittent    Aggravating Factors   overhead movement    Pain Relieving Factors  rest    Multiple Pain Sites  No                      [facility] Adult PT Treatment/Exercise - 04/18/17 0001      Shoulder Exercises: Seated   Protraction  Strengthening;Left;15 reps;Theraband    Theraband Level (Shoulder Protraction)  Level 1 (Yellow) on table with shoulder at 90 degrees abduction    Horizontal ABduction  Strengthening;Both;12 reps;Theraband left arm glides on mat    External Rotation  Strengthening;AROM;Left;15 reps at 90 degrees abduction;  15x due to flare up    Internal Rotation  Strengthening;Left;20 reps;Theraband    Theraband Level (Shoulder Internal Rotation)  Level 1 (Yellow)      Shoulder Exercises: Standing   Other Standing Exercises  UE ranger 10x flexion 10x abd  with therapist guiding scapula; IR/ER at 80 degrees flexion      Shoulder Exercises: Pulleys   Flexion  3 minutes therapist assisting shoulder and scapula    ABduction  3 minutes cues to decrease trunk lean compensation    Other Pulley Exercises  IR x 1 minute tactile cues for scapular position      Manual Therapy   Manual Therapy  Soft tissue mobilization;Joint mobilization    Joint Mobilization  left shoulder inferior glide, posterior glide, and distraction     Soft tissue mobilization  to left levator ani and upper tra    Passive ROM  left shoulder Abduction, flexion, ER in different ways of shoulder flexion, ER in 90 degrees abduction               PT Short Term Goals - 04/12/17 1314      PT SHORT TERM GOAL #4   Title  left shoulder strength improved to be able to peform AROM flexion and abduction to 90 degrees for improved functional lifting activities    Time  4    Period  Weeks    Status  Achieved        PT Long Term Goals - 04/18/17 1229      PT  LONG TERM GOAL #1   Title  patient able to get dressed by herself due to improved strength and ROM especially snapping her bra in the back    Baseline  trouble putting on bra that hooks in back    Time  8    Period  Weeks    Status  On-going      PT LONG TERM GOAL #2   Title  Pt able to perform overhead activities such as putting things in her cabinets due to improved strength and AROM without fear of dropping the item    Time  8    Period  Weeks    Status  On-going      PT LONG TERM GOAL #3   Title  independent with advanced HEP    Baseline  still learning as she gains new ROM and strength of left shoulder    Time  8    Period  Weeks    Status  On-going      PT LONG TERM GOAL #6   Title  ability to fix her hair overhead due to increased left shoulder flexion and abuction >/= 10 degrees from measurements on 03/30/2017    Time  8    Period  Weeks    Status  On-going            Plan - 04/18/17 1227    Clinical Impression Statement  After therapy patient had no pain in left shoulder.  Patient had a trigger point in left levator scapula muscles.  Patient had a flare-up in left shoulder from using it more, so she had to go slowly with her ROM this visit.  Patient was not able to use as much weight for exercise today.  Patient will benefit from skilled therapy to improve left shoulder ROM and strength to return ot voerhead activities.     Rehab Potential  Excellent    PT Frequency  2x / week    PT Duration  8 weeks    PT Treatment/Interventions  ADLs/Self Care Home Management;Biofeedback;Cryotherapy;Electrical Stimulation;Iontophoresis 4mg /ml Dexamethasone;Moist Heat;Ultrasound;Functional mobility training;Therapeutic activities;Therapeutic exercise;Neuromuscular re-education;Passive range of motion;Patient/family education;Dry needling;Taping;Manual techniques    PT Next Visit Plan  left shoulder joint mobilization;  left scapula mobilization;  shoulder strength overhead supine of UE  ranger with weight to prevent elevation of shoulder    PT Home Exercise Plan  progress as needed    Consulted and Agree with Plan of Care  Patient       Patient will benefit from skilled therapeutic intervention in order to improve the following deficits and impairments:  Decreased activity tolerance, Decreased mobility, Decreased range of motion, Decreased strength, Impaired UE functional use, Pain, Increased muscle spasms, Impaired flexibility  Visit Diagnosis: Muscle weakness (generalized)  Acute pain of left shoulder  Abnormal posture     Problem List Patient Active Problem List   Diagnosis Date Noted  . Primary osteoarthritis of left knee 03/10/2017  . Trochanteric bursitis of right hip 03/10/2017  . Anterior dislocation of left shoulder 12/20/2016  . Closed fracture of proximal end of left humerus with routine healing 12/20/2016  . Dyspnea on exertion 07/27/2016  . RBBB 03/04/2016  . OSA on CPAP 03/04/2016  . ILD (interstitial lung disease) (HCC)  c/w NSIP p macrodantin exp 02/11/2016  . Morbid obesity due to excess calories (HCC) 02/11/2016  . Chronic respiratory failure with hypoxia (HCC) 01/07/2016  . Upper airway cough syndrome 12/06/2015  . COPD GOLD 0 12/05/2015  . Acute cholecystitis 07/19/2014    Eulis Fosterheryl Frida Wahlstrom, PT 04/18/17 12:31 PM   [facility] [address], [address] [place], [place], [address] Phone: [phone]   Fax:  [phone]  Name: Lindsay ColaceJudy Niess MRN: [id number] Date of Birth: July 05, 1950

## 2017-04-25 ENCOUNTER — Encounter: Payer: Medicare Other | Admitting: Physical Therapy

## 2017-04-28 ENCOUNTER — Encounter: Payer: Medicare Other | Admitting: Physical Therapy

## 2017-05-05 ENCOUNTER — Ambulatory Visit: Payer: Medicare Other | Attending: Orthopedic Surgery | Admitting: Physical Therapy

## 2017-05-05 ENCOUNTER — Encounter: Payer: Self-pay | Admitting: Physical Therapy

## 2017-05-05 DIAGNOSIS — M25512 Pain in left shoulder: Secondary | ICD-10-CM | POA: Insufficient documentation

## 2017-05-05 DIAGNOSIS — R293 Abnormal posture: Secondary | ICD-10-CM | POA: Diagnosis present

## 2017-05-05 DIAGNOSIS — M6281 Muscle weakness (generalized): Secondary | ICD-10-CM | POA: Insufficient documentation

## 2017-05-05 NOTE — Patient Instructions (Addendum)
  PNF Strengthening: Resisted   Standing with resistive band around each hand, bring right arm up and away, thumb back. Repeat _10___ times per set. Do _2___ sets per session. Do _1-2___ sessions per day.  Resisted Horizontal Abduction: Bilateral   Sit or stand, tubing in both hands, arms out in front. Keeping arms straight, pinch shoulder blades together and stretch arms out. Repeat _10___ times per set. Do 2____ sets per session. Do _1-2___ sessions per day.  Scapular Retraction: Elbow Flexion (Standing)   With elbows bent to 90, pinch shoulder blades together and rotate arms out, keeping elbows bent. Repeat _10___ times per set. Do _1___ sets per session. Do many____ sessions per day.  Use red band  Strengthening: Resisted Extension   Hold tubing in right hand, arm forward. Pull arm back, elbow straight. Repeat _10___ times per set. Do _2___ sets per session. Do _1-2___ sessions per day.  Can place band around the front of your body and perform with both arms at the same time.  Squeeze shoulder blades    Strengthening: Resisted External Rotation    Hold tubing in right hand, elbow at side and forearm across body. Rotate forearm out. Repeat __20__ times per set. Do _1___ sets per session. Do __1__ sessions per day.  http://orth.exer.us/828   Copyright  VHI. All rights reserved.  Strengthening: Resisted Flexion    Hold tubing with left arm at side. Pull forward and up. Move shoulder through pain-free range of motion. Repeat _10___ times per set. Do __1__ sets per session. Do __1__ sessions per day. Yellow band http://orth.exer.us/824   Copyright  VHI. All rights reserved.  SHOULDER: Abduction Bilateral    Raise arms out and up at same speed with palms up. Keep elbows straight. Do not shrug shoulders. _15__ reps per set, _1__ sets per day, __1_ days per week  When you can touch your hands the move to the yellow band Copyright  VHI. All rights reserved.    Wall Push-A-Way    Face wall. Hands on wall, shoulder height, slightly wider than shoulders, fingers up. Keep body straight (like a board), elbows up; lean into wall. Keep heels on floor. Hold __1_ seconds. Push away, bearing weight on arms. Repeat _10__ times.  Copyright  VHI. All rights reserved.  [facility] [address], [address] [place], [place] [address] Phone # [phone] Fax [phone]

## 2017-05-05 NOTE — Therapy (Signed)
[facility] [address] [place], [place], [address] Phone: [phone]   Fax:  [phone]  Physical Therapy Treatment  Patient Details  Name: Lindsay Brady MRN: [id number] Date of Birth: Apr 28, 1951 Referring Provider: Dr. Meridee Score   Encounter Date: 05/05/2017  PT End of Session - 05/05/17 1155    Visit Number  26    Number of Visits  28    Date for PT Re-Evaluation  05/25/17    Authorization Time Period  03/30/17 to 05/25/2017 but would like to do in 4 weeks (04/27/2017)    Authorization - Visit Number  26    Authorization - Number of Visits  30    PT Start Time  9417    PT Stop Time  1225    PT Time Calculation (min)  40 min    Activity Tolerance  Patient tolerated treatment well;No increased pain    Behavior During Therapy  WFL for tasks assessed/performed       Past Medical History:  Diagnosis Date  . Anxiety   . Arthritis   . Cholecystitis 07/2014  . COPD (chronic obstructive pulmonary disease) ([place])   . Diabetes mellitus without complication ([place])    type 2   . Fracture 2013 ?   left leg  . GERD (gastroesophageal reflux disease)    hx of gerd  . Headache   . Hx: UTI (urinary tract infection)   . OA (osteoarthritis) of knee   . Pneumonia    hx of viral pna years ago  . Shortness of breath dyspnea   . Sleep apnea    uses cpap  . Urgency of urination     Past Surgical History:  Procedure Laterality Date  . ABDOMINAL HYSTERECTOMY    . CARPAL TUNNEL RELEASE    . CHOLECYSTECTOMY N/A 07/19/2014   Procedure: LAPAROSCOPIC CHOLECYSTECTOMY WITH INTRAOPERATIVE CHOLANGIOGRAM;  Surgeon: Georganna Skeans, MD;  Location: [facility];  Service: General;  Laterality: N/A;  . COLONOSCOPY  ?   2011  . KNEE ARTHROSCOPY    . TONSILLECTOMY      There were no vitals filed for this visit.  Subjective Assessment - 05/05/17 1153    Subjective  I was sick for 10 days.  I think I had a virus.     Patient Stated Goals   have it back before it started    Currently in Pain?  Yes    Pain Score  2     Pain Location  Shoulder    Pain Orientation  Left    Pain Descriptors / Indicators  Aching;Dull    Pain Type  Acute pain    Pain Onset  More than a month ago    Pain Frequency  Intermittent    Aggravating Factors   when using the left arm alot    Pain Relieving Factors  rest    Multiple Pain Sites  No         [facility] PT Assessment - 05/05/17 0001      Assessment   Medical Diagnosis  S43.015A (ICD-10-CM) - Closed anterior dislocation of left shoulder, initial encounter    Referring Provider  Dr. Meridee Score    Onset Date/Surgical Date  10/27/16    Hand Dominance  Right    Prior Therapy  No      Restrictions   Weight Bearing Restrictions  No      Home Environment   Living Environment  Private residence  Prior Function   Level of Independence  Independent      Cognition   Overall Cognitive Status  Within Functional Limits for tasks assessed      Observation/Other Assessments   Focus on Therapeutic Outcomes (FOTO)   40%       AROM   Left Shoulder Flexion  140 Degrees    Left Shoulder ABduction  132 Degrees    Left Shoulder Internal Rotation  -- T8    Left Shoulder External Rotation  80 Degrees      Strength   Left Shoulder Flexion  4/5    Left Shoulder ABduction  4/5    Left Shoulder Internal Rotation  5/5    Left Shoulder External Rotation  4/5                  [facility] Adult PT Treatment/Exercise - 05/05/17 0001      Shoulder Exercises: Supine   Flexion  Strengthening;Left;10 reps    Theraband Level (Shoulder Flexion)  Level 2 (Red)      Shoulder Exercises: Pulleys   Flexion  3 minutes therapist assisting shoulder and scapula    ABduction  3 minutes cues to decrease trunk lean compensation    Other Pulley Exercises  IR x 1 minute tactile cues for scapular position             PT Education - 05/05/17 1223    Education provided  Yes    Education Details   shoulder strengthening for HEP    Person(s) Educated  Patient    Methods  Explanation;Demonstration;Handout    Comprehension  Verbalized understanding;Returned demonstration       PT Short Term Goals - 04/12/17 1314      PT SHORT TERM GOAL #4   Title  left shoulder strength improved to be able to peform AROM flexion and abduction to 90 degrees for improved functional lifting activities    Time  4    Period  Weeks    Status  Achieved        PT Long Term Goals - 05/05/17 1228      PT LONG TERM GOAL #1   Title  patient able to get dressed by herself due to improved strength and ROM especially snapping her bra in the back    Time  8    Period  Weeks    Status  Achieved      PT LONG TERM GOAL #2   Title  Pt able to perform overhead activities such as putting things in her cabinets due to improved strength and AROM without fear of dropping the item    Time  8    Period  Weeks    Status  Achieved      PT LONG TERM GOAL #3   Title  independent with advanced HEP    Time  8    Period  Weeks    Status  Achieved      PT LONG TERM GOAL #4   Title  FOTO < or = to 44% limited    Time  8    Period  Weeks    Status  Achieved      PT LONG TERM GOAL #5   Title  pain reduced by 60% or more    Time  8    Period  Weeks    Status  Achieved      PT LONG TERM GOAL #6   Title  ability to fix her  hair overhead due to increased left shoulder flexion and abuction >/= 10 degrees from measurements on 03/30/2017    Time  8    Period  Weeks    Status  Achieved            Plan - 29-May-2017 1207    Clinical Impression Statement  Patient has met her goals. Patient has improved left shoulder ROM so it is functional.  Patient left shoulder strength is 4/5.  Patient is independent with her HEP.  Patient understands how to progress her HEP.  FOTO has improved from 71% limitation to 40% limitation. Patient is ready for discharge.     Rehab Potential  Excellent    PT Treatment/Interventions   ADLs/Self Care Home Management;Biofeedback;Cryotherapy;Electrical Stimulation;Iontophoresis 8m/ml Dexamethasone;Moist Heat;Ultrasound;Functional mobility training;Therapeutic activities;Therapeutic exercise;Neuromuscular re-education;Passive range of motion;Patient/family education;Dry needling;Taping;Manual techniques    PT Next Visit Plan  Discharge to HEP this visit    PT Home Exercise Plan  Current HEP    Consulted and Agree with Plan of Care  Patient       Patient will benefit from skilled therapeutic intervention in order to improve the following deficits and impairments:  Decreased activity tolerance, Decreased mobility, Decreased range of motion, Decreased strength, Impaired UE functional use, Pain, Increased muscle spasms, Impaired flexibility  Visit Diagnosis: Muscle weakness (generalized)  Acute pain of left shoulder  Abnormal posture   G-Codes - [id number]    Functional Assessment Tool Used (Outpatient Only)  clinical impression based on assessment of ROM, strength and UE functional use 40% limitation with FOTO    Functional Limitation  Carrying, moving and handling objects    Carrying, Moving and Handling Objects Goal Status (([id number]  At least 40 percent but less than 60 percent impaired, limited or restricted    Carrying, Moving and Handling Objects Discharge Status ([phone]  At least 20 percent but less than 40 percent impaired, limited or restricted       Problem List Patient Active Problem List   Diagnosis Date Noted  . Primary osteoarthritis of left knee 03/10/2017  . Trochanteric bursitis of right hip 03/10/2017  . Anterior dislocation of left shoulder 12/20/2016  . Closed fracture of proximal end of left humerus with routine healing 12/20/2016  . Dyspnea on exertion 07/27/2016  . RBBB 03/04/2016  . OSA on CPAP 03/04/2016  . ILD (interstitial lung disease) ([place]  c/w NSIP p macrodantin exp 02/11/2016  . Morbid [place] due to excess calories ([place] 02/11/2016   . Chronic respiratory failure with hypoxia ([place] 01/07/2016  . Upper airway cough syndrome 12/06/2015  . COPD GOLD 0 12/05/2015  . Acute cholecystitis 07/19/2014    CEarlie Counts PT 112/30/201812:30 PM   [facility] [address] [place] [address]Phone: [phone]  Fax:  [phone] Name: JGerard BonusMRN: [id number] of Birth: [birth year]PHYSICAL THERAPY DISCHARGE SUMMARY  Visits from Start of Care: 26  Current functional level related to goals / functional outcomes: See above.    Remaining deficits: See above.    Education / Equipment: HEP Plan: Patient agrees to discharge.  Patient goals were met. Patient is being discharged due to meeting the stated rehab goals.  Thank you for the referral. CEarlie Counts PT 112/30/201812:30 PM  ?????

## 2017-07-12 ENCOUNTER — Encounter: Payer: Self-pay | Admitting: Internal Medicine

## 2017-07-12 ENCOUNTER — Ambulatory Visit: Payer: Medicare Other | Admitting: Internal Medicine

## 2017-07-12 ENCOUNTER — Other Ambulatory Visit (INDEPENDENT_AMBULATORY_CARE_PROVIDER_SITE_OTHER): Payer: Medicare Other

## 2017-07-12 VITALS — BP 122/74 | HR 83 | Ht 65.0 in | Wt 262.0 lb

## 2017-07-12 DIAGNOSIS — J849 Interstitial pulmonary disease, unspecified: Secondary | ICD-10-CM | POA: Diagnosis not present

## 2017-07-12 DIAGNOSIS — J9611 Chronic respiratory failure with hypoxia: Secondary | ICD-10-CM | POA: Diagnosis not present

## 2017-07-12 LAB — CBC WITH DIFFERENTIAL/PLATELET
BASOS ABS: 0 10*3/uL (ref 0.0–0.1)
Basophils Relative: 0.4 % (ref 0.0–3.0)
EOS ABS: 0.1 10*3/uL (ref 0.0–0.7)
Eosinophils Relative: 2.2 % (ref 0.0–5.0)
HCT: 38.9 % (ref 36.0–46.0)
Hemoglobin: 13 g/dL (ref 12.0–15.0)
LYMPHS ABS: 2.8 10*3/uL (ref 0.7–4.0)
LYMPHS PCT: 47.6 % — AB (ref 12.0–46.0)
MCHC: 33.4 g/dL (ref 30.0–36.0)
MCV: 94.8 fl (ref 78.0–100.0)
MONOS PCT: 8.4 % (ref 3.0–12.0)
Monocytes Absolute: 0.5 10*3/uL (ref 0.1–1.0)
NEUTROS ABS: 2.4 10*3/uL (ref 1.4–7.7)
NEUTROS PCT: 41.4 % — AB (ref 43.0–77.0)
PLATELETS: 230 10*3/uL (ref 150.0–400.0)
RBC: 4.1 Mil/uL (ref 3.87–5.11)
RDW: 13.6 % (ref 11.5–15.5)
WBC: 5.8 10*3/uL (ref 4.0–10.5)

## 2017-07-12 LAB — SEDIMENTATION RATE: SED RATE: 34 mm/h — AB (ref 0–30)

## 2017-07-12 MED ORDER — ALBUTEROL SULFATE HFA 108 (90 BASE) MCG/ACT IN AERS: 2.0000 | INHALATION_SPRAY | Freq: Four times a day (QID) | RESPIRATORY_TRACT | 1 refills | Status: DC | PRN

## 2017-07-12 NOTE — Patient Instructions (Addendum)
Please remember to go to the lab department downstairs in the basement  for your tests - we will call you with the results when they are available.      No change in medications   Only use your albuterol as a rescue medication to be used if you can't catch your breath by resting or doing a relaxed purse lip breathing pattern.  - The less you use it, the better it will work when you need it. - Ok to use up to 2 puffs  every 4 hours if you must but call for immediate appointment if use goes up over your usual need - Don't leave home without it !!  (think of it like the spare tire for your car)    To get the most out of exercise, you need to be continuously aware that you are short of breath, but never out of breath, for 30 minutes daily on 3lpm . As you improve, it will actually be easier for you to do the same amount of exercise  in  30 minutes so always push to the level where you are short of breath.     Please schedule a follow up visit in 6  months but call sooner if needed

## 2017-07-12 NOTE — Progress Notes (Signed)
Subjective:   Patient ID: Lindsay Brady, female    DOB: 05-Nov-1950    MRN: [id number]    Brief patient profile:  65 yowf quit smoking 1998 at wt around 200 with some am cough and did fine s rx but in 2011 noted doe across parking lot where worked and dx as copd by [employer] and self referred to pulmonary clinic 12/05/2015 as shifting all her care to [facility] with new dx of ILD 02/10/2016 p macrodantin exposure and no evidence of airflow ostruction on symb 160 2bid with poor baseline hfa technique.     History of Present Illness  12/05/2015 1st [facility] office visit/ Saliah Crisp   Chief Complaint  Patient presents with  . Pulmonary Consult    Self referral. Pt states dxed with Emphysema in 2011. She has been seen at [facility] in the past. She c/o hoarseness and increased cough for the past yr.  Cough is prod with white to tan sputum.  She also c/o SOB with walking long distances and up stairs. Hot weather tends to make her breathing worse. She uses ventolin 2 x per day on average.   indolent onset doe x 6 y better on tudorza and spiriva (mouth dry) and no change on symbicort  Needs saba at least twice daily  Already did rehab with wt = ? But did well  Doe = MMRC3 = can't walk 100 yards even at a slow pace at a flat grade s stopping due to sob   Cough p stopped smoking got worse then better and resolved until one year prior to OV  And present daily since but never while on cpap / neg w/u reflux by GI but ent disagreed  rec Plan A = Automatic = symbicort 160 Take 2 puffs first thing in am and then another 2 puffs about 12 hours later / tudorza one twice daily  Plan B = Backup Only use your albuterol as a rescue medication GERD diet  For drainage / throat tickle try take CHLORPHENIRAMINE  4 mg - take one every 4 hours as needed     01/07/16 acute w/in with NP breathing a lot worse  Prednisone taper  Doxycyline x 7 days  Continue symbicort, tudorza  albuterol as needed  Will arrange  home O2  Will check some labs today  Will arrange an echocardiogram and cardiology referral     02/10/2016  Extended f/u ov/Curley Hogen re: COPD 0/ probable ILD ? Macrodantin related ? When last dose ?  Chief Complaint  Patient presents with  . Follow-up    Cough and SOB had improved on pred and doxy and then worsened a few days after finished meds. Cough is prod and mucus gets hung in her throat. She has not needed to use albuterol since starting o2.   doe x MMRC2 = can't walk a nl pace on a flat grade s sob but does fine slow and flat eg  shopping on 02/ main issue is hacking cough and sensation of globus day >> noct rec Call us with the dates you took macrodantin and do not take this again > last rx  12/17/15 x 7 d only   Prednisone 10 mg take  4 each am x 2 days,   2 each am x 2 days,  1 each am x 2 days and stop    03/23/2016  f/u ov/Irma Roulhac re:   ILD / macrodantin exp with high ESR  Chief Complaint  Patient presents with  .  Follow-up    Breathing is doing well. She has been using 2lpm with rest and 3 with exertion. Her cough has improved some but she is still c/o hoarseness.      some better while on prednisone only and worse cough/ sob after stopped  rec Prednisone 10 mg  Take 2 daily until better then reduce to one daily x 2 weeks then one half daily     05/04/2016  f/u ov/Harkirat Orozco re: ILD ? Macrodantin on 5 mg daily  Chief Complaint  Patient presents with  . Follow-up    4 week follow up. Breathing has been ok. Was bothered by allergies around 3 weeks ago with a cough and fatigue.   mmrc = 2 = MMRC2 = can't walk a nl pace on a flat grade s sob but does fine slow and flat eg does fine walking on 3lpm leaning a cart / cough has resolved  rec Please remember to go to the lab  department downstairs for your tests - we will call you with the results when they are available. Schedule High Resolution CT chest and PFT on same day in next two weeks at [facility]  Highest prednisone  dose is 20 mg and the lowest  Is 5 mg  Avoid food that high in fat or carbs  Avoid fried foods/ starchy foods and sweets.  Please schedule a follow up office visit in 6 weeks, call sooner if needed  - consider wean off symbicort as this is not copd   06/01/16 Uri/ doxy 10 > back to baseline     06/15/2016  f/u ov/Kayleah Appleyard re: PF on pred 10 mg daily  X 6 days p taper from 20 on 02 2lpm  / copd 0 on symb 160 2bid  Chief Complaint  Patient presents with  . Follow-up    Pt is following up for ILD.   doe = 3lpm leaning on cart can do HT only = MMRC3 = can't walk 100 yards even at a slow pace at a flat grade s stopping due to sob  / never uses saba prior to ex / can't tell symbicort helps when misses doses Doing recumbant bike x 10 min no resistance not checking sats/ very little insight   rec Try to build up duration on bike at least 20 minutes before adding resistance stop symbicort  Prednisone 10 mg  Take 2 daily until better then reduce to one daily x 2 weeks then one half daily  GERD diet    07/27/2016  f/u ov/Malanie Koloski re: PF ? Steroid resp now on 5 mg daily  Chief Complaint  Patient presents with  . Follow-up    Breathing is unchanged. She has been on pred 5 mg for the past 2.5 wks.    worse leg swelling since stopped diuretic/ no worse sob as tapered pred  Doe still = MMRC3 but also limited by knees  rec Take your fluid pill from your primary care doctor as needed for leg swelling  Decrease prednisone to 5 mg every other day x 2 weeks and stop and see if you note any change in symptoms Please remember to go to the lab  department downstairs in the basement  for your tests - we will call you with the results when they are available. Change 02 to 2lpm with cpap at bedtime and 3 lpm walking more than around the house and certainly with exercise but not need to take any longer at rest unless  you feel you need it to keep sats over 90%      09/08/2016  f/u ov/Laster Appling re:  PF ? Steroid responsive  off prednisone  Mid march 2018  Chief Complaint  Patient presents with  . Follow-up    Pt c/o wheezing for the past 2 wks. Her breathing is unchanged. She has been trying to cough more when she notices the wheezing and is producing minimal white sputum.    noct wheeze/ desats even on cpap/ 02 and not using saba and wheeze goes away on its own  No change ex tol  rec Try prilosec otc 70m  Take 30-60 min before first meal of the day and Pepcid ac (famotidine) 20 mg one @  bedtime until wheeze  is completely gone for at least a week or until you return, whichever comes first GERD diet      10/08/2016  f/u ov/Mulan Adan re:  PF steroid resp/ no flare off prednisone, no need for saba  Chief Complaint  Patient presents with  . Follow-up    Breathing is about the same. No new co's today. She has not used albuterol.    rec Only use your albuterol (ventolin) as a rescue medication  Continue 02 2lpm at bedtime on cpap  and when walking farther/faster than you did for [place] To get the most out of exercise, you need to be continuously aware that you are short of breath    01/10/2017  f/u ov/Susa Bones re:   PF steroid responsive/ cough better with chlorpheniramine  Chief Complaint  Patient presents with  . Follow-up    pt notes increased upper wheezing, some PND.    no longer on gerd rx / noct wheeze recurred since stopped gerd rx  Sleeping on cpap and 02 and "wheezing resolves  s saba" usually noct not daytime / coughs and clears out throat but s excess/ purulent sputum or mucus plugs   rec In event coughing or wheezing worsens >  Try prilosec otc 272m Take 30-60 min before first meal of the day and Pepcid ac (famotidine) 20 mg one @  bedtime until cough is completely gone for at least a week without the need for cough suppression Keep up the good work on the wt loss  Please schedule a follow up visit in 6  months but call sooner if needed    07/12/2017  f/u ov/Koi Yarbro re:  PF/  No prednisone  On 3lpm  exertion  off prednisone  Mid march 2018 Chief Complaint  Patient presents with  . Follow-up    Breathing is unchanged. No new co's today. She rarely uses her albuterol.    Dyspnea: slowed by more by L knees and both hips > 02 Cough: no Sleep: ok cpap Nose dripping has not tried h1 x at hs    No obvious day to day or daytime variability or assoc excess/ purulent sputum or mucus plugs or hemoptysis or cp or chest tightness, subjective wheeze or overt sinus or hb symptoms. No unusual exposure hx or h/o childhood pna/ asthma or knowledge of premature birth.  Sleeping ok flat without nocturnal  or early am exacerbation  of respiratory  c/o's or need for noct saba. Also denies any obvious fluctuation of symptoms with weather or environmental changes or other aggravating or alleviating factors except as outlined above   Current Allergies, Complete Past Medical History, Past Surgical History, Family History, and Social History were reviewed in [employer].  ROS  The following are not active complaints unless bolded Hoarseness, sore throat, dysphagia, dental problems, itching, sneezing,  nasal congestion or discharge of excess mucus or purulent secretions, ear ache,   fever, chills, sweats, unintended wt loss or wt gain, classically pleuritic or exertional cp,  orthopnea pnd or leg swelling, presyncope, palpitations, abdominal pain, anorexia, nausea, vomiting, diarrhea  or change in bowel habits or change in bladder habits, change in stools or change in urine, dysuria, hematuria,  rash, arthralgias, visual complaints, headache, numbness, weakness or ataxia or problems with walking or coordination,  change in mood/affect or memory.        Current Meds  Medication Sig  . acetaminophen (TYLENOL) 500 MG tablet Take 500 mg by mouth every 6 (six) hours as needed.  Marland Kitchen aspirin EC 81 MG tablet Take 81 mg by mouth at bedtime.  . chlorpheniramine (CHLOR-TRIMETON) 4 MG tablet Take 4  mg by mouth at bedtime.  . Cholecalciferol (VITAMIN D) 2000 UNITS CAPS Take 2,000 Units by mouth every evening.  . cholestyramine light (PREVALITE) 4 g packet Take 4 g by mouth daily as needed (cholesterol).   . Coenzyme Q10 (CO Q 10 PO) Take 1 tablet by mouth 2 (two) times daily.   Marland Kitchen conjugated estrogens (PREMARIN) vaginal cream Place 1 Applicatorful vaginally every other day.  . escitalopram (LEXAPRO) 10 MG tablet Take 10 mg by mouth at bedtime.  . gabapentin (NEURONTIN) 400 MG capsule Take 400 mg by mouth at bedtime.  Marland Kitchen ibuprofen (ADVIL,MOTRIN) 800 MG tablet Take 1 tablet (800 mg total) by mouth every 8 (eight) hours as needed.  . OXYGEN 2lpm with sleep and 3lpm with exertion [facility]  . simvastatin (ZOCOR) 10 MG tablet Take 10 mg by mouth every evening.                       Objective:   Physical Exam  Obese wf all smiles   07/12/2017      262  01/10/2017       259   10/08/2016       286  09/08/2016       294   07/27/2016      298  05/04/2016        290 03/23/2016     278   02/10/16 276 lb (125.2 kg)  01/07/16 274 lb (124.3 kg)  12/05/15 279 lb 3.2 oz (126.6 kg)    Vital signs reviewed - Note on arrival 02 sats  95% on RA            HEENT: nl dentition, turbinates bilaterally, and oropharynx. Nl external ear canals without cough reflex   NECK :  without JVD/Nodes/TM/ nl carotid upstrokes bilaterally   LUNGS: no acc muscle use,  Nl contour chest which is clear to A and P bilaterally without cough on insp or exp maneuvers   CV:  RRR  no s3 or murmur or increase in P2, and trace edema L > R   ABD:  soft and nontender with nl inspiratory excursion in the supine position. No bruits or organomegaly appreciated, bowel sounds nl  MS:  Nl gait/ ext warm without deformities, calf tenderness, cyanosis or clubbing No obvious joint restrictions   SKIN: warm and dry without lesions    NEURO:  alert, approp, nl sensorium with  no motor or cerebellar deficits apparent.        Labs ordered/ reviewed:      Chemistry  Component Value Date/Time   NA 137 07/27/2016 1631   K 4.3 07/27/2016 1631   CL 102 07/27/2016 1631   CO2 30 07/27/2016 1631   BUN 17 07/27/2016 1631   CREATININE 0.76 07/27/2016 1631      Component Value Date/Time   CALCIUM 9.3 07/27/2016 1631   ALKPHOS 59 07/27/2016 1631   AST 18 07/27/2016 1631   ALT 19 07/27/2016 1631   BILITOT 0.4 07/27/2016 1631        Lab Results  Component Value Date   WBC 5.8 07/12/2017   HGB 13.0 07/12/2017   HCT 38.9 07/12/2017   MCV 94.8 07/12/2017   PLT 230.0 07/12/2017       EOS                        0.1                                                                  07/12/2017       Lab Results  Component Value Date   TSH 1.51 07/27/2016       Lab Results  Component Value Date   ESRSEDRATE 34 (H) 07/12/2017   ESRSEDRATE 34 (H) 09/08/2016   ESRSEDRATE 31 (H) 07/27/2016                   Assessment & Plan:

## 2017-07-13 ENCOUNTER — Ambulatory Visit: Payer: Medicare Other | Admitting: Internal Medicine

## 2017-07-13 LAB — RESPIRATORY ALLERGY PROFILE REGION II ~~LOC~~
Allergen, A. alternata, m6: <0.1 kU/L
Allergen, Comm Silver Birch, t9: <0.1 kU/L
Allergen, D pternoyssinus,d7: <0.1 kU/L
Allergen, Mouse Urine Protein, e78: <0.1 kU/L
Allergen, P. notatum, m1: <0.1 kU/L
Box Elder IgE: <0.1 kU/L
CLASS: 0
CLASS: 0
CLASS: 0
CLASS: 0
CLASS: 0
CLASS: 0
CLASS: 0
CLASS: 0
CLASS: 0
Class: 0
Class: 0
Class: 0
Class: 0
Class: 0
Class: 0
Class: 0
Class: 0
Class: 0
Class: 0
Class: 0
Class: 0
Class: 0
Class: 0
Class: 0
D. farinae: <0.1 kU/L
Elm IgE: <0.1 kU/L
IgE (Immunoglobulin E), Serum: 68 kU/L (ref <=114)
Johnson Grass: <0.1 kU/L
Pecan/Hickory Tree IgE: <0.1 kU/L

## 2017-07-13 LAB — INTERPRETATION:

## 2017-07-13 NOTE — Progress Notes (Signed)
Spoke with pt and notified of results per Dr. Wert. Pt verbalized understanding and denied any questions. 

## 2017-07-14 ENCOUNTER — Encounter: Payer: Self-pay | Admitting: Internal Medicine

## 2017-07-14 NOTE — Assessment & Plan Note (Signed)
Body mass index is 43.6 kg/m.  -  trending up still Lab Results  Component Value Date   TSH 1.51 07/27/2016     Contributing to gerd risk/ doe/reviewed the need and the process to achieve and maintain neg calorie balance > defer f/u primary care including intermittently monitoring thyroid status     see avs for instructions unique to this ov

## 2017-07-14 NOTE — Assessment & Plan Note (Signed)
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 -  12/12/14  FVC = 2.11 - 06/19/15   FVC = 2.16  -  PFTs 05/18/16  FVC 2.07 (63%) no obst and dlco 52 corrects to 98 on 10 mg daily pred with esr 26 -  ESR 07/27/2016  =   31  @ pred 5 mg daily > wean off pred by mid March 2018 and no change in ESR 09/08/2016  - .HSP profile 09/08/2016 >  Neg -  Spirometry 09/08/2016  FVC  1.89 off pred   - 07/12/2017   Walked RA  2 laps @ 185 ft each stopped due to  Sob /desat to 80%  - ESR 07/12/2017 = 34 (no change )   Overall better but still desats with exertion typical of ILD which presumably at [employer] fixed s/p remote macrodantin exposure so just needs to work on losing wt at this point and monitoring sats with activity on 3lpm    I had an extended discussion with the patient reviewing all relevant studies completed to date and  lasting 15 to 20 minutes of a 25 minute visit    Each maintenance medication was reviewed in detail including most importantly the difference between maintenance and prns and under what circumstances the prns are to be triggered using an action plan format that is not reflected in the computer generated alphabetically organized AVS.    Please see AVS for specific instructions unique to this visit that I personally wrote and verbalized to the the pt in detail and then reviewed with pt  by my nurse highlighting any  changes in therapy recommended at today's visit to their plan of care.

## 2017-07-14 NOTE — Assessment & Plan Note (Signed)
SATURATION QUALIFICATIONS:  02/10/2016  Patient Saturations on Room Air at Rest = 90% Patient Saturations on Room Air while Ambulating = 83% Patient Saturations on 3 Liters of oxygen while Ambulating = 90% - 07/27/2016   Walked RA  2 laps @ 185 ft each stopped due to  desat to 88% RA - 10/08/2016   Walked RA  2 laps @ 185 ft each stopped due to sob/ sats 88% fast pace   - 07/12/2017   Walked RA  2 laps @ 185 ft each stopped due to  Sob /desat to 80%   rx as of 07/12/2017  =  3lpm with walking / ex (goal is to keep > 90%)   t and hs with cpap  - none needed at rest

## 2018-01-09 ENCOUNTER — Encounter: Payer: Self-pay | Admitting: Internal Medicine

## 2018-01-09 ENCOUNTER — Ambulatory Visit: Payer: Medicare Other | Admitting: Internal Medicine

## 2018-01-09 VITALS — BP 124/80 | HR 80 | Ht 65.0 in | Wt 275.0 lb

## 2018-01-09 DIAGNOSIS — J9611 Chronic respiratory failure with hypoxia: Secondary | ICD-10-CM | POA: Diagnosis not present

## 2018-01-09 DIAGNOSIS — J849 Interstitial pulmonary disease, unspecified: Secondary | ICD-10-CM

## 2018-01-09 NOTE — Patient Instructions (Signed)
Wear 3lpm 02 with activity with goal of keeping your saturation above 90% at all times    Please schedule a follow up visit in 3 months but call sooner if needed - bring your portable system with you on return

## 2018-01-09 NOTE — Progress Notes (Signed)
Subjective:   Patient ID: Lindsay Brady, female    DOB: [birth year]    MRN: [id number]    Brief patient profile:  66yowf quit smoking 1998 at wt around 200 with some am cough and did fine s rx but in 2011 noted doe across parking lot where worked and dx as copd by [employer] and self referred to pulmonary clinic 12/05/2015 as shifting all her care to [place] with new dx of ILD 02/10/2016 p macrodantin exposure and no evidence of airflow ostruction on symb 160 2bid with poor baseline hfa technique.     History of Present Illness  12/05/2015 1st [place] [facility] office visit/ Namiko Pritts   Chief Complaint  Patient presents with  . Pulmonary Consult    Self referral. Pt states dxed with Emphysema in 2011. She has been seen at [facility] in the past. She c/o hoarseness and increased cough for the past yr.  Cough is prod with white to tan sputum.  She also c/o SOB with walking long distances and up stairs. Hot weather tends to make her breathing worse. She uses ventolin 2 x per day on average.   indolent onset doe x 6 y better on tudorza and spiriva (mouth dry) and no change on symbicort  Needs saba at least twice daily  Already did rehab with wt = ? But did well  Doe = MMRC3 = can't walk 100 yards even at a slow pace at a flat grade s stopping due to sob   Cough p stopped smoking got worse then better and resolved until one year prior to OV  And present daily since but never while on cpap / neg w/u reflux by GI but ent disagreed  rec Plan A = Automatic = symbicort 160 Take 2 puffs first thing in am and then another 2 puffs about 12 hours later / tudorza one twice daily  Plan B = Backup Only use your albuterol as a rescue medication GERD diet  For drainage / throat tickle try take CHLORPHENIRAMINE  4 mg - take one every 4 hours as needed     01/07/16 acute w/in with NP breathing a lot worse  Prednisone taper  Doxycyline x 7 days  Continue symbicort, tudorza  albuterol as needed  Will arrange  home O2  Will check some labs today  Will arrange an echocardiogram and cardiology referral     02/10/2016  Extended f/u ov/Tahj Njoku re: COPD 0/ probable ILD ? Macrodantin related ? When last dose ?  Chief Complaint  Patient presents with  . Follow-up    Cough and SOB had improved on pred and doxy and then worsened a few days after finished meds. Cough is prod and mucus gets hung in her throat. She has not needed to use albuterol since starting o2.   doe x MMRC2 = can't walk a nl pace on a flat grade s sob but does fine slow and flat eg  shopping on 02/ main issue is hacking cough and sensation of globus day >> noct rec Call us with the dates you took macrodantin and do not take this again > last rx  12/17/15 x 7 d only   Prednisone 10 mg take  4 each am x 2 days,   2 each am x 2 days,  1 each am x 2 days and stop    03/23/2016  f/u ov/Kaceton Vieau re:   ILD / macrodantin exp with high ESR  Chief Complaint  Patient presents with  .  Follow-up    Breathing is doing well. She has been using 2lpm with rest and 3 with exertion. Her cough has improved some but she is still c/o hoarseness.      some better while on prednisone only and worse cough/ sob after stopped  rec Prednisone 10 mg  Take 2 daily until better then reduce to one daily x 2 weeks then one half daily     05/04/2016  f/u ov/Printice Hellmer re: ILD ? Macrodantin on 5 mg daily  Chief Complaint  Patient presents with  . Follow-up    4 week follow up. Breathing has been ok. Was bothered by allergies around 3 weeks ago with a cough and fatigue.   mmrc = 2 = MMRC2 = can't walk a nl pace on a flat grade s sob but does fine slow and flat eg does fine walking on 3lpm leaning a cart / cough has resolved  rec Please remember to go to the lab  department downstairs for your tests - we will call you with the results when they are available. Schedule High Resolution CT chest and PFT on same day in next two weeks at [facility]  Highest prednisone  dose is 20 mg and the lowest  Is 5 mg  Avoid food that high in fat or carbs  Avoid fried foods/ starchy foods and sweets.  Please schedule a follow up office visit in 6 weeks, call sooner if needed  - consider wean off symbicort as this is not copd   06/01/16 Uri/ doxy 10 > back to baseline     06/15/2016  f/u ov/Eliya Bubar re: PF on pred 10 mg daily  X 6 days p taper from 20 on 02 2lpm  / copd 0 on symb 160 2bid  Chief Complaint  Patient presents with  . Follow-up    Pt is following up for ILD.   doe = 3lpm leaning on cart can do HT only = MMRC3 = can't walk 100 yards even at a slow pace at a flat grade s stopping due to sob  / never uses saba prior to ex / can't tell symbicort helps when misses doses Doing recumbant bike x 10 min no resistance not checking sats/ very little insight   rec Try to build up duration on bike at least 20 minutes before adding resistance stop symbicort  Prednisone 10 mg  Take 2 daily until better then reduce to one daily x 2 weeks then one half daily  GERD diet    07/27/2016  f/u ov/Jasiri Hanawalt re: PF ? Steroid resp now on 5 mg daily  Chief Complaint  Patient presents with  . Follow-up    Breathing is unchanged. She has been on pred 5 mg for the past 2.5 wks.    worse leg swelling since stopped diuretic/ no worse sob as tapered pred  Doe still = MMRC3 but also limited by knees  rec Take your fluid pill from your primary care doctor as needed for leg swelling  Decrease prednisone to 5 mg every other day x 2 weeks and stop and see if you note any change in symptoms Please remember to go to the lab  department downstairs in the basement  for your tests - we will call you with the results when they are available. Change 02 to 2lpm with cpap at bedtime and 3 lpm walking more than around the house and certainly with exercise but not need to take any longer at rest unless  you feel you need it to keep sats over 90%      09/08/2016  f/u ov/Meiling Hendriks re:  PF ? Steroid responsive  off prednisone  Mid march 2018  Chief Complaint  Patient presents with  . Follow-up    Pt c/o wheezing for the past 2 wks. Her breathing is unchanged. She has been trying to cough more when she notices the wheezing and is producing minimal white sputum.    noct wheeze/ desats even on cpap/ 02 and not using saba and wheeze goes away on its own  No change ex tol  rec Try prilosec otc 70m  Take 30-60 min before first meal of the day and Pepcid ac (famotidine) 20 mg one @  bedtime until wheeze  is completely gone for at least a week or until you return, whichever comes first GERD diet      10/08/2016  f/u ov/Belen Pesch re:  PF steroid resp/ no flare off prednisone, no need for saba  Chief Complaint  Patient presents with  . Follow-up    Breathing is about the same. No new co's today. She has not used albuterol.    rec Only use your albuterol (ventolin) as a rescue medication  Continue 02 2lpm at bedtime on cpap  and when walking farther/faster than you did for [place] To get the most out of exercise, you need to be continuously aware that you are short of breath    01/10/2017  f/u ov/Carthel Castille re:   PF steroid responsive/ cough better with chlorpheniramine  Chief Complaint  Patient presents with  . Follow-up    pt notes increased upper wheezing, some PND.    no longer on gerd rx / noct wheeze recurred since stopped gerd rx  Sleeping on cpap and 02 and "wheezing resolves  s saba" usually noct not daytime / coughs and clears out throat but s excess/ purulent sputum or mucus plugs   rec In event coughing or wheezing worsens >  Try prilosec otc 254m Take 30-60 min before first meal of the day and Pepcid ac (famotidine) 20 mg one @  bedtime until cough is completely gone for at least a week without the need for cough suppression Keep up the good work on the wt loss  Please schedule a follow up visit in 6  months but call sooner if needed    07/12/2017  f/u ov/Gari Trovato re:  PF/  No prednisone  On 3lpm  exertion  off prednisone  Mid march 2018 Chief Complaint  Patient presents with  . Follow-up    Breathing is unchanged. No new co's today. She rarely uses her albuterol.    Dyspnea: slowed by more by L knees and both hips > 02 Cough: no Sleep: ok cpap Nose dripping has not tried h1 x at hs  rec Please remember to go to the lab department downstairs in the basement  for your tests - we will call you with the results when they are available. No change in medications Only use your albuterol as a rescue medication    01/09/2018  f/u ov/Damarious Holtsclaw re:  PF  Chief Complaint  Patient presents with  . Follow-up    Breathing is unchanged. She has had non prod cough x 3 wks. She has not filled the rx for albuterol.    Dyspnea: MMRC3 = can't walk 100 yards even at a slow pace at a flat grade s stopping due to sob  Off 02  Cough: sporadic daytime  dry  Sleeping: on cpap @ hob  30 degrees / and 02 2lpm  SABA use: none 02:  Usually not using x for hs (not titrating as instructed)    No obvious day to day or daytime variability or assoc excess/ purulent sputum or mucus plugs or hemoptysis or cp or chest tightness, subjective wheeze or overt sinus or hb symptoms.   Sleeping as above  without nocturnal  or early am exacerbation  of respiratory  c/o's or need for noct saba. Also denies any obvious fluctuation of symptoms with weather or environmental changes or other aggravating or alleviating factors except as outlined above   No unusual exposure hx or h/o childhood pna/ asthma or knowledge of premature birth.  Current Allergies, Complete Past Medical History, Past Surgical History, Family History, and Social History were reviewed in [employer] record.  ROS  The following are not active complaints unless bolded Hoarseness, sore throat, dysphagia, dental problems, itching, sneezing,  nasal congestion or discharge of excess mucus or purulent secretions, ear ache,   fever, chills,  sweats, unintended wt loss or wt gain, classically pleuritic or exertional cp,  orthopnea pnd or arm/hand swelling  or leg swelling, presyncope, palpitations, abdominal pain, anorexia, nausea, vomiting, diarrhea  or change in bowel habits or change in bladder habits, change in stools or change in urine, dysuria, hematuria,  rash, arthralgias, visual complaints, headache, numbness, weakness or ataxia or problems with walking or coordination,  change in mood or  memory.        Current Meds  Medication Sig  . acetaminophen (TYLENOL) 500 MG tablet Take 500 mg by mouth every 6 (six) hours as needed.  Marland Kitchen albuterol (PROVENTIL HFA;VENTOLIN HFA) 108 (90 Base) MCG/ACT inhaler Inhale 2 puffs into the lungs every 6 (six) hours as needed for wheezing or shortness of breath.  Marland Kitchen aspirin EC 81 MG tablet Take 81 mg by mouth at bedtime.  . chlorpheniramine (CHLOR-TRIMETON) 4 MG tablet Take 4 mg by mouth at bedtime.  . Cholecalciferol (VITAMIN D) 2000 UNITS CAPS Take 2,000 Units by mouth every evening.  . cholestyramine light (PREVALITE) 4 g packet Take 4 g by mouth daily as needed (cholesterol).   Marland Kitchen escitalopram (LEXAPRO) 10 MG tablet Take 10 mg by mouth at bedtime.  . gabapentin (NEURONTIN) 400 MG capsule Take 400 mg by mouth at bedtime.  Marland Kitchen ibuprofen (ADVIL,MOTRIN) 800 MG tablet Take 1 tablet (800 mg total) by mouth every 8 (eight) hours as needed.  . OXYGEN 2lpm with sleep and 3lpm with exertion [facility]  . simvastatin (ZOCOR) 10 MG tablet Take 10 mg by mouth every evening.                          Objective:   Physical Exam  Obese wf nad    01/09/2018      275  07/12/2017      262  01/10/2017       259   10/08/2016       286  09/08/2016       294   07/27/2016      298  05/04/2016        290 03/23/2016     278   02/10/16 276 lb (125.2 kg)  01/07/16 274 lb (124.3 kg)  12/05/15 279 lb 3.2 oz (126.6 kg)     Vital signs reviewed - Note on arrival 02 sats  96% on RA  HEENT: nl dentition,  turbinates bilaterally, and oropharynx. Nl external ear canals without cough reflex   NECK :  without JVD/Nodes/TM/ nl carotid upstrokes bilaterally   LUNGS: no acc muscle use,  Nl contour chest which is clear to A and P bilaterally without cough on insp or exp maneuvers   CV:  RRR  no s3 or murmur or increase in P2, and pitting edema 1-2+ L >R lower ext   ABD:  Massively obese but  nontender with limited  inspiratory excursion  . No bruits or organomegaly appreciated, bowel sounds nl  MS:  Nl gait/ ext warm without deformities, calf tenderness, cyanosis or clubbing No obvious joint restrictions   SKIN: warm and dry without lesions    NEURO:  alert, approp, nl sensorium with  no motor or cerebellar deficits apparent.                    Assessment & Plan:

## 2018-01-10 ENCOUNTER — Encounter: Payer: Self-pay | Admitting: Internal Medicine

## 2018-01-10 NOTE — Assessment & Plan Note (Signed)
Body mass index is 45.76 kg/m.  -  trending up still  Lab Results  Component Value Date   TSH 1.51 07/27/2016     Contributing to gerd risk/ doe/reviewed the need and the process to achieve and maintain neg calorie balance > defer f/u primary care including intermittently monitoring thyroid status

## 2018-01-10 NOTE — Assessment & Plan Note (Signed)
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 -  12/12/14  FVC = 2.11 - 06/19/15   FVC = 2.16  -  PFTs 05/18/16  FVC 2.07 (63%) no obst and dlco 52 corrects to 98 on 10 mg daily pred with esr 26 -  ESR 07/27/2016  =   31  @ pred 5 mg daily > wean off pred by mid March 2018 and no change in ESR 09/08/2016  - .HSP profile 09/08/2016 >  Neg -  Spirometry 09/08/2016  FVC  1.89 off pred  - 07/12/2017   Walked RA  2 laps @ 185 ft each stopped due to  Sob /desat to 80%  - ESR 07/12/2017 = 34 (no change )   No clinical evidence of dz progression but advised again to monitor sats with activity and supplement as needed with enough 02 to keep sats > 90% and let me know if sats trending down or need for more 02 than baseline

## 2018-01-10 NOTE — Assessment & Plan Note (Signed)
SATURATION QUALIFICATIONS:  02/10/2016  Patient Saturations on Room Air at Rest = 90% Patient Saturations on Room Air while Ambulating = 83% Patient Saturations on 3 Liters of oxygen while Ambulating = 90% - 07/27/2016   Walked RA  2 laps @ 185 ft each stopped due to  desat to 88% RA - 10/08/2016   Walked RA  2 laps @ 185 ft each stopped due to sob/ sats 88% fast pace   - 07/12/2017   Walked RA  2 laps @ 185 ft each stopped due to  Sob /desat to 80%   rx as of 01/09/2018  =  3lpm with walking / ex (goal is to keep > 90%)  2lmp at  hs with cpap  - none needed at rest

## 2018-02-07 ENCOUNTER — Other Ambulatory Visit: Payer: Self-pay | Admitting: Internal Medicine

## 2018-03-31 ENCOUNTER — Telehealth: Payer: Self-pay | Admitting: Internal Medicine

## 2018-03-31 NOTE — Telephone Encounter (Signed)
Called and spoke with Patient. Patient is requesting a POC to take with her to [place]. She will be caring for a family member and uses 2L [place] at rest, and 3L [place] with exertion.  She stated that her relative would pay for her POC. She will be leaving in 1 1/2 weeks. Called and spoke with [place], [facility], who stated that we would have to speak with the [facility] retail store.  Called [phone], ext [phone], and left message for someone to call back.

## 2018-04-03 NOTE — Telephone Encounter (Signed)
[facility] had to leave a message will call back

## 2018-04-04 NOTE — Telephone Encounter (Signed)
Per Barbara Cower with [facility], who states he will emailed retail store regarding travel oxygen.  Lm to make pt aware.

## 2018-04-04 NOTE — Telephone Encounter (Signed)
lmtcb for [facility].

## 2018-04-04 NOTE — Telephone Encounter (Signed)
Pt is aware of below message and voiced her understanding.  I advised pt to call our office back, if [facility] has not reached out to her by the end of the week.

## 2018-04-04 NOTE — Telephone Encounter (Signed)
Patient called back about her replacement need before she goes out of town of  O2 portable; needs a call back # [phone]

## 2018-04-05 NOTE — Telephone Encounter (Signed)
Called patient unable to reach left message to give us a call back.

## 2018-04-05 NOTE — Telephone Encounter (Signed)
Pt is calling back [phone]

## 2018-04-05 NOTE — Telephone Encounter (Signed)
Called and spoke with patient she stated that she was contacted by [facility]. She stated that she will not be getting anything from [facility] she will be getting it else where. She ordered it from [facility] supply. They are needing a prescription for the oxygen before they will deliver it.   MW please advise, thank you.

## 2018-04-06 NOTE — Telephone Encounter (Signed)
LMTCB. We need to know if this company that she is getting her POC can titrate her and if they don't she will need to come into the office for a visit to be titrated.

## 2018-04-06 NOTE — Telephone Encounter (Signed)
LMTCB

## 2018-04-06 NOTE — Telephone Encounter (Signed)
rx as of 01/09/2018  =  3lpm with walking / ex (goal is to keep > 90%)  2lpm at  hs with cpap   Ok to write for this - she needs to be aware that if using poc will need to turn the Pulsed 02 up to keep > 90% (vs continuous) depending on how much / how far she's walking

## 2018-04-07 NOTE — Telephone Encounter (Signed)
Attempted to call patient today regarding results. I did not receive an answer at time of call. I have left a voicemail message for pt to return call. X1 We need to know if this company that she is getting her POC can titrate her  if they don't she will need to come into the office for a visit to be titrated.

## 2018-04-07 NOTE — Telephone Encounter (Signed)
Patient is returning the call. CB is [phone].

## 2018-04-07 NOTE — Telephone Encounter (Signed)
LMTCB

## 2018-04-10 NOTE — Telephone Encounter (Signed)
Attempted to contact pt. I did not receive an answer. I have left a message for pt to return our call.  

## 2018-04-11 ENCOUNTER — Ambulatory Visit (INDEPENDENT_AMBULATORY_CARE_PROVIDER_SITE_OTHER): Payer: Medicare Other | Admitting: Internal Medicine

## 2018-04-11 ENCOUNTER — Telehealth: Payer: Self-pay | Admitting: Internal Medicine

## 2018-04-11 ENCOUNTER — Encounter: Payer: Self-pay | Admitting: Internal Medicine

## 2018-04-11 VITALS — BP 114/78 | HR 72 | Ht 65.0 in | Wt 283.0 lb

## 2018-04-11 DIAGNOSIS — J849 Interstitial pulmonary disease, unspecified: Secondary | ICD-10-CM | POA: Diagnosis not present

## 2018-04-11 DIAGNOSIS — J9611 Chronic respiratory failure with hypoxia: Secondary | ICD-10-CM | POA: Diagnosis not present

## 2018-04-11 NOTE — Assessment & Plan Note (Addendum)
Body mass index is 47.09 kg/m.  -  trending up  Lab Results  Component Value Date   TSH 1.51 07/27/2016     Contributing to gerd risk/ doe/reviewed the need and the process to achieve and maintain neg calorie balance > defer f/u primary care including intermittently monitoring thyroid status       I had an extended discussion with the patient reviewing all relevant studies completed to date and  lasting 15 to 20 minutes of a 25 minute visit    Each maintenance medication was reviewed in detail including most importantly the difference between maintenance and prns and under what circumstances the prns are to be triggered using an action plan format that is not reflected in the computer generated alphabetically organized AVS.     Please see AVS for specific instructions unique to this visit that I personally wrote and verbalized to the the pt in detail and then reviewed with pt  by my nurse highlighting any  changes in therapy recommended at today's visit to their plan of care.

## 2018-04-11 NOTE — Assessment & Plan Note (Addendum)
SATURATION QUALIFICATIONS:  02/10/2016  Patient Saturations on Room Air at Rest = 90% Patient Saturations on Room Air while Ambulating = 83% Patient Saturations on 3 Liters of oxygen while Ambulating = 90% - 07/27/2016   Walked RA  2 laps @ 185 ft each stopped due to  desat to 88% RA - 10/08/2016   Walked RA  2 laps @ 185 ft each stopped due to sob/ sats 88% fast pace   - 07/12/2017   Walked RA  2 laps @ 185 ft each stopped due to  Sob /desat to 80%   rx as of 04/11/2018  =  3lpm with walking/   2lmp at  hs with cpap  - none needed at rest    Advised again to monitor while walking, not after, and to keep the sats > 90% to "help burn fat"

## 2018-04-11 NOTE — Telephone Encounter (Signed)
Pt aware we called to remind her of appt location today  Nothing further needed

## 2018-04-11 NOTE — Assessment & Plan Note (Addendum)
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 -  12/12/14  FVC = 2.11 - 06/19/15   FVC = 2.16  -  PFTs 05/18/16  FVC 2.07 (63%) no obst and dlco 52 corrects to 98 on 10 mg daily pred with esr 26 -  ESR 07/27/2016  =   31  @ pred 5 mg daily > wean off pred by mid March 2018 and no change in ESR 09/08/2016  - .HSP profile 09/08/2016 >  Neg -  Spirometry 09/08/2016  FVC  1.89 off pred  - 07/12/2017   Walked RA  2 laps @ 185 ft each stopped due to  Sob /desat to 80%  - ESR 07/12/2017 = 34 (no change )   Clinically doing well off prednisone x > one year for 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

## 2018-04-11 NOTE — Progress Notes (Signed)
Subjective:   Patient ID: Lindsay Brady, female    DOB: [birth year]    MRN: [id number]    Brief patient profile:  30 yowf quit smoking 1998 at wt around 200 with some am cough and did fine s rx but in 2011 noted doe across parking lot where worked and dx as copd by [employer] and self referred to pulmonary clinic 12/05/2015 as shifting all her care to [place] with new dx of ILD 02/10/2016 p macrodantin exposure and no evidence of airflow ostruction on symb 160 2bid with poor baseline hfa technique.     History of Present Illness  12/05/2015 1st [facility] office visit/ [name]   Chief Complaint  Patient presents with  . Pulmonary Consult    Self referral. Pt states dxed with Emphysema in 2011. She has been seen at [facility] in the past. She c/o hoarseness and increased cough for the past yr.  Cough is prod with white to tan sputum.  She also c/o SOB with walking long distances and up stairs. Hot weather tends to make her breathing worse. She uses ventolin 2 x per day on average.   indolent onset doe x 6 y better on tudorza and spiriva (mouth dry) and no change on symbicort  Needs saba at least twice daily  Already did rehab with wt = ? But did well  Doe = MMRC3 = can't walk 100 yards even at a slow pace at a flat grade s stopping due to sob   Cough p stopped smoking got worse then better and resolved until one year prior to OV  And present daily since but never while on cpap / neg w/u reflux by GI but ent disagreed  rec Plan A = Automatic = symbicort 160 Take 2 puffs first thing in am and then another 2 puffs about 12 hours later / tudorza one twice daily  Plan B = Backup Only use your albuterol as a rescue medication GERD diet  For drainage / throat tickle try take CHLORPHENIRAMINE  4 mg - take one every 4 hours as needed        03/23/2016  f/u ov/[name] re:   ILD / macrodantin exp with high ESR  Chief Complaint  Patient presents with  . Follow-up    Breathing is doing well.  She has been using 2lpm with rest and 3 with exertion. Her cough has improved some but she is still c/o hoarseness.      some better while on prednisone only and worse cough/ sob after stopped  rec Prednisone 10 mg  Take 2 daily until better then reduce to one daily x 2 weeks then one half daily    06/15/2016  f/u ov/[name] re: PF on pred 10 mg daily  X 6 days p taper from 20 on 02 2lpm  / copd 0 on symb 160 2bid  Chief Complaint  Patient presents with  . Follow-up    Pt is following up for ILD.   doe = 3lpm leaning on cart can do HT only = MMRC3 = can't walk 100 yards even at a slow pace at a flat grade s stopping due to sob  / never uses saba prior to ex / can't tell symbicort helps when misses doses Doing recumbant bike x 10 min no resistance not checking sats/ very little insight   rec Try to build up duration on bike at least 20 minutes before adding resistance stop symbicort  Prednisone 10 mg  Take 2 daily until better then reduce to one daily x 2 weeks then one half daily  GERD diet   09/08/2016  f/u ov/[name] re:  PF ? Steroid responsive off prednisone  Mid march 2018  Chief Complaint  Patient presents with  . Follow-up    Pt c/o wheezing for the past 2 wks. Her breathing is unchanged. She has been trying to cough more when she notices the wheezing and is producing minimal white sputum.    noct wheeze/ desats even on cpap/ 02 and not using saba and wheeze goes away on its own  No change ex tol  rec Try prilosec otc 39m  Take 30-60 min before first meal of the day and Pepcid ac (famotidine) 20 mg one @  bedtime until wheeze  is completely gone for at least a week or until you return, whichever comes first GERD diet    01/09/2018  f/u ov/[name] [place]:  [place] presents with  . Follow-up    Breathing is unchanged. She has had non prod cough x 3 wks. She has not filled the rx for albuterol.    Dyspnea: MMRC3 = can't walk 100 yards even at a  slow pace at a flat grade s stopping due to sob  Off 02  Cough: sporadic daytime dry  Sleeping: on cpap @ hob  30 degrees / and 02 2lpm  SABA use: none 02:  Usually not using x for hs (not titrating as instructed) rec Wear 3lpm 02 with activity with goal of keeping your saturation above 90% at all times  Please schedule a follow up visit in 3 months but call sooner if needed - bring your portable system with you on return     04/11/2018  f/u ov/[name] re:  PF / 02 dep with ex and hs only  Chief Complaint  Patient presents with  . Follow-up    Breathing is overall doing well. She rarely uses her albuterol.   Dyspnea:  Very little activity / all shopping on line, planning on purchasing inogen on her own. Cough: no Sleeping: > 30 degrees SABA use: rarely  02: 2lpm hs and 3lpm with activity  / not monitoring sats  As rec  No obvious day to day or daytime variability or assoc excess/ purulent sputum or mucus plugs or hemoptysis or cp or chest tightness, subjective wheeze or overt sinus or hb symptoms.   Sleeping on cpap /02 2lpm  without nocturnal  or early am exacerbation  of respiratory  c/o's or need for noct saba. Also denies any obvious fluctuation of symptoms with weather or environmental changes or other aggravating or alleviating factors except as outlined above   No unusual exposure hx or h/o childhood pna/ asthma or knowledge of premature birth.  Current Allergies, Complete Past Medical History, Past Surgical History, Family History, and Social History were reviewed in [employer].  ROS  The following are not active complaints unless bolded Hoarseness, sore throat, dysphagia, dental problems, itching, sneezing,  nasal congestion or discharge of excess mucus or purulent secretions, ear ache,   fever, chills, sweats, unintended wt loss or wt gain, classically pleuritic or exertional cp,  orthopnea pnd or arm/hand swelling  or leg swelling, presyncope,  palpitations, abdominal pain, anorexia, nausea, vomiting, diarrhea  or change in bowel habits or change in bladder habits, change in stools or change in urine, dysuria, hematuria,  rash, arthralgias, visual complaints, headache, numbness,  weakness or ataxia or problems with walking or coordination,  change in mood or  memory.        Current Meds  Medication Sig  . acetaminophen (TYLENOL) 500 MG tablet Take 500 mg by mouth every 6 (six) hours as needed.  Marland Kitchen albuterol (PROVENTIL HFA;VENTOLIN HFA) 108 (90 Base) MCG/ACT inhaler Inhale 2 puffs into the lungs every 6 (six) hours as needed for wheezing or shortness of breath.  Marland Kitchen aspirin EC 81 MG tablet Take 81 mg by mouth at bedtime.  . chlorpheniramine (CHLOR-TRIMETON) 4 MG tablet Take 4 mg by mouth at bedtime.  . Cholecalciferol (VITAMIN D) 2000 UNITS CAPS Take 2,000 Units by mouth every evening.  . cholestyramine light (PREVALITE) 4 g packet Take 4 g by mouth daily as needed (cholesterol).   Marland Kitchen escitalopram (LEXAPRO) 10 MG tablet Take 10 mg by mouth at bedtime.  . famotidine (PEPCID) 20 MG tablet Take 20 mg by mouth at bedtime as needed for heartburn or indigestion.  . gabapentin (NEURONTIN) 400 MG capsule Take 400 mg by mouth at bedtime.  Marland Kitchen ibuprofen (ADVIL,MOTRIN) 800 MG tablet Take 1 tablet (800 mg total) by mouth every 8 (eight) hours as needed.  Marland Kitchen omeprazole (PRILOSEC) 20 MG capsule Take 20 mg by mouth daily as needed.  . OXYGEN 2lpm with sleep and 3lpm with exertion AHC  . simvastatin (ZOCOR) 10 MG tablet Take 10 mg by mouth every evening.           Objective:  Physical Exam  Obese wf nad   04/11/2018    283  01/09/2018      275  07/12/2017      262  01/10/2017       259   10/08/2016       286  09/08/2016       294   07/27/2016      298  05/04/2016        290 03/23/2016     278   02/10/16 276 lb (125.2 kg)  01/07/16 274 lb (124.3 kg)  12/05/15 279 lb 3.2 oz (126.6 kg)    Vital signs reviewed - Note on arrival 02 sats  94-96 % on RA       HEENT: nl dentition, turbinates bilaterally, and oropharynx. Nl external ear canals without cough reflex   NECK :  without JVD/Nodes/TM/ nl carotid upstrokes bilaterally   LUNGS: no acc muscle use,  Nl contour chest with distant bs  bilaterally without cough on insp or exp maneuvers   CV:  RRR  no s3 or murmur or increase in P2, and trace pitting L > R LExt    ABD:  Massively obese  nontender with limited inspiratory excursion in the supine position. No bruits or organomegaly appreciated, bowel sounds nl  MS:  Nl gait/ ext warm without deformities, calf tenderness, cyanosis or clubbing No obvious joint restrictions   SKIN: warm and dry without lesions    NEURO:  alert, approp, nl sensorium with  no motor or cerebellar deficits apparent.       Assessment & Plan:

## 2018-04-11 NOTE — Telephone Encounter (Signed)
Attempted to call pt but no answer. Left message for pt to return call. 

## 2018-04-11 NOTE — Patient Instructions (Signed)
Be sure you adjust your 02 with activity to goal of  Keeping it over  90% the entire duration that you are active    Please schedule a follow up visit in 6 months but call sooner if needed

## 2018-04-12 ENCOUNTER — Ambulatory Visit (INDEPENDENT_AMBULATORY_CARE_PROVIDER_SITE_OTHER): Payer: Medicare Other | Admitting: Orthopedic Surgery

## 2018-04-12 ENCOUNTER — Encounter (INDEPENDENT_AMBULATORY_CARE_PROVIDER_SITE_OTHER): Payer: Self-pay | Admitting: Orthopedic Surgery

## 2018-04-12 VITALS — Ht 65.0 in | Wt 283.0 lb

## 2018-04-12 DIAGNOSIS — M4302 Spondylolysis, cervical region: Secondary | ICD-10-CM | POA: Diagnosis not present

## 2018-04-12 MED ORDER — PREDNISONE 10 MG PO TABS: 20.0000 mg | ORAL_TABLET | Freq: Every day | ORAL | 0 refills | Status: DC

## 2018-04-12 NOTE — Progress Notes (Signed)
Office Visit Note   Patient: Lindsay Brady           Date of Birth: Mar 01, 1951           MRN: [id number] Visit Date: 04/12/2018              Requested by: Tegeler, Smith Minceebra Renee, MD No address on file PCP: Tegeler, Smith Minceebra Renee, MD  Chief Complaint  Patient presents with  . Neck - Pain    C/o of left side neck pain; onset 11/13      HPI: Patient is a 67 year old woman who states she woke up this morning with acute neck pain on the left side she has difficulty with range of motion of her neck she states the pain radiates halfway down the left arm.  Patient denies any weakness denies any trauma she states she may have slept on it wrong.  Assessment & Plan: Visit Diagnoses:  1. Spondylolysis, cervical region     Plan: Recommended heat to her neck recommended continue with activities as she feels comfortable she will start with prednisone 20 mg with breakfast wean down to 10 mg as her symptoms resolved and then take this every other day and discontinue.  [place]  Follow-Up Instructions: Return if symptoms worsen or fail to improve.   Ortho Exam  Patient is alert, oriented, no adenopathy, well-dressed, normal affect, normal respiratory effort. Examination patient has tenderness to palpation of the paraspinous muscles on the left.  She has decreased range of motion of her cervical spine thoracic outlet is nontender there is minimal tenderness palpation along the medial scapular border.  Grip strength is symmetric in both upper extremities no focal motor weakness in either upper extremity.  Imaging: No results found. No images are attached to the encounter.  Labs: Lab Results  Component Value Date   ESRSEDRATE 34 (H) 07/12/2017   ESRSEDRATE 34 (H) 09/08/2016   ESRSEDRATE 31 (H) 07/27/2016   CRP 2.4 01/07/2016     Lab Results  Component Value Date   ALBUMIN 3.7 07/27/2016   ALBUMIN 2.7 (L) 07/22/2014   ALBUMIN 2.8 (L)  07/21/2014    Body mass index is 47.09 kg/m.  Orders:  No orders of the defined types were placed in this encounter.  Meds ordered this encounter  Medications  . predniSONE (DELTASONE) 10 MG tablet    Sig: Take 2 tablets (20 mg total) by mouth daily with breakfast.    Dispense:  60 tablet    Refill:  0     Procedures: No procedures performed  Clinical Data: No additional findings.  ROS:  All other systems negative, except as noted in the HPI. Review of Systems  Objective: Vital Signs: Ht 5\' 5"  (1.651 m)   Wt 283 lb (128.4 kg)   BMI 47.09 kg/m   Specialty Comments:  No specialty comments available.  PMFS History: Patient Active Problem List   Diagnosis Date Noted  . Primary osteoarthritis of left knee 03/10/2017  . Trochanteric bursitis of right hip 03/10/2017  . Anterior dislocation of left shoulder 12/20/2016  . Closed fracture of proximal end of left humerus with routine healing 12/20/2016  . Dyspnea on exertion 07/27/2016  . RBBB 03/04/2016  . OSA on CPAP 03/04/2016  . ILD (interstitial lung disease) (HCC)  c/w NSIP p macrodantin exp 02/11/2016  . Morbid obesity due to excess calories (HCC) 02/11/2016  . Chronic respiratory failure with hypoxia (HCC) 01/07/2016  . Upper airway  cough syndrome 12/06/2015  . COPD GOLD 0 12/05/2015  . Acute cholecystitis 07/19/2014   Past Medical History:  Diagnosis Date  . Anxiety   . Arthritis   . Cholecystitis 07/2014  . COPD (chronic obstructive pulmonary disease) (HCC)   . Diabetes mellitus without complication (HCC)    type 2   . Fracture 2013 ?   left leg  . GERD (gastroesophageal reflux disease)    hx of gerd  . Headache   . Hx: UTI (urinary tract infection)   . OA (osteoarthritis) of knee   . Pneumonia    hx of viral pna years ago  . Shortness of breath dyspnea   . Sleep apnea    uses cpap  . Urgency of urination     Family History  Problem Relation Age of Onset  . Stroke Mother   . Lung cancer  Father        smoked  . Colon cancer Father   . Cancer Paternal Grandmother   . Cancer Paternal Grandfather     Past Surgical History:  Procedure Laterality Date  . ABDOMINAL HYSTERECTOMY    . CARPAL TUNNEL RELEASE    . CHOLECYSTECTOMY N/A 07/19/2014   Procedure: LAPAROSCOPIC CHOLECYSTECTOMY WITH INTRAOPERATIVE CHOLANGIOGRAM;  Surgeon: Violeta Gelinas, MD;  Location: [facility] OR;  Service: General;  Laterality: N/A;  . COLONOSCOPY  ?   2011  . KNEE ARTHROSCOPY    . TONSILLECTOMY     Social History   Occupational History  . Not on file  Tobacco Use  . Smoking status: Former Smoker    Packs/day: 2.00    Years: 30.00    Pack years: 60.00    Types: Cigarettes    Last attempt to quit: 05/31/1996    Years since quitting: 21.8  . Smokeless tobacco: Never Used  Substance and Sexual Activity  . Alcohol use: No    Alcohol/week: 0.0 standard drinks    Comment: rare  . Drug use: No  . Sexual activity: Not on file

## 2018-04-12 NOTE — Telephone Encounter (Signed)
Pt was seen yesterday in the office with Dr. Sherene SiresWert. Will close encounter.

## 2018-06-21 IMAGING — DX DG SHOULDER 1V*L*
2 series · 2 of 2 positions shown · non-contrast
Comparison: 10/23/2016

CLINICAL DATA: Postreduction left shoulder

EXAM:
LEFT SHOULDER - 1 VIEW

[shoulder obl]
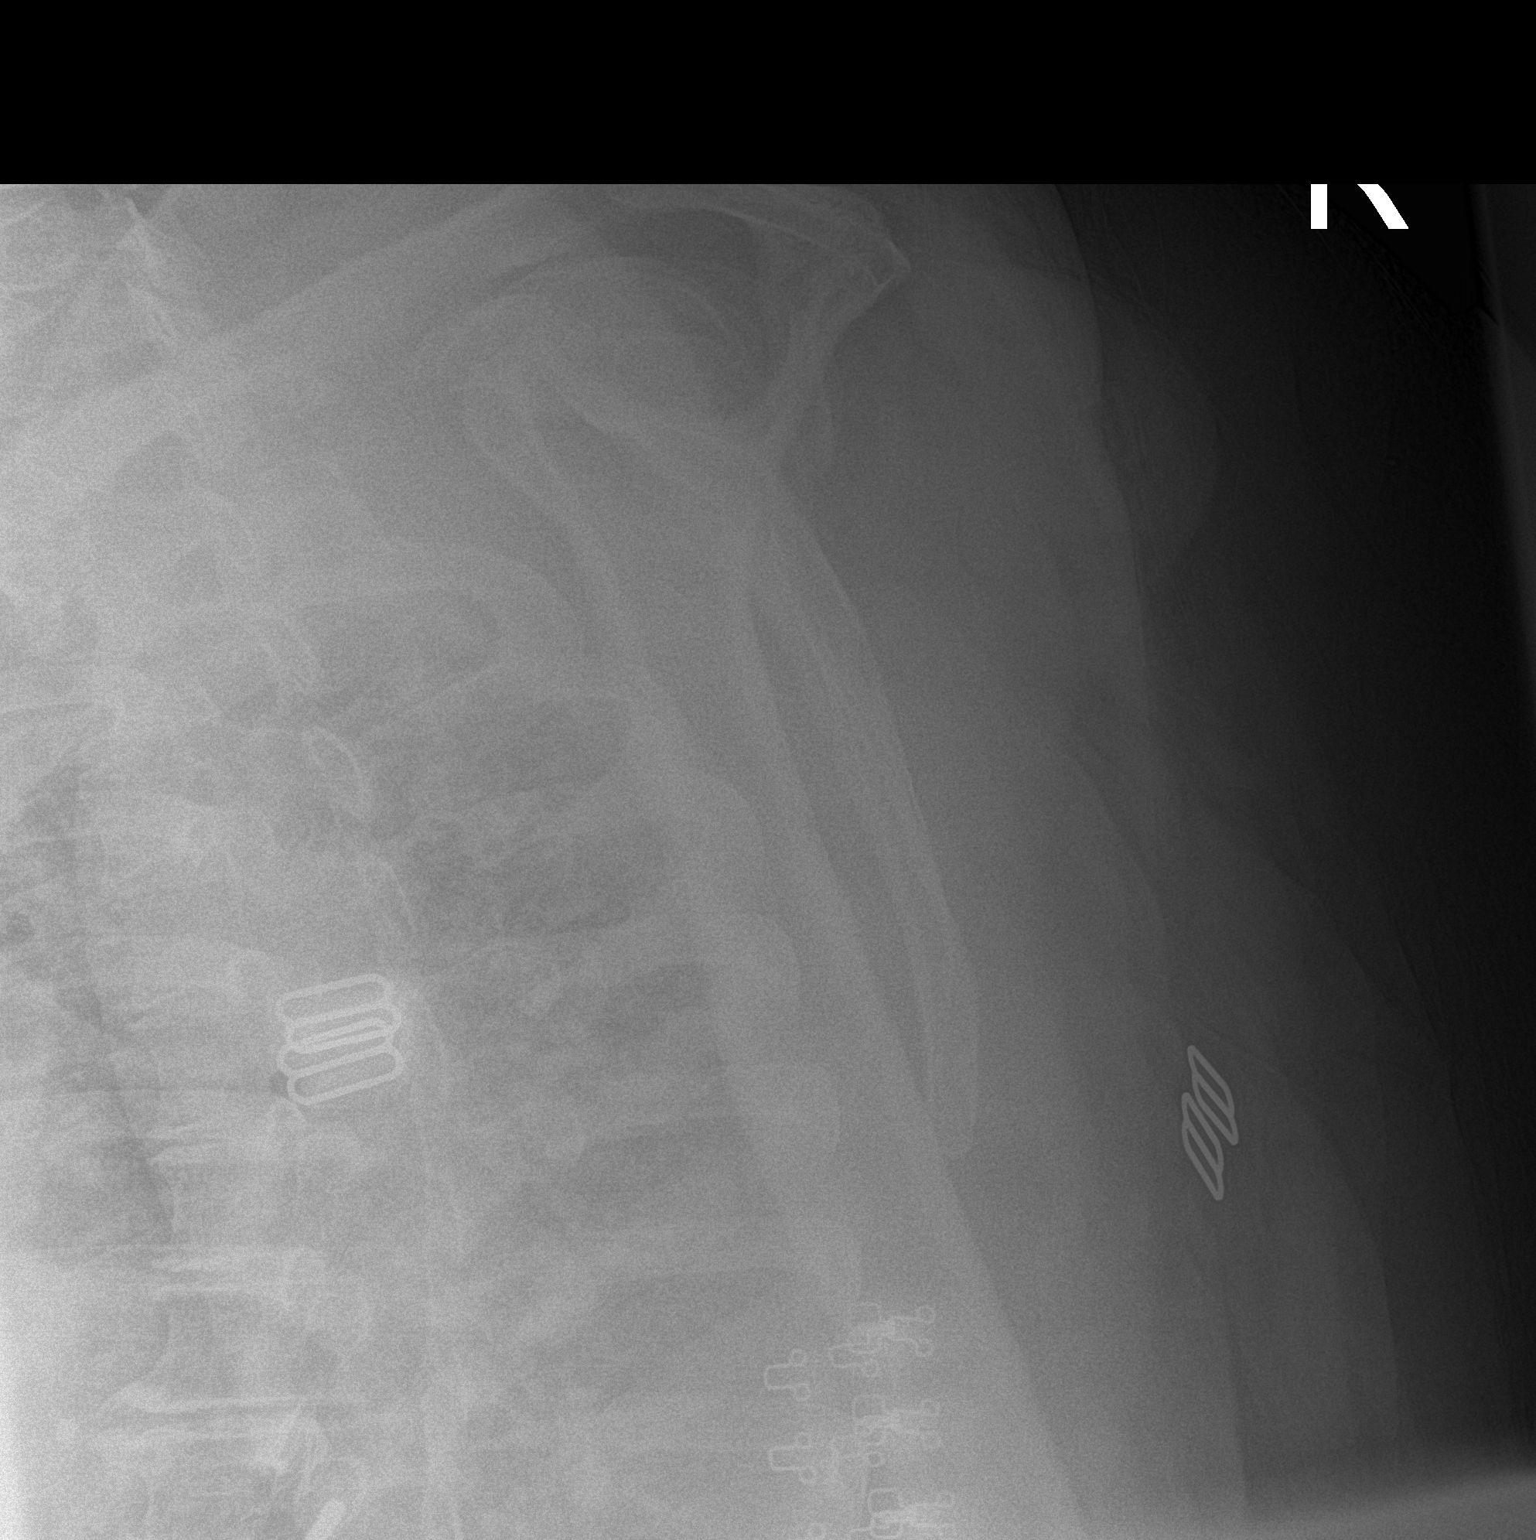

[shoulder ap]
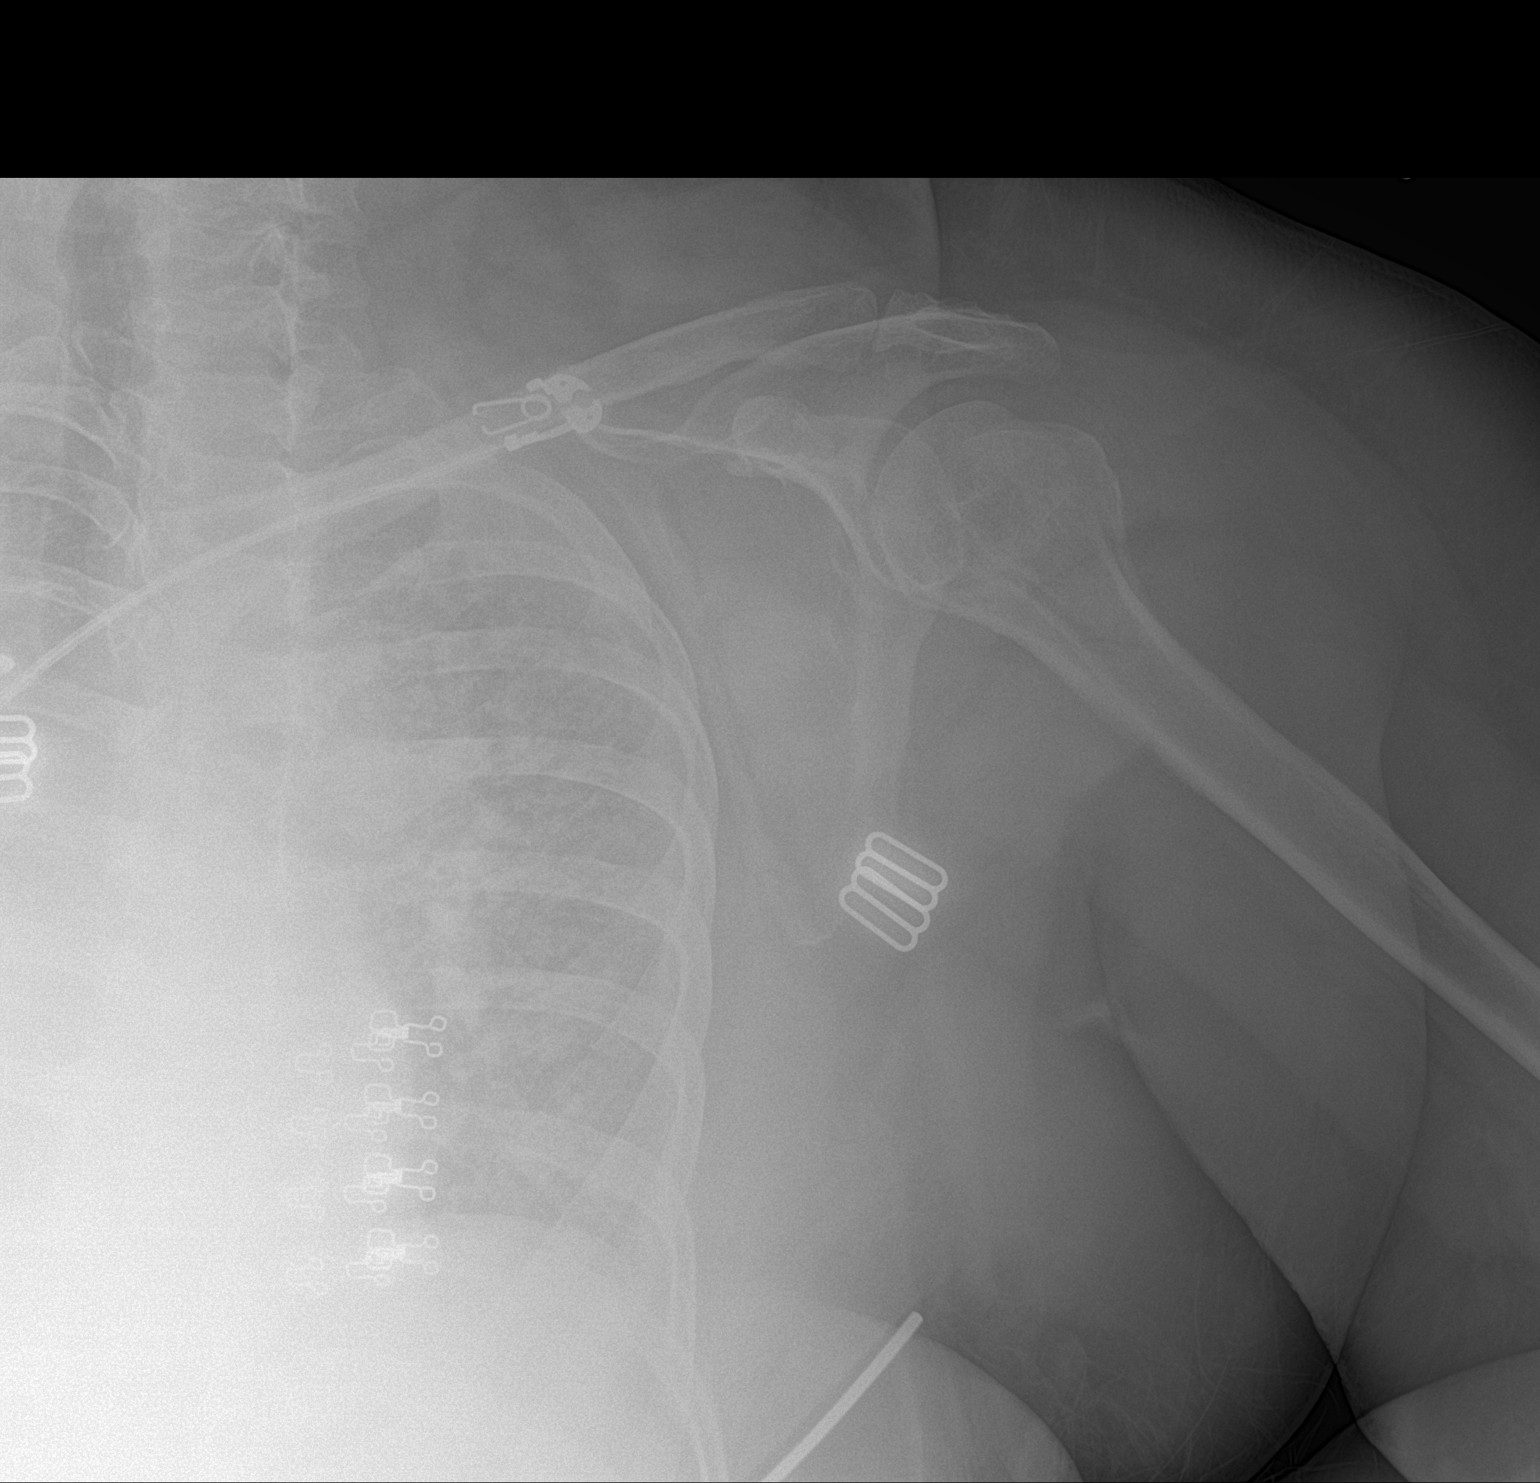

[2 of 2 positions shown; findings below may reference images not displayed]

FINDINGS: Anatomic position of the left shoulder postreduction. Fracture lines
remain visible but due to rotation are not well demonstrated.
Near-anatomic position of the visualized fractures.
IMPRESSION: Anatomic position of the left shoulder postreduction with
near-anatomic position of visualized proximal humeral fractures.

## 2018-06-22 ENCOUNTER — Encounter: Payer: Self-pay | Admitting: Internal Medicine

## 2018-06-22 ENCOUNTER — Ambulatory Visit: Payer: Medicare Other | Admitting: Internal Medicine

## 2018-06-22 VITALS — BP 124/80 | HR 77 | Temp 98.2°F | Ht 65.0 in | Wt 282.0 lb

## 2018-06-22 DIAGNOSIS — Z1239 Encounter for other screening for malignant neoplasm of breast: Secondary | ICD-10-CM | POA: Diagnosis not present

## 2018-06-22 DIAGNOSIS — Z23 Encounter for immunization: Secondary | ICD-10-CM | POA: Diagnosis not present

## 2018-06-22 DIAGNOSIS — Z1211 Encounter for screening for malignant neoplasm of colon: Secondary | ICD-10-CM

## 2018-06-22 DIAGNOSIS — G4733 Obstructive sleep apnea (adult) (pediatric): Secondary | ICD-10-CM

## 2018-06-22 DIAGNOSIS — Z9989 Dependence on other enabling machines and devices: Secondary | ICD-10-CM

## 2018-06-22 DIAGNOSIS — N959 Unspecified menopausal and perimenopausal disorder: Secondary | ICD-10-CM

## 2018-06-22 DIAGNOSIS — E119 Type 2 diabetes mellitus without complications: Secondary | ICD-10-CM

## 2018-06-22 DIAGNOSIS — J449 Chronic obstructive pulmonary disease, unspecified: Secondary | ICD-10-CM

## 2018-06-22 DIAGNOSIS — F419 Anxiety disorder, unspecified: Secondary | ICD-10-CM

## 2018-06-22 DIAGNOSIS — J849 Interstitial pulmonary disease, unspecified: Secondary | ICD-10-CM

## 2018-06-22 DIAGNOSIS — F411 Generalized anxiety disorder: Secondary | ICD-10-CM | POA: Insufficient documentation

## 2018-06-22 MED ORDER — MIRABEGRON ER 50 MG PO TB24: 50.0000 mg | ORAL_TABLET | Freq: Every day | ORAL | 1 refills | Status: DC

## 2018-06-22 MED ORDER — ESCITALOPRAM OXALATE 10 MG PO TABS: 10.0000 mg | ORAL_TABLET | Freq: Every day | ORAL | 1 refills | Status: DC

## 2018-06-22 NOTE — Progress Notes (Signed)
New Patient Office Visit     CC/Reason for Visit: Establish care, follow-up on chronic medical conditions Previous PCP: None   HPI: Lindsay Brady is a 68 y.o. female who is coming in today for the above mentioned reasons. Past Medical History is significant for: Oxygen dependent COPD and interstitial lung disease followed by Dr. Sherene Sires, mild acid reflux for which she takes omeprazole as needed, morbid obesity, obstructive sleep apnea that is well controlled on CPAP, hyperlipidemia on statin with no recent LDL measurements.  She also has some anxiety issues and has been on Lexapro.  This controls her mood pretty well.  She has been off Lexapro for approximately 6 weeks and can notice a difference and would like this refilled today.  She would like to schedule visit for her annual exam.  She is interested in receiving her pneumonia vaccination today, she would like a referral to GI for screening colonoscopy and would like to schedule mammogram today.  She would prefer to have Pap smears done by GYN, requests referral.  She has no acute issues.   Past Medical/Surgical History: Past Medical History:  Diagnosis Date  . Anxiety   . Arthritis   . Cholecystitis 07/2014  . COPD (chronic obstructive pulmonary disease) (HCC)   . Diabetes mellitus without complication (HCC)    type 2   . Fracture 2013 ?   left leg  . GERD (gastroesophageal reflux disease)    hx of gerd  . Headache   . Hx: UTI (urinary tract infection)   . OA (osteoarthritis) of knee   . Pneumonia    hx of viral pna years ago  . Shortness of breath dyspnea   . Sleep apnea    uses cpap  . Urgency of urination     Past Surgical History:  Procedure Laterality Date  . ABDOMINAL HYSTERECTOMY    . CARPAL TUNNEL RELEASE    . CHOLECYSTECTOMY N/A 07/19/2014   Procedure: LAPAROSCOPIC CHOLECYSTECTOMY WITH INTRAOPERATIVE CHOLANGIOGRAM;  Surgeon: Violeta Gelinas, MD;  Location: [facility] OR;  Service: General;  Laterality: N/A;  .  COLONOSCOPY  ?   2011  . KNEE ARTHROSCOPY    . TONSILLECTOMY      Social History:  reports that she quit smoking about 22 years ago. Her smoking use included cigarettes. She has a 60.00 pack-year smoking history. She has never used smokeless tobacco. She reports that she does not drink alcohol or use drugs.  Allergies: Allergies  Allergen Reactions  . Erythromycin Other (See Comments)    Pain in stomach   . Bactrim [Sulfamethoxazole-Trimethoprim] Rash  . Fosamax  [Alendronate Sodium] Cough    After taking 1 pill, pt states that she had a cough.     Family History:  Family History  Problem Relation Age of Onset  . Stroke Mother   . Lung cancer Father        smoked  . Colon cancer Father   . Cancer Paternal Grandmother   . Cancer Paternal Grandfather      Current Outpatient Medications:  .  acetaminophen (TYLENOL) 500 MG tablet, Take 500 mg by mouth every 6 (six) hours as needed., Disp: , Rfl:  .  albuterol (PROVENTIL HFA;VENTOLIN HFA) 108 (90 Base) MCG/ACT inhaler, Inhale 2 puffs into the lungs every 6 (six) hours as needed for wheezing or shortness of breath., Disp: 1 Inhaler, Rfl: 1 .  aspirin EC 81 MG tablet, Take 81 mg by mouth at bedtime., Disp: , Rfl:  .  chlorpheniramine (CHLOR-TRIMETON) 4 MG tablet, Take 4 mg by mouth at bedtime., Disp: , Rfl:  .  Cholecalciferol (VITAMIN D) 2000 UNITS CAPS, Take 2,000 Units by mouth every evening., Disp: , Rfl:  .  cholestyramine light (PREVALITE) 4 g packet, Take 4 g by mouth daily as needed (cholesterol). , Disp: , Rfl:  .  escitalopram (LEXAPRO) 10 MG tablet, Take 1 tablet (10 mg total) by mouth at bedtime., Disp: 90 tablet, Rfl: 1 .  famotidine (PEPCID) 20 MG tablet, Take 20 mg by mouth at bedtime as needed for heartburn or indigestion., Disp: , Rfl:  .  gabapentin (NEURONTIN) 400 MG capsule, Take 400 mg by mouth at bedtime., Disp: , Rfl:  .  ibuprofen (ADVIL,MOTRIN) 800 MG tablet, Take 1 tablet (800 mg total) by mouth every 8  (eight) hours as needed., Disp: 90 tablet, Rfl: 0 .  OXYGEN, 2lpm with sleep and 3lpm with exertion AHC, Disp: , Rfl:  .  simvastatin (ZOCOR) 10 MG tablet, Take 10 mg by mouth every evening., Disp: , Rfl:  .  mirabegron ER (MYRBETRIQ) 50 MG TB24 tablet, Take 1 tablet (50 mg total) by mouth daily., Disp: 90 tablet, Rfl: 1 .  omeprazole (PRILOSEC) 20 MG capsule, Take 20 mg by mouth daily as needed., Disp: , Rfl:   Review of Systems:  Constitutional: Denies fever, chills, diaphoresis, appetite change and fatigue.  HEENT: Denies photophobia, eye pain, redness, hearing loss, ear pain, congestion, sore throat, rhinorrhea, sneezing, mouth sores, trouble swallowing, neck pain, neck stiffness and tinnitus.   Respiratory: Denies SOB, DOE, cough, chest tightness,  and wheezing.   Cardiovascular: Denies chest pain, palpitations and leg swelling.  Gastrointestinal: Denies nausea, vomiting, abdominal pain, diarrhea, constipation, blood in stool and abdominal distention.  Genitourinary: Denies dysuria, urgency, frequency, hematuria, flank pain and difficulty urinating.  Endocrine: Denies: hot or cold intolerance, sweats, changes in hair or nails, polyuria, polydipsia. Musculoskeletal: Denies myalgias, back pain, joint swelling, arthralgias and gait problem.  Skin: Denies pallor, rash and wound.  Neurological: Denies dizziness, seizures, syncope, weakness, light-headedness, numbness and headaches.  Hematological: Denies adenopathy. Easy bruising, personal or family bleeding history  Psychiatric/Behavioral: Denies suicidal ideation, mood changes, confusion, nervousness, sleep disturbance and agitation    Physical Exam: Vitals:   06/22/18 1347  BP: 124/80  Pulse: 77  Temp: 98.2 F (36.8 C)  TempSrc: Oral  SpO2: 94%  Weight: 282 lb (127.9 kg)  Height: 5\' 5"  (1.651 m)   Body mass index is 46.93 kg/m.  Constitutional: NAD, calm, comfortable Eyes: PERRL, lids and conjunctivae normal ENMT: Mucous  membranes are moist.  Respiratory: clear to auscultation bilaterally, no wheezing, no crackles. Normal respiratory effort. No accessory muscle use.  Cardiovascular: Regular rate and rhythm, no murmurs / rubs / gallops. No extremity edema. 2+ pedal pulses. No carotid bruits.  Musculoskeletal: no clubbing / cyanosis. No joint deformity upper and lower extremities. Good ROM, no contractures. Normal muscle tone.  Neurologic: CN 2-12 grossly intact. Sensation intact, DTR normal. Strength 5/5 in all 4.  Psychiatric: Normal judgment and insight. Alert and oriented x 3. Normal mood.    Impression and Plan:  Screening for colon cancer - Plan: Ambulatory referral to Gastroenterology  Screening for breast cancer - Plan: MM Digital Screening  Need for pneumococcal vaccination - Plan: Pneumococcal conjugate vaccine 13-valent  Type 2 diabetes mellitus without complication, without long-term current use of insulin (HCC) -This is currently diet-controlled. -She tells me that her most recent A1c was 6.5 although I do  not have documentation of this. -She will schedule return visit as soon as possible for annual physical, at that time we will check A1c.  Morbid obesity (HCC) -BMI is 46.9. -Patient admits to being very sedentary since it is difficult to carry her oxygen tanks around. -We have discussed lifestyle modifications including increased physical activity and better food choices in order to facilitate weight loss. -May consider referral to the healthy weight and wellness clinic at next visit.  COPD GOLD 0 ILD (interstitial lung disease) (HCC)  OSA on CPAP -Oxygen dependent, follows with pulmonary, Dr. Sherene SiresWert.  Anxiety  -Will refill Lexapro today.     Patient Instructions  -It was nice meeting you today!  -Schedule appointment as soon as possible for your wellness visit/annual physical. Please come in fasting to that appointment.  -Lexapro refills will be sent.     Chaya JanEstela Hernandez  [place], MD [facility]

## 2018-06-22 NOTE — Patient Instructions (Signed)
-  It was nice meeting you today!  -Schedule appointment as soon as possible for your wellness visit/annual physical. Please come in fasting to that appointment.  -Lexapro refills will be sent.

## 2018-06-28 ENCOUNTER — Ambulatory Visit (INDEPENDENT_AMBULATORY_CARE_PROVIDER_SITE_OTHER): Payer: Medicare Other | Admitting: Internal Medicine

## 2018-06-28 ENCOUNTER — Other Ambulatory Visit: Payer: Self-pay | Admitting: Internal Medicine

## 2018-06-28 ENCOUNTER — Encounter: Payer: Self-pay | Admitting: Internal Medicine

## 2018-06-28 VITALS — BP 110/68 | HR 66 | Temp 98.4°F | Ht 64.5 in | Wt 281.1 lb

## 2018-06-28 DIAGNOSIS — E119 Type 2 diabetes mellitus without complications: Secondary | ICD-10-CM | POA: Diagnosis not present

## 2018-06-28 DIAGNOSIS — G4733 Obstructive sleep apnea (adult) (pediatric): Secondary | ICD-10-CM

## 2018-06-28 DIAGNOSIS — Z13820 Encounter for screening for osteoporosis: Secondary | ICD-10-CM

## 2018-06-28 DIAGNOSIS — Z0000 Encounter for general adult medical examination without abnormal findings: Secondary | ICD-10-CM

## 2018-06-28 DIAGNOSIS — E785 Hyperlipidemia, unspecified: Principal | ICD-10-CM

## 2018-06-28 DIAGNOSIS — J9611 Chronic respiratory failure with hypoxia: Secondary | ICD-10-CM

## 2018-06-28 DIAGNOSIS — J449 Chronic obstructive pulmonary disease, unspecified: Secondary | ICD-10-CM | POA: Diagnosis not present

## 2018-06-28 DIAGNOSIS — Z9989 Dependence on other enabling machines and devices: Secondary | ICD-10-CM

## 2018-06-28 DIAGNOSIS — Z1159 Encounter for screening for other viral diseases: Secondary | ICD-10-CM

## 2018-06-28 DIAGNOSIS — E1169 Type 2 diabetes mellitus with other specified complication: Secondary | ICD-10-CM

## 2018-06-28 LAB — COMPREHENSIVE METABOLIC PANEL
ALT: 18 U/L (ref 0–35)
AST: 20 U/L (ref 0–37)
Albumin: 4 g/dL (ref 3.5–5.2)
Alkaline Phosphatase: 68 U/L (ref 39–117)
BUN: 13 mg/dL (ref 6–23)
CO2: 30 meq/L (ref 19–32)
Calcium: 9.3 mg/dL (ref 8.4–10.5)
Chloride: 100 mEq/L (ref 96–112)
Creatinine, Ser: 0.78 mg/dL (ref 0.40–1.20)
GFR: 73.59 mL/min (ref >60.00)
Glucose, Bld: 118 mg/dL — ABNORMAL HIGH (ref 70–99)
Potassium: 4.6 mEq/L (ref 3.5–5.1)
Sodium: 136 mEq/L (ref 135–145)
Total Bilirubin: 0.6 mg/dL (ref 0.2–1.2)
Total Protein: 6.5 g/dL (ref 6.0–8.3)

## 2018-06-28 LAB — CBC WITH DIFFERENTIAL/PLATELET
Basophils Absolute: 0 10*3/uL (ref 0.0–0.1)
Basophils Relative: 0.5 % (ref 0.0–3.0)
Eosinophils Absolute: 0.1 10*3/uL (ref 0.0–0.7)
Eosinophils Relative: 2.3 % (ref 0.0–5.0)
HEMATOCRIT: 40.1 % (ref 36.0–46.0)
Hemoglobin: 13.4 g/dL (ref 12.0–15.0)
Lymphocytes Relative: 35.3 % (ref 12.0–46.0)
Lymphs Abs: 1.9 10*3/uL (ref 0.7–4.0)
MCHC: 33.5 g/dL (ref 30.0–36.0)
MCV: 94.7 fl (ref 78.0–100.0)
Monocytes Absolute: 0.5 10*3/uL (ref 0.1–1.0)
Monocytes Relative: 9.4 % (ref 3.0–12.0)
Neutro Abs: 2.8 10*3/uL (ref 1.4–7.7)
Neutrophils Relative %: 52.5 % (ref 43.0–77.0)
Platelets: 221 10*3/uL (ref 150.0–400.0)
RBC: 4.24 Mil/uL (ref 3.87–5.11)
RDW: 13.9 % (ref 11.5–15.5)
WBC: 5.4 10*3/uL (ref 4.0–10.5)

## 2018-06-28 LAB — MICROALBUMIN / CREATININE URINE RATIO
Creatinine,U: 56.1 mg/dL
Microalb Creat Ratio: 1.9 mg/g (ref 0.0–30.0)
Microalb, Ur: 1 mg/dL (ref 0.0–1.9)

## 2018-06-28 LAB — HEMOGLOBIN A1C: Hgb A1c MFr Bld: 6.4 % (ref 4.6–6.5)

## 2018-06-28 LAB — TSH: TSH: 3.11 u[IU]/mL (ref 0.35–4.50)

## 2018-06-28 LAB — LIPID PANEL
CHOL/HDL RATIO: 3
Cholesterol: 148 mg/dL (ref 0–200)
HDL: 44.6 mg/dL (ref >39.00)
LDL CALC: 83 mg/dL (ref 0–99)
NONHDL: 103.67
Triglycerides: 104 mg/dL (ref 0.0–149.0)
VLDL: 20.8 mg/dL (ref 0.0–40.0)

## 2018-06-28 LAB — VITAMIN D 25 HYDROXY (VIT D DEFICIENCY, FRACTURES): VITD: 41.01 ng/mL (ref 30.00–100.00)

## 2018-06-28 LAB — VITAMIN B12: Vitamin B-12: 270 pg/mL (ref 211–911)

## 2018-06-28 MED ORDER — SIMVASTATIN 20 MG PO TABS: 20.0000 mg | ORAL_TABLET | Freq: Every day | ORAL | 3 refills | Status: DC

## 2018-06-28 NOTE — Patient Instructions (Addendum)
-It was nice seeing you today!  -Lab work today. Will notify you when results are available.  -Make sure you schedule eye and dental care.  -Schedule follow up in 3 months for continued diabetic management.   Preventive Care 68 Years and Older, Female Preventive care refers to lifestyle choices and visits with your health care provider that can promote health and wellness. What does preventive care include?  A yearly physical exam. This is also called an annual well check.  Dental exams once or twice a year.  Routine eye exams. Ask your health care provider how often you should have your eyes checked.  Personal lifestyle choices, including: ? Daily care of your teeth and gums. ? Regular physical activity. ? Eating a healthy diet. ? Avoiding tobacco and drug use. ? Limiting alcohol use. ? Practicing safe sex. ? Taking low-dose aspirin every day. ? Taking vitamin and mineral supplements as recommended by your health care provider. What happens during an annual well check? The services and screenings done by your health care provider during your annual well check will depend on your age, overall health, lifestyle risk factors, and family history of disease. Counseling Your health care provider may ask you questions about your:  Alcohol use.  Tobacco use.  Drug use.  Emotional well-being.  Home and relationship well-being.  Sexual activity.  Eating habits.  History of falls.  Memory and ability to understand (cognition).  Work and work Statistician.  Reproductive health.  Screening You may have the following tests or measurements:  Height, weight, and BMI.  Blood pressure.  Lipid and cholesterol levels. These may be checked every 5 years, or more frequently if you are over 68 years old.  Skin check.  Lung cancer screening. You may have this screening every year starting at age 68 if you have a 30-pack-year history of smoking and currently smoke or have  quit within the past 15 years.  Colorectal cancer screening. All adults should have this screening starting at age 68 and continuing until age 33. You will have tests every 1-10 years, depending on your results and the type of screening test. People at increased risk should start screening at an earlier age. Screening tests may include: ? Guaiac-based fecal occult blood testing. ? Fecal immunochemical test (FIT). ? Stool DNA test. ? Virtual colonoscopy. ? Sigmoidoscopy. During this test, a flexible tube with a tiny camera (sigmoidoscope) is used to examine your rectum and lower colon. The sigmoidoscope is inserted through your anus into your rectum and lower colon. ? Colonoscopy. During this test, a long, thin, flexible tube with a tiny camera (colonoscope) is used to examine your entire colon and rectum.  Hepatitis C blood test.  Hepatitis B blood test.  Sexually transmitted disease (STD) testing.  Diabetes screening. This is done by checking your blood sugar (glucose) after you have not eaten for a while (fasting). You may have this done every 1-3 years.  Bone density scan. This is done to screen for osteoporosis. You may have this done starting at age 68.  Mammogram. This may be done every 1-2 years. Talk to your health care provider about how often you should have regular mammograms. Talk with your health care provider about your test results, treatment options, and if necessary, the need for more tests. Vaccines Your health care provider may recommend certain vaccines, such as:  Influenza vaccine. This is recommended every year.  Tetanus, diphtheria, and acellular pertussis (Tdap, Td) vaccine. You may need a Td  booster every 10 years.  Varicella vaccine. You may need this if you have not been vaccinated.  Zoster vaccine. You may need this after age 68.  Measles, mumps, and rubella (MMR) vaccine. You may need at least one dose of MMR if you were born in 1957 or later. You may  also need a second dose.  Pneumococcal 13-valent conjugate (PCV13) vaccine. One dose is recommended after age 68.  Pneumococcal polysaccharide (PPSV23) vaccine. One dose is recommended after age 68.  Meningococcal vaccine. You may need this if you have certain conditions.  Hepatitis A vaccine. You may need this if you have certain conditions or if you travel or work in places where you may be exposed to hepatitis A.  Hepatitis B vaccine. You may need this if you have certain conditions or if you travel or work in places where you may be exposed to hepatitis B.  Haemophilus influenzae type b (Hib) vaccine. You may need this if you have certain conditions. Talk to your health care provider about which screenings and vaccines you need and how often you need them. This information is not intended to replace advice given to you by your health care provider. Make sure you discuss any questions you have with your health care provider. Document Released: 06/13/2015 Document Revised: 07/07/2017 Document Reviewed: 03/18/2015 Elsevier Interactive Patient Education  2019 [employer].

## 2018-06-28 NOTE — Progress Notes (Signed)
Established Patient Office Visit     CC/Reason for Visit: Subsequent Medicare visit and annual physical exam  HPI: Lindsay Brady is a 68 y.o. female who is coming in today for the above mentioned reasons. Past Medical History is significant for: Oxygen dependent COPD and interstitial lung disease followed by Dr. Melvyn Novas, acid reflux stable on omeprazole, hyperlipidemia on statin, anxiety on Lexapro.  She is up-to-date on immunizations.  DEXA scan to be scheduled for osteoporosis screening.  She has been recommended to have routine eye and dental care.  Will schedule mammogram, GI for follow-up colonoscopy, she prefers to have Pap smears with her GYN.  We have discussed lifestyle modifications including increased physical activity and healthy food choices in order to encourage weight loss.  Subsequent Medicare wellness visit   1. Risk factors, based on past  M,S,F -cardiovascular disease risk factors include obesity, diabetes.   2.  Physical activities: She is very sedentary and does not do much in the way of physical activity.  Have encouraged at least 30 minutes of aerobic exercise 3 times a week.   3.  Depression/mood:  Her mood is slightly depressed, improved since starting Lexapro 2 weeks ago.  She does not feel like it is at a point where she needs to see a psychiatrist/counselor   4.  Hearing:  No issues   5.  ADL's: Independent   6.  Fall risk:  Low   7.  Home safety: No problems identified   8.  Height weight, and visual acuity: Height and weight as documented in chart, will have eye exams to document visual acuity   9.  Counseling:  Have advised to increase physical activity, counseled on lifestyle modifications to achieve weight loss, continued medication compliance and compliance with MD appointments.   10. Lab orders based on risk factors: Laboratory update will be reviewed   11. Referral :  GI for screening colonoscopy, ophthalmology for diabetic eye  exam, DDS for dental care, GYN for cervical cancer screening per patient preference   12. Care plan:  Referral for age-appropriate screening   13. Cognitive assessment:  No cognitive impairment   14. Screening: Patient provided with a written and personalized 5-10 year screening schedule in the AVS.   yes   15. Provider List Update:   GI, ophthalmology, GYN    Past Medical/Surgical History: Past Medical History:  Diagnosis Date  . Anxiety   . Arthritis   . Cholecystitis 07/2014  . COPD (chronic obstructive pulmonary disease) ([place])   . Diabetes mellitus without complication ([place])    type 2   . Fracture 2013 ?   left leg  . GERD (gastroesophageal reflux disease)    hx of gerd  . Headache   . Hx: UTI (urinary tract infection)   . OA (osteoarthritis) of knee   . Pneumonia    hx of viral pna years ago  . Shortness of breath dyspnea   . Sleep apnea    uses cpap  . Urgency of urination     Past Surgical History:  Procedure Laterality Date  . ABDOMINAL HYSTERECTOMY    . CARPAL TUNNEL RELEASE    . CHOLECYSTECTOMY N/A 07/19/2014   Procedure: LAPAROSCOPIC CHOLECYSTECTOMY WITH INTRAOPERATIVE CHOLANGIOGRAM;  Surgeon: Georganna Skeans, MD;  Location: [facility];  Service: General;  Laterality: N/A;  . COLONOSCOPY  ?   2011  . KNEE ARTHROSCOPY    . TONSILLECTOMY      Social History:  reports that  she quit smoking about 22 years ago. Her smoking use included cigarettes. She has a 60.00 pack-year smoking history. She has never used smokeless tobacco. She reports that she does not drink alcohol or use drugs.  Allergies: Allergies  Allergen Reactions  . Erythromycin Other (See Comments)    Pain in stomach   . Bactrim [Sulfamethoxazole-Trimethoprim] Rash  . Fosamax  [Alendronate Sodium] Cough    After taking 1 pill, pt states that she had a cough.     Family History:  Family History  Problem Relation Age of Onset  . Stroke Mother   . Lung cancer Father        smoked  . Colon  cancer Father   . Cancer Paternal Grandmother   . Cancer Paternal Grandfather      Current Outpatient Medications:  .  acetaminophen (TYLENOL) 500 MG tablet, Take 500 mg by mouth every 6 (six) hours as needed., Disp: , Rfl:  .  albuterol (PROVENTIL HFA;VENTOLIN HFA) 108 (90 Base) MCG/ACT inhaler, Inhale 2 puffs into the lungs every 6 (six) hours as needed for wheezing or shortness of breath., Disp: 1 Inhaler, Rfl: 1 .  aspirin EC 81 MG tablet, Take 81 mg by mouth at bedtime., Disp: , Rfl:  .  chlorpheniramine (CHLOR-TRIMETON) 4 MG tablet, Take 4 mg by mouth at bedtime., Disp: , Rfl:  .  Cholecalciferol (VITAMIN D) 2000 UNITS CAPS, Take 2,000 Units by mouth every evening., Disp: , Rfl:  .  cholestyramine light (PREVALITE) 4 g packet, Take 4 g by mouth daily as needed (cholesterol). , Disp: , Rfl:  .  escitalopram (LEXAPRO) 10 MG tablet, Take 1 tablet (10 mg total) by mouth at bedtime., Disp: 90 tablet, Rfl: 1 .  famotidine (PEPCID) 20 MG tablet, Take 20 mg by mouth at bedtime as needed for heartburn or indigestion., Disp: , Rfl:  .  gabapentin (NEURONTIN) 400 MG capsule, Take 400 mg by mouth at bedtime., Disp: , Rfl:  .  ibuprofen (ADVIL,MOTRIN) 800 MG tablet, Take 1 tablet (800 mg total) by mouth every 8 (eight) hours as needed., Disp: 90 tablet, Rfl: 0 .  mirabegron ER (MYRBETRIQ) 50 MG TB24 tablet, Take 1 tablet (50 mg total) by mouth daily., Disp: 90 tablet, Rfl: 1 .  omeprazole (PRILOSEC) 20 MG capsule, Take 20 mg by mouth daily as needed., Disp: , Rfl:  .  OXYGEN, 2lpm with sleep and 3lpm with exertion AHC, Disp: , Rfl:  .  simvastatin (ZOCOR) 10 MG tablet, Take 10 mg by mouth every evening., Disp: , Rfl:   Review of Systems:  Constitutional: Denies fever, chills, diaphoresis, appetite change and fatigue.  HEENT: Denies photophobia, eye pain, redness, hearing loss, ear pain, congestion, sore throat, rhinorrhea, sneezing, mouth sores, trouble swallowing, neck pain, neck stiffness and  tinnitus.   Respiratory: Denies SOB, DOE, cough, chest tightness,  and wheezing.   Cardiovascular: Denies chest pain, palpitations and leg swelling.  Gastrointestinal: Denies nausea, vomiting, abdominal pain, diarrhea, constipation, blood in stool and abdominal distention.  Genitourinary: Denies dysuria, urgency, frequency, hematuria, flank pain and difficulty urinating.  Endocrine: Denies: hot or cold intolerance, sweats, changes in hair or nails, polyuria, polydipsia. Musculoskeletal: Denies myalgias, back pain, joint swelling, arthralgias and gait problem.  Skin: Denies pallor, rash and wound.  Neurological: Denies dizziness, seizures, syncope, weakness, light-headedness, numbness and headaches.  Hematological: Denies adenopathy. Easy bruising, personal or family bleeding history  Psychiatric/Behavioral: Denies suicidal ideation, mood changes, confusion, nervousness, sleep disturbance and agitation  Physical Exam: Vitals:   06/28/18 1029  BP: 110/68  Pulse: 66  Temp: 98.4 F (36.9 C)  TempSrc: Oral  SpO2: 94%  Weight: 281 lb 1.6 oz (127.5 kg)  Height: 5' 4.5" (1.638 m)    Body mass index is 47.51 kg/m.   Constitutional: NAD, calm, comfortable Eyes: PERRL, lids and conjunctivae normal ENMT: Mucous membranes are moist. Posterior pharynx clear of any exudate or lesions. Normal dentition. Tympanic membrane is pearly white, no erythema or bulging. Neck: normal, supple, no masses, no thyromegaly Respiratory: clear to auscultation bilaterally, no wheezing, no crackles. Normal respiratory effort. No accessory muscle use.  Cardiovascular: Regular rate and rhythm, no murmurs / rubs / gallops. No extremity edema. 2+ pedal pulses. No carotid bruits.  Abdomen: no tenderness, no masses palpated. No hepatosplenomegaly. Bowel sounds positive.  Musculoskeletal: no clubbing / cyanosis. No joint deformity upper and lower extremities. Good ROM, no contractures. Normal muscle tone.  Skin: no  rashes, lesions, ulcers. No induration Neurologic: CN 2-12 grossly intact. Sensation intact, DTR normal. Strength 5/5 in all 4.  Psychiatric: Normal judgment and insight. Alert and oriented x 3. Normal mood.    Impression and Plan:  Encounter for preventive health examination -Up-to-date on age-appropriate immunizations. -Referrals have been made for age-appropriate cancer screening. -We have discussed lifestyle modifications to achieve weight loss and improve cardiovascular health. -Lab work has been requested today. -Have recommended routine eye and dental care.  Chronic respiratory failure with hypoxia (HCC), Chronic COPD GOLD 0 OSA on CPAP -Followed by pulmonary  Morbid obesity due to excess calories (HCC)   Type 2 diabetes mellitus without complication, without long-term current use of insulin (HCC)  -A1c to be requested today, further recommendations pending those results.  Screening for osteoporosis  -Bone density test to be requested today.  Encounter for hepatitis C screening test for low risk patient - Plan: Hep C Antibody    Patient Instructions  -It was nice seeing you today!  -Lab work today. Will notify you when results are available.  -Make sure you schedule eye and dental care.  -Schedule follow up in 3 months for continued diabetic management.   Preventive Care 48 Years and Older, Female Preventive care refers to lifestyle choices and visits with your health care provider that can promote health and wellness. What does preventive care include?  A yearly physical exam. This is also called an annual well check.  Dental exams once or twice a year.  Routine eye exams. Ask your health care provider how often you should have your eyes checked.  Personal lifestyle choices, including: ? Daily care of your teeth and gums. ? Regular physical activity. ? Eating a healthy diet. ? Avoiding tobacco and drug use. ? Limiting alcohol use. ? Practicing safe  sex. ? Taking low-dose aspirin every day. ? Taking vitamin and mineral supplements as recommended by your health care provider. What happens during an annual well check? The services and screenings done by your health care provider during your annual well check will depend on your age, overall health, lifestyle risk factors, and family history of disease. Counseling Your health care provider may ask you questions about your:  Alcohol use.  Tobacco use.  Drug use.  Emotional well-being.  Home and relationship well-being.  Sexual activity.  Eating habits.  History of falls.  Memory and ability to understand (cognition).  Work and work Statistician.  Reproductive health.  Screening You may have the following tests or measurements:  Height, weight, and BMI.  Blood pressure.  Lipid and cholesterol levels. These may be checked every 5 years, or more frequently if you are over 29 years old.  Skin check.  Lung cancer screening. You may have this screening every year starting at age 3 if you have a 30-pack-year history of smoking and currently smoke or have quit within the past 15 years.  Colorectal cancer screening. All adults should have this screening starting at age 61 and continuing until age 19. You will have tests every 1-10 years, depending on your results and the type of screening test. People at increased risk should start screening at an earlier age. Screening tests may include: ? Guaiac-based fecal occult blood testing. ? Fecal immunochemical test (FIT). ? Stool DNA test. ? Virtual colonoscopy. ? Sigmoidoscopy. During this test, a flexible tube with a tiny camera (sigmoidoscope) is used to examine your rectum and lower colon. The sigmoidoscope is inserted through your anus into your rectum and lower colon. ? Colonoscopy. During this test, a long, thin, flexible tube with a tiny camera (colonoscope) is used to examine your entire colon and rectum.  Hepatitis C  blood test.  Hepatitis B blood test.  Sexually transmitted disease (STD) testing.  Diabetes screening. This is done by checking your blood sugar (glucose) after you have not eaten for a while (fasting). You may have this done every 1-3 years.  Bone density scan. This is done to screen for osteoporosis. You may have this done starting at age 49.  Mammogram. This may be done every 1-2 years. Talk to your health care provider about how often you should have regular mammograms. Talk with your health care provider about your test results, treatment options, and if necessary, the need for more tests. Vaccines Your health care provider may recommend certain vaccines, such as:  Influenza vaccine. This is recommended every year.  Tetanus, diphtheria, and acellular pertussis (Tdap, Td) vaccine. You may need a Td booster every 10 years.  Varicella vaccine. You may need this if you have not been vaccinated.  Zoster vaccine. You may need this after age 45.  Measles, mumps, and rubella (MMR) vaccine. You may need at least one dose of MMR if you were born in 1957 or later. You may also need a second dose.  Pneumococcal 13-valent conjugate (PCV13) vaccine. One dose is recommended after age 25.  Pneumococcal polysaccharide (PPSV23) vaccine. One dose is recommended after age 14.  Meningococcal vaccine. You may need this if you have certain conditions.  Hepatitis A vaccine. You may need this if you have certain conditions or if you travel or work in places where you may be exposed to hepatitis A.  Hepatitis B vaccine. You may need this if you have certain conditions or if you travel or work in places where you may be exposed to hepatitis B.  Haemophilus influenzae type b (Hib) vaccine. You may need this if you have certain conditions. Talk to your health care provider about which screenings and vaccines you need and how often you need them. This information is not intended to replace advice given  to you by your health care provider. Make sure you discuss any questions you have with your health care provider. Document Released: 06/13/2015 Document Revised: 07/07/2017 Document Reviewed: 03/18/2015 Elsevier Interactive Patient Education  2019 [place], [place] [facility]

## 2018-06-29 LAB — HEPATITIS C ANTIBODY
Hepatitis C Ab: NONREACTIVE
SIGNAL TO CUT-OFF: 0.03 (ref <1.00)

## 2018-07-25 ENCOUNTER — Ambulatory Visit
Admission: RE | Admit: 2018-07-25 | Discharge: 2018-07-25 | Disposition: A | Discharge status: Home / Self Care | Payer: Medicare Other | Source: Ambulatory Visit | Attending: Internal Medicine | Admitting: Internal Medicine

## 2018-07-25 DIAGNOSIS — Z1239 Encounter for other screening for malignant neoplasm of breast: Secondary | ICD-10-CM

## 2018-08-24 ENCOUNTER — Other Ambulatory Visit: Payer: Self-pay

## 2018-08-24 ENCOUNTER — Ambulatory Visit (INDEPENDENT_AMBULATORY_CARE_PROVIDER_SITE_OTHER): Payer: Medicare Other | Admitting: Internal Medicine

## 2018-08-24 DIAGNOSIS — B9789 Other viral agents as the cause of diseases classified elsewhere: Secondary | ICD-10-CM | POA: Diagnosis not present

## 2018-08-24 DIAGNOSIS — J069 Acute upper respiratory infection, unspecified: Secondary | ICD-10-CM

## 2018-08-24 NOTE — Progress Notes (Signed)
Virtual Visit via Video Note  I connected with Lindsay Brady on 08/24/18 at 10:00 AM EDT by a video enabled telemedicine application and verified that I am speaking with the correct person using two identifiers.  Location patient: home Location provider: work office Persons participating in the virtual visit: patient, provider  I discussed the limitations of evaluation and management by telemedicine and the availability of in person appointments. The patient expressed understanding and agreed to proceed.   HPI: Lindsay Brady has made this sick visit due to URI symptoms that she is experiencing.  She has had a mild sore throat for the past week that has now progressed.  She feels a "lump" at the left side of her neck.  She has been very vigilant checking her temperatures and she has been afebrile, last 2 recent temperatures are 98.6 and 97.3.  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  Lindsay Brady has COPD and has shortness of breath at baseline, she however states that her shortness of breath is not increased above baseline, she does not have ear pain or itchy eyes.     ROS: Constitutional: Denies fever, chills, diaphoresis, appetite change and fatigue.  HEENT: Denies photophobia, eye pain, redness, hearing loss, ear pain, congestion, rhinorrhea, sneezing, mouth sores, trouble swallowing, neck pain, neck stiffness and tinnitus.   Respiratory: Denies shortness of breath above her baseline, cough, chest tightness,  and wheezing.   Cardiovascular: Denies chest pain, palpitations and leg swelling.  Gastrointestinal: Denies nausea, vomiting, abdominal pain, diarrhea,  constipation, blood in stool and abdominal distention.  Genitourinary: Denies dysuria, urgency, frequency, hematuria, flank pain and difficulty urinating.  Endocrine: Denies: hot or cold intolerance, sweats, changes in hair or nails, polyuria, polydipsia. Musculoskeletal: Denies myalgias, back pain, joint swelling, arthralgias and gait problem.  Skin: Denies pallor, rash and wound.  Neurological: Denies dizziness, seizures, syncope, weakness, light-headedness, numbness and headaches.  Hematological: Denies adenopathy. Easy bruising, personal or family bleeding history  Psychiatric/Behavioral: Denies suicidal ideation, mood changes, confusion, nervousness, sleep disturbance and agitation   Past Medical History:  Diagnosis Date   Anxiety    Arthritis    Cholecystitis 07/2014   COPD (chronic obstructive pulmonary disease) (HCC)    Diabetes mellitus without complication (HCC)    type 2    Fracture 2013 ?   left leg   GERD (gastroesophageal reflux disease)    hx of gerd   Headache    Hx: UTI (urinary tract infection)    OA (osteoarthritis) of knee    Pneumonia    hx of viral pna years ago   Shortness of breath dyspnea    Sleep apnea    uses cpap   Urgency of urination     Past Surgical History:  Procedure Laterality Date   ABDOMINAL HYSTERECTOMY     CARPAL TUNNEL RELEASE     CHOLECYSTECTOMY N/A 07/19/2014   Procedure: LAPAROSCOPIC CHOLECYSTECTOMY WITH INTRAOPERATIVE CHOLANGIOGRAM;  Surgeon: Violeta Gelinas, MD;  Location: [facility];  Service: General;  Laterality: N/A;   COLONOSCOPY  ?   2011   KNEE ARTHROSCOPY     TONSILLECTOMY      Family History  Problem Relation Age of Onset   Stroke Mother    Lung cancer Father        smoked   Colon cancer Father    Cancer Paternal Grandmother    Cancer Paternal Grandfather     SOCIAL HX:   reports that she quit smoking about 22 years ago. Her smoking use included cigarettes. She has a 60.00 pack-year smoking  history. She has never used smokeless tobacco. She reports that she does not drink alcohol or use drugs.   Current Outpatient Medications:    acetaminophen (TYLENOL) 500 MG tablet, Take 500 mg by mouth every 6 (six) hours as needed., Disp: , Rfl:    albuterol (PROVENTIL HFA;VENTOLIN HFA) 108 (90 Base) MCG/ACT inhaler, Inhale 2 puffs into the lungs every 6 (six) hours as needed for wheezing or shortness of breath., Disp: 1 Inhaler, Rfl: 1   aspirin EC 81 MG tablet, Take 81 mg by mouth at bedtime., Disp: , Rfl:    chlorpheniramine (CHLOR-TRIMETON) 4 MG tablet, Take 4 mg by mouth at bedtime., Disp: , Rfl:    Cholecalciferol (VITAMIN D) 2000 UNITS CAPS, Take 2,000 Units by mouth every evening., Disp: , Rfl:    cholestyramine light (PREVALITE) 4 g packet, Take 4 g by mouth daily as needed (cholesterol). , Disp: , Rfl:    escitalopram (LEXAPRO) 10 MG tablet, Take 1 tablet (10 mg total) by mouth at bedtime., Disp: 90 tablet, Rfl: 1   famotidine (PEPCID) 20 MG tablet, Take 20 mg by mouth at bedtime as needed for heartburn or indigestion., Disp: , Rfl:    gabapentin (NEURONTIN) 400 MG capsule, Take 400 mg by mouth at bedtime., Disp: , Rfl:    ibuprofen (ADVIL,MOTRIN) 800 MG tablet, Take 1 tablet (800 mg total) by mouth every 8 (eight) hours as needed., Disp: 90 tablet, Rfl: 0   mirabegron ER (MYRBETRIQ) 50 MG TB24 tablet, Take 1 tablet (50 mg total) by mouth daily., Disp: 90 tablet, Rfl: 1   omeprazole (PRILOSEC) 20 MG capsule, Take 20 mg by mouth daily as needed., Disp: , Rfl:    OXYGEN, 2lpm with sleep and 3lpm with exertion AHC, Disp: , Rfl:    simvastatin (ZOCOR) 20 MG tablet, Take 1 tablet (20 mg total) by mouth at bedtime., Disp: 90 tablet, Rfl: 3  EXAM:   VITALS per patient if applicable: She has reported a temperature of 98.6 and 97.3 in the past 2 days, no other vital signs are available to me at this time.  GENERAL: alert, oriented, appears well and in no acute  distress  HEENT: atraumatic, conjunttiva clear, no obvious abnormalities on inspection of external nose and ears.  I have looked at her throat via camera and with patient assistance with a flashlight, she does not have pharyngeal erythema or exudates that are appreciable to my eyes or to hers.  NECK: normal movements of the head and neck, no appreciable lymphadenopathy although patient states that she has a lump corresponding to where her anterior cervical lymph nodes would be.  LUNGS: on inspection no signs of respiratory distress, breathing rate appears normal, no obvious gross increased work of breathing, gasping or wheezing  CV: no obvious cyanosis  MS: moves all visible extremities without noticeable abnormality  PSYCH/NEURO: pleasant and cooperative, no obvious depression or anxiety, speech and thought processing grossly intact  ASSESSMENT AND PLAN:   Viral URI with cough -Given limited exam findings, PNA, pharyngitis, ear infection are not likely, hence abx have not been prescribed. -Have  advised rest, fluids, OTC antihistamines, cough suppressants and mucinex. -Have advised Tylenol instead of NSAIDs for pain relief. -Have asked her to notify us if she develops any fevers, but if she were to develop shortness of breath above her baseline have requested that she proceed to the emergency department for evaluation.    I discussed the assessment and treatment plan with the patient. The patient was provided an opportunity to ask questions and all were answered. The patient agreed with the plan and demonstrated an understanding of the instructions.   The patient was advised to call back or seek an in-person evaluation if the symptoms worsen or if the condition fails to improve as anticipated.  I provided 18 minutes of non-face-to-face time during this encounter.   Chaya Jan, MD  [facility] at [facility]

## 2018-08-29 ENCOUNTER — Telehealth: Payer: Self-pay | Admitting: *Deleted

## 2018-08-29 NOTE — Telephone Encounter (Signed)
Lindsay Brady with patient and she states she's "all better".  WebEx follow up visit declined at this time.  She will call back if needed.

## 2018-09-27 ENCOUNTER — Ambulatory Visit: Payer: Medicare Other | Admitting: Internal Medicine

## 2018-10-10 ENCOUNTER — Ambulatory Visit: Payer: Medicare Other | Admitting: Internal Medicine

## 2018-10-30 ENCOUNTER — Encounter: Payer: Self-pay | Admitting: Internal Medicine

## 2018-12-10 ENCOUNTER — Other Ambulatory Visit: Payer: Self-pay | Admitting: Internal Medicine

## 2018-12-10 DIAGNOSIS — F419 Anxiety disorder, unspecified: Secondary | ICD-10-CM

## 2019-01-03 ENCOUNTER — Ambulatory Visit: Payer: Medicare Other | Admitting: Internal Medicine

## 2019-01-08 ENCOUNTER — Ambulatory Visit: Payer: Medicare Other | Admitting: Internal Medicine

## 2019-02-21 ENCOUNTER — Other Ambulatory Visit: Payer: Self-pay | Admitting: Internal Medicine

## 2019-02-21 ENCOUNTER — Encounter: Payer: Self-pay | Admitting: Internal Medicine

## 2019-02-21 DIAGNOSIS — N959 Unspecified menopausal and perimenopausal disorder: Secondary | ICD-10-CM

## 2019-02-21 DIAGNOSIS — F419 Anxiety disorder, unspecified: Secondary | ICD-10-CM

## 2019-02-21 MED ORDER — ESCITALOPRAM OXALATE 10 MG PO TABS: ORAL_TABLET | ORAL | 1 refills | Status: DC

## 2019-03-09 ENCOUNTER — Other Ambulatory Visit: Payer: Self-pay

## 2019-03-09 ENCOUNTER — Encounter: Payer: Self-pay | Admitting: Internal Medicine

## 2019-03-09 ENCOUNTER — Ambulatory Visit: Payer: Medicare Other | Admitting: Internal Medicine

## 2019-03-09 VITALS — BP 132/66 | HR 70 | Temp 97.8°F | Wt 257.5 lb

## 2019-03-09 DIAGNOSIS — Z23 Encounter for immunization: Secondary | ICD-10-CM

## 2019-03-09 DIAGNOSIS — F411 Generalized anxiety disorder: Secondary | ICD-10-CM | POA: Diagnosis not present

## 2019-03-09 DIAGNOSIS — E1142 Type 2 diabetes mellitus with diabetic polyneuropathy: Secondary | ICD-10-CM

## 2019-03-09 DIAGNOSIS — J849 Interstitial pulmonary disease, unspecified: Secondary | ICD-10-CM | POA: Diagnosis not present

## 2019-03-09 DIAGNOSIS — G4733 Obstructive sleep apnea (adult) (pediatric): Secondary | ICD-10-CM | POA: Diagnosis not present

## 2019-03-09 DIAGNOSIS — Z9989 Dependence on other enabling machines and devices: Secondary | ICD-10-CM

## 2019-03-09 DIAGNOSIS — J449 Chronic obstructive pulmonary disease, unspecified: Secondary | ICD-10-CM

## 2019-03-09 LAB — POCT GLYCOSYLATED HEMOGLOBIN (HGB A1C): Hemoglobin A1C: 6 % — AB (ref 4.0–5.6)

## 2019-03-09 NOTE — Progress Notes (Signed)
Established Patient Office Visit     CC/Reason for Visit: Follow-up chronic conditions  HPI: Lindsay Brady is a 68 y.o. female who is coming in today for the above mentioned reasons. Past Medical History is significant for: Chronic respiratory failure due to oxygen dependent COPD and obstructive sleep apnea on CPAP, well-controlled type 2 diabetes with neuropathy, hyperlipidemia, anxiety with stable mood on Lexapro.  She is requesting a flu vaccine today.  She has started taking B complex for energy and wants to know how I feel about it.  She states she has been doing well, has been pretty much quarantined at home since March due to COVID-19.   Past Medical/Surgical History: Past Medical History:  Diagnosis Date  . Anxiety   . Arthritis   . Cholecystitis 07/2014  . COPD (chronic obstructive pulmonary disease) ([place])   . Diabetes mellitus without complication ([place])    type 2   . Fracture 2013 ?   left leg  . GERD (gastroesophageal reflux disease)    hx of gerd  . Headache   . Hx: UTI (urinary tract infection)   . OA (osteoarthritis) of knee   . Pneumonia    hx of viral pna years ago  . Shortness of breath dyspnea   . Sleep apnea    uses cpap  . Urgency of urination     Past Surgical History:  Procedure Laterality Date  . ABDOMINAL HYSTERECTOMY    . CARPAL TUNNEL RELEASE    . CHOLECYSTECTOMY N/A 07/19/2014   Procedure: LAPAROSCOPIC CHOLECYSTECTOMY WITH INTRAOPERATIVE CHOLANGIOGRAM;  Surgeon: Georganna Skeans, MD;  Location: [facility];  Service: General;  Laterality: N/A;  . COLONOSCOPY  ?   2011  . KNEE ARTHROSCOPY    . TONSILLECTOMY      Social History:  reports that she quit smoking about 22 years ago. Her smoking use included cigarettes. She has a 60.00 pack-year smoking history. She has never used smokeless tobacco. She reports that she does not drink alcohol or use drugs.  Allergies: Allergies  Allergen Reactions  . Erythromycin Other (See Comments)    Pain  in stomach   . Bactrim [Sulfamethoxazole-Trimethoprim] Rash  . Fosamax  [Alendronate Sodium] Cough    After taking 1 pill, pt states that she had a cough.     Family History:  Family History  Problem Relation Age of Onset  . Stroke Mother   . Lung cancer Father        smoked  . Colon cancer Father   . Cancer Paternal Grandmother   . Cancer Paternal Grandfather      Current Outpatient Medications:  .  acetaminophen (TYLENOL) 500 MG tablet, Take 500 mg by mouth every 6 (six) hours as needed., Disp: , Rfl:  .  albuterol (PROVENTIL HFA;VENTOLIN HFA) 108 (90 Base) MCG/ACT inhaler, Inhale 2 puffs into the lungs every 6 (six) hours as needed for wheezing or shortness of breath., Disp: 1 Inhaler, Rfl: 1 .  aspirin EC 81 MG tablet, Take 81 mg by mouth at bedtime., Disp: , Rfl:  .  chlorpheniramine (CHLOR-TRIMETON) 4 MG tablet, Take 4 mg by mouth at bedtime., Disp: , Rfl:  .  Cholecalciferol (VITAMIN D) 2000 UNITS CAPS, Take 2,000 Units by mouth every evening., Disp: , Rfl:  .  cholestyramine light (PREVALITE) 4 g packet, Take 4 g by mouth daily as needed (cholesterol). , Disp: , Rfl:  .  escitalopram (LEXAPRO) 10 MG tablet, TAKE 1 TABLET BY MOUTH EVERYDAY  AT BEDTIME, Disp: 90 tablet, Rfl: 1 .  famotidine (PEPCID) 20 MG tablet, Take 20 mg by mouth at bedtime as needed for heartburn or indigestion., Disp: , Rfl:  .  gabapentin (NEURONTIN) 400 MG capsule, Take 400 mg by mouth at bedtime., Disp: , Rfl:  .  ibuprofen (ADVIL,MOTRIN) 800 MG tablet, Take 1 tablet (800 mg total) by mouth every 8 (eight) hours as needed., Disp: 90 tablet, Rfl: 0 .  MYRBETRIQ 50 MG TB24 tablet, TAKE 1 TABLET BY MOUTH EVERY DAY, Disp: 90 tablet, Rfl: 1 .  omeprazole (PRILOSEC) 20 MG capsule, Take 20 mg by mouth daily as needed., Disp: , Rfl:  .  OXYGEN, 2lpm with sleep and 3lpm with exertion AHC, Disp: , Rfl:  .  simvastatin (ZOCOR) 20 MG tablet, Take 1 tablet (20 mg total) by mouth at bedtime., Disp: 90 tablet, Rfl: 3   Review of Systems:  Constitutional: Denies fever, chills, diaphoresis, appetite change and fatigue.  HEENT: Denies photophobia, eye pain, redness, hearing loss, ear pain, congestion, sore throat, rhinorrhea, sneezing, mouth sores, trouble swallowing, neck pain, neck stiffness and tinnitus.   Respiratory: Denies SOB, DOE, cough, chest tightness,  and wheezing.   Cardiovascular: Denies chest pain, palpitations and leg swelling.  Gastrointestinal: Denies nausea, vomiting, abdominal pain, diarrhea, constipation, blood in stool and abdominal distention.  Genitourinary: Denies dysuria, urgency, frequency, hematuria, flank pain and difficulty urinating.  Endocrine: Denies: hot or cold intolerance, sweats, changes in hair or nails, polyuria, polydipsia. Musculoskeletal: Denies myalgias, back pain, joint swelling, arthralgias and gait problem.  Skin: Denies pallor, rash and wound.  Neurological: Denies dizziness, seizures, syncope, weakness, light-headedness, numbness and headaches.  Hematological: Denies adenopathy. Easy bruising, personal or family bleeding history  Psychiatric/Behavioral: Denies suicidal ideation, mood changes, confusion, nervousness, sleep disturbance and agitation    Physical Exam: Vitals:   03/09/19 1303  BP: 132/66  Pulse: 70  Temp: 97.8 F (36.6 C)  TempSrc: Temporal  SpO2: 94%  Weight: 257 lb 8 oz (116.8 kg)    Body mass index is 43.52 kg/m.   Constitutional: NAD, calm, comfortable, obese Eyes: PERRL, lids and conjunctivae normal ENMT: Mucous membranes are moist.  Respiratory: clear to auscultation bilaterally, no wheezing, no crackles. Normal respiratory effort. No accessory muscle use.  Cardiovascular: Regular rate and rhythm, no murmurs / rubs / gallops. No extremity edema. 2+ pedal pulses. No carotid bruits.  Abdomen: no tenderness, no masses palpated. No hepatosplenomegaly. Bowel sounds positive.  Musculoskeletal: no clubbing / cyanosis. No joint deformity  upper and lower extremities. Good ROM, no contractures. Normal muscle tone.  Skin: no rashes, lesions, ulcers. No induration Neurologic: Grossly intact and nonfocal  Psychiatric: Normal judgment and insight. Alert and oriented x 3. Normal mood.    Impression and Plan:  Type 2 diabetes mellitus with diabetic polyneuropathy, without long-term current use of insulin (HCC) -A1c today is 6.0, well controlled.  GAD (generalized anxiety disorder) -Stable on Lexapro  OSA on CPAP -Stable on CPAP  ILD (interstitial lung disease) (HCC)  COPD GOLD 0 -Oxygen dependent, followed by pulmonary.  Morbid obesity due to excess calories (HCC) -Discussed healthy lifestyle, including increased physical activity and better food choices to promote weight loss.    Patient Instructions  -Nice seeing you today!!  -Flu vaccine today.  -Schedule follow up in 3 months.     Chaya Jan, MD [facility] at [facility]

## 2019-03-09 NOTE — Patient Instructions (Signed)
-  Nice seeing you today!!  -Flu vaccine today.  -Schedule follow up in 3 months. 

## 2019-03-09 NOTE — Addendum Note (Signed)
Addended by: Rebecca Eaton on: 03/09/2019 01:34 PM   Modules accepted: Orders

## 2019-06-04 ENCOUNTER — Other Ambulatory Visit: Payer: Self-pay | Admitting: Internal Medicine

## 2019-06-04 DIAGNOSIS — E785 Hyperlipidemia, unspecified: Secondary | ICD-10-CM

## 2019-06-04 DIAGNOSIS — E1169 Type 2 diabetes mellitus with other specified complication: Secondary | ICD-10-CM

## 2019-06-05 DIAGNOSIS — R062 Wheezing: Secondary | ICD-10-CM | POA: Diagnosis not present

## 2019-06-05 DIAGNOSIS — J449 Chronic obstructive pulmonary disease, unspecified: Secondary | ICD-10-CM | POA: Diagnosis not present

## 2019-07-06 DIAGNOSIS — R062 Wheezing: Secondary | ICD-10-CM | POA: Diagnosis not present

## 2019-07-06 DIAGNOSIS — J449 Chronic obstructive pulmonary disease, unspecified: Secondary | ICD-10-CM | POA: Diagnosis not present

## 2019-08-03 DIAGNOSIS — R062 Wheezing: Secondary | ICD-10-CM | POA: Diagnosis not present

## 2019-08-03 DIAGNOSIS — J449 Chronic obstructive pulmonary disease, unspecified: Secondary | ICD-10-CM | POA: Diagnosis not present

## 2019-08-08 ENCOUNTER — Other Ambulatory Visit: Payer: Self-pay | Admitting: Internal Medicine

## 2019-08-08 DIAGNOSIS — N959 Unspecified menopausal and perimenopausal disorder: Secondary | ICD-10-CM

## 2019-08-20 ENCOUNTER — Other Ambulatory Visit: Payer: Self-pay | Admitting: Internal Medicine

## 2019-08-20 DIAGNOSIS — F419 Anxiety disorder, unspecified: Secondary | ICD-10-CM

## 2019-08-30 ENCOUNTER — Telehealth: Payer: Self-pay

## 2019-08-30 ENCOUNTER — Ambulatory Visit: Payer: Self-pay

## 2019-08-30 ENCOUNTER — Encounter: Payer: Self-pay | Admitting: Orthopaedic Surgery

## 2019-08-30 ENCOUNTER — Other Ambulatory Visit: Payer: Self-pay

## 2019-08-30 ENCOUNTER — Telehealth: Payer: Self-pay | Admitting: Orthopaedic Surgery

## 2019-08-30 ENCOUNTER — Ambulatory Visit: Payer: Medicare Other | Admitting: Orthopaedic Surgery

## 2019-08-30 VITALS — Ht 65.0 in

## 2019-08-30 DIAGNOSIS — M25562 Pain in left knee: Secondary | ICD-10-CM | POA: Diagnosis not present

## 2019-08-30 DIAGNOSIS — M25512 Pain in left shoulder: Secondary | ICD-10-CM

## 2019-08-30 DIAGNOSIS — G8929 Other chronic pain: Secondary | ICD-10-CM

## 2019-08-30 DIAGNOSIS — M1712 Unilateral primary osteoarthritis, left knee: Secondary | ICD-10-CM

## 2019-08-30 DIAGNOSIS — M19012 Primary osteoarthritis, left shoulder: Secondary | ICD-10-CM | POA: Diagnosis not present

## 2019-08-30 MED ORDER — BUPIVACAINE HCL 0.25 % IJ SOLN
2.0000 mL | INTRAMUSCULAR | Status: AC | PRN
  Administered 2019-08-30: 2 mL via INTRA_ARTICULAR

## 2019-08-30 MED ORDER — LIDOCAINE HCL 1 % IJ SOLN
2.0000 mL | INTRAMUSCULAR | Status: AC | PRN
  Administered 2019-08-30: 2 mL

## 2019-08-30 MED ORDER — METHYLPREDNISOLONE ACETATE 40 MG/ML IJ SUSP
80.0000 mg | INTRAMUSCULAR | Status: AC | PRN
  Administered 2019-08-30: 80 mg via INTRA_ARTICULAR

## 2019-08-30 NOTE — Telephone Encounter (Signed)
Please get precert for gel injections for left knee. This is Dr.Whitfield's patient. Thanks!

## 2019-08-30 NOTE — Telephone Encounter (Signed)
Patients daughter Shanda Bumps called and stated her mom hurt her knee(daughter unsure of which knee) and is asking to be worked in today. Please advise  CB # [id number]

## 2019-08-30 NOTE — Telephone Encounter (Signed)
Please advise 

## 2019-08-30 NOTE — Telephone Encounter (Signed)
error 

## 2019-08-30 NOTE — Telephone Encounter (Signed)
Submitted for VOB for Synvisc-Left knee

## 2019-08-30 NOTE — Addendum Note (Signed)
Addended by: Wendi Maya on: 08/30/2019 04:10 PM   Modules accepted: Orders

## 2019-08-30 NOTE — Telephone Encounter (Signed)
Per Autumn H, she worked patient in with Dr. Cleophas Dunker this morning to be seen.

## 2019-08-30 NOTE — Telephone Encounter (Signed)
Scheduled pt with dr. Cleophas Dunker

## 2019-08-30 NOTE — Telephone Encounter (Signed)
Patient called. She would like something for pain. Nothing that has tylenol in it. Her call back number is [phone]. She ask that someone call her before sending RX in to the pharmacy.

## 2019-08-30 NOTE — Progress Notes (Signed)
Office Visit Note   Patient: Lindsay Brady           Date of Birth: 01-Oct-1950           MRN: [id number] Visit Date: 08/30/2019              Requested by: Philip Aspen, Limmie Patricia, MD [address] [place],  [place] [address] PCP: Philip Aspen, Limmie Patricia, MD   Assessment & Plan: Visit Diagnoses:  1. Chronic pain of left knee   2. Acute pain of left shoulder   3. Primary osteoarthritis of left knee   4. Morbid (severe) obesity due to excess calories (HCC)   5. Primary osteoarthritis, left shoulder     Plan:  #1: At this time we had a very long discussion over 40 minutes of her treatment options for her left shoulder and left knee.  This did not include time spent in history and examination.  Essentially for her left knee her BMI is over 40 and would probably not be a candidate for total knee replacement at this time until appropriate weight loss has been entertained.  She will follow back up with her medical doctor for that. #2: Part of our discussion of the left shoulder was that she would be benefited with a total shoulder replacement in the future.  Certainly she lacks the motion but also has symptoms of pain and discomfort with motion and activities of daily living.  We therefore feel that the consultation with Dr. Dorene Grebe is indicated for his review as well as getting a thin slice CT scan of the shoulder for him to evaluate for possible total shoulder replacement. #3: Today we will inject the left knee and precertified Visco supplementation.  Hopefully this will give her some benefit with the knee pain and will allow her to begin some weight reduction for the future total knee replacement.  Face-to-face time spent with patient was greater than 50 minutes.  Greater than 50% of the time was spent in counseling and coordination of care.  Follow-Up Instructions: Return in about 3 weeks (around 09/20/2019) for follow up with Dr. August Saucer after CT scan is completed..    Orders:  Orders Placed This Encounter  Procedures  . Large Joint Inj  . XR KNEE 3 VIEW LEFT  . XR Shoulder Left   No orders of the defined types were placed in this encounter.     Procedures: Large Joint Inj: L knee on 08/30/2019 11:41 AM Indications: pain and diagnostic evaluation Details: 25 G 1.5 in needle, anteromedial approach  Arthrogram: No  Medications: 2 mL lidocaine 1 %; 80 mg methylPREDNISolone acetate 40 MG/ML; 2 mL bupivacaine 0.25 % Procedure, treatment alternatives, risks and benefits explained, specific risks discussed. Consent was given by the patient. Immediately prior to procedure a time out was called to verify the correct patient, procedure, equipment, support staff and site/side marked as required. Patient was prepped and draped in the usual sterile fashion.       Clinical Data: No additional findings.   Subjective: Chief Complaint  Patient presents with  . Left Knee - Pain     Review of Systems Review of Systems  Constitutional: Negative for fatigue.  HENT: Negative for ear pain.   Eyes: Negative for pain.  Respiratory: Positive for shortness of breath.   Cardiovascular: Negative for leg swelling.  Gastrointestinal: Positive for diarrhea. Negative for constipation.  Endocrine: Negative for cold intolerance and heat intolerance.  Genitourinary: Negative for difficulty  urinating.  Musculoskeletal: Negative for joint swelling.  Skin: Negative for rash.  Allergic/Immunologic: Negative for food allergies.  Neurological: Negative for weakness.  Hematological: Does not bruise/bleed easily.  Psychiatric/Behavioral: Positive for sleep disturbance.   Objective: Vital Signs: Ht 5\' 5"  (1.651 m)   BMI 42.85 kg/m   Physical Exam Constitutional:      Appearance: She is well-developed.  Eyes:     Pupils: Pupils are equal, round, and reactive to light.  Pulmonary:     Effort: Pulmonary effort is normal.  Skin:    General: Skin is warm and dry.   Neurological:     Mental Status: She is alert and oriented to person, place, and time.  Psychiatric:        Behavior: Behavior normal.     Ortho Exam  Exam today of the left shoulder reveals very little range of motion in forward flexion, abduction or external rotation.  She has diffuse tenderness about the left shoulder girdle.  Good elbow motion.  Examination today of the left knee reveals lack of about 5 to 10 degrees of full extension and she flexes to about 100 degrees.  She does have crepitance with range of motion.  Mild effusion is noted without warmth or erythema.  Is difficult to examine the knee secondary to the size of her thigh and knee.  Specialty Comments:  No specialty comments available.  Imaging: XR KNEE 3 VIEW LEFT  Result Date: 08/30/2019 Three-view x-ray of the left knee reveals end-stage degenerative changes patellofemoral as well as medial compartment DJD.  Marked periarticular spurring in the left knee is noted.  XR Shoulder Left  Result Date: 08/30/2019 Three-view x-rays of the left shoulder reveals marked degenerative changes of the left glenohumeral joint with inferior subluxation of the humeral head and the glenoid.  Many periarticular spurring is noted.  Type II acromium.    PMFS History: Current Outpatient Medications  Medication Sig Dispense Refill  . acetaminophen (TYLENOL) 500 MG tablet Take 500 mg by mouth every 6 (six) hours as needed.    10/30/2019 albuterol (PROVENTIL HFA;VENTOLIN HFA) 108 (90 Base) MCG/ACT inhaler Inhale 2 puffs into the lungs every 6 (six) hours as needed for wheezing or shortness of breath. 1 Inhaler 1  . aspirin EC 81 MG tablet Take 81 mg by mouth at bedtime.    . chlorpheniramine (CHLOR-TRIMETON) 4 MG tablet Take 4 mg by mouth at bedtime.    . Cholecalciferol (VITAMIN D) 2000 UNITS CAPS Take 2,000 Units by mouth every evening.    . cholestyramine light (PREVALITE) 4 g packet Take 4 g by mouth daily as needed (cholesterol).     Marland Kitchen  escitalopram (LEXAPRO) 10 MG tablet TAKE 1 TABLET BY MOUTH EVERYDAY AT BEDTIME 90 tablet 0  . famotidine (PEPCID) 20 MG tablet Take 20 mg by mouth at bedtime as needed for heartburn or indigestion.    . gabapentin (NEURONTIN) 400 MG capsule Take 400 mg by mouth at bedtime.    Marland Kitchen ibuprofen (ADVIL,MOTRIN) 800 MG tablet Take 1 tablet (800 mg total) by mouth every 8 (eight) hours as needed. 90 tablet 0  . MYRBETRIQ 50 MG TB24 tablet TAKE 1 TABLET BY MOUTH EVERY DAY 90 tablet 1  . OXYGEN 2lpm with sleep and 3lpm with exertion AHC    . simvastatin (ZOCOR) 20 MG tablet TAKE 1 TABLET BY MOUTH EVERYDAY AT BEDTIME 90 tablet 3   No current facility-administered medications for this visit.    Patient Active Problem List  Diagnosis Date Noted  . Morbid (severe) obesity due to excess calories ([place]) 08/30/2019  . Primary osteoarthritis, left shoulder 08/30/2019  . DM (diabetes mellitus), type 2 ([place]) 06/22/2018  . GAD (generalized anxiety disorder) 06/22/2018  . Primary osteoarthritis of left knee 03/10/2017  . Trochanteric bursitis of right hip 03/10/2017  . Anterior dislocation of left shoulder 12/20/2016  . Closed fracture of proximal end of left humerus with routine healing 12/20/2016  . Dyspnea on exertion 07/27/2016  . RBBB 03/04/2016  . OSA on CPAP 03/04/2016  . ILD (interstitial lung disease) ([place])  c/w NSIP p macrodantin exp 02/11/2016  . Morbid obesity due to excess calories ([place]) 02/11/2016  . Chronic respiratory failure with hypoxia ([place]) 01/07/2016  . Upper airway cough syndrome 12/06/2015  . COPD GOLD 0 12/05/2015  . Acute cholecystitis 07/19/2014   Past Medical History:  Diagnosis Date  . Anxiety   . Arthritis   . Cholecystitis 07/2014  . COPD (chronic obstructive pulmonary disease) ([place])   . Diabetes mellitus without complication ([place])    type 2   . Fracture 2013 ?   left leg  . GERD (gastroesophageal reflux disease)    hx of gerd  . Headache   . Hx: UTI (urinary tract  infection)   . OA (osteoarthritis) of knee   . Pneumonia    hx of viral pna years ago  . Shortness of breath dyspnea   . Sleep apnea    uses cpap  . Urgency of urination     Family History  Problem Relation Age of Onset  . Stroke Mother   . Lung cancer Father        smoked  . Colon cancer Father   . Cancer Paternal Grandmother   . Cancer Paternal Grandfather     Past Surgical History:  Procedure Laterality Date  . ABDOMINAL HYSTERECTOMY    . CARPAL TUNNEL RELEASE    . CHOLECYSTECTOMY N/A 07/19/2014   Procedure: LAPAROSCOPIC CHOLECYSTECTOMY WITH INTRAOPERATIVE CHOLANGIOGRAM;  Surgeon: Georganna Skeans, MD;  Location: [facility];  Service: General;  Laterality: N/A;  . COLONOSCOPY  ?   2011  . KNEE ARTHROSCOPY    . TONSILLECTOMY     Social History   Occupational History  . Not on file  Tobacco Use  . Smoking status: Former Smoker    Packs/day: 2.00    Years: 30.00    Pack years: 60.00    Types: Cigarettes    Quit date: 05/31/1996    Years since quitting: 23.2  . Smokeless tobacco: Never Used  Substance and Sexual Activity  . Alcohol use: No    Alcohol/week: 0.0 standard drinks    Comment: rare  . Drug use: No  . Sexual activity: Not on file

## 2019-09-03 ENCOUNTER — Telehealth: Payer: Self-pay

## 2019-09-03 DIAGNOSIS — E559 Vitamin D deficiency, unspecified: Secondary | ICD-10-CM | POA: Diagnosis not present

## 2019-09-03 DIAGNOSIS — M25551 Pain in right hip: Secondary | ICD-10-CM | POA: Diagnosis not present

## 2019-09-03 DIAGNOSIS — M25562 Pain in left knee: Secondary | ICD-10-CM | POA: Diagnosis not present

## 2019-09-03 DIAGNOSIS — M545 Low back pain: Secondary | ICD-10-CM | POA: Diagnosis not present

## 2019-09-03 DIAGNOSIS — R062 Wheezing: Secondary | ICD-10-CM | POA: Diagnosis not present

## 2019-09-03 DIAGNOSIS — R0600 Dyspnea, unspecified: Secondary | ICD-10-CM | POA: Diagnosis not present

## 2019-09-03 DIAGNOSIS — Z20822 Contact with and (suspected) exposure to covid-19: Secondary | ICD-10-CM | POA: Diagnosis not present

## 2019-09-03 DIAGNOSIS — R5383 Other fatigue: Secondary | ICD-10-CM | POA: Diagnosis not present

## 2019-09-03 DIAGNOSIS — M25512 Pain in left shoulder: Secondary | ICD-10-CM | POA: Diagnosis not present

## 2019-09-03 DIAGNOSIS — Z79899 Other long term (current) drug therapy: Secondary | ICD-10-CM | POA: Diagnosis not present

## 2019-09-03 DIAGNOSIS — M129 Arthropathy, unspecified: Secondary | ICD-10-CM | POA: Diagnosis not present

## 2019-09-03 DIAGNOSIS — J449 Chronic obstructive pulmonary disease, unspecified: Secondary | ICD-10-CM | POA: Diagnosis not present

## 2019-09-03 DIAGNOSIS — E7800 Pure hypercholesterolemia, unspecified: Secondary | ICD-10-CM | POA: Diagnosis not present

## 2019-09-03 NOTE — Telephone Encounter (Signed)
Called and informed pt of approval and copay and pt stated understanding. Appointments were scheduled   Patient also asked if I had any update on the pain rx she asked for on Friday. I informed her it was still pending approval. She asked that I send a message to Dr. Hoy Register team to check on this.

## 2019-09-03 NOTE — Telephone Encounter (Signed)
Please advise. Thanks.  

## 2019-09-03 NOTE — Telephone Encounter (Signed)
Remind me to call her Tuesday

## 2019-09-03 NOTE — Telephone Encounter (Signed)
Approved for Synvisc-Left knee Dr. Estill Bakes and Bill $35 copay 20% OOP No prior auth required

## 2019-09-04 ENCOUNTER — Other Ambulatory Visit: Payer: Self-pay | Admitting: Orthopedic Surgery

## 2019-09-04 NOTE — Telephone Encounter (Signed)
Spoke to her.  Doesn't want tylenol type pain meds and Tramadol didn't help.  We discussed not using [facility] except for severe pain.  She will see if there is a drug she would like to use and call us back

## 2019-09-04 NOTE — Telephone Encounter (Signed)
A reminder.Marland KitchenMarland Kitchen

## 2019-09-04 NOTE — Telephone Encounter (Signed)
Please see below.

## 2019-09-04 NOTE — Telephone Encounter (Signed)
Refer to Lindsay Brady as he saw her last-remind me to ca;ll [name]

## 2019-09-10 ENCOUNTER — Ambulatory Visit: Payer: Medicare Other | Admitting: Orthopedic Surgery

## 2019-09-11 ENCOUNTER — Other Ambulatory Visit: Payer: Self-pay

## 2019-09-11 ENCOUNTER — Ambulatory Visit: Payer: Medicare Other | Admitting: Orthopaedic Surgery

## 2019-09-11 ENCOUNTER — Encounter: Payer: Self-pay | Admitting: Orthopaedic Surgery

## 2019-09-11 VITALS — Ht 65.0 in | Wt 257.0 lb

## 2019-09-11 DIAGNOSIS — M19012 Primary osteoarthritis, left shoulder: Secondary | ICD-10-CM

## 2019-09-11 DIAGNOSIS — M1712 Unilateral primary osteoarthritis, left knee: Secondary | ICD-10-CM

## 2019-09-11 MED ORDER — LIDOCAINE HCL 1 % IJ SOLN
2.0000 mL | INTRAMUSCULAR | Status: AC | PRN
  Administered 2019-09-11: 2 mL

## 2019-09-11 NOTE — Progress Notes (Signed)
Office Visit Note   Patient: Lindsay Brady           Date of Birth: May 17, 1951           MRN: [id number] Visit Date: 09/11/2019              Requested by: Isaac Bliss, Rayford Halsted, MD [place],  [place] [address] PCP: Isaac Bliss, Rayford Halsted, MD   Assessment & Plan: Visit Diagnoses:  1. Primary osteoarthritis of left knee   2. Primary osteoarthritis, left shoulder     Plan:  #1: First Synvisc injection was given to the left knee without difficulty.  Tolerated procedure well. #2: We also had a long discussion of at least 20 minutes in regards to total shoulder replacement on the left for her in the near future.  As well as discussion about viscosupplementation.  I tried to answer all her questions and she was happy with the discussion.  Follow-Up Instructions: Return in about 1 week (around 09/18/2019).   Orders:  No orders of the defined types were placed in this encounter.  No orders of the defined types were placed in this encounter.     Procedures: Large Joint Inj: L knee on 09/11/2019 4:55 PM Indications: pain and joint swelling Details: 25 G 1.5 in needle, anteromedial approach  Arthrogram: No  Medications: 2 mL lidocaine 1 % Outcome: tolerated well, no immediate complications Procedure, treatment alternatives, risks and benefits explained, specific risks discussed. Consent was given by the patient. Immediately prior to procedure a time out was called to verify the correct patient, procedure, equipment, support staff and site/side marked as required. Patient was prepped and draped in the usual sterile fashion.       Clinical Data: No additional findings.   Subjective: Chief Complaint  Patient presents with  . Left Knee - Follow-up    Starting Synvisc today   HPI Patient presents today for the first Synvisc injection in her left knee.  She does also have arthritis in the left shoulder and states that she will probably in the  future need to have a shoulder replacement but was quite scared about the procedure and has been hesitant to further discuss this.     Review of Systems  Constitutional: Negative for fatigue.  HENT: Negative for ear pain.   Eyes: Negative for pain.  Respiratory: Positive for shortness of breath.   Cardiovascular: Negative for leg swelling.  Gastrointestinal: Negative for constipation and diarrhea.  Endocrine: Negative for cold intolerance and heat intolerance.  Genitourinary: Negative for difficulty urinating.  Musculoskeletal: Positive for joint swelling.  Skin: Negative for rash.  Allergic/Immunologic: Negative for food allergies.  Neurological: Negative for weakness.  Hematological: Does not bruise/bleed easily.  Psychiatric/Behavioral: Positive for sleep disturbance.     Objective: Vital Signs: Ht 5\' 5"  (1.651 m)   Wt 257 lb (116.6 kg)   BMI 42.77 kg/m   Physical Exam Constitutional:      Appearance: She is well-developed. She is obese.  HENT:     Head: Normocephalic and atraumatic.  Eyes:     Pupils: Pupils are equal, round, and reactive to light.  Pulmonary:     Effort: Pulmonary effort is normal.  Skin:    General: Skin is warm and dry.  Neurological:     Mental Status: She is alert and oriented to person, place, and time.  Psychiatric:        Behavior: Behavior normal.     Ortho Exam  Exam today reveals the need to be not warm or erythema.  She does have a trace effusion.  Ligamentously stable.  Crepitus with flexion and extension.  Specialty Comments:  No specialty comments available.  Imaging: No results found.   PMFS History: Current Outpatient Medications  Medication Sig Dispense Refill  . acetaminophen (TYLENOL) 500 MG tablet Take 500 mg by mouth every 6 (six) hours as needed.    Marland Kitchen albuterol (PROVENTIL HFA;VENTOLIN HFA) 108 (90 Base) MCG/ACT inhaler Inhale 2 puffs into the lungs every 6 (six) hours as needed for wheezing or shortness of breath.  1 Inhaler 1  . aspirin EC 81 MG tablet Take 81 mg by mouth at bedtime.    . chlorpheniramine (CHLOR-TRIMETON) 4 MG tablet Take 4 mg by mouth at bedtime.    . Cholecalciferol (VITAMIN D) 2000 UNITS CAPS Take 2,000 Units by mouth every evening.    . cholestyramine light (PREVALITE) 4 g packet Take 4 g by mouth daily as needed (cholesterol).     Marland Kitchen escitalopram (LEXAPRO) 10 MG tablet TAKE 1 TABLET BY MOUTH EVERYDAY AT BEDTIME 90 tablet 0  . famotidine (PEPCID) 20 MG tablet Take 20 mg by mouth at bedtime as needed for heartburn or indigestion.    . gabapentin (NEURONTIN) 400 MG capsule Take 400 mg by mouth at bedtime.    Marland Kitchen ibuprofen (ADVIL,MOTRIN) 800 MG tablet Take 1 tablet (800 mg total) by mouth every 8 (eight) hours as needed. 90 tablet 0  . MYRBETRIQ 50 MG TB24 tablet TAKE 1 TABLET BY MOUTH EVERY DAY 90 tablet 1  . OXYGEN 2lpm with sleep and 3lpm with exertion AHC    . simvastatin (ZOCOR) 20 MG tablet TAKE 1 TABLET BY MOUTH EVERYDAY AT BEDTIME 90 tablet 3   No current facility-administered medications for this visit.    Patient Active Problem List   Diagnosis Date Noted  . Morbid (severe) obesity due to excess calories (HCC) 08/30/2019  . Primary osteoarthritis, left shoulder 08/30/2019  . DM (diabetes mellitus), type 2 (HCC) 06/22/2018  . GAD (generalized anxiety disorder) 06/22/2018  . Primary osteoarthritis of left knee 03/10/2017  . Trochanteric bursitis of right hip 03/10/2017  . Anterior dislocation of left shoulder 12/20/2016  . Closed fracture of proximal end of left humerus with routine healing 12/20/2016  . Dyspnea on exertion 07/27/2016  . RBBB 03/04/2016  . OSA on CPAP 03/04/2016  . ILD (interstitial lung disease) (HCC)  c/w NSIP p macrodantin exp 02/11/2016  . Morbid obesity due to excess calories (HCC) 02/11/2016  . Chronic respiratory failure with hypoxia (HCC) 01/07/2016  . Upper airway cough syndrome 12/06/2015  . COPD GOLD 0 12/05/2015  . Acute cholecystitis  07/19/2014   Past Medical History:  Diagnosis Date  . Anxiety   . Arthritis   . Cholecystitis 07/2014  . COPD (chronic obstructive pulmonary disease) (HCC)   . Diabetes mellitus without complication (HCC)    type 2   . Fracture 2013 ?   left leg  . GERD (gastroesophageal reflux disease)    hx of gerd  . Headache   . Hx: UTI (urinary tract infection)   . OA (osteoarthritis) of knee   . Pneumonia    hx of viral pna years ago  . Shortness of breath dyspnea   . Sleep apnea    uses cpap  . Urgency of urination     Family History  Problem Relation Age of Onset  . Stroke Mother   . Lung cancer Father  smoked  . Colon cancer Father   . Cancer Paternal Grandmother   . Cancer Paternal Grandfather     Past Surgical History:  Procedure Laterality Date  . ABDOMINAL HYSTERECTOMY    . CARPAL TUNNEL RELEASE    . CHOLECYSTECTOMY N/A 07/19/2014   Procedure: LAPAROSCOPIC CHOLECYSTECTOMY WITH INTRAOPERATIVE CHOLANGIOGRAM;  Surgeon: Violeta Gelinas, MD;  Location: [facility];  Service: General;  Laterality: N/A;  . COLONOSCOPY  ?   2011  . KNEE ARTHROSCOPY    . TONSILLECTOMY     Social History   Occupational History  . Not on file  Tobacco Use  . Smoking status: Former Smoker    Packs/day: 2.00    Years: 30.00    Pack years: 60.00    Types: Cigarettes    Quit date: 05/31/1996    Years since quitting: 23.2  . Smokeless tobacco: Never Used  Substance and Sexual Activity  . Alcohol use: No    Alcohol/week: 0.0 standard drinks    Comment: rare  . Drug use: No  . Sexual activity: Not on file

## 2019-09-12 DIAGNOSIS — M545 Low back pain: Secondary | ICD-10-CM | POA: Diagnosis not present

## 2019-09-12 DIAGNOSIS — Z79899 Other long term (current) drug therapy: Secondary | ICD-10-CM | POA: Diagnosis not present

## 2019-09-12 DIAGNOSIS — M25512 Pain in left shoulder: Secondary | ICD-10-CM | POA: Diagnosis not present

## 2019-09-12 DIAGNOSIS — Z1159 Encounter for screening for other viral diseases: Secondary | ICD-10-CM | POA: Diagnosis not present

## 2019-09-12 DIAGNOSIS — R0600 Dyspnea, unspecified: Secondary | ICD-10-CM | POA: Diagnosis not present

## 2019-09-12 DIAGNOSIS — M25562 Pain in left knee: Secondary | ICD-10-CM | POA: Diagnosis not present

## 2019-09-13 ENCOUNTER — Other Ambulatory Visit: Payer: Self-pay

## 2019-09-13 ENCOUNTER — Ambulatory Visit
Admission: RE | Admit: 2019-09-13 | Discharge: 2019-09-13 | Disposition: A | Discharge status: Home / Self Care | Payer: Medicare Other | Source: Ambulatory Visit | Attending: Orthopaedic Surgery | Admitting: Orthopaedic Surgery

## 2019-09-13 DIAGNOSIS — M19012 Primary osteoarthritis, left shoulder: Secondary | ICD-10-CM

## 2019-09-17 ENCOUNTER — Ambulatory Visit (INDEPENDENT_AMBULATORY_CARE_PROVIDER_SITE_OTHER): Payer: Medicare Other | Admitting: Orthopedic Surgery

## 2019-09-17 ENCOUNTER — Encounter: Payer: Self-pay | Admitting: Orthopedic Surgery

## 2019-09-17 ENCOUNTER — Other Ambulatory Visit: Payer: Self-pay

## 2019-09-17 DIAGNOSIS — M25512 Pain in left shoulder: Secondary | ICD-10-CM

## 2019-09-17 NOTE — Progress Notes (Signed)
Office Visit Note   Patient: Lindsay Brady           Date of Birth: 01/16/1951           MRN: [id number] Visit Date: 09/17/2019 Requested by: Philip Aspen, Limmie Patricia, MD [address] [place],  [place] [address] PCP: Philip Aspen, Limmie Patricia, MD  Subjective: Chief Complaint  Patient presents with  . Follow-up    HPI: Lindsay Brady is a patient with left shoulder pain.  She had a dislocation 2 years ago.  It was difficult to reduce.  She is retired.  She has been getting gel shots in her knee.  She states that her shoulder stiffness is worsening gradually and the pain is also worsening gradually but she can manage with what she has.  She is apprehensive about shoulder replacement.  Does wake her from sleep at night at times.  She does have difficulty putting her hand over her head.              ROS: All systems reviewed are negative as they relate to the chief complaint within the history of present illness.  Patient denies  fevers or chills.   Assessment & Plan: Visit Diagnoses:  1. Left shoulder pain, unspecified chronicity     Plan: Impression is left shoulder arthritis following dislocation.  Thin cut CT scan confirms some collapse of the humeral head as well as osteophyte formation blocking motion around the proximal humerus.  The glenoid appears to be intact.  Some of the collapse in the humeral head may be related to a Hill-Sachs lesion.  Nonetheless she has end-stage shoulder arthritis at this time with severely restricted range of motion.  Based on the odds her best bet for shoulder replacement would be reverse shoulder replacement.  The risk and benefits are discussed.  Expected rehabilitation discussed.  She is going to consider her options but for now we will set her up for fluoroscopic shoulder injection.  Follow-up with me as needed.  I do not think gel injections in the shoulder will give her predictable pain relief.  Follow-Up Instructions: No follow-ups on file.     Orders:  Orders Placed This Encounter  Procedures  . Ambulatory referral to Physical Medicine Rehab   No orders of the defined types were placed in this encounter.     Procedures: No procedures performed   Clinical Data: No additional findings.  Objective: Vital Signs: There were no vitals taken for this visit.  Physical Exam:   Constitutional: Patient appears well-developed HEENT:  Head: Normocephalic Eyes:EOM are normal Neck: Normal range of motion Cardiovascular: Normal rate Pulmonary/chest: Effort normal Neurologic: Patient is alert Skin: Skin is warm Psychiatric: Patient has normal mood and affect    Ortho Exam: Ortho exam demonstrates 10 degrees of external rotation on the left compared to 65 on the right.  Rotator cuff strength is good to infraspinatus and subscap testing on the left with supraspinatus testing is weak.  Isolated glenohumeral forward flexion is about 75.  Isolated glenohumeral abduction 75.  Deltoid is functional.  Specialty Comments:  No specialty comments available.  Imaging: No results found.   PMFS History: Patient Active Problem List   Diagnosis Date Noted  . Morbid (severe) obesity due to excess calories (HCC) 08/30/2019  . Primary osteoarthritis, left shoulder 08/30/2019  . DM (diabetes mellitus), type 2 (HCC) 06/22/2018  . GAD (generalized anxiety disorder) 06/22/2018  . Primary osteoarthritis of left knee 03/10/2017  . Trochanteric bursitis of  right hip 03/10/2017  . Anterior dislocation of left shoulder 12/20/2016  . Closed fracture of proximal end of left humerus with routine healing 12/20/2016  . Dyspnea on exertion 07/27/2016  . RBBB 03/04/2016  . OSA on CPAP 03/04/2016  . ILD (interstitial lung disease) ([place])  c/w NSIP p macrodantin exp 02/11/2016  . Morbid obesity due to excess calories ([place]) 02/11/2016  . Chronic respiratory failure with hypoxia ([place]) 01/07/2016  . Upper airway cough syndrome 12/06/2015  . COPD  GOLD 0 12/05/2015  . Acute cholecystitis 07/19/2014   Past Medical History:  Diagnosis Date  . Anxiety   . Arthritis   . Cholecystitis 07/2014  . COPD (chronic obstructive pulmonary disease) ([place])   . Diabetes mellitus without complication ([place])    type 2   . Fracture 2013 ?   left leg  . GERD (gastroesophageal reflux disease)    hx of gerd  . Headache   . Hx: UTI (urinary tract infection)   . OA (osteoarthritis) of knee   . Pneumonia    hx of viral pna years ago  . Shortness of breath dyspnea   . Sleep apnea    uses cpap  . Urgency of urination     Family History  Problem Relation Age of Onset  . Stroke Mother   . Lung cancer Father        smoked  . Colon cancer Father   . Cancer Paternal Grandmother   . Cancer Paternal Grandfather     Past Surgical History:  Procedure Laterality Date  . ABDOMINAL HYSTERECTOMY    . CARPAL TUNNEL RELEASE    . CHOLECYSTECTOMY N/A 07/19/2014   Procedure: LAPAROSCOPIC CHOLECYSTECTOMY WITH INTRAOPERATIVE CHOLANGIOGRAM;  Surgeon: Georganna Skeans, MD;  Location: [facility];  Service: General;  Laterality: N/A;  . COLONOSCOPY  ?   2011  . KNEE ARTHROSCOPY    . TONSILLECTOMY     Social History   Occupational History  . Not on file  Tobacco Use  . Smoking status: Former Smoker    Packs/day: 2.00    Years: 30.00    Pack years: 60.00    Types: Cigarettes    Quit date: 05/31/1996    Years since quitting: 23.3  . Smokeless tobacco: Never Used  Substance and Sexual Activity  . Alcohol use: No    Alcohol/week: 0.0 standard drinks    Comment: rare  . Drug use: No  . Sexual activity: Not on file

## 2019-09-18 ENCOUNTER — Ambulatory Visit: Payer: Medicare Other | Admitting: Orthopaedic Surgery

## 2019-09-18 ENCOUNTER — Encounter: Payer: Self-pay | Admitting: Orthopaedic Surgery

## 2019-09-18 DIAGNOSIS — M1712 Unilateral primary osteoarthritis, left knee: Secondary | ICD-10-CM | POA: Diagnosis not present

## 2019-09-18 MED ORDER — HYLAN G-F 20 16 MG/2ML IX SOSY
16.0000 mg | PREFILLED_SYRINGE | INTRA_ARTICULAR | Status: AC | PRN
  Administered 2019-09-18: 16 mg via INTRA_ARTICULAR

## 2019-09-18 NOTE — Progress Notes (Signed)
Office Visit Note   Patient: Lindsay Brady           Date of Birth: 02-05-1951           MRN: [id number] Visit Date: 09/18/2019              Requested by: Isaac Bliss, Rayford Halsted, MD [place],  [place] [address] PCP: Isaac Bliss, Rayford Halsted, MD   Assessment & Plan: Visit Diagnoses:  1. Primary osteoarthritis of left knee     Plan: Second Synvisc injection left knee.  No problems referable to the first injection  Follow-Up Instructions: Return in about 1 week (around 09/25/2019).   Orders:  Orders Placed This Encounter  Procedures  . Large Joint Inj: L knee   No orders of the defined types were placed in this encounter.     Procedures: Large Joint Inj: L knee on 09/18/2019 4:13 PM Indications: pain and joint swelling Details: 25 G 1.5 in needle, anteromedial approach  Arthrogram: No  Medications: 16 mg Hylan 16 MG/2ML Outcome: tolerated well, no immediate complications Procedure, treatment alternatives, risks and benefits explained, specific risks discussed. Consent was given by the patient. Immediately prior to procedure a time out was called to verify the correct patient, procedure, equipment, support staff and site/side marked as required. Patient was prepped and draped in the usual sterile fashion.       Clinical Data: No additional findings.   Subjective: Chief Complaint  Patient presents with  . Left Knee - Follow-up    Synvisc series started 09/11/2019  Patient presents today for her second injection of the Synvisc series. She started the series on 09/11/2019.  HPI  Review of Systems   Objective: Vital Signs: There were no vitals taken for this visit.  Physical Exam  Ortho Exam left knee was not hot red warm or swollen.  Walks without a limp.  No use of ambulatory aid.  Specialty Comments:  No specialty comments available.  Imaging: No results found.   PMFS History: Patient Active Problem List   Diagnosis  Date Noted  . Morbid (severe) obesity due to excess calories ([place]) 08/30/2019  . Primary osteoarthritis, left shoulder 08/30/2019  . DM (diabetes mellitus), type 2 ([place]) 06/22/2018  . GAD (generalized anxiety disorder) 06/22/2018  . Primary osteoarthritis of left knee 03/10/2017  . Trochanteric bursitis of right hip 03/10/2017  . Anterior dislocation of left shoulder 12/20/2016  . Closed fracture of proximal end of left humerus with routine healing 12/20/2016  . Dyspnea on exertion 07/27/2016  . RBBB 03/04/2016  . OSA on CPAP 03/04/2016  . ILD (interstitial lung disease) ([place])  c/w NSIP p macrodantin exp 02/11/2016  . Morbid obesity due to excess calories ([place]) 02/11/2016  . Chronic respiratory failure with hypoxia ([place]) 01/07/2016  . Upper airway cough syndrome 12/06/2015  . COPD GOLD 0 12/05/2015  . Acute cholecystitis 07/19/2014   Past Medical History:  Diagnosis Date  . Anxiety   . Arthritis   . Cholecystitis 07/2014  . COPD (chronic obstructive pulmonary disease) ([place])   . Diabetes mellitus without complication ([place])    type 2   . Fracture 2013 ?   left leg  . GERD (gastroesophageal reflux disease)    hx of gerd  . Headache   . Hx: UTI (urinary tract infection)   . OA (osteoarthritis) of knee   . Pneumonia    hx of viral pna years ago  . Shortness of breath dyspnea   .  Sleep apnea    uses cpap  . Urgency of urination     Family History  Problem Relation Age of Onset  . Stroke Mother   . Lung cancer Father        smoked  . Colon cancer Father   . Cancer Paternal Grandmother   . Cancer Paternal Grandfather     Past Surgical History:  Procedure Laterality Date  . ABDOMINAL HYSTERECTOMY    . CARPAL TUNNEL RELEASE    . CHOLECYSTECTOMY N/A 07/19/2014   Procedure: LAPAROSCOPIC CHOLECYSTECTOMY WITH INTRAOPERATIVE CHOLANGIOGRAM;  Surgeon: Violeta Gelinas, MD;  Location: [facility] OR;  Service: General;  Laterality: N/A;  . COLONOSCOPY  ?   2011  . KNEE ARTHROSCOPY    .  TONSILLECTOMY     Social History   Occupational History  . Not on file  Tobacco Use  . Smoking status: Former Smoker    Packs/day: 2.00    Years: 30.00    Pack years: 60.00    Types: Cigarettes    Quit date: 05/31/1996    Years since quitting: 23.3  . Smokeless tobacco: Never Used  Substance and Sexual Activity  . Alcohol use: No    Alcohol/week: 0.0 standard drinks    Comment: rare  . Drug use: No  . Sexual activity: Not on file

## 2019-09-21 DIAGNOSIS — G4733 Obstructive sleep apnea (adult) (pediatric): Secondary | ICD-10-CM | POA: Diagnosis not present

## 2019-09-24 DIAGNOSIS — M545 Low back pain: Secondary | ICD-10-CM | POA: Diagnosis not present

## 2019-09-24 DIAGNOSIS — M25562 Pain in left knee: Secondary | ICD-10-CM | POA: Diagnosis not present

## 2019-09-25 ENCOUNTER — Ambulatory Visit: Payer: Medicare Other | Admitting: Orthopaedic Surgery

## 2019-09-25 ENCOUNTER — Other Ambulatory Visit: Payer: Self-pay

## 2019-09-25 ENCOUNTER — Encounter: Payer: Self-pay | Admitting: Orthopaedic Surgery

## 2019-09-25 VITALS — Ht 65.0 in | Wt 257.0 lb

## 2019-09-25 DIAGNOSIS — M1712 Unilateral primary osteoarthritis, left knee: Secondary | ICD-10-CM

## 2019-09-25 MED ORDER — HYLAN G-F 20 16 MG/2ML IX SOSY
16.0000 mg | PREFILLED_SYRINGE | INTRA_ARTICULAR | Status: AC | PRN
  Administered 2019-09-25: 16 mg via INTRA_ARTICULAR

## 2019-09-25 MED ORDER — LIDOCAINE HCL 1 % IJ SOLN
2.0000 mL | INTRAMUSCULAR | Status: AC | PRN
  Administered 2019-09-25: 2 mL

## 2019-09-25 NOTE — Progress Notes (Signed)
Office Visit Note   Patient: Lindsay Brady           Date of Birth: [birth year]           MRN: [id number] Visit Date: 09/25/2019              Requested by: Philip Aspen, Limmie Patricia, MD [address] [place],  [place] [address] PCP: Philip Aspen, Limmie Patricia, MD   Assessment & Plan: Visit Diagnoses:  1. Primary osteoarthritis of left knee     Plan:  #1: The third Synvisc injection was given without difficulty.  Tolerated the procedure well  Follow-Up Instructions: Return if symptoms worsen or fail to improve.   Orders:  No orders of the defined types were placed in this encounter.  No orders of the defined types were placed in this encounter.     Procedures: Large Joint Inj: L knee on 09/25/2019 5:13 PM Indications: pain and joint swelling Details: 25 G 1.5 in needle, anteromedial approach  Arthrogram: No  Medications: 2 mL lidocaine 1 %; 16 mg Hylan 16 MG/2ML Outcome: tolerated well, no immediate complications Procedure, treatment alternatives, risks and benefits explained, specific risks discussed. Consent was given by the patient. Immediately prior to procedure a time out was called to verify the correct patient, procedure, equipment, support staff and site/side marked as required. Patient was prepped and draped in the usual sterile fashion.       Clinical Data: No additional findings.   Subjective: Chief Complaint  Patient presents with  . Left Knee - Follow-up    Synvisc started 09/11/2019  Patient presents today for the third Synvisc injection. She started the series on 09/11/2019. Patient states that she is doing much better.   HPI  Review of Systems   Objective: Vital Signs: Ht 5\' 5"  (1.651 m)   Wt 257 lb (116.6 kg)   BMI 42.77 kg/m   Physical Exam  Ortho Exam  Knee exam benign with mild effusion without warmth or erythema  Specialty Comments:  No specialty comments available.  Imaging: No results found.   PMFS  History: Current Outpatient Medications  Medication Sig Dispense Refill  . acetaminophen (TYLENOL) 500 MG tablet Take 500 mg by mouth every 6 (six) hours as needed.    albuterol (PROVENTIL HFA;VENTOLIN HFA) 108 (90 Base) MCG/ACT inhaler Inhale 2 puffs into the lungs every 6 (six) hours as needed for wheezing or shortness of breath. 1 Inhaler 1  . aspirin EC 81 MG tablet Take 81 mg by mouth at bedtime.    . chlorpheniramine (CHLOR-TRIMETON) 4 MG tablet Take 4 mg by mouth at bedtime.    . Cholecalciferol (VITAMIN D) 2000 UNITS CAPS Take 2,000 Units by mouth every evening.    . cholestyramine light (PREVALITE) 4 g packet Take 4 g by mouth daily as needed (cholesterol).     Marland Kitchen escitalopram (LEXAPRO) 10 MG tablet TAKE 1 TABLET BY MOUTH EVERYDAY AT BEDTIME 90 tablet 0  . famotidine (PEPCID) 20 MG tablet Take 20 mg by mouth at bedtime as needed for heartburn or indigestion.    . gabapentin (NEURONTIN) 400 MG capsule Take 400 mg by mouth at bedtime.    Marland Kitchen ibuprofen (ADVIL,MOTRIN) 800 MG tablet Take 1 tablet (800 mg total) by mouth every 8 (eight) hours as needed. 90 tablet 0  . MYRBETRIQ 50 MG TB24 tablet TAKE 1 TABLET BY MOUTH EVERY DAY 90 tablet 1  . OXYGEN 2lpm with sleep and 3lpm with exertion [facility]    .  simvastatin (ZOCOR) 20 MG tablet TAKE 1 TABLET BY MOUTH EVERYDAY AT BEDTIME 90 tablet 3   No current facility-administered medications for this visit.    Patient Active Problem List   Diagnosis Date Noted  . Morbid (severe) obesity due to excess calories ([place]) 08/30/2019  . Primary osteoarthritis, left shoulder 08/30/2019  . DM (diabetes mellitus), type 2 ([place]) 06/22/2018  . GAD (generalized anxiety disorder) 06/22/2018  . Primary osteoarthritis of left knee 03/10/2017  . Trochanteric bursitis of right hip 03/10/2017  . Anterior dislocation of left shoulder 12/20/2016  . Closed fracture of proximal end of left humerus with routine healing 12/20/2016  . Dyspnea on exertion 07/27/2016  . RBBB  03/04/2016  . OSA on CPAP 03/04/2016  . ILD (interstitial lung disease) ([place])  c/w NSIP p macrodantin exp 02/11/2016  . Morbid obesity due to excess calories ([place]) 02/11/2016  . Chronic respiratory failure with hypoxia ([place]) 01/07/2016  . Upper airway cough syndrome 12/06/2015  . COPD GOLD 0 12/05/2015  . Acute cholecystitis 07/19/2014   Past Medical History:  Diagnosis Date  . Anxiety   . Arthritis   . Cholecystitis 07/2014  . COPD (chronic obstructive pulmonary disease) ([place])   . Diabetes mellitus without complication ([place])    type 2   . Fracture 2013 ?   left leg  . GERD (gastroesophageal reflux disease)    hx of gerd  . Headache   . Hx: UTI (urinary tract infection)   . OA (osteoarthritis) of knee   . Pneumonia    hx of viral pna years ago  . Shortness of breath dyspnea   . Sleep apnea    uses cpap  . Urgency of urination     Family History  Problem Relation Age of Onset  . Stroke Mother   . Lung cancer Father        smoked  . Colon cancer Father   . Cancer Paternal Grandmother   . Cancer Paternal Grandfather     Past Surgical History:  Procedure Laterality Date  . ABDOMINAL HYSTERECTOMY    . CARPAL TUNNEL RELEASE    . CHOLECYSTECTOMY N/A 07/19/2014   Procedure: LAPAROSCOPIC CHOLECYSTECTOMY WITH INTRAOPERATIVE CHOLANGIOGRAM;  Surgeon: Georganna Skeans, MD;  Location: [facility];  Service: General;  Laterality: N/A;  . COLONOSCOPY  ?   2011  . KNEE ARTHROSCOPY    . TONSILLECTOMY     Social History   Occupational History  . Not on file  Tobacco Use  . Smoking status: Former Smoker    Packs/day: 2.00    Years: 30.00    Pack years: 60.00    Types: Cigarettes    Quit date: 05/31/1996    Years since quitting: 23.3  . Smokeless tobacco: Never Used  Substance and Sexual Activity  . Alcohol use: No    Alcohol/week: 0.0 standard drinks    Comment: rare  . Drug use: No  . Sexual activity: Not on file

## 2019-09-26 ENCOUNTER — Emergency Department (HOSPITAL_COMMUNITY): Payer: Medicare Other

## 2019-09-26 ENCOUNTER — Emergency Department (HOSPITAL_COMMUNITY)
Admission: EM | Admit: 2019-09-26 | Discharge: 2019-09-26 | Disposition: A | Discharge status: Home / Self Care | Payer: Medicare Other | Attending: Emergency Medicine | Admitting: Emergency Medicine

## 2019-09-26 ENCOUNTER — Other Ambulatory Visit: Payer: Self-pay

## 2019-09-26 DIAGNOSIS — Z79899 Other long term (current) drug therapy: Secondary | ICD-10-CM | POA: Diagnosis not present

## 2019-09-26 DIAGNOSIS — Z743 Need for continuous supervision: Secondary | ICD-10-CM | POA: Diagnosis not present

## 2019-09-26 DIAGNOSIS — Z7982 Long term (current) use of aspirin: Secondary | ICD-10-CM | POA: Insufficient documentation

## 2019-09-26 DIAGNOSIS — I959 Hypotension, unspecified: Secondary | ICD-10-CM | POA: Diagnosis not present

## 2019-09-26 DIAGNOSIS — Z03818 Encounter for observation for suspected exposure to other biological agents ruled out: Secondary | ICD-10-CM | POA: Diagnosis not present

## 2019-09-26 DIAGNOSIS — Y939 Activity, unspecified: Secondary | ICD-10-CM | POA: Insufficient documentation

## 2019-09-26 DIAGNOSIS — Z87891 Personal history of nicotine dependence: Secondary | ICD-10-CM | POA: Insufficient documentation

## 2019-09-26 DIAGNOSIS — Y929 Unspecified place or not applicable: Secondary | ICD-10-CM | POA: Insufficient documentation

## 2019-09-26 DIAGNOSIS — Y999 Unspecified external cause status: Secondary | ICD-10-CM | POA: Insufficient documentation

## 2019-09-26 DIAGNOSIS — W19XXXA Unspecified fall, initial encounter: Secondary | ICD-10-CM | POA: Diagnosis not present

## 2019-09-26 DIAGNOSIS — E119 Type 2 diabetes mellitus without complications: Secondary | ICD-10-CM | POA: Diagnosis not present

## 2019-09-26 DIAGNOSIS — M25562 Pain in left knee: Secondary | ICD-10-CM | POA: Diagnosis not present

## 2019-09-26 DIAGNOSIS — M25512 Pain in left shoulder: Secondary | ICD-10-CM | POA: Diagnosis not present

## 2019-09-26 DIAGNOSIS — M545 Low back pain: Secondary | ICD-10-CM | POA: Diagnosis not present

## 2019-09-26 DIAGNOSIS — S4991XA Unspecified injury of right shoulder and upper arm, initial encounter: Secondary | ICD-10-CM | POA: Diagnosis present

## 2019-09-26 DIAGNOSIS — S42201A Unspecified fracture of upper end of right humerus, initial encounter for closed fracture: Secondary | ICD-10-CM | POA: Diagnosis not present

## 2019-09-26 DIAGNOSIS — J449 Chronic obstructive pulmonary disease, unspecified: Secondary | ICD-10-CM | POA: Insufficient documentation

## 2019-09-26 DIAGNOSIS — G8929 Other chronic pain: Secondary | ICD-10-CM | POA: Diagnosis not present

## 2019-09-26 DIAGNOSIS — M25511 Pain in right shoulder: Secondary | ICD-10-CM | POA: Diagnosis not present

## 2019-09-26 DIAGNOSIS — R52 Pain, unspecified: Secondary | ICD-10-CM | POA: Diagnosis not present

## 2019-09-26 MED ORDER — OXYCODONE-ACETAMINOPHEN 5-325 MG PO TABS: 1.0000 | ORAL_TABLET | ORAL | 0 refills | Status: DC | PRN

## 2019-09-26 MED ORDER — OXYCODONE-ACETAMINOPHEN 5-325 MG PO TABS
2.0000 | ORAL_TABLET | Freq: Once | ORAL | Status: AC
  Administered 2019-09-26: 19:00:00 2 via ORAL
  Filled 2019-09-26: qty 2

## 2019-09-26 NOTE — ED Provider Notes (Signed)
[facility]-EMERGENCY DEPT Provider Note   CSN: [id number] Arrival date & time: 09/26/19  1702     History Chief Complaint  Patient presents with  . Shoulder Injury    right    Lindsay Brady is a 69 y.o. female.  69 year old female who presents with right shoulder injury after mechanical fall.  Patient denies any LOC.  No head or neck pain.  Does have sharp pain to right shoulder that is worse with any movement.  Denies any distal weakness to her right hand.  Denies any pain to her right wrist or elbow or forearm.  No pain to her hips or lower back.  EMS called and patient given fentanyl and transported here        Past Medical History:  Diagnosis Date  . Anxiety   . Arthritis   . Cholecystitis 07/2014  . COPD (chronic obstructive pulmonary disease) ([facility])   . Diabetes mellitus without complication ([facility])    type 2   . Fracture 2013 ?   left leg  . GERD (gastroesophageal reflux disease)    hx of gerd  . Headache   . Hx: UTI (urinary tract infection)   . OA (osteoarthritis) of knee   . Pneumonia    hx of viral pna years ago  . Shortness of breath dyspnea   . Sleep apnea    uses cpap  . Urgency of urination     Patient Active Problem List   Diagnosis Date Noted  . Morbid (severe) obesity due to excess calories ([facility]) 08/30/2019  . Primary osteoarthritis, left shoulder 08/30/2019  . DM (diabetes mellitus), type 2 ([facility]) 06/22/2018  . GAD (generalized anxiety disorder) 06/22/2018  . Primary osteoarthritis of left knee 03/10/2017  . Trochanteric bursitis of right hip 03/10/2017  . Anterior dislocation of left shoulder 12/20/2016  . Closed fracture of proximal end of left humerus with routine healing 12/20/2016  . Dyspnea on exertion 07/27/2016  . RBBB 03/04/2016  . OSA on CPAP 03/04/2016  . ILD (interstitial lung disease) ([facility])  c/w NSIP p macrodantin exp 02/11/2016  . Morbid obesity due to excess calories ([facility]) 02/11/2016  . Chronic  respiratory failure with hypoxia ([facility]) 01/07/2016  . Upper airway cough syndrome 12/06/2015  . COPD GOLD 0 12/05/2015  . Acute cholecystitis 07/19/2014    Past Surgical History:  Procedure Laterality Date  . ABDOMINAL HYSTERECTOMY    . CARPAL TUNNEL RELEASE    . CHOLECYSTECTOMY N/A 07/19/2014   Procedure: LAPAROSCOPIC CHOLECYSTECTOMY WITH INTRAOPERATIVE CHOLANGIOGRAM;  Surgeon: Violeta Gelinas, MD;  Location: [facility] OR;  Service: General;  Laterality: N/A;  . COLONOSCOPY  ?   2011  . KNEE ARTHROSCOPY    . TONSILLECTOMY       OB History   No obstetric history on file.     Family History  Problem Relation Age of Onset  . Stroke Mother   . Lung cancer Father        smoked  . Colon cancer Father   . Cancer Paternal Grandmother   . Cancer Paternal Grandfather     Social History   Tobacco Use  . Smoking status: Former Smoker    Packs/day: 2.00    Years: 30.00    Pack years: 60.00    Types: Cigarettes    Quit date: 05/31/1996    Years since quitting: 23.3  . Smokeless tobacco: Never Used  Substance Use Topics  . Alcohol use: No    Alcohol/week: 0.0 standard drinks  Comment: rare  . Drug use: No    Home Medications Prior to Admission medications   Medication Sig Start Date End Date Taking? Authorizing Provider  acetaminophen (TYLENOL) 500 MG tablet Take 500 mg by mouth every 6 (six) hours as needed.    [provider]  albuterol (PROVENTIL HFA;VENTOLIN HFA) 108 (90 Base) MCG/ACT inhaler Inhale 2 puffs into the lungs every 6 (six) hours as needed for wheezing or shortness of breath. 07/12/17   Nyoka Cowden, MD  aspirin EC 81 MG tablet Take 81 mg by mouth at bedtime.    [provider]  chlorpheniramine (CHLOR-TRIMETON) 4 MG tablet Take 4 mg by mouth at bedtime.    [provider]  Cholecalciferol (VITAMIN D) 2000 UNITS CAPS Take 2,000 Units by mouth every evening.    [provider]  cholestyramine light (PREVALITE) 4 g packet Take 4 g  by mouth daily as needed (cholesterol).     [provider]  escitalopram (LEXAPRO) 10 MG tablet TAKE 1 TABLET BY MOUTH EVERYDAY AT BEDTIME 08/20/19   Philip Aspen, Limmie Patricia, MD  famotidine (PEPCID) 20 MG tablet Take 20 mg by mouth at bedtime as needed for heartburn or indigestion.    [provider]  gabapentin (NEURONTIN) 400 MG capsule Take 400 mg by mouth at bedtime.    [provider]  ibuprofen (ADVIL,MOTRIN) 800 MG tablet Take 1 tablet (800 mg total) by mouth every 8 (eight) hours as needed. 10/28/16   Adonis Huguenin, NP  MYRBETRIQ 50 MG TB24 tablet TAKE 1 TABLET BY MOUTH EVERY DAY 08/08/19   Philip Aspen, Limmie Patricia, MD  OXYGEN 2lpm with sleep and 3lpm with exertion [facility]    [provider]  simvastatin (ZOCOR) 20 MG tablet TAKE 1 TABLET BY MOUTH EVERYDAY AT BEDTIME 06/05/19   Philip Aspen, Limmie Patricia, MD    Allergies    Erythromycin, Bactrim [sulfamethoxazole-trimethoprim], and Fosamax  [alendronate sodium]  Review of Systems   Review of Systems  All other systems reviewed and are negative.   Physical Exam Updated Vital Signs SpO2 99%   Physical Exam Vitals and nursing note reviewed.  Constitutional:      General: She is not in acute distress.    Appearance: Normal appearance. She is well-developed. She is not toxic-appearing.  HENT:     Head: Normocephalic and atraumatic.  Eyes:     General: Lids are normal.     Conjunctiva/sclera: Conjunctivae normal.     Pupils: Pupils are equal, round, and reactive to light.  Neck:     Thyroid: No thyroid mass.     Trachea: No tracheal deviation.  Cardiovascular:     Rate and Rhythm: Normal rate and regular rhythm.     Heart sounds: Normal heart sounds. No murmur. No gallop.   Pulmonary:     Effort: Pulmonary effort is normal. No respiratory distress.     Breath sounds: Normal breath sounds. No stridor. No decreased breath sounds, wheezing, rhonchi or rales.  Abdominal:     General: Bowel  sounds are normal. There is no distension.     Palpations: Abdomen is soft.     Tenderness: There is no abdominal tenderness. There is no rebound.  Musculoskeletal:     Right shoulder: Tenderness and bony tenderness present. Decreased range of motion.     Cervical back: Normal range of motion and neck supple.     Comments: Patient neurovascular intact at right hand.  No pain with range of  motion of right elbow or right wrist.  Skin:    General: Skin is warm and dry.     Findings: No abrasion or rash.  Neurological:     Mental Status: She is alert and oriented to person, place, and time.     GCS: GCS eye subscore is 4. GCS verbal subscore is 5. GCS motor subscore is 6.     Cranial Nerves: No cranial nerve deficit.     Sensory: No sensory deficit.  Psychiatric:        Speech: Speech normal.        Behavior: Behavior normal.     ED Results / Procedures / Treatments   Labs (all labs ordered are listed, but only abnormal results are displayed) Labs Reviewed - No data to display  EKG None  Radiology No results found.  Procedures Procedures (including critical care time)  Medications Ordered in ED Medications - No data to display  ED Course  I have reviewed the triage vital signs and the nursing notes.  Pertinent labs & imaging results that were available during my care of the patient were reviewed by me and considered in my medical decision making (see chart for details).    MDM Rules/Calculators/A&P                      Patient with evidence of proximal humerus fracture.  Patient given a sling by orthopedics.  Given Percocet here orally for pain.  Patient is in pain management does have prescription for Percocet which I informed her to take.  She will follow with her orthopedist Final Clinical Impression(s) / ED Diagnoses Final diagnoses:  None    Rx / DC Orders ED Discharge Orders    None       Lacretia Leigh, MD 09/26/19 1819

## 2019-09-26 NOTE — Progress Notes (Signed)
Orthopedic Tech Progress Note Patient Details:  Lindsay Brady 03-18-1951 [id number]  Ortho Devices Type of Ortho Device: Shoulder immobilizer Ortho Device/Splint Location: right Ortho Device/Splint Interventions: Application   Post Interventions Patient Tolerated: Well Instructions Provided: Care of device   [name] 09/26/2019, 6:19 PM

## 2019-09-26 NOTE — ED Triage Notes (Signed)
Pt BIBA   Per EMS-  Pt had mechanical fall, tripped over threshold.  Pt reports landing on right shoulder/face.  Pt c/o back pain, face pain. Pt denies LOC, denies blood thinners.   AOx4, ambulatory at baseline.   20 g L hand   100 mcg fentanyl - last administered 1702.

## 2019-09-26 NOTE — ED Notes (Signed)
Discharge paperwork reviewed with pt, pt called her daughter who said she would come pick her up.  Pt assisted to wheelchair, wheeled to ED entrance to wait for daughter.

## 2019-09-27 ENCOUNTER — Ambulatory Visit (INDEPENDENT_AMBULATORY_CARE_PROVIDER_SITE_OTHER): Payer: Medicare Other | Admitting: Orthopaedic Surgery

## 2019-09-27 ENCOUNTER — Encounter: Payer: Self-pay | Admitting: Orthopaedic Surgery

## 2019-09-27 DIAGNOSIS — S42293A Other displaced fracture of upper end of unspecified humerus, initial encounter for closed fracture: Secondary | ICD-10-CM | POA: Insufficient documentation

## 2019-09-27 DIAGNOSIS — S42291A Other displaced fracture of upper end of right humerus, initial encounter for closed fracture: Secondary | ICD-10-CM

## 2019-09-27 NOTE — Progress Notes (Signed)
Office Visit Note   Patient: Lindsay Brady           Date of Birth: [birth year]           MRN: [id number] Visit Date: 09/27/2019              Requested by: Philip Aspen, Limmie Patricia, MD [address] [place],  [place] [address] PCP: Philip Aspen, Limmie Patricia, MD   Assessment & Plan: Visit Diagnoses:  1. Closed fracture of head of right humerus, initial encounter     Plan: Lindsay Brady fell yesterday and sustained a nondisplaced comminuted right humeral head fracture.  She was seen in the emergency room and placed in a shoulder immobilizer.  I reviewed the films which demonstrate a comminuted humeral head but nicely position on the humeral shaft.  We will follow her weekly for the next several weeks with follow-up films.  Renew her prescription for Percocet from her pain clinic as she was given only a few in the emergency room.  Continue with the shoulder immobilizer  Follow-Up Instructions: Return in about 1 week (around 10/04/2019).   Orders:  No orders of the defined types were placed in this encounter.  No orders of the defined types were placed in this encounter.     Procedures: No procedures performed   Clinical Data: No additional findings.   Subjective: Chief Complaint  Patient presents with  . Right Shoulder - Injury    DOI 09/26/2019  Patient presents today for pain in her right shoulder. She fell yesterday and injured her right arm. She went to [facility] ED and had x-rays taken. She was diagnosed with a right proximal humerus fracture. She was given Percocet 5/325 at the hospital to take every 4 hours. She just went to Pain Management for the first time yesterday before the fall and was given a script for Percocet 5/325 to take twice daily and has not filled it yet. She has questions about her pain medications going forward and what to fill or refill. She is currently in a sling. She is right hand dominant. She is having difficulty sleeping because now  both shoulders are causing her pain.  I reviewed the films of her right shoulder in the PACS system demonstrating a comminuted humeral head fracture but without displacement  HPI  Review of Systems   Objective: Vital Signs: Ht 5\' 5"  (1.651 m)   Wt 257 lb (116.6 kg)   BMI 42.77 kg/m   Physical Exam Constitutional:      Appearance: She is well-developed.  Eyes:     Pupils: Pupils are equal, round, and reactive to light.  Pulmonary:     Effort: Pulmonary effort is normal.  Skin:    General: Skin is warm and dry.  Neurological:     Mental Status: She is alert and oriented to person, place, and time.  Psychiatric:        Behavior: Behavior normal.     Ortho Exam awake alert and oriented x3.  Comfortable sitting.  No distress.  Shoulder immobilizer to right upper extremity.  No swelling of hand.  Neurologically intact.  Range of motion of shoulder was not performed  Specialty Comments:  No specialty comments available.  Imaging: DG Shoulder Right  Result Date: 09/26/2019 CLINICAL DATA:  Recent fall with right shoulder pain, initial encounter EXAM: RIGHT SHOULDER - 2+ VIEW COMPARISON:  None. FINDINGS: Comminuted fracture of the proximal right humerus is noted involving primarily the surgical neck.  The humeral head remains well seated. Degenerative changes of the acromioclavicular joint are noted. No other bony abnormality is seen. IMPRESSION: Acute comminuted fracture of the proximal right humerus as described. Electronically Signed   By: Inez Catalina M.D.   On: 09/26/2019 18:09     PMFS History: Patient Active Problem List   Diagnosis Date Noted  . Fracture, humerus, head 09/27/2019  . Morbid (severe) obesity due to excess calories ([place]) 08/30/2019  . Primary osteoarthritis, left shoulder 08/30/2019  . DM (diabetes mellitus), type 2 ([employer]) 06/22/2018  . GAD (generalized anxiety disorder) 06/22/2018  . Primary osteoarthritis of left knee 03/10/2017  . Trochanteric bursitis  of right hip 03/10/2017  . Anterior dislocation of left shoulder 12/20/2016  . Closed fracture of proximal end of left humerus with routine healing 12/20/2016  . Dyspnea on exertion 07/27/2016  . RBBB 03/04/2016  . OSA on CPAP 03/04/2016  . ILD (interstitial lung disease) ([place])  c/w NSIP p macrodantin exp 02/11/2016  . Morbid obesity due to excess calories ([place]) 02/11/2016  . Chronic respiratory failure with hypoxia ([place]) 01/07/2016  . Upper airway cough syndrome 12/06/2015  . COPD GOLD 0 12/05/2015  . Acute cholecystitis 07/19/2014   Past Medical History:  Diagnosis Date  . Anxiety   . Arthritis   . Cholecystitis 07/2014  . COPD (chronic obstructive pulmonary disease) ([place])   . Diabetes mellitus without complication ([place])    type 2   . Fracture 2013 ?   left leg  . GERD (gastroesophageal reflux disease)    hx of gerd  . Headache   . Hx: UTI (urinary tract infection)   . OA (osteoarthritis) of knee   . Pneumonia    hx of viral pna years ago  . Shortness of breath dyspnea   . Sleep apnea    uses cpap  . Urgency of urination     Family History  Problem Relation Age of Onset  . Stroke Mother   . Lung cancer Father        smoked  . Colon cancer Father   . Cancer Paternal Grandmother   . Cancer Paternal Grandfather     Past Surgical History:  Procedure Laterality Date  . ABDOMINAL HYSTERECTOMY    . CARPAL TUNNEL RELEASE    . CHOLECYSTECTOMY N/A 07/19/2014   Procedure: LAPAROSCOPIC CHOLECYSTECTOMY WITH INTRAOPERATIVE CHOLANGIOGRAM;  Surgeon: Georganna Skeans, MD;  Location: [facility];  Service: General;  Laterality: N/A;  . COLONOSCOPY  ?   2011  . KNEE ARTHROSCOPY    . TONSILLECTOMY     Social History   Occupational History  . Not on file  Tobacco Use  . Smoking status: Former Smoker    Packs/day: 2.00    Years: 30.00    Pack years: 60.00    Types: Cigarettes    Quit date: 05/31/1996    Years since quitting: 23.3  . Smokeless tobacco: Never Used  Substance and  Sexual Activity  . Alcohol use: No    Alcohol/week: 0.0 standard drinks    Comment: rare  . Drug use: No  . Sexual activity: Not on file

## 2019-10-01 ENCOUNTER — Telehealth: Payer: Self-pay | Admitting: Orthopaedic Surgery

## 2019-10-01 NOTE — Telephone Encounter (Signed)
Patient called. She would like a refill on pain medication. Says she is unable to reach her pain management doctor. Percocet. Her call back number is [phone]

## 2019-10-01 NOTE — Telephone Encounter (Signed)
Please advise. Patient was here last Thursday and you spoke with her about the Percocet. She filled the script that her pain management gave her.

## 2019-10-02 ENCOUNTER — Telehealth: Payer: Self-pay

## 2019-10-02 ENCOUNTER — Telehealth: Payer: Self-pay | Admitting: Orthopaedic Surgery

## 2019-10-02 ENCOUNTER — Other Ambulatory Visit: Payer: Self-pay | Admitting: Orthopaedic Surgery

## 2019-10-02 MED ORDER — OXYCODONE-ACETAMINOPHEN 5-325 MG PO TABS: 1.0000 | ORAL_TABLET | Freq: Three times a day (TID) | ORAL | 0 refills | Status: DC | PRN

## 2019-10-02 NOTE — Telephone Encounter (Signed)
-  Spoke with patient regarding her medication at [facility]. It is to early for them to fill her medication because it should have lasted 9 days and was just filled 5 days ago. She is currently under a pain management contract with Laurence Aly, PA-C at [facility] on [address] in [place]. Patient states that she has tried calling multiple times daily since last week to reach them, and cannot get anyone to answer or return her calls. Dr.Whitfield has advised her that she really needs to speak with them regarding pain medicine because they could fire her as a patient for obtaining pain medicine anywhere else, and he is not going to continue to prescribe her pain medicine in the future. She again states that nobody has returned her call with Laurence Aly.  After getting off the phone with patient I was able to call Eugene Garnet at #[phone] and speak with his nurse immediately without having to leave a message or any difficulty getting through on the scheduling line. I explained to his nurse about her recent fall and fracture and the need for increased pain medicine. I also explained that patient has tried multiple times to reach someone, but has not been successful. She apologized but states that she has not received any calls from patient at all, or she would have called her back immediately because they understand she would need more medication due to increased pain from the fracture. She states that she will call patient immediately and speak with her. She advised [place] to not prescribe any pain medications for patient or that would result in patient's breech in her contract and she would not receive anymore pain medicine from them. I did let nurse know that Dr.Whitfield called in Percocet 5/325 #30 earlier today, but that it had not been filled yet because it was to early.

## 2019-10-02 NOTE — Telephone Encounter (Signed)
sent 

## 2019-10-02 NOTE — Telephone Encounter (Signed)
error 

## 2019-10-02 NOTE — Telephone Encounter (Signed)
Called patient. She states that [employer] office did call and speak with her regarding her pain medicine. I have faxed the last office note to him at [phone], per patient's request.

## 2019-10-02 NOTE — Telephone Encounter (Signed)
Will see Thurs

## 2019-10-02 NOTE — Telephone Encounter (Signed)
Patient's pharmacy will not fill her RX due to it being an early refill.  She needs our office to call the pharmacy to let them know that it is okay to fill it early.  [facility] [address] She would like to pick the RX up today.  CB#[phone].  Thank you.

## 2019-10-02 NOTE — Telephone Encounter (Signed)
Patient returned your call.

## 2019-10-02 NOTE — Telephone Encounter (Signed)
Spoke with patient. Scheduled appointment for Thursday for follow up.  I told her that the SIG was 1 tablet every 8 hours. She was not happy and states that she needs to be taking more. I told her that I would relay the message and if you felt it needed to be changed, I would call her back.

## 2019-10-03 DIAGNOSIS — S42309A Unspecified fracture of shaft of humerus, unspecified arm, initial encounter for closed fracture: Secondary | ICD-10-CM | POA: Diagnosis not present

## 2019-10-03 DIAGNOSIS — Z79899 Other long term (current) drug therapy: Secondary | ICD-10-CM | POA: Diagnosis not present

## 2019-10-03 DIAGNOSIS — E119 Type 2 diabetes mellitus without complications: Secondary | ICD-10-CM | POA: Diagnosis not present

## 2019-10-03 DIAGNOSIS — J449 Chronic obstructive pulmonary disease, unspecified: Secondary | ICD-10-CM | POA: Diagnosis not present

## 2019-10-03 DIAGNOSIS — Z03818 Encounter for observation for suspected exposure to other biological agents ruled out: Secondary | ICD-10-CM | POA: Diagnosis not present

## 2019-10-03 DIAGNOSIS — R062 Wheezing: Secondary | ICD-10-CM | POA: Diagnosis not present

## 2019-10-04 ENCOUNTER — Ambulatory Visit: Payer: Self-pay

## 2019-10-04 ENCOUNTER — Other Ambulatory Visit: Payer: Self-pay

## 2019-10-04 ENCOUNTER — Ambulatory Visit (INDEPENDENT_AMBULATORY_CARE_PROVIDER_SITE_OTHER): Payer: Medicare Other | Admitting: Orthopaedic Surgery

## 2019-10-04 ENCOUNTER — Encounter: Payer: Self-pay | Admitting: Orthopaedic Surgery

## 2019-10-04 VITALS — Ht 65.0 in | Wt 257.0 lb

## 2019-10-04 DIAGNOSIS — S42291A Other displaced fracture of upper end of right humerus, initial encounter for closed fracture: Secondary | ICD-10-CM

## 2019-10-04 NOTE — Progress Notes (Signed)
Office Visit Note   Patient: Lindsay Brady           Date of Birth: [birth year]           MRN: [id number] Visit Date: 10/04/2019              Requested by: Isaac Bliss, Rayford Halsted, MD [place],  [place] [address] PCP: Isaac Bliss, Rayford Halsted, MD   Assessment & Plan: Visit Diagnoses:  1. Closed fracture of head of right humerus, initial encounter     Plan:  #1: Continue sling/shoulder immobilizer.  Again instructed her in careful motion of the shoulder.  Follow-Up Instructions: Return in about 2 weeks (around 10/18/2019).   Orders:  Orders Placed This Encounter  Procedures  . XR Humerus Right  . XR Forearm Right   No orders of the defined types were placed in this encounter.     Procedures: No procedures performed   Clinical Data: No additional findings.   Subjective: Chief Complaint  Patient presents with  . Right Shoulder - Follow-up    DOI 09/26/2019   HPI Patient presents today for follow up on her right proximal humerus fracture. She is now 8 days out from injury. She states that she is still hurting. She is getting her pain medicine from pain management. She is wearing a sling. She states that her left shoulder has increased in pain since having to use that arm more. She wants to talk about cortisone injection in the left shoulder. She also states that she has been hurting and tender to the touch in her forearm and just above her elbow. She is requesting x-rays of those areas as well.  Denies any numbness or tingling.   Review of Systems  Constitutional: Negative for fatigue.  HENT: Negative for ear pain.   Eyes: Negative for pain.  Respiratory: Negative for shortness of breath.   Cardiovascular: Positive for leg swelling.  Gastrointestinal: Negative for constipation and diarrhea.  Endocrine: Negative for cold intolerance and heat intolerance.  Genitourinary: Negative for difficulty urinating.  Musculoskeletal: Negative for  joint swelling.  Skin: Negative for rash.  Allergic/Immunologic: Negative for food allergies.  Neurological: Negative for weakness.  Hematological: Does not bruise/bleed easily.  Psychiatric/Behavioral: Positive for sleep disturbance.     Objective: Vital Signs: Ht 5\' 5"  (1.651 m)   Wt 257 lb (116.6 kg)   BMI 42.77 kg/m   Physical Exam Constitutional:      Appearance: Normal appearance. She is well-developed. She is obese.  HENT:     Head: Normocephalic.  Eyes:     Pupils: Pupils are equal, round, and reactive to light.  Pulmonary:     Effort: Pulmonary effort is normal.  Skin:    General: Skin is warm and dry.  Neurological:     Mental Status: She is oriented to person, place, and time.  Psychiatric:        Behavior: Behavior normal.     Ortho Exam  Exam today reveals tenderness to palpation over the proximal humerus area.  Not particularly increased in swelling.  She really does not have much in the way of any pain to palpation over the distal humerus and proximal radius.  She can pronate and summa date the forearm without pain or discomfort.  Good radial pulse.  Sensations intact light touch.  Good grip strength.  Specialty Comments:  No specialty comments available.  Imaging: XR Forearm Right  Result Date: 10/04/2019 2 view x-ray of the  forearm reveals no occult fractures noted.  XR Humerus Right  Result Date: 10/04/2019 2 view x-ray of the right humerus reveals position alignment of the humeral head neck fracture or unchanged.  This is reviewed by Dr. Cleophas Dunker also.  He does have some pseudosubluxation glenohumeral joint.    PMFS History: Current Outpatient Medications  Medication Sig Dispense Refill  . acetaminophen (TYLENOL) 500 MG tablet Take 500 mg by mouth every 6 (six) hours as needed.    Marland Kitchen albuterol (PROVENTIL HFA;VENTOLIN HFA) 108 (90 Base) MCG/ACT inhaler Inhale 2 puffs into the lungs every 6 (six) hours as needed for wheezing or shortness of breath.  1 Inhaler 1  . aspirin EC 81 MG tablet Take 81 mg by mouth at bedtime.    . chlorpheniramine (CHLOR-TRIMETON) 4 MG tablet Take 4 mg by mouth at bedtime.    . Cholecalciferol (VITAMIN D) 2000 UNITS CAPS Take 2,000 Units by mouth every evening.    . cholestyramine light (PREVALITE) 4 g packet Take 4 g by mouth daily as needed (cholesterol).     Marland Kitchen escitalopram (LEXAPRO) 10 MG tablet TAKE 1 TABLET BY MOUTH EVERYDAY AT BEDTIME 90 tablet 0  . famotidine (PEPCID) 20 MG tablet Take 20 mg by mouth at bedtime as needed for heartburn or indigestion.    . gabapentin (NEURONTIN) 400 MG capsule Take 400 mg by mouth at bedtime.    Marland Kitchen ibuprofen (ADVIL,MOTRIN) 800 MG tablet Take 1 tablet (800 mg total) by mouth every 8 (eight) hours as needed. 90 tablet 0  . MYRBETRIQ 50 MG TB24 tablet TAKE 1 TABLET BY MOUTH EVERY DAY 90 tablet 1  . oxyCODONE-acetaminophen (PERCOCET/ROXICET) 5-325 MG tablet Take 1-2 tablets by mouth every 4 (four) hours as needed for severe pain. 15 tablet 0  . oxyCODONE-acetaminophen (PERCOCET/ROXICET) 5-325 MG tablet Take 1 tablet by mouth every 8 (eight) hours as needed for severe pain. 30 tablet 0  . OXYGEN 2lpm with sleep and 3lpm with exertion [facility]    . simvastatin (ZOCOR) 20 MG tablet TAKE 1 TABLET BY MOUTH EVERYDAY AT BEDTIME 90 tablet 3   No current facility-administered medications for this visit.    Patient Active Problem List   Diagnosis Date Noted  . Fracture, humerus, head 09/27/2019  . Morbid (severe) obesity due to excess calories ([facility]) 08/30/2019  . Primary osteoarthritis, left shoulder 08/30/2019  . DM (diabetes mellitus), type 2 ([facility]) 06/22/2018  . GAD (generalized anxiety disorder) 06/22/2018  . Primary osteoarthritis of left knee 03/10/2017  . Trochanteric bursitis of right hip 03/10/2017  . Anterior dislocation of left shoulder 12/20/2016  . Closed fracture of proximal end of left humerus with routine healing 12/20/2016  . Dyspnea on exertion 07/27/2016  . RBBB  03/04/2016  . OSA on CPAP 03/04/2016  . ILD (interstitial lung disease) ([facility])  c/w NSIP p macrodantin exp 02/11/2016  . Morbid obesity due to excess calories ([facility]) 02/11/2016  . Chronic respiratory failure with hypoxia ([facility]) 01/07/2016  . Upper airway cough syndrome 12/06/2015  . COPD GOLD 0 12/05/2015  . Acute cholecystitis 07/19/2014   Past Medical History:  Diagnosis Date  . Anxiety   . Arthritis   . Cholecystitis 07/2014  . COPD (chronic obstructive pulmonary disease) ([facility])   . Diabetes mellitus without complication ([facility])    type 2   . Fracture 2013 ?   left leg  . GERD (gastroesophageal reflux disease)    hx of gerd  . Headache   . Hx: UTI (urinary tract infection)   .  OA (osteoarthritis) of knee   . Pneumonia    hx of viral pna years ago  . Shortness of breath dyspnea   . Sleep apnea    uses cpap  . Urgency of urination     Family History  Problem Relation Age of Onset  . Stroke Mother   . Lung cancer Father        smoked  . Colon cancer Father   . Cancer Paternal Grandmother   . Cancer Paternal Grandfather     Past Surgical History:  Procedure Laterality Date  . ABDOMINAL HYSTERECTOMY    . CARPAL TUNNEL RELEASE    . CHOLECYSTECTOMY N/A 07/19/2014   Procedure: LAPAROSCOPIC CHOLECYSTECTOMY WITH INTRAOPERATIVE CHOLANGIOGRAM;  Surgeon: Violeta Gelinas, MD;  Location: [facility];  Service: General;  Laterality: N/A;  . COLONOSCOPY  ?   2011  . KNEE ARTHROSCOPY    . TONSILLECTOMY     Social History   Occupational History  . Not on file  Tobacco Use  . Smoking status: Former Smoker    Packs/day: 2.00    Years: 30.00    Pack years: 60.00    Types: Cigarettes    Quit date: 05/31/1996    Years since quitting: 23.3  . Smokeless tobacco: Never Used  Substance and Sexual Activity  . Alcohol use: No    Alcohol/week: 0.0 standard drinks    Comment: rare  . Drug use: No  . Sexual activity: Not on file

## 2019-10-08 ENCOUNTER — Telehealth: Payer: Self-pay | Admitting: Orthopaedic Surgery

## 2019-10-08 NOTE — Telephone Encounter (Signed)
One week

## 2019-10-08 NOTE — Telephone Encounter (Signed)
Spoke with patient. She said that she was told to follow up in one week, but the dictation states to follow up in two weeks. She wanted to see which one was the correct time frame. Please advise.

## 2019-10-08 NOTE — Telephone Encounter (Signed)
Patient called asked if she need to see Dr Cleophas Dunker 2 wk or 2 wks. The number to contact patient is [phone]

## 2019-10-09 ENCOUNTER — Ambulatory Visit: Payer: Medicare Other | Admitting: Orthopaedic Surgery

## 2019-10-09 ENCOUNTER — Telehealth: Payer: Self-pay | Admitting: Orthopaedic Surgery

## 2019-10-09 ENCOUNTER — Telehealth: Payer: Self-pay

## 2019-10-09 NOTE — Telephone Encounter (Signed)
Spoke with patient today on the phone. She is wanting to get scheduled for her left shoulder injection with Dr.Newton. She states that someone called her to schedule, but she accidentally deleted the voicemail with the call back number. Please contact patient to schedule. [phone]. Thanks!

## 2019-10-09 NOTE — Telephone Encounter (Signed)
Spoke with patient and relayed information. 

## 2019-10-09 NOTE — Telephone Encounter (Signed)
Called pt and lvm #1 to get her scheduled.

## 2019-10-09 NOTE — Telephone Encounter (Signed)
Patient would like for you to give her a call.  CB#[phone].  Thank you.

## 2019-10-10 NOTE — Telephone Encounter (Signed)
Scheduled for 5-20

## 2019-10-11 ENCOUNTER — Ambulatory Visit: Payer: Medicare Other | Admitting: Orthopaedic Surgery

## 2019-10-16 ENCOUNTER — Ambulatory Visit: Payer: Medicare Other | Admitting: Orthopedic Surgery

## 2019-10-17 DIAGNOSIS — S42309A Unspecified fracture of shaft of humerus, unspecified arm, initial encounter for closed fracture: Secondary | ICD-10-CM | POA: Diagnosis not present

## 2019-10-17 DIAGNOSIS — Z03818 Encounter for observation for suspected exposure to other biological agents ruled out: Secondary | ICD-10-CM | POA: Diagnosis not present

## 2019-10-17 DIAGNOSIS — E119 Type 2 diabetes mellitus without complications: Secondary | ICD-10-CM | POA: Diagnosis not present

## 2019-10-17 DIAGNOSIS — Z79899 Other long term (current) drug therapy: Secondary | ICD-10-CM | POA: Diagnosis not present

## 2019-10-17 DIAGNOSIS — M25512 Pain in left shoulder: Secondary | ICD-10-CM | POA: Diagnosis not present

## 2019-10-18 ENCOUNTER — Encounter: Payer: Self-pay | Admitting: Physical Medicine and Rehabilitation

## 2019-10-18 ENCOUNTER — Ambulatory Visit: Payer: Medicare Other | Admitting: Physical Medicine and Rehabilitation

## 2019-10-18 ENCOUNTER — Ambulatory Visit: Payer: Self-pay

## 2019-10-18 ENCOUNTER — Other Ambulatory Visit: Payer: Self-pay

## 2019-10-18 DIAGNOSIS — M25512 Pain in left shoulder: Secondary | ICD-10-CM | POA: Diagnosis not present

## 2019-10-18 DIAGNOSIS — G8929 Other chronic pain: Secondary | ICD-10-CM

## 2019-10-18 MED ORDER — TRIAMCINOLONE ACETONIDE 40 MG/ML IJ SUSP
60.0000 mg | INTRAMUSCULAR | Status: AC | PRN
  Administered 2019-10-18: 60 mg via INTRA_ARTICULAR

## 2019-10-18 MED ORDER — BUPIVACAINE HCL 0.5 % IJ SOLN
3.0000 mL | INTRAMUSCULAR | Status: AC | PRN
  Administered 2019-10-18: 3 mL via INTRA_ARTICULAR

## 2019-10-18 NOTE — Progress Notes (Signed)
Pt states pain in the left shoulder and radiates into the left upper arm at times. Pt states pain started 2 months ago. Using the left arm makes pain worse. Resting and not moving left arm helps with pain.   .Numeric Pain Rating Scale and Functional Assessment Average Pain 8   In the last MONTH (on 0-10 scale) has pain interfered with the following?  1. General activity like being  able to carry out your everyday physical activities such as walking, climbing stairs, carrying groceries, or moving a chair?  Rating(8)   -Dye Allergies.

## 2019-10-18 NOTE — Progress Notes (Signed)
   Lindsay Brady - 68 y.o. female MRN [id number]  Date of birth: 1950/11/29  Office Visit Note: Visit Date: 10/18/2019 PCP: Philip Aspen, Limmie Patricia, MD Referred by: Philip Aspen, [place]*  Subjective: Chief Complaint  Patient presents with  . Left Shoulder - Pain  . Left Upper Arm - Pain   HPI:  Lindsay Brady is a 69 y.o. female who comes in today For planned left intra-articular glenohumeral joint injection with fluoroscopic guidance.  This is requested by Dr. Burnard Bunting.  She is also been followed by Dr. Norlene Campbell initially.  In the interim between the shot getting scheduled because of a hard time getting hold the patient and then recently she has had fracture of the right proximal humerus.  I will ask her to go ahead and follow-up with Dr. Cleophas Dunker as she has been seeing him for that.  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  ROS Otherwise per HPI.  Assessment & Plan: Visit Diagnoses:  1. Chronic left shoulder pain     Plan: No additional findings.   Meds & Orders: No orders of the defined types were placed in this encounter.   Orders Placed This Encounter  Procedures  . Large Joint Inj: L glenohumeral  . XR C-ARM NO REPORT    Follow-up: Return for visit to requesting physician as needed.   Procedures: Large Joint Inj: L glenohumeral on 10/18/2019 10:45 AM Indications: pain and diagnostic evaluation Details: 22 G 3.5 in needle, fluoroscopy-guided anteromedial approach  Arthrogram: No  Medications: 3 mL bupivacaine 0.5 %; 60 mg triamcinolone acetonide 40 MG/ML Outcome: tolerated well, no immediate complications  There was excellent flow of contrast producing a partial arthrogram of the glenohumeral joint. The patient did have relief of symptoms during the anesthetic phase of the injection. Procedure, treatment alternatives, risks and benefits explained, specific risks discussed. Consent was given by the patient.  Immediately prior to procedure a time out was called to verify the correct patient, procedure, equipment, support staff and site/side marked as required. Patient was prepped and draped in the usual sterile fashion.      No notes on file   Clinical History: No specialty comments available.     Objective:  VS:  HT:    WT:   BMI:     BP:   HR: bpm  TEMP: ( )  RESP:  Physical Exam   Imaging: XR C-ARM NO REPORT  Result Date: 10/18/2019 Please see Notes tab for imaging impression.

## 2019-10-31 DIAGNOSIS — Z79899 Other long term (current) drug therapy: Secondary | ICD-10-CM | POA: Diagnosis not present

## 2019-10-31 DIAGNOSIS — E119 Type 2 diabetes mellitus without complications: Secondary | ICD-10-CM | POA: Diagnosis not present

## 2019-10-31 DIAGNOSIS — M25512 Pain in left shoulder: Secondary | ICD-10-CM | POA: Diagnosis not present

## 2019-10-31 DIAGNOSIS — S42302A Unspecified fracture of shaft of humerus, left arm, initial encounter for closed fracture: Secondary | ICD-10-CM | POA: Diagnosis not present

## 2019-10-31 DIAGNOSIS — Z1159 Encounter for screening for other viral diseases: Secondary | ICD-10-CM | POA: Diagnosis not present

## 2019-11-01 ENCOUNTER — Ambulatory Visit: Payer: Medicare Other | Admitting: Orthopaedic Surgery

## 2019-11-03 DIAGNOSIS — R062 Wheezing: Secondary | ICD-10-CM | POA: Diagnosis not present

## 2019-11-03 DIAGNOSIS — J449 Chronic obstructive pulmonary disease, unspecified: Secondary | ICD-10-CM | POA: Diagnosis not present

## 2019-11-07 ENCOUNTER — Ambulatory Visit: Payer: Medicare Other | Admitting: Orthopedic Surgery

## 2019-11-07 ENCOUNTER — Ambulatory Visit: Payer: Self-pay

## 2019-11-07 ENCOUNTER — Other Ambulatory Visit: Payer: Self-pay

## 2019-11-07 DIAGNOSIS — S42291A Other displaced fracture of upper end of right humerus, initial encounter for closed fracture: Secondary | ICD-10-CM | POA: Diagnosis not present

## 2019-11-09 ENCOUNTER — Encounter: Payer: Self-pay | Admitting: Orthopedic Surgery

## 2019-11-09 NOTE — Progress Notes (Signed)
Post-Op Visit Note   Patient: Lindsay Brady           Date of Birth: 1951/05/24           MRN: [id number] Visit Date: 11/07/2019 PCP: Isaac Bliss, Rayford Halsted, MD   Assessment & Plan:  Chief Complaint:  Chief Complaint  Patient presents with  . Right Shoulder - Pain   Visit Diagnoses:  1. Closed fracture of head of right humerus, initial encounter     Plan: Lindsay Brady is here for follow-up of right proximal humerus fracture.  She was seeing Dr. Durward Fortes for this but could not follow-up with him because of schedule conflicts.  She has been in the sling.  On exam she has pretty reasonable passive range of motion.  Fracture is moving as a unit.  I like her to do physical therapy for that right shoulder 1 time a week for 4 weeks with 5 pound lifting limit and progress to home exercise program.  Active assisted range of motion and passive range of motion okay.  Come back in 6 weeks for final clinical check.  Radiographs today show reasonable alignment of the fracture with some callus formation present.  Follow-Up Instructions: No follow-ups on file.   Orders:  Orders Placed This Encounter  Procedures  . XR Shoulder Right  . Ambulatory referral to Physical Therapy   No orders of the defined types were placed in this encounter.   Imaging: No results found.  PMFS History: Patient Active Problem List   Diagnosis Date Noted  . Fracture, humerus, head 09/27/2019  . Morbid (severe) obesity due to excess calories ([place]) 08/30/2019  . Primary osteoarthritis, left shoulder 08/30/2019  . DM (diabetes mellitus), type 2 ([place]) 06/22/2018  . GAD (generalized anxiety disorder) 06/22/2018  . Primary osteoarthritis of left knee 03/10/2017  . Trochanteric bursitis of right hip 03/10/2017  . Anterior dislocation of left shoulder 12/20/2016  . Closed fracture of proximal end of left humerus with routine healing 12/20/2016  . Dyspnea on exertion 07/27/2016  . RBBB 03/04/2016  . OSA on CPAP  03/04/2016  . ILD (interstitial lung disease) ([place])  c/w NSIP p macrodantin exp 02/11/2016  . Morbid obesity due to excess calories ([place]) 02/11/2016  . Chronic respiratory failure with hypoxia ([place]) 01/07/2016  . Upper airway cough syndrome 12/06/2015  . COPD GOLD 0 12/05/2015  . Acute cholecystitis 07/19/2014   Past Medical History:  Diagnosis Date  . Anxiety   . Arthritis   . Cholecystitis 07/2014  . COPD (chronic obstructive pulmonary disease) ([place])   . Diabetes mellitus without complication ([place])    type 2   . Fracture 2013 ?   left leg  . GERD (gastroesophageal reflux disease)    hx of gerd  . Headache   . Hx: UTI (urinary tract infection)   . OA (osteoarthritis) of knee   . Pneumonia    hx of viral pna years ago  . Shortness of breath dyspnea   . Sleep apnea    uses cpap  . Urgency of urination     Family History  Problem Relation Age of Onset  . Stroke Mother   . Lung cancer Father        smoked  . Colon cancer Father   . Cancer Paternal Grandmother   . Cancer Paternal Grandfather     Past Surgical History:  Procedure Laterality Date  . ABDOMINAL HYSTERECTOMY    . CARPAL TUNNEL RELEASE    . CHOLECYSTECTOMY  N/A 07/19/2014   Procedure: LAPAROSCOPIC CHOLECYSTECTOMY WITH INTRAOPERATIVE CHOLANGIOGRAM;  Surgeon: Violeta Gelinas, MD;  Location: [facility] OR;  Service: General;  Laterality: N/A;  . COLONOSCOPY  ?   2011  . KNEE ARTHROSCOPY    . TONSILLECTOMY     Social History   Occupational History  . Not on file  Tobacco Use  . Smoking status: Former Smoker    Packs/day: 2.00    Years: 30.00    Pack years: 60.00    Types: Cigarettes    Quit date: 05/31/1996    Years since quitting: 23.4  . Smokeless tobacco: Never Used  Vaping Use  . Vaping Use: Never used  Substance and Sexual Activity  . Alcohol use: No    Alcohol/week: 0.0 standard drinks    Comment: rare  . Drug use: No  . Sexual activity: Not on file

## 2019-11-14 DIAGNOSIS — R0989 Other specified symptoms and signs involving the circulatory and respiratory systems: Secondary | ICD-10-CM | POA: Diagnosis not present

## 2019-11-14 DIAGNOSIS — Z79899 Other long term (current) drug therapy: Secondary | ICD-10-CM | POA: Diagnosis not present

## 2019-11-14 DIAGNOSIS — Z1159 Encounter for screening for other viral diseases: Secondary | ICD-10-CM | POA: Diagnosis not present

## 2019-11-14 DIAGNOSIS — M79604 Pain in right leg: Secondary | ICD-10-CM | POA: Diagnosis not present

## 2019-11-14 DIAGNOSIS — M25512 Pain in left shoulder: Secondary | ICD-10-CM | POA: Diagnosis not present

## 2019-12-03 DIAGNOSIS — R062 Wheezing: Secondary | ICD-10-CM | POA: Diagnosis not present

## 2019-12-03 DIAGNOSIS — J449 Chronic obstructive pulmonary disease, unspecified: Secondary | ICD-10-CM | POA: Diagnosis not present

## 2019-12-11 DIAGNOSIS — E119 Type 2 diabetes mellitus without complications: Secondary | ICD-10-CM | POA: Diagnosis not present

## 2019-12-11 DIAGNOSIS — E559 Vitamin D deficiency, unspecified: Secondary | ICD-10-CM | POA: Diagnosis not present

## 2019-12-11 DIAGNOSIS — Z79899 Other long term (current) drug therapy: Secondary | ICD-10-CM | POA: Diagnosis not present

## 2019-12-11 DIAGNOSIS — Z20822 Contact with and (suspected) exposure to covid-19: Secondary | ICD-10-CM | POA: Diagnosis not present

## 2019-12-11 DIAGNOSIS — M25512 Pain in left shoulder: Secondary | ICD-10-CM | POA: Diagnosis not present

## 2019-12-11 DIAGNOSIS — E7800 Pure hypercholesterolemia, unspecified: Secondary | ICD-10-CM | POA: Diagnosis not present

## 2019-12-12 ENCOUNTER — Other Ambulatory Visit: Payer: Self-pay

## 2019-12-12 ENCOUNTER — Ambulatory Visit: Payer: Medicare Other | Attending: Orthopedic Surgery | Admitting: Physical Therapy

## 2019-12-12 ENCOUNTER — Encounter: Payer: Self-pay | Admitting: Physical Therapy

## 2019-12-12 DIAGNOSIS — M25511 Pain in right shoulder: Secondary | ICD-10-CM | POA: Diagnosis not present

## 2019-12-12 DIAGNOSIS — M6281 Muscle weakness (generalized): Secondary | ICD-10-CM | POA: Diagnosis not present

## 2019-12-12 DIAGNOSIS — M25611 Stiffness of right shoulder, not elsewhere classified: Secondary | ICD-10-CM | POA: Diagnosis not present

## 2019-12-12 NOTE — Patient Instructions (Signed)
Access Code: [email or web address] URL: [email or web address] Date: 12/12/2019 Prepared by: Dwana Curd  Exercises Seated Shoulder Flexion Towel Slide at Table Top - 1 x daily - 7 x weekly - 3 sets - 10 reps Seated Shoulder External Rotation PROM on Table - 1 x daily - 7 x weekly - 3 sets - 10 reps Seated Shoulder Flexion AAROM with Pulley Behind - 1 x daily - 7 x weekly - 3 sets - 10 reps Seated Single Arm Shoulder Flexion with Dumbbells - 3 x daily - 7 x weekly - 1 sets - 10 reps - 0-1 lb hold

## 2019-12-12 NOTE — Therapy (Addendum)
[facility] [address] [place], [place], [address] Phone: [phone]   Fax:  [phone]  Physical Therapy Evaluation  Patient Details  Name: Lindsay Brady MRN: [id number] Date of Birth: 11/11/1950 Referring Provider (PT): August Saucer Corrie Mckusick, MD   Encounter Date: 12/12/2019   PT End of Session - 12/12/19 1630    Visit Number 1    Date for PT Re-Evaluation 03/05/20    PT Start Time 1621    PT Stop Time 1700    PT Time Calculation (min) 39 min    Activity Tolerance Patient tolerated treatment well    Behavior During Therapy [facility] for tasks assessed/performed           Past Medical History:  Diagnosis Date  . Anxiety   . Arthritis   . Cholecystitis 07/2014  . COPD (chronic obstructive pulmonary disease) ([facility])   . Diabetes mellitus without complication ([facility])    type 2   . Fracture 2013 ?   left leg  . GERD (gastroesophageal reflux disease)    hx of gerd  . Headache   . Hx: UTI (urinary tract infection)   . OA (osteoarthritis) of knee   . Pneumonia    hx of viral pna years ago  . Shortness of breath dyspnea   . Sleep apnea    uses cpap  . Urgency of urination     Past Surgical History:  Procedure Laterality Date  . ABDOMINAL HYSTERECTOMY    . CARPAL TUNNEL RELEASE    . CHOLECYSTECTOMY N/A 07/19/2014   Procedure: LAPAROSCOPIC CHOLECYSTECTOMY WITH INTRAOPERATIVE CHOLANGIOGRAM;  Surgeon: Violeta Gelinas, MD;  Location: [facility] OR;  Service: General;  Laterality: N/A;  . COLONOSCOPY  ?   2011  . KNEE ARTHROSCOPY    . TONSILLECTOMY      There were no vitals filed for this visit.    Subjective Assessment - 12/12/19 1633    Subjective Pt fell on a small step going into the vet 09/26/19.  Pt fell in the same place a while back and broke the left shoulder which now has arthritis.  Currently,    Limitations Lifting    Patient Stated Goals putting hair back; reaching behind the back, putting a button up  shirt on; reaching overhead    Currently in Pain? Yes    Pain Score 1    10/10 at most in the wrong position   Pain Location Shoulder    Pain Orientation Right;Lateral    Pain Descriptors / Indicators Sore;Sharp    Pain Type Acute pain    Pain Onset More than a month ago    Pain Frequency Intermittent    Aggravating Factors  certain movements    Pain Relieving Factors rest, pain medicine    Multiple Pain Sites No              [facility] PT Assessment - 12/16/19 0001      Assessment   Medical Diagnosis S42.291A (ICD-10-CM) - Closed fracture of head of right humerus, initial encounte    Referring Provider (PT) Cammy Copa, MD      Precautions   Precaution Comments 5lb lifting limit      Balance Screen   Has the patient fallen in the past 6 months Yes    How many times? 1      Home Nurse, mental health Private residence    Living Arrangements Children   daughter     Prior Function  Level of Independence Independent    Vocation Retired      CopyCognition   Overall Cognitive Status Within Functional Limits for tasks assessed      Observation/Other Assessments   Focus on Therapeutic Outcomes (FOTO)  48% limited      Posture/Postural Control   Posture/Postural Control No significant limitations      AROM   AROM Assessment Site Shoulder    Right/Left Shoulder Right;Left    Right Shoulder Flexion 120 Degrees    Right Shoulder ABduction 120 Degrees    Right Shoulder Internal Rotation --   to Rt greater trochanter   Right Shoulder External Rotation --   30% +pain   Left Shoulder Flexion 100 Degrees    Left Shoulder ABduction 65 Degrees      Strength   Overall Strength Comments nto tested shoulder due to precautions; grip Rt hand - 53lb; Lt hand 55 lb      Palpation   Palpation comment upper traps and pecs tight                      Objective measurements completed on examination: See above findings.       [facility] Adult PT  Treatment/Exercise - 12/16/19 0001      Self-Care   Self-Care Other Self-Care Comments    Other Self-Care Comments  intial HEP educated and performed - ROM                  PT Education - 12/12/19 1708    Education Details Access Code: [email or web address]    Person(s) Educated Patient    Methods Explanation;Demonstration;Verbal cues;Handout    Comprehension Returned demonstration;Verbalized understanding            PT Short Term Goals - 12/16/19 1419      PT SHORT TERM GOAL #1   Title ind with initial HEP    Time 4    Period Weeks    Status New    Target Date 01/09/20      PT SHORT TERM GOAL #2   Title pt will be able to reach L4 with Rt UE for tucking shirt into pants    Time 4    Period Weeks    Status New    Target Date 01/09/20      PT SHORT TERM GOAL #3   Title pain reduced by 25%    Time 4    Period Weeks    Status New    Target Date 01/09/20      PT SHORT TERM GOAL #4   Title .Marland Kitchen.             PT Long Term Goals - 12/16/19 1422      PT LONG TERM GOAL #1   Title patient able to get dressed by herself due to improved strength and ROM especially snapping her bra in the back    Time 12    Period Weeks    Status New    Target Date 03/05/20      PT LONG TERM GOAL #2   Baseline Can reach overhead 15x in a row holding 1lb for putting away dishes    Time 12    Period Weeks    Status New    Target Date 03/05/20      PT LONG TERM GOAL #3   Title independent with advanced HEP    Time 12    Period Weeks    Status New  Target Date 03/05/20      PT LONG TERM GOAL #4   Title FOTO < or = to 38% limited    Time 16    Period Weeks    Status New    Target Date 03/05/20      PT LONG TERM GOAL #5   Title pain reduced by 80% or more when reaching in all directions    Baseline increases with reaching back and in certain ways    Time 12    Period Weeks    Status New    Target Date 03/05/20                  Plan - 12/16/19 1407    Clinical  Impression Statement Pt presents to skilled PT due to fall on 09/26/19 causing a Rt humerus fracture.  Pt has decreased ROM bilateral shoulders due to history of fracture to the other side.  Pt was not given resistance MMT due to 5lb lifting limite.  She demosntrates reduced grip strength with dominant hand < nondominant.  Pt haspain with shoulder internal and external rotation.  No pain at rest.  Pt would like to be able to work on ROM and strength at home. Pt will benefit from skilled PT to address impairments and guide patient to be able to safely and effectively continue to address impairments with HEP    Personal Factors and Comorbidities Comorbidity 2    Comorbidities history of fx on Lt humerus with stiffness and decreased function; current fx on Rt humerus; O2    Examination-Activity Limitations Carry;Lift;Reach Overhead;Hygiene/Grooming;Dressing;Bathing    Examination-Participation Restrictions Cleaning;Meal Prep    Stability/Clinical Decision Making Stable/Uncomplicated    Clinical Decision Making Low    Rehab Potential Good    PT Frequency 1x / week    PT Duration 12 weeks    PT Treatment/Interventions ADLs/Self Care Home Management;Biofeedback;Cryotherapy;Electrical Stimulation;Moist Heat;Therapeutic activities;Therapeutic exercise;Neuromuscular re-education;Patient/family education;Manual techniques;Passive range of motion;Dry needling;Taping    PT Next Visit Plan f/u on HEP; add shoulder and scap strength (5lb lifting limit); shoulder ROM    PT Home Exercise Plan Access Code: [email or web address]    Consulted and Agree with Plan of Care Patient           Patient will benefit from skilled therapeutic intervention in order to improve the following deficits and impairments:  Pain, Increased fascial restricitons, Decreased strength, Increased muscle spasms, Decreased range of motion  Visit Diagnosis: Acute pain of right shoulder  Stiffness of right shoulder, not elsewhere classified  Muscle  weakness (generalized)     Problem List Patient Active Problem List   Diagnosis Date Noted  . Fracture, humerus, head 09/27/2019  . Morbid (severe) obesity due to excess calories ([facility]) 08/30/2019  . Primary osteoarthritis, left shoulder 08/30/2019  . DM (diabetes mellitus), type 2 ([facility]) 06/22/2018  . GAD (generalized anxiety disorder) 06/22/2018  . Primary osteoarthritis of left knee 03/10/2017  . Trochanteric bursitis of right hip 03/10/2017  . Anterior dislocation of left shoulder 12/20/2016  . Closed fracture of proximal end of left humerus with routine healing 12/20/2016  . Dyspnea on exertion 07/27/2016  . RBBB 03/04/2016  . OSA on CPAP 03/04/2016  . ILD (interstitial lung disease) ([facility])  c/w NSIP p macrodantin exp 02/11/2016  . Morbid obesity due to excess calories ([facility]) 02/11/2016  . Chronic respiratory failure with hypoxia ([facility]) 01/07/2016  . Upper airway cough syndrome 12/06/2015  . COPD GOLD 0 12/05/2015  . Acute cholecystitis 07/19/2014  Junious Silk, PT 12/16/2019, 2:37 PM  [facility] [address] [place], [place], [address] Phone: [phone]   Fax:  [phone]  Name: Priscilla Kirstein MRN: [id number] Date of Birth: 1950/10/31

## 2019-12-16 NOTE — Addendum Note (Signed)
Addended by: Beatris Si on: 12/16/2019 02:39 PM   Modules accepted: Orders

## 2019-12-18 ENCOUNTER — Ambulatory Visit: Payer: Medicare Other | Admitting: Physical Therapy

## 2019-12-18 ENCOUNTER — Other Ambulatory Visit: Payer: Self-pay

## 2019-12-19 ENCOUNTER — Ambulatory Visit: Payer: Medicare Other | Admitting: Orthopedic Surgery

## 2019-12-19 ENCOUNTER — Ambulatory Visit: Payer: Self-pay

## 2019-12-19 DIAGNOSIS — M545 Low back pain, unspecified: Secondary | ICD-10-CM

## 2019-12-19 DIAGNOSIS — M25551 Pain in right hip: Secondary | ICD-10-CM

## 2019-12-20 ENCOUNTER — Encounter: Payer: Self-pay | Admitting: Orthopedic Surgery

## 2019-12-20 DIAGNOSIS — G4733 Obstructive sleep apnea (adult) (pediatric): Secondary | ICD-10-CM | POA: Diagnosis not present

## 2019-12-20 NOTE — Progress Notes (Signed)
Office Visit Note   Patient: Lindsay Brady           Date of Birth: 1951/03/08           MRN: [id number] Visit Date: 12/19/2019 Requested by: Philip Aspen, Limmie Patricia, MD [address] [place],  [place] [address] PCP: Philip Aspen, Limmie Patricia, MD  Subjective: Chief Complaint  Patient presents with   Right Shoulder - Fracture, Follow-up    HPI: Lindsay Brady is a patient here for follow-up right proximal humerus fracture.  She is about 3 months out from that.  Doing well.  Going to physical therapy.  States that the pain and range of motion is improving.  She also reports right hip and buttock pain worse with sitting for the past 3 years.  She has had low back pain since a teenager.  Denies any groin pain but she localizes it discretely to the lateral side in the trochanteric region with radiation distally.  Has some burning with sitting.  Takes Tylenol for pain.  She also takes Percocet twice a day.              ROS: All systems reviewed are negative as they relate to the chief complaint within the history of present illness.  Patient denies  fevers or chills.   Assessment & Plan: Visit Diagnoses:  1. Pain in right hip   2. Right low back pain, unspecified chronicity, unspecified whether sciatica present     Plan: Impression is patient is doing well from right proximal humerus fracture.  Regarding her right hip and buttock pain I think that this could be tendinitis of the gluteus medius and minimus tendons versus trochanteric bursitis versus meralgia paresthetica.  Been going on for 3 years with no improvement.  She has had a home exercise program as well as evaluation in the past with no improvement with nonoperative conservative measures.  Needs MRI of the right hip to evaluate the trochanteric region.  Follow-up after that study  Follow-Up Instructions: Return for after MRI.   Orders:  Orders Placed This Encounter  Procedures   XR Lumbar Spine 2-3 Views   XR HIP  UNILAT W OR W/O PELVIS 2-3 VIEWS RIGHT   MR Hip Right w/o contrast   No orders of the defined types were placed in this encounter.     Procedures: No procedures performed   Clinical Data: No additional findings.  Objective: Vital Signs: There were no vitals taken for this visit.  Physical Exam:   Constitutional: Patient appears well-developed HEENT:  Head: Normocephalic Eyes:EOM are normal Neck: Normal range of motion Cardiovascular: Normal rate Pulmonary/chest: Effort normal Neurologic: Patient is alert Skin: Skin is warm Psychiatric: Patient has normal mood and affect    Ortho Exam: Ortho exam demonstrates normal gait alignment.  She has good abduction adduction and hip flexion strength.  No groin pain with internal or external rotation of the leg.  She has palpable pedal pulses.  No nerve root tension signs.  Good ankle dorsiflexion plantarflexion strength no paresthesias L1 S1 bilaterally.  She does have tenderness over the trochanteric prominence on the right compared to the left.  No SI joint tenderness.  Mild pain with forward lateral bending.  Specialty Comments:  No specialty comments available.  Imaging: XR HIP UNILAT W OR W/O PELVIS 2-3 VIEWS RIGHT  Result Date: 12/20/2019 AP pelvis lateral right hip reviewed.  No acute fracture.  No joint space narrowing or arthritis in the right hip joint.  Mild enthesopathic changes noted at the trochanter.  Sclerosis present at the SI joint region.  Moderate degenerative changes noted in the facet joints of the lower lumbar spine.  XR Lumbar Spine 2-3 Views  Result Date: 12/20/2019 AP lateral lumbar spine reviewed.  Normal lordosis present.  No significant degenerative changes within the intradiscal spaces.  No acute compression fractures or spondylolisthesis.  Only mild facet degenerative changes noted in the lower lumbar spine.    PMFS History: Patient Active Problem List   Diagnosis Date Noted   Fracture,  humerus, head 09/27/2019   Morbid (severe) obesity due to excess calories ([facility]) 08/30/2019   Primary osteoarthritis, left shoulder 08/30/2019   DM (diabetes mellitus), type 2 ([facility]) 06/22/2018   GAD (generalized anxiety disorder) 06/22/2018   Primary osteoarthritis of left knee 03/10/2017   Trochanteric bursitis of right hip 03/10/2017   Anterior dislocation of left shoulder 12/20/2016   Closed fracture of proximal end of left humerus with routine healing 12/20/2016   Dyspnea on exertion 07/27/2016   RBBB 03/04/2016   OSA on CPAP 03/04/2016   ILD (interstitial lung disease) ([facility])  c/w NSIP p macrodantin exp 02/11/2016   Morbid obesity due to excess calories ([facility]) 02/11/2016   Chronic respiratory failure with hypoxia ([facility]) 01/07/2016   Upper airway cough syndrome 12/06/2015   COPD GOLD 0 12/05/2015   Acute cholecystitis 07/19/2014   Past Medical History:  Diagnosis Date   Anxiety    Arthritis    Cholecystitis 07/2014   COPD (chronic obstructive pulmonary disease) ([facility])    Diabetes mellitus without complication ([facility])    type 2    Fracture 2013 ?   left leg   GERD (gastroesophageal reflux disease)    hx of gerd   Headache    Hx: UTI (urinary tract infection)    OA (osteoarthritis) of knee    Pneumonia    hx of viral pna years ago   Shortness of breath dyspnea    Sleep apnea    uses cpap   Urgency of urination     Family History  Problem Relation Age of Onset   Stroke Mother    Lung cancer Father        smoked   Colon cancer Father    Cancer Paternal Grandmother    Cancer Paternal Grandfather     Past Surgical History:  Procedure Laterality Date   ABDOMINAL HYSTERECTOMY     CARPAL TUNNEL RELEASE     CHOLECYSTECTOMY N/A 07/19/2014   Procedure: LAPAROSCOPIC CHOLECYSTECTOMY WITH INTRAOPERATIVE CHOLANGIOGRAM;  Surgeon: Violeta Gelinas, MD;  Location: [facility] OR;  Service: General;  Laterality: N/A;   COLONOSCOPY  ?   2011   KNEE  ARTHROSCOPY     TONSILLECTOMY     Social History   Occupational History   Not on file  Tobacco Use   Smoking status: Former Smoker    Packs/day: 2.00    Years: 30.00    Pack years: 60.00    Types: Cigarettes    Quit date: 05/31/1996    Years since quitting: 23.5   Smokeless tobacco: Never Used  Building services engineer Use: Never used  Substance and Sexual Activity   Alcohol use: No    Alcohol/week: 0.0 standard drinks    Comment: rare   Drug use: No   Sexual activity: Not on file

## 2019-12-23 ENCOUNTER — Other Ambulatory Visit: Payer: Self-pay | Admitting: Internal Medicine

## 2019-12-23 DIAGNOSIS — F419 Anxiety disorder, unspecified: Secondary | ICD-10-CM

## 2019-12-25 ENCOUNTER — Other Ambulatory Visit: Payer: Self-pay

## 2019-12-25 ENCOUNTER — Encounter: Payer: Self-pay | Admitting: Physical Therapy

## 2019-12-25 ENCOUNTER — Ambulatory Visit: Payer: Medicare Other | Admitting: Physical Therapy

## 2019-12-25 DIAGNOSIS — M25511 Pain in right shoulder: Secondary | ICD-10-CM

## 2019-12-25 DIAGNOSIS — M25611 Stiffness of right shoulder, not elsewhere classified: Secondary | ICD-10-CM | POA: Diagnosis not present

## 2019-12-25 DIAGNOSIS — M6281 Muscle weakness (generalized): Secondary | ICD-10-CM | POA: Diagnosis not present

## 2019-12-25 NOTE — Therapy (Signed)
[facility] [address] [place], [place], [address] Phone: [phone]   Fax:  [phone]  Physical Therapy Treatment  Patient Details  Name: Lindsay Brady MRN: [id number] Date of Birth: 11-23-1950 Referring Provider (PT): August Saucer Corrie Mckusick, MD   Encounter Date: 12/25/2019   PT End of Session - 12/25/19 1608    Visit Number 2    Date for PT Re-Evaluation 03/05/20    PT Start Time 1531    PT Stop Time 1611    PT Time Calculation (min) 40 min    Activity Tolerance Patient tolerated treatment well;No increased pain    Behavior During Therapy WFL for tasks assessed/performed           Past Medical History:  Diagnosis Date  . Anxiety   . Arthritis   . Cholecystitis 07/2014  . COPD (chronic obstructive pulmonary disease) (HCC)   . Diabetes mellitus without complication (HCC)    type 2   . Fracture 2013 ?   left leg  . GERD (gastroesophageal reflux disease)    hx of gerd  . Headache   . Hx: UTI (urinary tract infection)   . OA (osteoarthritis) of knee   . Pneumonia    hx of viral pna years ago  . Shortness of breath dyspnea   . Sleep apnea    uses cpap  . Urgency of urination     Past Surgical History:  Procedure Laterality Date  . ABDOMINAL HYSTERECTOMY    . CARPAL TUNNEL RELEASE    . CHOLECYSTECTOMY N/A 07/19/2014   Procedure: LAPAROSCOPIC CHOLECYSTECTOMY WITH INTRAOPERATIVE CHOLANGIOGRAM;  Surgeon: Violeta Gelinas, MD;  Location: [facility] OR;  Service: General;  Laterality: N/A;  . COLONOSCOPY  ?   2011  . KNEE ARTHROSCOPY    . TONSILLECTOMY      There were no vitals filed for this visit.   Subjective Assessment - 12/25/19 1535    Subjective Pt states that things are going well. Not much pain right now. She was not able to do her HEP.    Limitations Lifting    Patient Stated Goals putting hair back; reaching behind the back, putting a button up shirt on; reaching overhead    Currently in  Pain? No/denies    Pain Onset More than a month ago                             [facility] Adult PT Treatment/Exercise - 12/25/19 0001      Exercises   Exercises Shoulder      Shoulder Exercises: Supine   ABduction AAROM;Right;15 reps    Other Supine Exercises BUE serratus press with SPC x15 reps       Shoulder Exercises: Seated   Flexion AAROM;Right;10 reps    Flexion Limitations table slide     Other Seated Exercises Rt UE flexion 2x10 reps #1    Other Seated Exercises shoulder rolls forward/backward x10 reps each       Shoulder Exercises: Standing   Extension AAROM;Both;10 reps    Row Strengthening;Both;10 reps    Row Limitations green TB    Other Standing Exercises Rt reach behind back with cane assist x10 reps                   PT Education - 12/25/19 1607    Education Details reviewed HEP    Person(s) Educated Patient    Methods Explanation;Verbal cues  Comprehension Verbalized understanding;Returned demonstration            PT Short Term Goals - 12/16/19 1419      PT SHORT TERM GOAL #1   Title ind with initial HEP    Time 4    Period Weeks    Status New    Target Date 01/09/20      PT SHORT TERM GOAL #2   Title pt will be able to reach L4 with Rt UE for tucking shirt into pants    Time 4    Period Weeks    Status New    Target Date 01/09/20      PT SHORT TERM GOAL #3   Title pain reduced by 25%    Time 4    Period Weeks    Status New    Target Date 01/09/20      PT SHORT TERM GOAL #4   Title .Marland Kitchen             PT Long Term Goals - 12/16/19 1422      PT LONG TERM GOAL #1   Title patient able to get dressed by herself due to improved strength and ROM especially snapping her bra in the back    Time 12    Period Weeks    Status New    Target Date 03/05/20      PT LONG TERM GOAL #2   Baseline Can reach overhead 15x in a row holding 1lb for putting away dishes    Time 12    Period Weeks    Status New    Target Date  03/05/20      PT LONG TERM GOAL #3   Title independent with advanced HEP    Time 12    Period Weeks    Status New    Target Date 03/05/20      PT LONG TERM GOAL #4   Title FOTO < or = to 38% limited    Time 16    Period Weeks    Status New    Target Date 03/05/20      PT LONG TERM GOAL #5   Title pain reduced by 80% or more when reaching in all directions    Baseline increases with reaching back and in certain ways    Time 12    Period Weeks    Status New    Target Date 03/05/20                 Plan - 12/25/19 1610    Clinical Impression Statement PT reviewed pt's HEP today to ensure greater understanding with this moving forward. Session focused on therex to promote active reach behind back and overhead. PT made some adjustments to exercises secondary to Lt shoulder stiffness/pain. Pt felt good following today's session. Will continue with current POC.    Personal Factors and Comorbidities Comorbidity 2    Comorbidities history of fx on Lt humerus with stiffness and decreased function; current fx on Rt humerus; O2    Examination-Activity Limitations Carry;Lift;Reach Overhead;Hygiene/Grooming;Dressing;Bathing    Examination-Participation Restrictions Cleaning;Meal Prep    Stability/Clinical Decision Making Stable/Uncomplicated    Rehab Potential Good    PT Frequency 1x / week    PT Duration 12 weeks    PT Treatment/Interventions ADLs/Self Care Home Management;Biofeedback;Cryotherapy;Electrical Stimulation;Moist Heat;Therapeutic activities;Therapeutic exercise;Neuromuscular re-education;Patient/family education;Manual techniques;Passive range of motion;Dry needling;Taping    PT Next Visit Plan progress shoulder and scap strength (5lb lifting limit); shoulder ROM  PT Home Exercise Plan Access Code: [email or web address]    Consulted and Agree with Plan of Care Patient           Patient will benefit from skilled therapeutic intervention in order to improve the following deficits  and impairments:  Pain, Increased fascial restricitons, Decreased strength, Increased muscle spasms, Decreased range of motion  Visit Diagnosis: Acute pain of right shoulder  Stiffness of right shoulder, not elsewhere classified  Muscle weakness (generalized)     Problem List Patient Active Problem List   Diagnosis Date Noted  . Fracture, humerus, head 09/27/2019  . Morbid (severe) obesity due to excess calories (HCC) 08/30/2019  . Primary osteoarthritis, left shoulder 08/30/2019  . DM (diabetes mellitus), type 2 (HCC) 06/22/2018  . GAD (generalized anxiety disorder) 06/22/2018  . Primary osteoarthritis of left knee 03/10/2017  . Trochanteric bursitis of right hip 03/10/2017  . Anterior dislocation of left shoulder 12/20/2016  . Closed fracture of proximal end of left humerus with routine healing 12/20/2016  . Dyspnea on exertion 07/27/2016  . RBBB 03/04/2016  . OSA on CPAP 03/04/2016  . ILD (interstitial lung disease) (HCC)  c/w NSIP p macrodantin exp 02/11/2016  . Morbid obesity due to excess calories (HCC) 02/11/2016  . Chronic respiratory failure with hypoxia (HCC) 01/07/2016  . Upper airway cough syndrome 12/06/2015  . COPD GOLD 0 12/05/2015  . Acute cholecystitis 07/19/2014   4:16 PM,12/25/19 Donita Brooks PT, DPT [facility]  [phone]  [facility] [address] [place], [place], [address] Phone: [phone]   Fax:  [phone]  Name: Maggi Hershkowitz MRN: [id number] Date of Birth: 03/07/1951

## 2020-01-01 ENCOUNTER — Telehealth: Payer: Self-pay | Admitting: Internal Medicine

## 2020-01-01 ENCOUNTER — Ambulatory Visit: Payer: Medicare Other | Attending: Orthopedic Surgery | Admitting: Physical Therapy

## 2020-01-01 ENCOUNTER — Other Ambulatory Visit: Payer: Self-pay

## 2020-01-01 ENCOUNTER — Encounter: Payer: Self-pay | Admitting: Physical Therapy

## 2020-01-01 DIAGNOSIS — S81812A Laceration without foreign body, left lower leg, initial encounter: Secondary | ICD-10-CM | POA: Diagnosis not present

## 2020-01-01 DIAGNOSIS — M6281 Muscle weakness (generalized): Secondary | ICD-10-CM | POA: Diagnosis not present

## 2020-01-01 DIAGNOSIS — M25511 Pain in right shoulder: Secondary | ICD-10-CM | POA: Diagnosis not present

## 2020-01-01 DIAGNOSIS — M25611 Stiffness of right shoulder, not elsewhere classified: Secondary | ICD-10-CM | POA: Diagnosis not present

## 2020-01-01 NOTE — Therapy (Signed)
[facility] [address] [place], [place], [address] Phone: [phone]   Fax:  [phone]  Physical Therapy Treatment  Patient Details  Name: Lindsay Brady MRN: [id number] Date of Birth: Mar 11, 1951 Referring Provider (PT): August Saucer Corrie Mckusick, MD   Encounter Date: 01/01/2020   PT End of Session - 01/01/20 1527    Visit Number 3    Date for PT Re-Evaluation 03/05/20    PT Start Time 1446    PT Stop Time 1527    PT Time Calculation (min) 41 min    Activity Tolerance Patient tolerated treatment well;No increased pain    Behavior During Therapy WFL for tasks assessed/performed           Past Medical History:  Diagnosis Date  . Anxiety   . Arthritis   . Cholecystitis 07/2014  . COPD (chronic obstructive pulmonary disease) (HCC)   . Diabetes mellitus without complication (HCC)    type 2   . Fracture 2013 ?   left leg  . GERD (gastroesophageal reflux disease)    hx of gerd  . Headache   . Hx: UTI (urinary tract infection)   . OA (osteoarthritis) of knee   . Pneumonia    hx of viral pna years ago  . Shortness of breath dyspnea   . Sleep apnea    uses cpap  . Urgency of urination     Past Surgical History:  Procedure Laterality Date  . ABDOMINAL HYSTERECTOMY    . CARPAL TUNNEL RELEASE    . CHOLECYSTECTOMY N/A 07/19/2014   Procedure: LAPAROSCOPIC CHOLECYSTECTOMY WITH INTRAOPERATIVE CHOLANGIOGRAM;  Surgeon: Violeta Gelinas, MD;  Location: [facility] OR;  Service: General;  Laterality: N/A;  . COLONOSCOPY  ?   2011  . KNEE ARTHROSCOPY    . TONSILLECTOMY      There were no vitals filed for this visit.   Subjective Assessment - 01/01/20 1502    Subjective Pt did not work specifically on her HEP but she used the arm alot cleaning over the weekend. She has no issues with this.    Limitations Lifting    Patient Stated Goals putting hair back; reaching behind the back, putting a button up shirt on; reaching  overhead    Currently in Pain? No/denies    Pain Onset More than a month ago                             [facility] Adult PT Treatment/Exercise - 01/01/20 0001      Shoulder Exercises: Seated   Flexion Strengthening;Right;10 reps    Flexion Weight (lbs) 1    Flexion Limitations x2 sets       Shoulder Exercises: Standing   External Rotation Strengthening;Right;10 reps    Theraband Level (Shoulder External Rotation) Level 1 (Yellow)    External Rotation Limitations pt compensation with elbow extension    Internal Rotation Strengthening;Right;10 reps    Theraband Level (Shoulder Internal Rotation) Level 1 (Yellow)    Extension AAROM;Left;Right   holding SPC x3 min   Extension Limitations BUE extension with yellow TB 2x10 reps     Row Strengthening;Both;10 reps    Row Limitations x2 sets blue TB    Other Standing Exercises Rt reach behind back with cane assist x10 reps; elbow flexion #3: x10 reps full, partial range    Other Standing Exercises Rt shoulder ER with PT manual resistance through motion for improved posterior shoulder  contraction                  PT Education - 01/01/20 1527    Education Details updated HEP    Person(s) Educated Patient    Methods Explanation;Handout;Verbal cues    Comprehension Verbalized understanding;Returned demonstration            PT Short Term Goals - 12/16/19 1419      PT SHORT TERM GOAL #1   Title ind with initial HEP    Time 4    Period Weeks    Status New    Target Date 01/09/20      PT SHORT TERM GOAL #2   Title pt will be able to reach L4 with Rt UE for tucking shirt into pants    Time 4    Period Weeks    Status New    Target Date 01/09/20      PT SHORT TERM GOAL #3   Title pain reduced by 25%    Time 4    Period Weeks    Status New    Target Date 01/09/20      PT SHORT TERM GOAL #4   Title .Marland Kitchen             PT Long Term Goals - 12/16/19 1422      PT LONG TERM GOAL #1   Title patient able  to get dressed by herself due to improved strength and ROM especially snapping her bra in the back    Time 12    Period Weeks    Status New    Target Date 03/05/20      PT LONG TERM GOAL #2   Baseline Can reach overhead 15x in a row holding 1lb for putting away dishes    Time 12    Period Weeks    Status New    Target Date 03/05/20      PT LONG TERM GOAL #3   Title independent with advanced HEP    Time 12    Period Weeks    Status New    Target Date 03/05/20      PT LONG TERM GOAL #4   Title FOTO < or = to 38% limited    Time 16    Period Weeks    Status New    Target Date 03/05/20      PT LONG TERM GOAL #5   Title pain reduced by 80% or more when reaching in all directions    Baseline increases with reaching back and in certain ways    Time 12    Period Weeks    Status New    Target Date 03/05/20                 Plan - 01/01/20 1528    Clinical Impression Statement Pt denied pain upon arrival. She has not been working on her HEP regularly, but she was able to complete a lot of cleaning over the weekend without exacerbation of pain. Focused on therex to improve Rt shoulder strength. Pt was able to complete gentle resistance exercises without significant compensation. She required PT cuing to avoid elbow extension during external rotation. She would benefit from further work on this in future sessions. PT updated pt's HEP and encouraged increase in adherence moving forward.    Personal Factors and Comorbidities Comorbidity 2    Comorbidities history of fx on Lt humerus with stiffness and decreased function; current fx on Rt  humerus; O2    Examination-Activity Limitations Carry;Lift;Reach Overhead;Hygiene/Grooming;Dressing;Bathing    Examination-Participation Restrictions Cleaning;Meal Prep    Stability/Clinical Decision Making Stable/Uncomplicated    Rehab Potential Good    PT Frequency 1x / week    PT Duration 12 weeks    PT Treatment/Interventions ADLs/Self Care  Home Management;Biofeedback;Cryotherapy;Electrical Stimulation;Moist Heat;Therapeutic activities;Therapeutic exercise;Neuromuscular re-education;Patient/family education;Manual techniques;Passive range of motion;Dry needling;Taping    PT Next Visit Plan progress shoulder and scap strength (5lb lifting limit); shoulder ROM    PT Home Exercise Plan Access Code: [email or web address]    Consulted and Agree with Plan of Care Patient           Patient will benefit from skilled therapeutic intervention in order to improve the following deficits and impairments:  Pain, Increased fascial restricitons, Decreased strength, Increased muscle spasms, Decreased range of motion  Visit Diagnosis: Acute pain of right shoulder  Stiffness of right shoulder, not elsewhere classified  Muscle weakness (generalized)     Problem List Patient Active Problem List   Diagnosis Date Noted  . Fracture, humerus, head 09/27/2019  . Morbid (severe) obesity due to excess calories (HCC) 08/30/2019  . Primary osteoarthritis, left shoulder 08/30/2019  . DM (diabetes mellitus), type 2 (HCC) 06/22/2018  . GAD (generalized anxiety disorder) 06/22/2018  . Primary osteoarthritis of left knee 03/10/2017  . Trochanteric bursitis of right hip 03/10/2017  . Anterior dislocation of left shoulder 12/20/2016  . Closed fracture of proximal end of left humerus with routine healing 12/20/2016  . Dyspnea on exertion 07/27/2016  . RBBB 03/04/2016  . OSA on CPAP 03/04/2016  . ILD (interstitial lung disease) (HCC)  c/w NSIP p macrodantin exp 02/11/2016  . Morbid obesity due to excess calories (HCC) 02/11/2016  . Chronic respiratory failure with hypoxia (HCC) 01/07/2016  . Upper airway cough syndrome 12/06/2015  . COPD GOLD 0 12/05/2015  . Acute cholecystitis 07/19/2014   3:45 PM,01/01/20 Donita Brooks PT, DPT [facility]  [phone]  [facility] [address] [place], [place], [address] Phone: [phone]   Fax:  [phone]  Name: Lindsay Brady MRN: [id number] Date of Birth: 1950/08/25

## 2020-01-01 NOTE — Patient Instructions (Signed)
Access Code: [email or web address] URL: [email or web address] Date: 01/01/2020 Prepared by: [facility]  Exercises .Seated Single Arm Shoulder Flexion with Dumbbells - 1 x daily - 7 x weekly - 2 sets - 10 reps - 1lb hold .Seated Bilateral Shoulder Scaption with Dumbbells - 1 x daily - 7 x weekly - 2 sets - 10 reps - 1lb hold .Single Arm Shoulder Extension with Anchored Resistance - 1 x daily - 7 x weekly - 3 sets - 10 reps .Standing Row with Anchored Resistance - 1 x daily - 7 x weekly - 3 sets - 10 reps    [facility] [address] [place], [place] [address] Phone # [phone] Fax [phone]

## 2020-01-01 NOTE — Telephone Encounter (Signed)
Last tetanus was given 2013.  Next tetanus due 2023. Left message on machine for patient to return our call.

## 2020-01-01 NOTE — Telephone Encounter (Signed)
Patient spoke with Triage nurse and let them know that she recently hurt her leg on a metal bed frame and now has a "V" incision on her leg. It bleed a lot and constant aches a little.   She was wondering if she needs to get a tetanus shot.  Please advise

## 2020-01-03 DIAGNOSIS — J449 Chronic obstructive pulmonary disease, unspecified: Secondary | ICD-10-CM | POA: Diagnosis not present

## 2020-01-03 DIAGNOSIS — R062 Wheezing: Secondary | ICD-10-CM | POA: Diagnosis not present

## 2020-01-14 DIAGNOSIS — R0989 Other specified symptoms and signs involving the circulatory and respiratory systems: Secondary | ICD-10-CM | POA: Diagnosis not present

## 2020-01-14 DIAGNOSIS — Z79899 Other long term (current) drug therapy: Secondary | ICD-10-CM | POA: Diagnosis not present

## 2020-01-14 DIAGNOSIS — E119 Type 2 diabetes mellitus without complications: Secondary | ICD-10-CM | POA: Diagnosis not present

## 2020-01-14 DIAGNOSIS — Z20822 Contact with and (suspected) exposure to covid-19: Secondary | ICD-10-CM | POA: Diagnosis not present

## 2020-01-14 DIAGNOSIS — M25512 Pain in left shoulder: Secondary | ICD-10-CM | POA: Diagnosis not present

## 2020-01-14 DIAGNOSIS — Z1159 Encounter for screening for other viral diseases: Secondary | ICD-10-CM | POA: Diagnosis not present

## 2020-01-21 ENCOUNTER — Ambulatory Visit: Payer: Medicare Other | Admitting: Physical Therapy

## 2020-01-21 ENCOUNTER — Encounter: Payer: Self-pay | Admitting: Physical Therapy

## 2020-01-21 ENCOUNTER — Other Ambulatory Visit: Payer: Self-pay

## 2020-01-21 DIAGNOSIS — M6281 Muscle weakness (generalized): Secondary | ICD-10-CM

## 2020-01-21 DIAGNOSIS — M25511 Pain in right shoulder: Secondary | ICD-10-CM | POA: Diagnosis not present

## 2020-01-21 DIAGNOSIS — M25611 Stiffness of right shoulder, not elsewhere classified: Secondary | ICD-10-CM

## 2020-01-21 NOTE — Therapy (Addendum)
[facility] [address] [place], [place], [address] Phone: [phone]   Fax:  [phone]  Physical Therapy Treatment  Patient Details  Name: Lindsay Brady MRN: [id number] Date of Birth: 1951/02/09 Referring Provider (PT): August Saucer Corrie Mckusick, MD   Encounter Date: 01/21/2020   PT End of Session - 01/21/20 1545    Visit Number 4    Date for PT Re-Evaluation 03/05/20    PT Start Time 1530    PT Stop Time 1612    PT Time Calculation (min) 42 min    Activity Tolerance Patient tolerated treatment well;No increased pain    Behavior During Therapy WFL for tasks assessed/performed           Past Medical History:  Diagnosis Date  . Anxiety   . Arthritis   . Cholecystitis 07/2014  . COPD (chronic obstructive pulmonary disease) (HCC)   . Diabetes mellitus without complication (HCC)    type 2   . Fracture 2013 ?   left leg  . GERD (gastroesophageal reflux disease)    hx of gerd  . Headache   . Hx: UTI (urinary tract infection)   . OA (osteoarthritis) of knee   . Pneumonia    hx of viral pna years ago  . Shortness of breath dyspnea   . Sleep apnea    uses cpap  . Urgency of urination     Past Surgical History:  Procedure Laterality Date  . ABDOMINAL HYSTERECTOMY    . CARPAL TUNNEL RELEASE    . CHOLECYSTECTOMY N/A 07/19/2014   Procedure: LAPAROSCOPIC CHOLECYSTECTOMY WITH INTRAOPERATIVE CHOLANGIOGRAM;  Surgeon: Violeta Gelinas, MD;  Location: [facility] OR;  Service: General;  Laterality: N/A;  . COLONOSCOPY  ?   2011  . KNEE ARTHROSCOPY    . TONSILLECTOMY      There were no vitals filed for this visit.   Subjective Assessment - 01/21/20 1538    Subjective Pt states she started pain with normal things like reaching for a glass.    Limitations Lifting    Patient Stated Goals putting hair back; reaching behind the back, putting a button up shirt on; reaching overhead    Currently in Pain? Yes    Pain Score  1     Pain Location Shoulder    Pain Orientation Right;Left    Pain Descriptors / Indicators Sore    Pain Type Acute pain    Pain Onset More than a month ago    Multiple Pain Sites No                             [facility] Adult PT Treatment/Exercise - 01/24/20 0001      Shoulder Exercises: Standing   External Rotation Strengthening;Right;10 reps    Theraband Level (Shoulder External Rotation) Level 1 (Yellow)    Internal Rotation Strengthening;Right;10 reps    Theraband Level (Shoulder Internal Rotation) Level 1 (Yellow)    Other Standing Exercises horizontal abdution with ranger in sitting    Other Standing Exercises flexion with ranger on stairs - 20x      Shoulder Exercises: Pulleys   Flexion 2 minutes      Shoulder Exercises: ROM/Strengthening   Ball on Wall wall slides      Manual Therapy   Manual Therapy Soft tissue mobilization;Joint mobilization    Joint Mobilization AP mobs Lt shoulder    Soft tissue mobilization anterior deltoid and pecs  PT Short Term Goals - 01/21/20 1549      PT SHORT TERM GOAL #1   Title ind with initial HEP    Status Achieved             PT Long Term Goals - 12/16/19 1422      PT LONG TERM GOAL #1   Title patient able to get dressed by herself due to improved strength and ROM especially snapping her bra in the back    Time 12    Period Weeks    Status New    Target Date 03/05/20      PT LONG TERM GOAL #2   Baseline Can reach overhead 15x in a row holding 1lb for putting away dishes    Time 12    Period Weeks    Status New    Target Date 03/05/20      PT LONG TERM GOAL #3   Title independent with advanced HEP    Time 12    Period Weeks    Status New    Target Date 03/05/20      PT LONG TERM GOAL #4   Title FOTO < or = to 38% limited    Time 16    Period Weeks    Status New    Target Date 03/05/20      PT LONG TERM GOAL #5   Title pain reduced by 80% or more when reaching  in all directions    Baseline increases with reaching back and in certain ways    Time 12    Period Weeks    Status New    Target Date 03/05/20                 Plan - 01/22/20 0758    Clinical Impression Statement Pt reports having more pain.  She also reports not doing the strengthening as much.  Pt had tension especially pecs and anterior deltoids and responded well to STM.  Pt able to do exercises today but did not progress resistance due to increased pain overall.  Pt recommended to continue with strengthening for improved functional activities.    PT Treatment/Interventions ADLs/Self Care Home Management;Biofeedback;Cryotherapy;Electrical Stimulation;Moist Heat;Therapeutic activities;Therapeutic exercise;Neuromuscular re-education;Patient/family education;Manual techniques;Passive range of motion;Dry needling;Taping    PT Next Visit Plan progress shoulder and scap strength (5lb lifting limit); shoulder ROM    PT Home Exercise Plan Access Code: [email or web address]    Consulted and Agree with Plan of Care Patient           Patient will benefit from skilled therapeutic intervention in order to improve the following deficits and impairments:  Pain, Increased fascial restricitons, Decreased strength, Increased muscle spasms, Decreased range of motion  Visit Diagnosis: Acute pain of right shoulder  Stiffness of right shoulder, not elsewhere classified  Muscle weakness (generalized)     Problem List Patient Active Problem List   Diagnosis Date Noted  . Fracture, humerus, head 09/27/2019  . Morbid (severe) obesity due to excess calories (HCC) 08/30/2019  . Primary osteoarthritis, left shoulder 08/30/2019  . DM (diabetes mellitus), type 2 (HCC) 06/22/2018  . GAD (generalized anxiety disorder) 06/22/2018  . Primary osteoarthritis of left knee 03/10/2017  . Trochanteric bursitis of right hip 03/10/2017  . Anterior dislocation of left shoulder 12/20/2016  . Closed fracture of proximal  end of left humerus with routine healing 12/20/2016  . Dyspnea on exertion 07/27/2016  . RBBB 03/04/2016  . OSA on CPAP 03/04/2016  . ILD (  interstitial lung disease) (HCC)  c/w NSIP p macrodantin exp 02/11/2016  . Morbid obesity due to excess calories (HCC) 02/11/2016  . Chronic respiratory failure with hypoxia (HCC) 01/07/2016  . Upper airway cough syndrome 12/06/2015  . COPD GOLD 0 12/05/2015  . Acute cholecystitis 07/19/2014    Junious Silk, PT 01/24/2020, 8:37 AM  [facility] [address] [place], [place], [address] Phone: [phone]   Fax:  [phone]  Name: Casidy [place] MRN: [id number] Date of Birth: 27-Apr-1951

## 2020-01-24 ENCOUNTER — Ambulatory Visit
Admission: RE | Admit: 2020-01-24 | Discharge: 2020-01-24 | Disposition: A | Discharge status: Home / Self Care | Payer: Medicare Other | Source: Ambulatory Visit | Attending: Orthopedic Surgery | Admitting: Orthopedic Surgery

## 2020-01-24 ENCOUNTER — Other Ambulatory Visit: Payer: Self-pay

## 2020-01-24 DIAGNOSIS — M25551 Pain in right hip: Secondary | ICD-10-CM | POA: Diagnosis not present

## 2020-01-29 ENCOUNTER — Other Ambulatory Visit: Payer: Self-pay

## 2020-01-29 ENCOUNTER — Ambulatory Visit: Payer: Medicare Other | Admitting: Physical Therapy

## 2020-01-29 ENCOUNTER — Encounter: Payer: Self-pay | Admitting: Physical Therapy

## 2020-01-29 DIAGNOSIS — M6281 Muscle weakness (generalized): Secondary | ICD-10-CM | POA: Diagnosis not present

## 2020-01-29 DIAGNOSIS — M25611 Stiffness of right shoulder, not elsewhere classified: Secondary | ICD-10-CM | POA: Diagnosis not present

## 2020-01-29 DIAGNOSIS — M25511 Pain in right shoulder: Secondary | ICD-10-CM

## 2020-01-29 NOTE — Therapy (Signed)
[facility] [address], [address] [place], [place], [address] Phone: [phone]   Fax:  [phone]  Physical Therapy Treatment  Patient Details  Name: Lindsay Brady MRN: [id number] Date of Birth: 18-Aug-1950 Referring Provider (PT): August Saucer Corrie Mckusick, MD   Encounter Date: 01/29/2020   PT End of Session - 01/29/20 1413    Visit Number 5    Date for PT Re-Evaluation 03/05/20    PT Start Time 1403    PT Stop Time 1445    PT Time Calculation (min) 42 min    Activity Tolerance Patient tolerated treatment well;No increased pain    Behavior During Therapy WFL for tasks assessed/performed           Past Medical History:  Diagnosis Date  . Anxiety   . Arthritis   . Cholecystitis 07/2014  . COPD (chronic obstructive pulmonary disease) (HCC)   . Diabetes mellitus without complication (HCC)    type 2   . Fracture 2013 ?   left leg  . GERD (gastroesophageal reflux disease)    hx of gerd  . Headache   . Hx: UTI (urinary tract infection)   . OA (osteoarthritis) of knee   . Pneumonia    hx of viral pna years ago  . Shortness of breath dyspnea   . Sleep apnea    uses cpap  . Urgency of urination     Past Surgical History:  Procedure Laterality Date  . ABDOMINAL HYSTERECTOMY    . CARPAL TUNNEL RELEASE    . CHOLECYSTECTOMY N/A 07/19/2014   Procedure: LAPAROSCOPIC CHOLECYSTECTOMY WITH INTRAOPERATIVE CHOLANGIOGRAM;  Surgeon: Violeta Gelinas, MD;  Location: [facility];  Service: General;  Laterality: N/A;  . COLONOSCOPY  ?   2011  . KNEE ARTHROSCOPY    . TONSILLECTOMY      There were no vitals filed for this visit.   Subjective Assessment - 01/29/20 1409    Subjective Pt states things are going well. She only has pain when reaching back. No pain currently.    Limitations Lifting    Patient Stated Goals putting hair back; reaching behind the back, putting a button up shirt on; reaching overhead    Currently in Pain?  No/denies    Pain Onset More than a month ago                             [facility] Adult PT Treatment/Exercise - 01/29/20 0001      Shoulder Exercises: Supine   Diagonals Strengthening;Right;10 reps    Theraband Level (Shoulder Diagonals) Level 1 (Yellow)    Diagonals Limitations D2 flexion    Other Supine Exercises horizontal abduction yellow TB 2x10 reps     Other Supine Exercises holding SPC at 90 deg elevation: PT perturbations 2x20 sec       Shoulder Exercises: Seated   Flexion Strengthening;Right;10 reps;Left    Flexion Weight (lbs) 1    Flexion Limitations x2 sets    Other Seated Exercises BUE scaption #1 2x10 reps       Shoulder Exercises: Standing   External Rotation Strengthening;Right;10 reps    Theraband Level (Shoulder External Rotation) Level 1 (Yellow)    External Rotation Limitations x2 sets    Internal Rotation Strengthening;Right;10 reps    Theraband Level (Shoulder Internal Rotation) Level 1 (Yellow)    Row Strengthening;Both;10 reps    Theraband Level (Shoulder Row) Level 3 (Green)    Other  Standing Exercises Rt UE ranger on wall L31 flexion and scaption x15 reps each       Shoulder Exercises: Pulleys   Flexion 2 minutes   Rt   Scaption 2 minutes   Rt                 PT Education - 01/29/20 1700    Education Details technique with therex    Person(s) Educated Patient    Methods Explanation;Verbal cues    Comprehension Verbalized understanding;Returned demonstration            PT Short Term Goals - 01/21/20 1549      PT SHORT TERM GOAL #1   Title ind with initial HEP    Status Achieved             PT Long Term Goals - 12/16/19 1422      PT LONG TERM GOAL #1   Title patient able to get dressed by herself due to improved strength and ROM especially snapping her bra in the back    Time 12    Period Weeks    Status New    Target Date 03/05/20      PT LONG TERM GOAL #2   Baseline Can reach overhead 15x in a row  holding 1lb for putting away dishes    Time 12    Period Weeks    Status New    Target Date 03/05/20      PT LONG TERM GOAL #3   Title independent with advanced HEP    Time 12    Period Weeks    Status New    Target Date 03/05/20      PT LONG TERM GOAL #4   Title FOTO < or = to 38% limited    Time 16    Period Weeks    Status New    Target Date 03/05/20      PT LONG TERM GOAL #5   Title pain reduced by 80% or more when reaching in all directions    Baseline increases with reaching back and in certain ways    Time 12    Period Weeks    Status New    Target Date 03/05/20                 Plan - 01/29/20 1701    Clinical Impression Statement Pt is making progress towards her goals and has improved UE use throughout the day. Pt was able to increase sets with resisted shoulder flexion and scaption without any pain or shoulder shrug compensation. Pt required PT verbal and tactile cuing to improve posterior shoulder activation during resisted shoulder external rotation. Will likely make more updates to pt's HEP at her next visit assuming no issues/concerns arise.    PT Treatment/Interventions ADLs/Self Care Home Management;Biofeedback;Cryotherapy;Electrical Stimulation;Moist Heat;Therapeutic activities;Therapeutic exercise;Neuromuscular re-education;Patient/family education;Manual techniques;Passive range of motion;Dry needling;Taping    PT Next Visit Plan progress shoulder and scap strength (5lb lifting limit); shoulder ROM and update HEP with TB    PT Home Exercise Plan Access Code: [email or web address]    Consulted and Agree with Plan of Care Patient           Patient will benefit from skilled therapeutic intervention in order to improve the following deficits and impairments:  Pain, Increased fascial restricitons, Decreased strength, Increased muscle spasms, Decreased range of motion  Visit Diagnosis: Acute pain of right shoulder  Stiffness of right shoulder, not elsewhere  classified  Muscle weakness (  generalized)     Problem List Patient Active Problem List   Diagnosis Date Noted  . Fracture, humerus, head 09/27/2019  . Morbid (severe) obesity due to excess calories (HCC) 08/30/2019  . Primary osteoarthritis, left shoulder 08/30/2019  . DM (diabetes mellitus), type 2 (HCC) 06/22/2018  . GAD (generalized anxiety disorder) 06/22/2018  . Primary osteoarthritis of left knee 03/10/2017  . Trochanteric bursitis of right hip 03/10/2017  . Anterior dislocation of left shoulder 12/20/2016  . Closed fracture of proximal end of left humerus with routine healing 12/20/2016  . Dyspnea on exertion 07/27/2016  . RBBB 03/04/2016  . OSA on CPAP 03/04/2016  . ILD (interstitial lung disease) (HCC)  c/w NSIP p macrodantin exp 02/11/2016  . Morbid obesity due to excess calories (HCC) 02/11/2016  . Chronic respiratory failure with hypoxia (HCC) 01/07/2016  . Upper airway cough syndrome 12/06/2015  . COPD GOLD 0 12/05/2015  . Acute cholecystitis 07/19/2014   5:08 PM,01/29/20 Donita Brooks PT, DPT [facility]  [phone]  [facility] [address] [place], [place], [address] Phone: [phone]   Fax:  [phone]  Name: Lindsay Brady MRN: [id number] Date of Birth: 09/07/1950

## 2020-02-03 DIAGNOSIS — J449 Chronic obstructive pulmonary disease, unspecified: Secondary | ICD-10-CM | POA: Diagnosis not present

## 2020-02-03 DIAGNOSIS — R062 Wheezing: Secondary | ICD-10-CM | POA: Diagnosis not present

## 2020-02-06 ENCOUNTER — Ambulatory Visit: Payer: Medicare Other | Admitting: Orthopedic Surgery

## 2020-02-06 DIAGNOSIS — M25551 Pain in right hip: Secondary | ICD-10-CM | POA: Diagnosis not present

## 2020-02-07 ENCOUNTER — Ambulatory Visit: Payer: Medicare Other | Attending: Orthopedic Surgery | Admitting: Physical Therapy

## 2020-02-07 ENCOUNTER — Encounter: Payer: Self-pay | Admitting: Physical Therapy

## 2020-02-07 ENCOUNTER — Other Ambulatory Visit: Payer: Self-pay

## 2020-02-07 DIAGNOSIS — M25511 Pain in right shoulder: Secondary | ICD-10-CM | POA: Diagnosis not present

## 2020-02-07 DIAGNOSIS — M25611 Stiffness of right shoulder, not elsewhere classified: Secondary | ICD-10-CM | POA: Diagnosis not present

## 2020-02-07 DIAGNOSIS — M6281 Muscle weakness (generalized): Secondary | ICD-10-CM | POA: Insufficient documentation

## 2020-02-07 NOTE — Patient Instructions (Signed)
Access Code: [email or web address]: [email or web address]: 09/09/2021Prepared by: [facility]  Single Arm Shoulder Extension with Anchored Resistance - 1 x daily - 7 x weekly - 3 sets - 10 reps  Standing Row with Anchored Resistance - 1 x daily - 7 x weekly - 3 sets - 10 reps  Standing Shoulder Flexion with Resistance - 1 x daily - 7 x weekly - 1-2 sets - 15 reps  Standing Shoulder Scaption with Resistance - 1 x daily - 7 x weekly - 1-2 sets - 15 reps  Standing Single Arm Shoulder Abduction with Resistance - 1 x daily - 7 x weekly - 1-2 sets - 15 reps  [facility] [address], [address] [place], [place] [address] Phone # [phone] Fax [phone]

## 2020-02-07 NOTE — Therapy (Signed)
[facility] [address] [place], [place], [address] Phone: [phone]   Fax:  [phone]  Physical Therapy Treatment  Patient Details  Name: Lindsay Brady MRN: [id number] Date of Birth: May 09, 1951 Referring Provider (PT): Marlou Sa Tonna Corner, MD   Encounter Date: 02/07/2020   PT End of Session - 02/07/20 1408    Visit Number 6    Date for PT Re-Evaluation 03/05/20    PT Start Time 1401    PT Stop Time 3833    PT Time Calculation (min) 41 min    Activity Tolerance Patient tolerated treatment well;No increased pain    Behavior During Therapy WFL for tasks assessed/performed           Past Medical History:  Diagnosis Date  . Anxiety   . Arthritis   . Cholecystitis 07/2014  . COPD (chronic obstructive pulmonary disease) ([place])   . Diabetes mellitus without complication ([place])    type 2   . Fracture 2013 ?   left leg  . GERD (gastroesophageal reflux disease)    hx of gerd  . Headache   . Hx: UTI (urinary tract infection)   . OA (osteoarthritis) of knee   . Pneumonia    hx of viral pna years ago  . Shortness of breath dyspnea   . Sleep apnea    uses cpap  . Urgency of urination     Past Surgical History:  Procedure Laterality Date  . ABDOMINAL HYSTERECTOMY    . CARPAL TUNNEL RELEASE    . CHOLECYSTECTOMY N/A 07/19/2014   Procedure: LAPAROSCOPIC CHOLECYSTECTOMY WITH INTRAOPERATIVE CHOLANGIOGRAM;  Surgeon: Georganna Skeans, MD;  Location: [place];  Service: General;  Laterality: N/A;  . COLONOSCOPY  ?   2011  . KNEE ARTHROSCOPY    . TONSILLECTOMY      There were no vitals filed for this visit.   Subjective Assessment - 02/07/20 1444    Subjective Pt has no complaints at this time.    Limitations Lifting    Patient Stated Goals putting hair back; reaching behind the back, putting a button up shirt on; reaching overhead    Currently in Pain? No/denies    Pain Onset More than a month ago                              [facility] Adult PT Treatment/Exercise - 02/07/20 0001      Shoulder Exercises: Standing   Flexion Right;Strengthening;15 reps    Theraband Level (Shoulder Flexion) Level 1 (Yellow)    ABduction Strengthening;Right;15 reps    Theraband Level (Shoulder ABduction) Level 1 (Yellow)    ABduction Limitations x15 reps in scapular plane- yellow TB     Other Standing Exercises Rt bicep curl 3x10 reps     Other Standing Exercises B tricep extension green TB 2x10 reps       Shoulder Exercises: ROM/Strengthening   Wall Pushups 10 reps    Wall Pushups Limitations x2 sets     Ball on Wall Rt UE clockwise/counterclockwise 2x20 sec     Other ROM/Strengthening Exercises rhythmic stabilization Rt UE ball on wall 2x20 sec                   PT Education - 02/07/20 1442    Education Details updated HEP    Person(s) Educated Patient    Methods Explanation;Verbal cues;Handout    Comprehension Verbalized understanding;Returned demonstration  PT Short Term Goals - 02/07/20 1420      PT SHORT TERM GOAL #1   Title ind with initial HEP    Time 4    Period Weeks    Status Achieved    Target Date 01/09/20      PT SHORT TERM GOAL #2   Title pt will be able to reach L4 with Rt UE for tucking shirt into pants    Baseline able to reach beyond L4    Time 4    Period Weeks    Status Achieved    Target Date 01/09/20      PT SHORT TERM GOAL #3   Title pain reduced by 25%    Baseline denies pain throughout the day    Time 4    Period Weeks    Status Achieved    Target Date 01/09/20      PT SHORT TERM GOAL #4   Title .Marland Kitchen             PT Long Term Goals - 12/16/19 1422      PT LONG TERM GOAL #1   Title patient able to get dressed by herself due to improved strength and ROM especially snapping her bra in the back    Time 12    Period Weeks    Status New    Target Date 03/05/20      PT LONG TERM GOAL #2   Baseline Can reach overhead 15x  in a row holding 1lb for putting away dishes    Time 12    Period Weeks    Status New    Target Date 03/05/20      PT LONG TERM GOAL #3   Title independent with advanced HEP    Time 12    Period Weeks    Status New    Target Date 03/05/20      PT LONG TERM GOAL #4   Title FOTO < or = to 38% limited    Time 16    Period Weeks    Status New    Target Date 03/05/20      PT LONG TERM GOAL #5   Title pain reduced by 80% or more when reaching in all directions    Baseline increases with reaching back and in certain ways    Time 12    Period Weeks    Status New    Target Date 03/05/20                 Plan - 02/07/20 1409    Clinical Impression Statement Pt is making good progress towards her goals. She can reach behind her back to beyond the level of L4 and note minimal pain in the Rt UE throughout the day. She has met all short term goals. Pt's HEP was updated to include resistance bands to promote shoulder strength. She demonstrated good understanding of these HEP additions. Will plan for discharge next visit assuming no issues/concerns arise.    PT Treatment/Interventions ADLs/Self Care Home Management;Biofeedback;Cryotherapy;Electrical Stimulation;Moist Heat;Therapeutic activities;Therapeutic exercise;Neuromuscular re-education;Patient/family education;Manual techniques;Passive range of motion;Dry needling;Taping    PT Next Visit Plan finalize HEP and d/c    PT Home Exercise Plan Access Code: [email or web address]    Consulted and Agree with Plan of Care Patient           Patient will benefit from skilled therapeutic intervention in order to improve the following deficits and impairments:  Pain, Increased fascial restricitons, Decreased strength,  Increased muscle spasms, Decreased range of motion  Visit Diagnosis: Acute pain of right shoulder  Stiffness of right shoulder, not elsewhere classified  Muscle weakness (generalized)     Problem List Patient Active Problem List    Diagnosis Date Noted  . Fracture, humerus, head 09/27/2019  . Morbid (severe) obesity due to excess calories ([place]) 08/30/2019  . Primary osteoarthritis, left shoulder 08/30/2019  . DM (diabetes mellitus), type 2 ([place]) 06/22/2018  . GAD (generalized anxiety disorder) 06/22/2018  . Primary osteoarthritis of left knee 03/10/2017  . Trochanteric bursitis of right hip 03/10/2017  . Anterior dislocation of left shoulder 12/20/2016  . Closed fracture of proximal end of left humerus with routine healing 12/20/2016  . Dyspnea on exertion 07/27/2016  . RBBB 03/04/2016  . OSA on CPAP 03/04/2016  . ILD (interstitial lung disease) ([place])  c/w NSIP p macrodantin exp 02/11/2016  . Morbid obesity due to excess calories ([place]) 02/11/2016  . Chronic respiratory failure with hypoxia ([place]) 01/07/2016  . Upper airway cough syndrome 12/06/2015  . COPD GOLD 0 12/05/2015  . Acute cholecystitis 07/19/2014    2:46 PM,02/07/20 Sherol Dade PT, DPT [facility] [address], [place], [place], [address] Phone: [phone]   Fax:  [phone]  Name: Teyanna Thielman MRN: [id number] Date of Birth: September 18, 1950

## 2020-02-09 ENCOUNTER — Encounter: Payer: Self-pay | Admitting: Orthopedic Surgery

## 2020-02-09 NOTE — Progress Notes (Signed)
Office Visit Note   Patient: Lindsay Brady           Date of Birth: [birth year]           MRN: [id number] Visit Date: 02/06/2020 Requested by: Lindsay Brady, Lindsay Patricia, MD [address] [place],  [place] [address] PCP: Lindsay Brady, Lindsay Patricia, MD  Subjective: Chief Complaint  Patient presents with  . review study    HPI: [name] is a 69 year old patient with right hip pain.  Since she was last seen she had an MRI scan of the hip which is reviewed.  She has mild right gluteal tendon tendinitis but no arthritis.  Hard for her to lay on the right side.  Mild bursitis is present.  She states she has some tramadol and Percocet available.  Denies much in the way of back pain.  No radicular symptoms.              ROS: All systems reviewed are negative as they relate to the chief complaint within the history of present illness.  Patient denies  fevers or chills.   Assessment & Plan: Visit Diagnoses:  1. Pain in right hip     Plan: Impression is right hip pain with no clear-cut arthritis in the hip joint.  She has a little bit of tendinitis not entirely unexpected for somebody in her age group.  Favor observation and strengthening.  Showed her some iliotibial band strengthening exercises for the bursitis.  We talked about an injection she wants to hold off on that.  She is going to follow-up as needed.  Follow-Up Instructions: Return if symptoms worsen or fail to improve.   Orders:  No orders of the defined types were placed in this encounter.  No orders of the defined types were placed in this encounter.     Procedures: No procedures performed   Clinical Data: No additional findings.  Objective: Vital Signs: There were no vitals taken for this visit.  Physical Exam:   Constitutional: Patient appears well-developed HEENT:  Head: Normocephalic Eyes:EOM are normal Neck: Normal range of motion Cardiovascular: Normal rate Pulmonary/chest: Effort  normal Neurologic: Patient is alert Skin: Skin is warm Psychiatric: Patient has normal mood and affect    Ortho Exam: Ortho exam demonstrates normal gait alignment.  No groin pain with internal X rotation of either leg.  She actually has pretty good hip flexion abduction adduction strength.  Mild trochanteric tenderness bilaterally.  No real pain with forward lateral bending and no nerve root tension signs.  No paresthesias L1 S1 bilaterally.  Specialty Comments:  No specialty comments available.  Imaging: No results found.   PMFS History: Patient Active Problem List   Diagnosis Date Noted  . Fracture, humerus, head 09/27/2019  . Morbid (severe) obesity due to excess calories (HCC) 08/30/2019  . Primary osteoarthritis, left shoulder 08/30/2019  . DM (diabetes mellitus), type 2 (HCC) 06/22/2018  . GAD (generalized anxiety disorder) 06/22/2018  . Primary osteoarthritis of left knee 03/10/2017  . Trochanteric bursitis of right hip 03/10/2017  . Anterior dislocation of left shoulder 12/20/2016  . Closed fracture of proximal end of left humerus with routine healing 12/20/2016  . Dyspnea on exertion 07/27/2016  . RBBB 03/04/2016  . OSA on CPAP 03/04/2016  . ILD (interstitial lung disease) (HCC)  c/w NSIP p macrodantin exp 02/11/2016  . Morbid obesity due to excess calories (HCC) 02/11/2016  . Chronic respiratory failure with hypoxia (HCC) 01/07/2016  . Upper airway cough syndrome  12/06/2015  . COPD GOLD 0 12/05/2015  . Acute cholecystitis 07/19/2014   Past Medical History:  Diagnosis Date  . Anxiety   . Arthritis   . Cholecystitis 07/2014  . COPD (chronic obstructive pulmonary disease) (HCC)   . Diabetes mellitus without complication (HCC)    type 2   . Fracture 2013 ?   left leg  . GERD (gastroesophageal reflux disease)    hx of gerd  . Headache   . Hx: UTI (urinary tract infection)   . OA (osteoarthritis) of knee   . Pneumonia    hx of viral pna years ago  .  Shortness of breath dyspnea   . Sleep apnea    uses cpap  . Urgency of urination     Family History  Problem Relation Age of Onset  . Stroke Mother   . Lung cancer Father        smoked  . Colon cancer Father   . Cancer Paternal Grandmother   . Cancer Paternal Grandfather     Past Surgical History:  Procedure Laterality Date  . ABDOMINAL HYSTERECTOMY    . CARPAL TUNNEL RELEASE    . CHOLECYSTECTOMY N/A 07/19/2014   Procedure: LAPAROSCOPIC CHOLECYSTECTOMY WITH INTRAOPERATIVE CHOLANGIOGRAM;  Surgeon: Violeta Gelinas, MD;  [facility];  Service: General;  Laterality: N/A;  . COLONOSCOPY  ?   2011  . KNEE ARTHROSCOPY    . TONSILLECTOMY     Social History   Occupational History  . Not on file  Tobacco Use  . Smoking status: Former Smoker    Packs/day: 2.00    Years: 30.00    Pack years: 60.00    Types: Cigarettes    Quit date: 05/31/1996    Years since quitting: 23.7  . Smokeless tobacco: Never Used  Vaping Use  . Vaping Use: Never used  Substance and Sexual Activity  . Alcohol use: No    Alcohol/week: 0.0 standard drinks    Comment: rare  . Drug use: No  . Sexual activity: Not on file

## 2020-02-11 DIAGNOSIS — R0989 Other specified symptoms and signs involving the circulatory and respiratory systems: Secondary | ICD-10-CM | POA: Diagnosis not present

## 2020-02-11 DIAGNOSIS — Z79899 Other long term (current) drug therapy: Secondary | ICD-10-CM | POA: Diagnosis not present

## 2020-02-11 DIAGNOSIS — M25512 Pain in left shoulder: Secondary | ICD-10-CM | POA: Diagnosis not present

## 2020-02-11 DIAGNOSIS — E119 Type 2 diabetes mellitus without complications: Secondary | ICD-10-CM | POA: Diagnosis not present

## 2020-02-14 ENCOUNTER — Encounter: Payer: Self-pay | Admitting: Physical Therapy

## 2020-02-14 ENCOUNTER — Other Ambulatory Visit: Payer: Self-pay

## 2020-02-14 ENCOUNTER — Ambulatory Visit: Payer: Medicare Other | Admitting: Physical Therapy

## 2020-02-14 DIAGNOSIS — M25511 Pain in right shoulder: Secondary | ICD-10-CM | POA: Diagnosis not present

## 2020-02-14 DIAGNOSIS — M25611 Stiffness of right shoulder, not elsewhere classified: Secondary | ICD-10-CM | POA: Diagnosis not present

## 2020-02-14 DIAGNOSIS — M6281 Muscle weakness (generalized): Secondary | ICD-10-CM

## 2020-02-14 NOTE — Therapy (Signed)
[facility] [address], [place], [place], [address] Phone: [phone]   Fax:  [phone]  Physical Therapy Treatment/Discharge  Patient Details  Name: Lindsay Brady MRN: [id number] Date of Birth: 07-17-1950 Referring Provider (PT): Marlou Sa Tonna Corner, MD   Encounter Date: 02/14/2020   PT End of Session - 02/14/20 1449    Visit Number 7    Date for PT Re-Evaluation 03/05/20    PT Start Time 9242    PT Stop Time 1525    PT Time Calculation (min) 40 min    Activity Tolerance Patient tolerated treatment well;No increased pain    Behavior During Therapy [facility] for tasks assessed/performed           Past Medical History:  Diagnosis Date   Anxiety    Arthritis    Cholecystitis 07/2014   COPD (chronic obstructive pulmonary disease) ([place])    Diabetes mellitus without complication ([place])    type 2    Fracture 2013 ?   left leg   GERD (gastroesophageal reflux disease)    hx of gerd   Headache    Hx: UTI (urinary tract infection)    OA (osteoarthritis) of knee    Pneumonia    hx of viral pna years ago   Shortness of breath dyspnea    Sleep apnea    uses cpap   Urgency of urination     Past Surgical History:  Procedure Laterality Date   ABDOMINAL HYSTERECTOMY     CARPAL TUNNEL RELEASE     CHOLECYSTECTOMY N/A 07/19/2014   Procedure: LAPAROSCOPIC CHOLECYSTECTOMY WITH INTRAOPERATIVE CHOLANGIOGRAM;  Surgeon: Georganna Skeans, MD;  Location: [place];  Service: General;  Laterality: N/A;   COLONOSCOPY  ?   2011   KNEE ARTHROSCOPY     TONSILLECTOMY      There were no vitals filed for this visit.   Subjective Assessment - 02/14/20 1448    Subjective Pt is pleased with her progress. She had no issues following her last session.    Limitations Lifting    Patient Stated Goals putting hair back; reaching behind the back, putting a button up shirt on; reaching overhead    Currently in Pain?  No/denies    Pain Onset More than a month ago                             [facility] Adult PT Treatment/Exercise - 02/14/20 0001      Shoulder Exercises: Seated   Row Both;Strengthening;10 reps    Theraband Level (Shoulder Row) Level 4 (Blue)    Row Limitations x2 sets     External Rotation Strengthening;Right;10 reps    Theraband Level (Shoulder External Rotation) Level 1 (Yellow)    External Rotation Limitations x2 sets     Internal Rotation Strengthening;Right;10 reps    Theraband Level (Shoulder Internal Rotation) Level 1 (Yellow)    Internal Rotation Limitations x2 sets    Flexion Strengthening;Both;10 reps    Flexion Weight (lbs) 2    Flexion Limitations #1 on Lt     Abduction Strengthening;Both;10 reps    ABduction Limitations #2 on Rt, #1 on Lt     Other Seated Exercises scaption: #2 on Rt, #1 on Lt x10 reps     Other Seated Exercises tricep extension blue TB 2x10 reps       Shoulder Exercises: ROM/Strengthening   UBE (Upper Arm Bike) x2 min forward/backwards L1  Wall Pushups 10 reps    Wall Pushups Limitations x2 sets     Ball on Wall Rt UE clockwise clockwise/counterclockwise 2x15 reps                   PT Education - 02/14/20 1511    Education Details updated HEP    Person(s) Educated Patient    Methods Explanation;Handout    Comprehension Returned demonstration;Verbalized understanding            PT Short Term Goals - 02/14/20 1514      PT SHORT TERM GOAL #1   Title ind with initial HEP    Time 4    Period Weeks    Status Achieved    Target Date 01/09/20      PT SHORT TERM GOAL #2   Title pt will be able to reach L4 with Rt UE for tucking shirt into pants    Baseline able to reach beyond L4    Time 4    Period Weeks    Status Achieved    Target Date 01/09/20      PT SHORT TERM GOAL #3   Title pain reduced by 25%    Baseline denies pain throughout the day    Time 4    Period Weeks    Status Achieved    Target Date  01/09/20      PT SHORT TERM GOAL #4   Title .Marland Kitchen             PT Long Term Goals - 02/14/20 1518      PT LONG TERM GOAL #1   Title patient able to get dressed by herself due to improved strength and ROM especially snapping her bra in the back    Time 12    Period Weeks    Status Achieved      PT LONG TERM GOAL #2   Baseline Can reach overhead 15x in a row holding 1lb for putting away dishes    Time 12    Period Weeks    Status Achieved      PT LONG TERM GOAL #3   Title independent with advanced HEP    Time 12    Period Weeks    Status Achieved      PT LONG TERM GOAL #4   Title FOTO < or = to 38% limited    Baseline 35% limited    Time 16    Period Weeks    Status New      PT LONG TERM GOAL #5   Title pain reduced by 80% or more when reaching in all directions    Baseline pain free    Time 12    Period Weeks    Status Achieved                 Plan - 02/14/20 1514    Clinical Impression Statement Pt was discharged this visit having met all her short- and long-term goals. She denies pain with daily activity and her active ROM on the Rt is within functional limits. Pt has been able to gradually increase resistance with shoulder elevation to 2lb. She would like to continue with her HEP on her own and is agreeable with discharge today.    PT Treatment/Interventions ADLs/Self Care Home Management;Biofeedback;Cryotherapy;Electrical Stimulation;Moist Heat;Therapeutic activities;Therapeutic exercise;Neuromuscular re-education;Patient/family education;Manual techniques;Passive range of motion;Dry needling;Taping    PT Next Visit Plan d/c    PT Home Exercise Plan Access Code: [email or web address]  Consulted and Agree with Plan of Care Patient           Patient will benefit from skilled therapeutic intervention in order to improve the following deficits and impairments:  Pain, Increased fascial restricitons, Decreased strength, Increased muscle spasms, Decreased range of  motion  Visit Diagnosis: Acute pain of right shoulder  Stiffness of right shoulder, not elsewhere classified  Muscle weakness (generalized)  PHYSICAL THERAPY DISCHARGE SUMMARY  Visits from Start of Care: 7  Current functional level related to goals / functional outcomes: See above for more details    Remaining deficits: See above for more details    Education / Equipment: See above for more details   Plan: Patient agrees to discharge.  Patient goals were met. Patient is being discharged due to meeting the stated rehab goals.  ?????        Problem List Patient Active Problem List   Diagnosis Date Noted   Fracture, humerus, head 09/27/2019   Morbid (severe) obesity due to excess calories ([place]) 08/30/2019   Primary osteoarthritis, left shoulder 08/30/2019   DM (diabetes mellitus), type 2 ([place]) 06/22/2018   GAD (generalized anxiety disorder) 06/22/2018   Primary osteoarthritis of left knee 03/10/2017   Trochanteric bursitis of right hip 03/10/2017   Anterior dislocation of left shoulder 12/20/2016   Closed fracture of proximal end of left humerus with routine healing 12/20/2016   Dyspnea on exertion 07/27/2016   RBBB 03/04/2016   OSA on CPAP 03/04/2016   ILD (interstitial lung disease) ([place])  c/w NSIP p macrodantin exp 02/11/2016   Morbid obesity due to excess calories ([place]) 02/11/2016   Chronic respiratory failure with hypoxia ([place]) 01/07/2016   Upper airway cough syndrome 12/06/2015   COPD GOLD 0 12/05/2015   Acute cholecystitis 07/19/2014    3:27 PM,02/14/20 Sherol Dade PT, DPT [facility] [address] [place], [place], [address] Phone: [phone]   Fax:  [phone]  Name: Truc Winfree MRN: [id number] Date of Birth: 1950/06/03

## 2020-02-21 ENCOUNTER — Encounter: Payer: Medicare Other | Admitting: Physical Therapy

## 2020-02-28 ENCOUNTER — Encounter: Payer: Medicare Other | Admitting: Physical Therapy

## 2020-03-04 DIAGNOSIS — R062 Wheezing: Secondary | ICD-10-CM | POA: Diagnosis not present

## 2020-03-04 DIAGNOSIS — J449 Chronic obstructive pulmonary disease, unspecified: Secondary | ICD-10-CM | POA: Diagnosis not present

## 2020-03-06 ENCOUNTER — Encounter: Payer: Medicare Other | Admitting: Physical Therapy

## 2020-03-12 ENCOUNTER — Telehealth: Payer: Self-pay

## 2020-03-12 NOTE — Telephone Encounter (Signed)
Please precert for left knee visco injections. Patient is aware that she must wait 34months before getting approval again. This is Dr.Whitfield's patient. Thanks!

## 2020-03-12 NOTE — Telephone Encounter (Signed)
yes

## 2020-03-12 NOTE — Telephone Encounter (Signed)
Called and spoke with patient. She is aware that she must wait 70months before getting approval again. I told her that someone would call her as soon as we got approval.

## 2020-03-12 NOTE — Telephone Encounter (Signed)
Noted  

## 2020-03-12 NOTE — Telephone Encounter (Signed)
Patient called she wants to start getting injections in her left knee she wants to know if the injections are going to be approved before she makes an appointment.call back:[phone]

## 2020-03-12 NOTE — Telephone Encounter (Signed)
She received her last visco injection on 09/26/2019. Are you okay with seeking approval for these again once it has been 6 months?

## 2020-03-13 ENCOUNTER — Encounter: Payer: Medicare Other | Admitting: Physical Therapy

## 2020-03-14 DIAGNOSIS — Z79899 Other long term (current) drug therapy: Secondary | ICD-10-CM | POA: Diagnosis not present

## 2020-03-14 DIAGNOSIS — E559 Vitamin D deficiency, unspecified: Secondary | ICD-10-CM | POA: Diagnosis not present

## 2020-03-14 DIAGNOSIS — E7800 Pure hypercholesterolemia, unspecified: Secondary | ICD-10-CM | POA: Diagnosis not present

## 2020-03-14 DIAGNOSIS — E119 Type 2 diabetes mellitus without complications: Secondary | ICD-10-CM | POA: Diagnosis not present

## 2020-03-14 DIAGNOSIS — Z23 Encounter for immunization: Secondary | ICD-10-CM | POA: Diagnosis not present

## 2020-03-14 DIAGNOSIS — R0989 Other specified symptoms and signs involving the circulatory and respiratory systems: Secondary | ICD-10-CM | POA: Diagnosis not present

## 2020-03-14 DIAGNOSIS — M25512 Pain in left shoulder: Secondary | ICD-10-CM | POA: Diagnosis not present

## 2020-03-14 DIAGNOSIS — Z20822 Contact with and (suspected) exposure to covid-19: Secondary | ICD-10-CM | POA: Diagnosis not present

## 2020-03-16 ENCOUNTER — Other Ambulatory Visit: Payer: Self-pay | Admitting: Internal Medicine

## 2020-03-16 DIAGNOSIS — F419 Anxiety disorder, unspecified: Secondary | ICD-10-CM

## 2020-03-16 DIAGNOSIS — N959 Unspecified menopausal and perimenopausal disorder: Secondary | ICD-10-CM

## 2020-03-19 DIAGNOSIS — G4733 Obstructive sleep apnea (adult) (pediatric): Secondary | ICD-10-CM | POA: Diagnosis not present

## 2020-03-24 ENCOUNTER — Telehealth: Payer: Self-pay

## 2020-03-24 NOTE — Telephone Encounter (Signed)
Submitted VOB, Synvisc Series, left knee. Appt.needs to be after 03/26/2020.

## 2020-04-04 DIAGNOSIS — J449 Chronic obstructive pulmonary disease, unspecified: Secondary | ICD-10-CM | POA: Diagnosis not present

## 2020-04-04 DIAGNOSIS — R062 Wheezing: Secondary | ICD-10-CM | POA: Diagnosis not present

## 2020-04-09 ENCOUNTER — Telehealth: Payer: Self-pay

## 2020-04-09 NOTE — Telephone Encounter (Signed)
Approved, Synvisc series, left knee. Buy & Bill Patient will be responsible for 20% OOP. $35.00 Co-pay each visit No PA required  Appt. 04/15/2020 with Dr. Cleophas Dunker

## 2020-04-14 DIAGNOSIS — R0989 Other specified symptoms and signs involving the circulatory and respiratory systems: Secondary | ICD-10-CM | POA: Diagnosis not present

## 2020-04-14 DIAGNOSIS — M25512 Pain in left shoulder: Secondary | ICD-10-CM | POA: Diagnosis not present

## 2020-04-14 DIAGNOSIS — Z79899 Other long term (current) drug therapy: Secondary | ICD-10-CM | POA: Diagnosis not present

## 2020-04-14 DIAGNOSIS — E119 Type 2 diabetes mellitus without complications: Secondary | ICD-10-CM | POA: Diagnosis not present

## 2020-04-15 ENCOUNTER — Other Ambulatory Visit: Payer: Self-pay

## 2020-04-15 ENCOUNTER — Ambulatory Visit: Payer: Medicare Other | Admitting: Orthopaedic Surgery

## 2020-04-15 ENCOUNTER — Encounter: Payer: Self-pay | Admitting: Orthopaedic Surgery

## 2020-04-15 VITALS — Ht 65.0 in | Wt 257.0 lb

## 2020-04-15 DIAGNOSIS — M1712 Unilateral primary osteoarthritis, left knee: Secondary | ICD-10-CM | POA: Diagnosis not present

## 2020-04-15 MED ORDER — HYLAN G-F 20 16 MG/2ML IX SOSY
16.0000 mg | PREFILLED_SYRINGE | INTRA_ARTICULAR | Status: AC | PRN
  Administered 2020-04-15: 16 mg via INTRA_ARTICULAR

## 2020-04-15 NOTE — Progress Notes (Signed)
Office Visit Note   Patient: Lindsay Brady           Date of Birth: [birth year]           MRN: [id number] Visit Date: 04/15/2020              Requested by: Philip Aspen, Limmie Patricia, MD [address] [place],  [place] [address] PCP: Philip Aspen, Limmie Patricia, MD   Assessment & Plan: Visit Diagnoses:  1. Primary osteoarthritis of left knee     Plan: First Synvisc injection left knee.  Return weekly for the next 2 weeks to complete the series  Follow-Up Instructions: Return in about 1 week (around 04/22/2020).   Orders:  Orders Placed This Encounter  Procedures  . Large Joint Inj: L knee   No orders of the defined types were placed in this encounter.     Procedures: Large Joint Inj: L knee on 04/15/2020 3:51 PM Indications: pain and joint swelling Details: 25 G 1.5 in needle, anteromedial approach  Arthrogram: No  Medications: 16 mg Hylan 16 MG/2ML Outcome: tolerated well, no immediate complications Procedure, treatment alternatives, risks and benefits explained, specific risks discussed. Consent was given by the patient. Immediately prior to procedure a time out was called to verify the correct patient, procedure, equipment, support staff and site/side marked as required. Patient was prepped and draped in the usual sterile fashion.       Clinical Data: No additional findings.   Subjective: Chief Complaint  Patient presents with  . Left Knee - Follow-up    Synvisc started 04/15/2020  Patient presents today for the first Synvisc injection in her left knee.   HPI  Review of Systems   Objective: Vital Signs: Ht 5\' 5"  (1.651 m)   Wt 257 lb (116.6 kg)   BMI 42.77 kg/m   Physical Exam  Ortho Exam left knee was not hot red warm or swollen.  Mild medial joint pain no pain laterally.  Full extension of flexed over 100 degrees.  No effusion  Specialty Comments:  No specialty comments available.  Imaging: No results found.   PMFS  History: Patient Active Problem List   Diagnosis Date Noted  . Fracture, humerus, head 09/27/2019  . Morbid (severe) obesity due to excess calories ([facility]) 08/30/2019  . Primary osteoarthritis, left shoulder 08/30/2019  . DM (diabetes mellitus), type 2 ([facility]) 06/22/2018  . GAD (generalized anxiety disorder) 06/22/2018  . Primary osteoarthritis of left knee 03/10/2017  . Trochanteric bursitis of right hip 03/10/2017  . Anterior dislocation of left shoulder 12/20/2016  . Closed fracture of proximal end of left humerus with routine healing 12/20/2016  . Dyspnea on exertion 07/27/2016  . RBBB 03/04/2016  . OSA on CPAP 03/04/2016  . ILD (interstitial lung disease) ([facility])  c/w NSIP p macrodantin exp 02/11/2016  . Morbid obesity due to excess calories ([facility]) 02/11/2016  . Chronic respiratory failure with hypoxia ([facility]) 01/07/2016  . Upper airway cough syndrome 12/06/2015  . COPD GOLD 0 12/05/2015  . Acute cholecystitis 07/19/2014   Past Medical History:  Diagnosis Date  . Anxiety   . Arthritis   . Cholecystitis 07/2014  . COPD (chronic obstructive pulmonary disease) ([facility])   . Diabetes mellitus without complication ([facility])    type 2   . Fracture 2013 ?   left leg  . GERD (gastroesophageal reflux disease)    hx of gerd  . Headache   . Hx: UTI (urinary tract infection)   . OA (  osteoarthritis) of knee   . Pneumonia    hx of viral pna years ago  . Shortness of breath dyspnea   . Sleep apnea    uses cpap  . Urgency of urination     Family History  Problem Relation Age of Onset  . Stroke Mother   . Lung cancer Father        smoked  . Colon cancer Father   . Cancer Paternal Grandmother   . Cancer Paternal Grandfather     Past Surgical History:  Procedure Laterality Date  . ABDOMINAL HYSTERECTOMY    . CARPAL TUNNEL RELEASE    . CHOLECYSTECTOMY N/A 07/19/2014   Procedure: LAPAROSCOPIC CHOLECYSTECTOMY WITH INTRAOPERATIVE CHOLANGIOGRAM;  Surgeon: Violeta Gelinas, MD;  Location: [facility] OR;   Service: General;  Laterality: N/A;  . COLONOSCOPY  ?   2011  . KNEE ARTHROSCOPY    . TONSILLECTOMY     Social History   Occupational History  . Not on file  Tobacco Use  . Smoking status: Former Smoker    Packs/day: 2.00    Years: 30.00    Pack years: 60.00    Types: Cigarettes    Quit date: 05/31/1996    Years since quitting: 23.8  . Smokeless tobacco: Never Used  Vaping Use  . Vaping Use: Never used  Substance and Sexual Activity  . Alcohol use: No    Alcohol/week: 0.0 standard drinks    Comment: rare  . Drug use: No  . Sexual activity: Not on file

## 2020-04-22 ENCOUNTER — Other Ambulatory Visit: Payer: Self-pay

## 2020-04-22 ENCOUNTER — Encounter: Payer: Self-pay | Admitting: Orthopaedic Surgery

## 2020-04-22 ENCOUNTER — Ambulatory Visit (INDEPENDENT_AMBULATORY_CARE_PROVIDER_SITE_OTHER): Payer: Medicare Other | Admitting: Orthopaedic Surgery

## 2020-04-22 DIAGNOSIS — M1712 Unilateral primary osteoarthritis, left knee: Secondary | ICD-10-CM | POA: Diagnosis not present

## 2020-04-22 MED ORDER — HYLAN G-F 20 16 MG/2ML IX SOSY
16.0000 mg | PREFILLED_SYRINGE | INTRA_ARTICULAR | Status: AC | PRN
  Administered 2020-04-22: 16 mg via INTRA_ARTICULAR

## 2020-04-22 NOTE — Progress Notes (Signed)
Office Visit Note   Patient: Lindsay Brady           Date of Birth: 1950/12/27           MRN: [id number] Visit Date: 04/22/2020              Requested by: Philip Aspen, Limmie Patricia, MD [address] [place],  [place] [address] PCP: Philip Aspen, Limmie Patricia, MD   Assessment & Plan: Visit Diagnoses:  1. Primary osteoarthritis of left knee     Plan: Second Synvisc injection left knee.  No problems related to the first injection  Follow-Up Instructions: Return in about 1 week (around 04/29/2020).   Orders:  No orders of the defined types were placed in this encounter.  No orders of the defined types were placed in this encounter.     Procedures: Large Joint Inj: L knee on 04/22/2020 3:46 PM Indications: pain and joint swelling Details: 25 G 1.5 in needle, anteromedial approach  Arthrogram: No  Medications: 16 mg Hylan 16 MG/2ML Outcome: tolerated well, no immediate complications Procedure, treatment alternatives, risks and benefits explained, specific risks discussed. Consent was given by the patient. Immediately prior to procedure a time out was called to verify the correct patient, procedure, equipment, support staff and site/side marked as required. Patient was prepped and draped in the usual sterile fashion.       Clinical Data: No additional findings.   Subjective: Chief Complaint  Patient presents with  . Left Knee - Pain  Lindsay Brady had the first Synvisc injection in her left knee last week without any problems.  She will have the second injection today  HPI  Review of Systems   Objective: Vital Signs: There were no vitals taken for this visit.  Physical Exam  Ortho Exam Left knee was not hot red warm or swollen.  No effusion.  Some anterior medial joint pain Specialty Comments:  No specialty comments available.  Imaging: No results found.   PMFS History: Patient Active Problem List   Diagnosis Date Noted  . Fracture,  humerus, head 09/27/2019  . Morbid (severe) obesity due to excess calories ([facility]) 08/30/2019  . Primary osteoarthritis, left shoulder 08/30/2019  . DM (diabetes mellitus), type 2 ([facility]) 06/22/2018  . GAD (generalized anxiety disorder) 06/22/2018  . Primary osteoarthritis of left knee 03/10/2017  . Trochanteric bursitis of right hip 03/10/2017  . Anterior dislocation of left shoulder 12/20/2016  . Closed fracture of proximal end of left humerus with routine healing 12/20/2016  . Dyspnea on exertion 07/27/2016  . RBBB 03/04/2016  . OSA on CPAP 03/04/2016  . ILD (interstitial lung disease) ([facility])  c/w NSIP p macrodantin exp 02/11/2016  . Morbid obesity due to excess calories ([facility]) 02/11/2016  . Chronic respiratory failure with hypoxia ([facility]) 01/07/2016  . Upper airway cough syndrome 12/06/2015  . COPD GOLD 0 12/05/2015  . Acute cholecystitis 07/19/2014   Past Medical History:  Diagnosis Date  . Anxiety   . Arthritis   . Cholecystitis 07/2014  . COPD (chronic obstructive pulmonary disease) ([facility])   . Diabetes mellitus without complication ([facility])    type 2   . Fracture 2013 ?   left leg  . GERD (gastroesophageal reflux disease)    hx of gerd  . Headache   . Hx: UTI (urinary tract infection)   . OA (osteoarthritis) of knee   . Pneumonia    hx of viral pna years ago  . Shortness of breath dyspnea   .  Sleep apnea    uses cpap  . Urgency of urination     Family History  Problem Relation Age of Onset  . Stroke Mother   . Lung cancer Father        smoked  . Colon cancer Father   . Cancer Paternal Grandmother   . Cancer Paternal Grandfather     Past Surgical History:  Procedure Laterality Date  . ABDOMINAL HYSTERECTOMY    . CARPAL TUNNEL RELEASE    . CHOLECYSTECTOMY N/A 07/19/2014   Procedure: LAPAROSCOPIC CHOLECYSTECTOMY WITH INTRAOPERATIVE CHOLANGIOGRAM;  Surgeon: Violeta Gelinas, MD;  Location: [facility];  Service: General;  Laterality: N/A;  . COLONOSCOPY  ?   2011  . KNEE  ARTHROSCOPY    . TONSILLECTOMY     Social History   Occupational History  . Not on file  Tobacco Use  . Smoking status: Former Smoker    Packs/day: 2.00    Years: 30.00    Pack years: 60.00    Types: Cigarettes    Quit date: 05/31/1996    Years since quitting: 23.9  . Smokeless tobacco: Never Used  Vaping Use  . Vaping Use: Never used  Substance and Sexual Activity  . Alcohol use: No    Alcohol/week: 0.0 standard drinks    Comment: rare  . Drug use: No  . Sexual activity: Not on file     Valeria Batman, MD   Note - This record has been created using [employer].  Chart creation errors have been sought, but may not always  have been located. Such creation errors do not reflect on  the standard of medical care.

## 2020-04-29 ENCOUNTER — Ambulatory Visit (INDEPENDENT_AMBULATORY_CARE_PROVIDER_SITE_OTHER): Payer: Medicare Other | Admitting: Orthopaedic Surgery

## 2020-04-29 ENCOUNTER — Other Ambulatory Visit: Payer: Self-pay

## 2020-04-29 ENCOUNTER — Encounter: Payer: Self-pay | Admitting: Orthopaedic Surgery

## 2020-04-29 VITALS — Ht 65.0 in | Wt 257.0 lb

## 2020-04-29 DIAGNOSIS — M1712 Unilateral primary osteoarthritis, left knee: Secondary | ICD-10-CM | POA: Diagnosis not present

## 2020-04-29 MED ORDER — LIDOCAINE HCL 1 % IJ SOLN
1.0000 mL | INTRAMUSCULAR | Status: AC | PRN
  Administered 2020-04-29: 1 mL

## 2020-04-29 MED ORDER — HYLAN G-F 20 16 MG/2ML IX SOSY
16.0000 mg | PREFILLED_SYRINGE | INTRA_ARTICULAR | Status: AC | PRN
  Administered 2020-04-29: 16 mg via INTRA_ARTICULAR

## 2020-04-29 NOTE — Progress Notes (Signed)
Office Visit Note   Patient: Lindsay Brady           Date of Birth: 12-02-1950           MRN: [id number] Visit Date: 04/29/2020              Requested by: Philip Aspen, Limmie Patricia, MD [address] [place],  [place] [address] PCP: Philip Aspen, Limmie Patricia, MD   Assessment & Plan: Visit Diagnoses:  1. Primary osteoarthritis of left knee     Plan:  #1: Synvisc No. 3 was injected without difficulty into the left knee.  She tolerated the procedure well. #2: Follow back up as needed  Follow-Up Instructions: No follow-ups on file.   Orders:  No orders of the defined types were placed in this encounter.  No orders of the defined types were placed in this encounter.     Procedures: Large Joint Inj: L knee on 04/29/2020 4:09 PM Indications: pain and joint swelling Details: 25 G 1.5 in needle, anteromedial approach  Arthrogram: No  Medications: 1 mL lidocaine 1 %; 16 mg Hylan 16 MG/2ML Outcome: tolerated well, no immediate complications Procedure, treatment alternatives, risks and benefits explained, specific risks discussed. Consent was given by the patient. Immediately prior to procedure a time out was called to verify the correct patient, procedure, equipment, support staff and site/side marked as required. Patient was prepped and draped in the usual sterile fashion.       Clinical Data: No additional findings.   Subjective: Chief Complaint  Patient presents with  . Left Knee - Follow-up    Synvisc started 04/15/2020   HPI Patient presents today for the third Synvisc injection in her left knee. She started the series on 04/15/2020. No changes in her knee thus far.    Review of Systems   Objective: Vital Signs: Ht 5\' 5"  (1.651 m)   Wt 257 lb (116.6 kg)   BMI 42.77 kg/m   Physical Exam  Ortho Exam  Exam today does not reveal any reactivity to the injections.  She is tender over the last injection site.  Specialty Comments:  No  specialty comments available.  Imaging: No results found.   PMFS History: Current Outpatient Medications  Medication Sig Dispense Refill  . acetaminophen (TYLENOL) 500 MG tablet Take 500 mg by mouth every 6 (six) hours as needed.    albuterol (PROVENTIL HFA;VENTOLIN HFA) 108 (90 Base) MCG/ACT inhaler Inhale 2 puffs into the lungs every 6 (six) hours as needed for wheezing or shortness of breath. 1 Inhaler 1  . aspirin EC 81 MG tablet Take 81 mg by mouth at bedtime.    . Cholecalciferol (VITAMIN D) 2000 UNITS CAPS Take 2,000 Units by mouth every evening.    . escitalopram (LEXAPRO) 10 MG tablet TAKE 1 TABLET BY MOUTH EVERYDAY AT BEDTIME 90 tablet 0  . famotidine (PEPCID) 20 MG tablet Take 20 mg by mouth at bedtime as needed for heartburn or indigestion.    Marland Kitchen ibuprofen (ADVIL,MOTRIN) 800 MG tablet Take 1 tablet (800 mg total) by mouth every 8 (eight) hours as needed. 90 tablet 0  . MYRBETRIQ 50 MG TB24 tablet TAKE 1 TABLET BY MOUTH EVERY DAY 90 tablet 1  . oxyCODONE-acetaminophen (PERCOCET/ROXICET) 5-325 MG tablet Take 1 tablet by mouth every 8 (eight) hours as needed for severe pain. 30 tablet 0  . OXYGEN 2lpm with sleep and 3lpm with exertion [facility]    . simvastatin (ZOCOR) 20 MG tablet TAKE  1 TABLET BY MOUTH EVERYDAY AT BEDTIME 90 tablet 3   No current facility-administered medications for this visit.     Patient Active Problem List   Diagnosis Date Noted  . Fracture, humerus, head 09/27/2019  . Morbid (severe) obesity due to excess calories ([facility]) 08/30/2019  . Primary osteoarthritis, left shoulder 08/30/2019  . DM (diabetes mellitus), type 2 ([facility]) 06/22/2018  . GAD (generalized anxiety disorder) 06/22/2018  . Primary osteoarthritis of left knee 03/10/2017  . Trochanteric bursitis of right hip 03/10/2017  . Anterior dislocation of left shoulder 12/20/2016  . Closed fracture of proximal end of left humerus with routine healing 12/20/2016  . Dyspnea on exertion 07/27/2016  . RBBB  03/04/2016  . OSA on CPAP 03/04/2016  . ILD (interstitial lung disease) ([facility])  c/w NSIP p macrodantin exp 02/11/2016  . Morbid obesity due to excess calories ([facility]) 02/11/2016  . Chronic respiratory failure with hypoxia ([facility]) 01/07/2016  . Upper airway cough syndrome 12/06/2015  . COPD GOLD 0 12/05/2015  . Acute cholecystitis 07/19/2014   Past Medical History:  Diagnosis Date  . Anxiety   . Arthritis   . Cholecystitis 07/2014  . COPD (chronic obstructive pulmonary disease) ([facility])   . Diabetes mellitus without complication ([facility])    type 2   . Fracture 2013 ?   left leg  . GERD (gastroesophageal reflux disease)    hx of gerd  . Headache   . Hx: UTI (urinary tract infection)   . OA (osteoarthritis) of knee   . Pneumonia    hx of viral pna years ago  . Shortness of breath dyspnea   . Sleep apnea    uses cpap  . Urgency of urination     Family History  Problem Relation Age of Onset  . Stroke Mother   . Lung cancer Father        smoked  . Colon cancer Father   . Cancer Paternal Grandmother   . Cancer Paternal Grandfather     Past Surgical History:  Procedure Laterality Date  . ABDOMINAL HYSTERECTOMY    . CARPAL TUNNEL RELEASE    . CHOLECYSTECTOMY N/A 07/19/2014   Procedure: LAPAROSCOPIC CHOLECYSTECTOMY WITH INTRAOPERATIVE CHOLANGIOGRAM;  Surgeon: Violeta Gelinas, MD;  Location: [facility] OR;  Service: General;  Laterality: N/A;  . COLONOSCOPY  ?   2011  . KNEE ARTHROSCOPY    . TONSILLECTOMY     Social History   Occupational History  . Not on file  Tobacco Use  . Smoking status: Former Smoker    Packs/day: 2.00    Years: 30.00    Pack years: 60.00    Types: Cigarettes    Quit date: 05/31/1996    Years since quitting: 23.9  . Smokeless tobacco: Never Used  Vaping Use  . Vaping Use: Never used  Substance and Sexual Activity  . Alcohol use: No    Alcohol/week: 0.0 standard drinks    Comment: rare  . Drug use: No  . Sexual activity: Not on file

## 2020-05-04 DIAGNOSIS — R062 Wheezing: Secondary | ICD-10-CM | POA: Diagnosis not present

## 2020-05-04 DIAGNOSIS — J449 Chronic obstructive pulmonary disease, unspecified: Secondary | ICD-10-CM | POA: Diagnosis not present

## 2020-05-12 DIAGNOSIS — M25512 Pain in left shoulder: Secondary | ICD-10-CM | POA: Diagnosis not present

## 2020-05-12 DIAGNOSIS — Z20822 Contact with and (suspected) exposure to covid-19: Secondary | ICD-10-CM | POA: Diagnosis not present

## 2020-05-12 DIAGNOSIS — Z79899 Other long term (current) drug therapy: Secondary | ICD-10-CM | POA: Diagnosis not present

## 2020-05-12 DIAGNOSIS — E119 Type 2 diabetes mellitus without complications: Secondary | ICD-10-CM | POA: Diagnosis not present

## 2020-05-12 DIAGNOSIS — Z1159 Encounter for screening for other viral diseases: Secondary | ICD-10-CM | POA: Diagnosis not present

## 2020-05-22 ENCOUNTER — Other Ambulatory Visit: Payer: Self-pay | Admitting: Internal Medicine

## 2020-05-22 DIAGNOSIS — E785 Hyperlipidemia, unspecified: Secondary | ICD-10-CM

## 2020-06-04 DIAGNOSIS — R062 Wheezing: Secondary | ICD-10-CM | POA: Diagnosis not present

## 2020-06-04 DIAGNOSIS — J449 Chronic obstructive pulmonary disease, unspecified: Secondary | ICD-10-CM | POA: Diagnosis not present

## 2020-06-05 DIAGNOSIS — E119 Type 2 diabetes mellitus without complications: Secondary | ICD-10-CM | POA: Diagnosis not present

## 2020-06-05 DIAGNOSIS — M25512 Pain in left shoulder: Secondary | ICD-10-CM | POA: Diagnosis not present

## 2020-06-05 DIAGNOSIS — M25569 Pain in unspecified knee: Secondary | ICD-10-CM | POA: Diagnosis not present

## 2020-06-05 DIAGNOSIS — Z79899 Other long term (current) drug therapy: Secondary | ICD-10-CM | POA: Diagnosis not present

## 2020-06-06 ENCOUNTER — Other Ambulatory Visit: Payer: Self-pay | Admitting: Internal Medicine

## 2020-06-06 ENCOUNTER — Telehealth: Payer: Self-pay | Admitting: Internal Medicine

## 2020-06-06 DIAGNOSIS — F419 Anxiety disorder, unspecified: Secondary | ICD-10-CM

## 2020-06-06 MED ORDER — ESCITALOPRAM OXALATE 10 MG PO TABS: ORAL_TABLET | ORAL | 0 refills | Status: DC

## 2020-06-06 NOTE — Telephone Encounter (Signed)
Rx sent 

## 2020-06-06 NOTE — Telephone Encounter (Signed)
pt need a refill, her physical is not until  March  escitalopram (LEXAPRO) 10 MG tablet [id number] [place], [place] - [id number]  Phone:  [phone] Fax:  [phone]

## 2020-06-17 DIAGNOSIS — G4733 Obstructive sleep apnea (adult) (pediatric): Secondary | ICD-10-CM | POA: Diagnosis not present

## 2020-07-03 DIAGNOSIS — Z79899 Other long term (current) drug therapy: Secondary | ICD-10-CM | POA: Diagnosis not present

## 2020-07-03 DIAGNOSIS — M25569 Pain in unspecified knee: Secondary | ICD-10-CM | POA: Diagnosis not present

## 2020-07-03 DIAGNOSIS — Z1159 Encounter for screening for other viral diseases: Secondary | ICD-10-CM | POA: Diagnosis not present

## 2020-07-03 DIAGNOSIS — E119 Type 2 diabetes mellitus without complications: Secondary | ICD-10-CM | POA: Diagnosis not present

## 2020-07-03 DIAGNOSIS — M25512 Pain in left shoulder: Secondary | ICD-10-CM | POA: Diagnosis not present

## 2020-07-05 DIAGNOSIS — R062 Wheezing: Secondary | ICD-10-CM | POA: Diagnosis not present

## 2020-07-05 DIAGNOSIS — J449 Chronic obstructive pulmonary disease, unspecified: Secondary | ICD-10-CM | POA: Diagnosis not present

## 2020-07-11 ENCOUNTER — Encounter: Payer: Self-pay | Admitting: *Deleted

## 2020-07-30 DIAGNOSIS — Z20822 Contact with and (suspected) exposure to covid-19: Secondary | ICD-10-CM | POA: Diagnosis not present

## 2020-07-30 DIAGNOSIS — R5383 Other fatigue: Secondary | ICD-10-CM | POA: Diagnosis not present

## 2020-07-30 DIAGNOSIS — E559 Vitamin D deficiency, unspecified: Secondary | ICD-10-CM | POA: Diagnosis not present

## 2020-07-30 DIAGNOSIS — E119 Type 2 diabetes mellitus without complications: Secondary | ICD-10-CM | POA: Diagnosis not present

## 2020-07-30 DIAGNOSIS — Z79899 Other long term (current) drug therapy: Secondary | ICD-10-CM | POA: Diagnosis not present

## 2020-07-30 DIAGNOSIS — M25512 Pain in left shoulder: Secondary | ICD-10-CM | POA: Diagnosis not present

## 2020-07-30 DIAGNOSIS — M25569 Pain in unspecified knee: Secondary | ICD-10-CM | POA: Diagnosis not present

## 2020-07-30 DIAGNOSIS — E7800 Pure hypercholesterolemia, unspecified: Secondary | ICD-10-CM | POA: Diagnosis not present

## 2020-07-30 DIAGNOSIS — F32A Depression, unspecified: Secondary | ICD-10-CM | POA: Diagnosis not present

## 2020-07-30 DIAGNOSIS — M129 Arthropathy, unspecified: Secondary | ICD-10-CM | POA: Diagnosis not present

## 2020-08-02 DIAGNOSIS — R062 Wheezing: Secondary | ICD-10-CM | POA: Diagnosis not present

## 2020-08-02 DIAGNOSIS — J449 Chronic obstructive pulmonary disease, unspecified: Secondary | ICD-10-CM | POA: Diagnosis not present

## 2020-08-12 ENCOUNTER — Other Ambulatory Visit: Payer: Self-pay

## 2020-08-13 ENCOUNTER — Ambulatory Visit (INDEPENDENT_AMBULATORY_CARE_PROVIDER_SITE_OTHER): Payer: Medicare Other | Admitting: Internal Medicine

## 2020-08-13 ENCOUNTER — Encounter: Payer: Self-pay | Admitting: Internal Medicine

## 2020-08-13 VITALS — BP 110/68 | HR 76 | Temp 98.4°F | Ht 64.25 in | Wt 235.1 lb

## 2020-08-13 DIAGNOSIS — Z0000 Encounter for general adult medical examination without abnormal findings: Secondary | ICD-10-CM | POA: Diagnosis not present

## 2020-08-13 DIAGNOSIS — Z13820 Encounter for screening for osteoporosis: Secondary | ICD-10-CM | POA: Diagnosis not present

## 2020-08-13 DIAGNOSIS — Z1211 Encounter for screening for malignant neoplasm of colon: Secondary | ICD-10-CM

## 2020-08-13 DIAGNOSIS — Z9989 Dependence on other enabling machines and devices: Secondary | ICD-10-CM

## 2020-08-13 DIAGNOSIS — E1142 Type 2 diabetes mellitus with diabetic polyneuropathy: Secondary | ICD-10-CM | POA: Diagnosis not present

## 2020-08-13 DIAGNOSIS — F411 Generalized anxiety disorder: Secondary | ICD-10-CM

## 2020-08-13 DIAGNOSIS — G4733 Obstructive sleep apnea (adult) (pediatric): Secondary | ICD-10-CM | POA: Diagnosis not present

## 2020-08-13 DIAGNOSIS — E785 Hyperlipidemia, unspecified: Secondary | ICD-10-CM

## 2020-08-13 DIAGNOSIS — E1169 Type 2 diabetes mellitus with other specified complication: Secondary | ICD-10-CM | POA: Diagnosis not present

## 2020-08-13 DIAGNOSIS — N959 Unspecified menopausal and perimenopausal disorder: Secondary | ICD-10-CM

## 2020-08-13 DIAGNOSIS — J449 Chronic obstructive pulmonary disease, unspecified: Secondary | ICD-10-CM | POA: Diagnosis not present

## 2020-08-13 DIAGNOSIS — F419 Anxiety disorder, unspecified: Secondary | ICD-10-CM

## 2020-08-13 MED ORDER — ESCITALOPRAM OXALATE 10 MG PO TABS: ORAL_TABLET | ORAL | 1 refills | Status: DC

## 2020-08-13 MED ORDER — SIMVASTATIN 20 MG PO TABS: ORAL_TABLET | ORAL | 1 refills | Status: DC

## 2020-08-13 MED ORDER — MIRABEGRON ER 50 MG PO TB24: 50.0000 mg | ORAL_TABLET | Freq: Every day | ORAL | 1 refills | Status: DC

## 2020-08-13 MED ORDER — ALBUTEROL SULFATE HFA 108 (90 BASE) MCG/ACT IN AERS: 2.0000 | INHALATION_SPRAY | Freq: Four times a day (QID) | RESPIRATORY_TRACT | 11 refills | Status: AC | PRN

## 2020-08-13 NOTE — Patient Instructions (Signed)
-Nice seeing you today!!  -Lab work today; will notify you once results are available.  -Remember to schedule your eye exam and shingles vaccines at the pharmacy.  -Schedule follow up in 4 months.   Preventive Care 15 Years and Older, Female Preventive care refers to lifestyle choices and visits with your health care provider that can promote health and wellness. This includes:  A yearly physical exam. This is also called an annual wellness visit.  Regular dental and eye exams.  Immunizations.  Screening for certain conditions.  Healthy lifestyle choices, such as: ? Eating a healthy diet. ? Getting regular exercise. ? Not using drugs or products that contain nicotine and tobacco. ? Limiting alcohol use. What can I expect for my preventive care visit? Physical exam Your health care provider will check your:  Height and weight. These may be used to calculate your BMI (body mass index). BMI is a measurement that tells if you are at a healthy weight.  Heart rate and blood pressure.  Body temperature.  Skin for abnormal spots. Counseling Your health care provider may ask you questions about your:  Past medical problems.  Family's medical history.  Alcohol, tobacco, and drug use.  Emotional well-being.  Home life and relationship well-being.  Sexual activity.  Diet, exercise, and sleep habits.  History of falls.  Memory and ability to understand (cognition).  Work and work Statistician.  Pregnancy and menstrual history.  Access to firearms. What immunizations do I need? Vaccines are usually given at various ages, according to a schedule. Your health care provider will recommend vaccines for you based on your age, medical history, and lifestyle or other factors, such as travel or where you work.   What tests do I need? Blood tests  Lipid and cholesterol levels. These may be checked every 5 years, or more often depending on your overall health.  Hepatitis C  test.  Hepatitis B test. Screening  Lung cancer screening. You may have this screening every year starting at age 56 if you have a 30-pack-year history of smoking and currently smoke or have quit within the past 15 years.  Colorectal cancer screening. ? All adults should have this screening starting at age 89 and continuing until age 84. ? Your health care provider may recommend screening at age 34 if you are at increased risk. ? You will have tests every 1-10 years, depending on your results and the type of screening test.  Diabetes screening. ? This is done by checking your blood sugar (glucose) after you have not eaten for a while (fasting). ? You may have this done every 1-3 years.  Mammogram. ? This may be done every 1-2 years. ? Talk with your health care provider about how often you should have regular mammograms.  Abdominal aortic aneurysm (AAA) screening. You may need this if you are a current or former smoker.  BRCA-related cancer screening. This may be done if you have a family history of breast, ovarian, tubal, or peritoneal cancers. Other tests  STD (sexually transmitted disease) testing, if you are at risk.  Bone density scan. This is done to screen for osteoporosis. You may have this done starting at age 48. Talk with your health care provider about your test results, treatment options, and if necessary, the need for more tests. Follow these instructions at home: Eating and drinking  Eat a diet that includes fresh fruits and vegetables, whole grains, lean protein, and low-fat dairy products. Limit your intake of foods with  high amounts of sugar, saturated fats, and salt.  Take vitamin and mineral supplements as recommended by your health care provider.  Do not drink alcohol if your health care provider tells you not to drink.  If you drink alcohol: ? Limit how much you have to 0-1 drink a day. ? Be aware of how much alcohol is in your drink. In the [place] one  drink equals one 12 oz bottle of beer (355 mL), one 5 oz glass of wine (148 mL), or one 1 oz glass of hard liquor (44 mL).   Lifestyle  Take daily care of your teeth and gums. Brush your teeth every morning and night with fluoride toothpaste. Floss one time each day.  Stay active. Exercise for at least 30 minutes 5 or more days each week.  Do not use any products that contain nicotine or tobacco, such as cigarettes, e-cigarettes, and chewing tobacco. If you need help quitting, ask your health care provider.  Do not use drugs.  If you are sexually active, practice safe sex. Use a condom or other form of protection in order to prevent STIs (sexually transmitted infections).  Talk with your health care provider about taking a low-dose aspirin or statin.  Find healthy ways to cope with stress, such as: ? Meditation, yoga, or listening to music. ? Journaling. ? Talking to a trusted person. ? Spending time with friends and family. Safety  Always wear your seat belt while driving or riding in a vehicle.  Do not drive: ? If you have been drinking alcohol. Do not ride with someone who has been drinking. ? When you are tired or distracted. ? While texting.  Wear a helmet and other protective equipment during sports activities.  If you have firearms in your house, make sure you follow all gun safety procedures. What's next?  Visit your health care provider once a year for an annual wellness visit.  Ask your health care provider how often you should have your eyes and teeth checked.  Stay up to date on all vaccines. This information is not intended to replace advice given to you by your health care provider. Make sure you discuss any questions you have with your health care provider. Document Revised: 05/07/2020 Document Reviewed: 05/11/2018 Elsevier Patient Education  2021 [employer].

## 2020-08-13 NOTE — Progress Notes (Signed)
Established Patient Office Visit     This visit occurred during the SARS-CoV-2 public health emergency.  Safety protocols were in place, including screening questions prior to the visit, additional usage of staff PPE, and extensive cleaning of exam room while observing appropriate contact time as indicated for disinfecting solutions.    CC/Reason for Visit: Annual preventive exam and subsequent Medicare wellness visit  HPI: Lindsay Brady is a 70 y.o. female who is coming in today for the above mentioned reasons.  She has been lost to medical care since late 2020.  She has a history of chronic hypoxemic respiratory failure due to COPD, obstructive sleep apnea, hyperlipidemia, type 2 diabetes with neuropathy.  She is not on any medications for diabetes.  She also has a history of generalized anxiety disorder.  She needs refills of Lexapro, simvastatin, albuterol, Myrbetriq.  She is not fasting so she will return for labs.  She has routine dental care but no eye care, she is not physically active.  She is overdue for mammogram and colonoscopy.  Immunizations are up-to-date with the exception of shingles.  She was told by her GYN that she no longer needed to have Pap smears due to having had a complete hysterectomy.  Past Medical/Surgical History: Past Medical History:  Diagnosis Date  . Anxiety   . Arthritis   . Cholecystitis 07/2014  . COPD (chronic obstructive pulmonary disease) ([place])   . Diabetes mellitus without complication ([place])    type 2   . Fracture 2013 ?   left leg  . GERD (gastroesophageal reflux disease)    hx of gerd  . Headache   . Hx: UTI (urinary tract infection)   . OA (osteoarthritis) of knee   . Pneumonia    hx of viral pna years ago  . Shortness of breath dyspnea   . Sleep apnea    uses cpap  . Urgency of urination     Past Surgical History:  Procedure Laterality Date  . ABDOMINAL HYSTERECTOMY    . CARPAL TUNNEL RELEASE    . CHOLECYSTECTOMY N/A  07/19/2014   Procedure: LAPAROSCOPIC CHOLECYSTECTOMY WITH INTRAOPERATIVE CHOLANGIOGRAM;  Surgeon: Georganna Skeans, MD;  Location: [facility];  Service: General;  Laterality: N/A;  . COLONOSCOPY  ?   2011  . KNEE ARTHROSCOPY    . TONSILLECTOMY      Social History:  reports that she quit smoking about 24 years ago. Her smoking use included cigarettes. She has a 60.00 pack-year smoking history. She has never used smokeless tobacco. She reports that she does not drink alcohol and does not use drugs.  Allergies: Allergies  Allergen Reactions  . Erythromycin Other (See Comments)    Pain in stomach   . Bactrim [Sulfamethoxazole-Trimethoprim] Rash  . Fosamax  [Alendronate Sodium] Cough    After taking 1 pill, pt states that she had a cough.     Family History:  Family History  Problem Relation Age of Onset  . Stroke Mother   . Lung cancer Father        smoked  . Colon cancer Father   . Cancer Paternal Grandmother   . Cancer Paternal Grandfather      Current Outpatient Medications:  .  acetaminophen (TYLENOL) 500 MG tablet, Take 500 mg by mouth every 6 (six) hours as needed., Disp: , Rfl:  .  b complex vitamins capsule, Take 1 capsule by mouth daily., Disp: , Rfl:  .  Cholecalciferol (VITAMIN D) 2000 UNITS CAPS,  Take 2,000 Units by mouth every evening., Disp: , Rfl:  .  famotidine (PEPCID) 20 MG tablet, Take 20 mg by mouth at bedtime as needed for heartburn or indigestion., Disp: , Rfl:  .  ibuprofen (ADVIL,MOTRIN) 800 MG tablet, Take 1 tablet (800 mg total) by mouth every 8 (eight) hours as needed., Disp: 90 tablet, Rfl: 0 .  oxyCODONE-acetaminophen (PERCOCET/ROXICET) 5-325 MG tablet, Take 1 tablet by mouth every 8 (eight) hours as needed for severe pain., Disp: 30 tablet, Rfl: 0 .  OXYGEN, 2lpm with sleep and 3lpm with exertion AHC, Disp: , Rfl:  .  albuterol (VENTOLIN HFA) 108 (90 Base) MCG/ACT inhaler, Inhale 2 puffs into the lungs every 6 (six) hours as needed for wheezing or shortness  of breath., Disp: 1 each, Rfl: 11 .  escitalopram (LEXAPRO) 10 MG tablet, Must keep appointment for further refills, Disp: 90 tablet, Rfl: 1 .  mirabegron ER (MYRBETRIQ) 50 MG TB24 tablet, Take 1 tablet (50 mg total) by mouth daily., Disp: 90 tablet, Rfl: 1 .  simvastatin (ZOCOR) 20 MG tablet, TAKE 1 TABLET BY MOUTH EVERYDAY AT BEDTIME, Disp: 90 tablet, Rfl: 1  Review of Systems:  Constitutional: Denies fever, chills, diaphoresis, appetite change and fatigue.  HEENT: Denies photophobia, eye pain, redness, hearing loss, ear pain, congestion, sore throat, rhinorrhea, sneezing, mouth sores, trouble swallowing, neck pain, neck stiffness and tinnitus.   Respiratory: Denies SOB, DOE, cough, chest tightness,  and wheezing.   Cardiovascular: Denies chest pain, palpitations and leg swelling.  Gastrointestinal: Denies nausea, vomiting, abdominal pain, diarrhea, constipation, blood in stool and abdominal distention.  Genitourinary: Denies dysuria, urgency, frequency, hematuria, flank pain and difficulty urinating.  Endocrine: Denies: hot or cold intolerance, sweats, changes in hair or nails, polyuria, polydipsia. Musculoskeletal: Denies myalgias, back pain, joint swelling, arthralgias and gait problem.  Skin: Denies pallor, rash and wound.  Neurological: Denies dizziness, seizures, syncope, weakness, light-headedness, numbness and headaches.  Hematological: Denies adenopathy. Easy bruising, personal or family bleeding history  Psychiatric/Behavioral: Denies suicidal ideation, mood changes, confusion, nervousness, sleep disturbance and agitation    Physical Exam: Vitals:   08/13/20 1311  BP: 110/68  Pulse: 76  Temp: 98.4 F (36.9 C)  TempSrc: Oral  SpO2: 97%  Weight: 235 lb 1.6 oz (106.6 kg)  Height: 5' 4.25" (1.632 m)    Body mass index is 40.04 kg/m.   Constitutional: NAD, calm, comfortable Eyes: PERRL, lids and conjunctivae normal ENMT: Mucous membranes are moist. Posterior pharynx  clear of any exudate or lesions. Normal dentition. Tympanic membrane is pearly white, no erythema or bulging. Neck: normal, supple, no masses, no thyromegaly Respiratory: clear to auscultation bilaterally, no wheezing, no crackles. Normal respiratory effort. No accessory muscle use.  Cardiovascular: Regular rate and rhythm, no murmurs / rubs / gallops. No extremity edema. 2+ pedal pulses.  Abdomen: no tenderness, no masses palpated. No hepatosplenomegaly. Bowel sounds positive.  Musculoskeletal: no clubbing / cyanosis. No joint deformity upper and lower extremities. Good ROM, no contractures. Normal muscle tone.  Skin: no rashes, lesions, ulcers. No induration Neurologic: CN 2-12 grossly intact. Sensation intact, DTR normal. Strength 5/5 in all 4.  Psychiatric: Normal judgment and insight. Alert and oriented x 3. Normal mood.   Subsequent Medicare wellness visit   1. Risk factors, based on past  M,S,F -cardiovascular disease risk factors include age, obesity, hyperlipidemia, type 2 diabetes   2.  Physical activities: Very sedentary   3.  Depression/mood:  History of anxiety, not depressed   4.  Hearing:  No perceived issues   5.  ADL's: Independent in all ADLs   6.  Fall risk:  Low fall risk   7.  Home safety: No problems identified   8.  Height weight, and visual acuity: height and weight as above, vision:   Visual Acuity Screening   Right eye Left eye Both eyes  Without correction: 20/25 20/20 20/25   With correction:        9.  Counseling:  Advised to increase physical activity   10. Lab orders based on risk factors: Laboratory update will be reviewed   11. Referral :  None today   12. Care plan:  Follow-up with me in 3 months   13. Cognitive assessment:  No cognitive impairment   14. Screening: Patient provided with a written and personalized 5-10 year screening schedule in the AVS.   yes   15. Provider List Update:   PCP, she also has a cardiologist and pulmonologist  but has not seen them in years  38. Advance Directives: Full code   17. Opioids: Patient is on chronic opioids prescribed by a separate provider. Has not displayed any signs of an opioid-use disorder.  PDMP has been reviewed today with no red flags and an overdose risk were of 280.   [facility] Office Visit from 06/28/2018 in [facility] at [facility]  PHQ-9 Total Score 5      Fall Risk  03/09/2019 06/28/2018 06/22/2018  Falls in the past year? 0 0 0  Number falls in past yr: - 0 0  Injury with Fall? - 0 0     Impression and Plan:  Screening for malignant neoplasm of colon  - Plan: Ambulatory referral to Gastroenterology  Encounter for preventive health examination  -Advised routine eye and dental care. -Due for shingles vaccine which she will obtain at pharmacy, otherwise immunizations are up-to-date including COVID. -Screening labs. -Healthy lifestyle discussed in detail. -Mammogram, colonoscopy have been requested. -She no longer does Pap smears after she had a complete hysterectomy at the urging of her GYN. -DEXA scan requested  Type 2 diabetes mellitus with diabetic polyneuropathy, without long-term current use of insulin (HCC)  -Check A1c, check microalbumin.  Is not currently on medication.  COPD GOLD 0  - Plan: albuterol (VENTOLIN HFA) 108 (90 Base) MCG/ACT inhaler -Uses oxygen only with exertion.  OSA on CPAP -Noted  Morbid (severe) obesity due to excess calories ([place]) -Discussed healthy lifestyle, including increased physical activity and better food choices to promote weight loss. -She has lost over 20 pounds doing caloric restriction.  GAD (generalized anxiety disorder)  - Plan: escitalopram (LEXAPRO) 10 MG tablet  Hyperlipidemia associated with type 2 diabetes mellitus ([place])  - Plan: simvastatin (ZOCOR) 20 MG tablet -Check lipids today.     Patient Instructions   -Nice seeing you today!!  -Lab work today; will notify you once results  are available.  -Remember to schedule your eye exam and shingles vaccines at the pharmacy.  -Schedule follow up in 4 months.   Preventive Care 44 Years and Older, Female Preventive care refers to lifestyle choices and visits with your health care provider that can promote health and wellness. This includes:  A yearly physical exam. This is also called an annual wellness visit.  Regular dental and eye exams.  Immunizations.  Screening for certain conditions.  Healthy lifestyle choices, such as: ? Eating a healthy diet. ? Getting regular exercise. ? Not using drugs or products that contain nicotine and tobacco. ?  Limiting alcohol use. What can I expect for my preventive care visit? Physical exam Your health care provider will check your:  Height and weight. These may be used to calculate your BMI (body mass index). BMI is a measurement that tells if you are at a healthy weight.  Heart rate and blood pressure.  Body temperature.  Skin for abnormal spots. Counseling Your health care provider may ask you questions about your:  Past medical problems.  Family's medical history.  Alcohol, tobacco, and drug use.  Emotional well-being.  Home life and relationship well-being.  Sexual activity.  Diet, exercise, and sleep habits.  History of falls.  Memory and ability to understand (cognition).  Work and work Statistician.  Pregnancy and menstrual history.  Access to firearms. What immunizations do I need? Vaccines are usually given at various ages, according to a schedule. Your health care provider will recommend vaccines for you based on your age, medical history, and lifestyle or other factors, such as travel or where you work.   What tests do I need? Blood tests  Lipid and cholesterol levels. These may be checked every 5 years, or more often depending on your overall health.  Hepatitis C test.  Hepatitis B test. Screening  Lung cancer screening. You may  have this screening every year starting at age 42 if you have a 30-pack-year history of smoking and currently smoke or have quit within the past 15 years.  Colorectal cancer screening. ? All adults should have this screening starting at age 28 and continuing until age 82. ? Your health care provider may recommend screening at age 25 if you are at increased risk. ? You will have tests every 1-10 years, depending on your results and the type of screening test.  Diabetes screening. ? This is done by checking your blood sugar (glucose) after you have not eaten for a while (fasting). ? You may have this done every 1-3 years.  Mammogram. ? This may be done every 1-2 years. ? Talk with your health care provider about how often you should have regular mammograms.  Abdominal aortic aneurysm (AAA) screening. You may need this if you are a current or former smoker.  BRCA-related cancer screening. This may be done if you have a family history of breast, ovarian, tubal, or peritoneal cancers. Other tests  STD (sexually transmitted disease) testing, if you are at risk.  Bone density scan. This is done to screen for osteoporosis. You may have this done starting at age 27. Talk with your health care provider about your test results, treatment options, and if necessary, the need for more tests. Follow these instructions at home: Eating and drinking  Eat a diet that includes fresh fruits and vegetables, whole grains, lean protein, and low-fat dairy products. Limit your intake of foods with high amounts of sugar, saturated fats, and salt.  Take vitamin and mineral supplements as recommended by your health care provider.  Do not drink alcohol if your health care provider tells you not to drink.  If you drink alcohol: ? Limit how much you have to 0-1 drink a day. ? Be aware of how much alcohol is in your drink. In the [place] one drink equals one 12 oz bottle of beer (355 mL), one 5 oz glass of wine (148  mL), or one 1 oz glass of hard liquor (44 mL).   Lifestyle  Take daily care of your teeth and gums. Brush your teeth every morning and night with fluoride toothpaste.  Floss one time each day.  Stay active. Exercise for at least 30 minutes 5 or more days each week.  Do not use any products that contain nicotine or tobacco, such as cigarettes, e-cigarettes, and chewing tobacco. If you need help quitting, ask your health care provider.  Do not use drugs.  If you are sexually active, practice safe sex. Use a condom or other form of protection in order to prevent STIs (sexually transmitted infections).  Talk with your health care provider about taking a low-dose aspirin or statin.  Find healthy ways to cope with stress, such as: ? Meditation, yoga, or listening to music. ? Journaling. ? Talking to a trusted person. ? Spending time with friends and family. Safety  Always wear your seat belt while driving or riding in a vehicle.  Do not drive: ? If you have been drinking alcohol. Do not ride with someone who has been drinking. ? When you are tired or distracted. ? While texting.  Wear a helmet and other protective equipment during sports activities.  If you have firearms in your house, make sure you follow all gun safety procedures. What's next?  Visit your health care provider once a year for an annual wellness visit.  Ask your health care provider how often you should have your eyes and teeth checked.  Stay up to date on all vaccines. This information is not intended to replace advice given to you by your health care provider. Make sure you discuss any questions you have with your health care provider. Document Revised: 05/07/2020 Document Reviewed: 05/11/2018 Elsevier Patient Education  2021 [place], MD [facility]

## 2020-08-14 ENCOUNTER — Other Ambulatory Visit: Payer: Self-pay

## 2020-08-14 ENCOUNTER — Other Ambulatory Visit (INDEPENDENT_AMBULATORY_CARE_PROVIDER_SITE_OTHER): Payer: Medicare Other

## 2020-08-14 DIAGNOSIS — Z0000 Encounter for general adult medical examination without abnormal findings: Secondary | ICD-10-CM | POA: Diagnosis not present

## 2020-08-14 DIAGNOSIS — E1142 Type 2 diabetes mellitus with diabetic polyneuropathy: Secondary | ICD-10-CM

## 2020-08-14 LAB — CBC WITH DIFFERENTIAL/PLATELET
Basophils Absolute: 0 10*3/uL (ref 0.0–0.1)
Basophils Relative: 0.5 % (ref 0.0–3.0)
Eosinophils Absolute: 0.1 10*3/uL (ref 0.0–0.7)
Eosinophils Relative: 2.1 % (ref 0.0–5.0)
HCT: 39.7 % (ref 36.0–46.0)
Hemoglobin: 13.4 g/dL (ref 12.0–15.0)
Lymphocytes Relative: 46.4 % — ABNORMAL HIGH (ref 12.0–46.0)
Lymphs Abs: 2.5 10*3/uL (ref 0.7–4.0)
MCHC: 33.9 g/dL (ref 30.0–36.0)
MCV: 95.2 fl (ref 78.0–100.0)
Monocytes Absolute: 0.5 10*3/uL (ref 0.1–1.0)
Monocytes Relative: 8.6 % (ref 3.0–12.0)
Neutro Abs: 2.3 10*3/uL (ref 1.4–7.7)
Neutrophils Relative %: 42.4 % — ABNORMAL LOW (ref 43.0–77.0)
Platelets: 213 10*3/uL (ref 150.0–400.0)
RBC: 4.17 Mil/uL (ref 3.87–5.11)
RDW: 14 % (ref 11.5–15.5)
WBC: 5.4 10*3/uL (ref 4.0–10.5)

## 2020-08-14 LAB — COMPREHENSIVE METABOLIC PANEL
ALT: 9 U/L (ref 0–35)
AST: 14 U/L (ref 0–37)
Albumin: 3.9 g/dL (ref 3.5–5.2)
Alkaline Phosphatase: 75 U/L (ref 39–117)
BUN: 17 mg/dL (ref 6–23)
CO2: 29 mEq/L (ref 19–32)
Calcium: 9.3 mg/dL (ref 8.4–10.5)
Chloride: 102 mEq/L (ref 96–112)
Creatinine, Ser: 0.74 mg/dL (ref 0.40–1.20)
GFR: 82.53 mL/min (ref >60.00)
Glucose, Bld: 106 mg/dL — ABNORMAL HIGH (ref 70–99)
Potassium: 4.2 mEq/L (ref 3.5–5.1)
Sodium: 138 mEq/L (ref 135–145)
Total Bilirubin: 0.6 mg/dL (ref 0.2–1.2)
Total Protein: 6.6 g/dL (ref 6.0–8.3)

## 2020-08-14 LAB — MICROALBUMIN / CREATININE URINE RATIO
Creatinine,U: 132 mg/dL
Microalb Creat Ratio: 1.3 mg/g (ref 0.0–30.0)
Microalb, Ur: 1.7 mg/dL (ref 0.0–1.9)

## 2020-08-14 LAB — LIPID PANEL
Cholesterol: 142 mg/dL (ref 0–200)
HDL: 53.3 mg/dL (ref >39.00)
LDL Cholesterol: 71 mg/dL (ref 0–99)
NonHDL: 88.78
Total CHOL/HDL Ratio: 3
Triglycerides: 87 mg/dL (ref 0.0–149.0)
VLDL: 17.4 mg/dL (ref 0.0–40.0)

## 2020-08-14 LAB — VITAMIN D 25 HYDROXY (VIT D DEFICIENCY, FRACTURES): VITD: 51.16 ng/mL (ref 30.00–100.00)

## 2020-08-14 LAB — VITAMIN B12: Vitamin B-12: 364 pg/mL (ref 211–911)

## 2020-08-14 LAB — TSH: TSH: 1.76 u[IU]/mL (ref 0.35–4.50)

## 2020-08-14 LAB — HEMOGLOBIN A1C: Hgb A1c MFr Bld: 6.1 % (ref 4.6–6.5)

## 2020-08-27 DIAGNOSIS — E119 Type 2 diabetes mellitus without complications: Secondary | ICD-10-CM | POA: Diagnosis not present

## 2020-08-27 DIAGNOSIS — M25512 Pain in left shoulder: Secondary | ICD-10-CM | POA: Diagnosis not present

## 2020-08-27 DIAGNOSIS — E7800 Pure hypercholesterolemia, unspecified: Secondary | ICD-10-CM | POA: Diagnosis not present

## 2020-08-27 DIAGNOSIS — M25569 Pain in unspecified knee: Secondary | ICD-10-CM | POA: Diagnosis not present

## 2020-08-27 DIAGNOSIS — Z79899 Other long term (current) drug therapy: Secondary | ICD-10-CM | POA: Diagnosis not present

## 2020-09-02 DIAGNOSIS — J449 Chronic obstructive pulmonary disease, unspecified: Secondary | ICD-10-CM | POA: Diagnosis not present

## 2020-09-02 DIAGNOSIS — R062 Wheezing: Secondary | ICD-10-CM | POA: Diagnosis not present

## 2020-09-15 DIAGNOSIS — G4733 Obstructive sleep apnea (adult) (pediatric): Secondary | ICD-10-CM | POA: Diagnosis not present

## 2020-09-24 DIAGNOSIS — R030 Elevated blood-pressure reading, without diagnosis of hypertension: Secondary | ICD-10-CM | POA: Diagnosis not present

## 2020-09-24 DIAGNOSIS — M25512 Pain in left shoulder: Secondary | ICD-10-CM | POA: Diagnosis not present

## 2020-09-24 DIAGNOSIS — G8929 Other chronic pain: Secondary | ICD-10-CM | POA: Diagnosis not present

## 2020-09-24 DIAGNOSIS — E119 Type 2 diabetes mellitus without complications: Secondary | ICD-10-CM | POA: Diagnosis not present

## 2020-09-24 DIAGNOSIS — Z79899 Other long term (current) drug therapy: Secondary | ICD-10-CM | POA: Diagnosis not present

## 2020-09-24 DIAGNOSIS — M25569 Pain in unspecified knee: Secondary | ICD-10-CM | POA: Diagnosis not present

## 2020-09-24 DIAGNOSIS — M545 Low back pain, unspecified: Secondary | ICD-10-CM | POA: Diagnosis not present

## 2020-10-02 DIAGNOSIS — J449 Chronic obstructive pulmonary disease, unspecified: Secondary | ICD-10-CM | POA: Diagnosis not present

## 2020-10-02 DIAGNOSIS — R062 Wheezing: Secondary | ICD-10-CM | POA: Diagnosis not present

## 2020-10-22 DIAGNOSIS — M25512 Pain in left shoulder: Secondary | ICD-10-CM | POA: Diagnosis not present

## 2020-10-22 DIAGNOSIS — E119 Type 2 diabetes mellitus without complications: Secondary | ICD-10-CM | POA: Diagnosis not present

## 2020-10-22 DIAGNOSIS — Z20822 Contact with and (suspected) exposure to covid-19: Secondary | ICD-10-CM | POA: Diagnosis not present

## 2020-10-22 DIAGNOSIS — Z79899 Other long term (current) drug therapy: Secondary | ICD-10-CM | POA: Diagnosis not present

## 2020-10-22 DIAGNOSIS — F32A Depression, unspecified: Secondary | ICD-10-CM | POA: Diagnosis not present

## 2020-11-02 DIAGNOSIS — R062 Wheezing: Secondary | ICD-10-CM | POA: Diagnosis not present

## 2020-11-02 DIAGNOSIS — J449 Chronic obstructive pulmonary disease, unspecified: Secondary | ICD-10-CM | POA: Diagnosis not present

## 2020-11-19 DIAGNOSIS — E119 Type 2 diabetes mellitus without complications: Secondary | ICD-10-CM | POA: Diagnosis not present

## 2020-11-19 DIAGNOSIS — Z79899 Other long term (current) drug therapy: Secondary | ICD-10-CM | POA: Diagnosis not present

## 2020-11-19 DIAGNOSIS — F32A Depression, unspecified: Secondary | ICD-10-CM | POA: Diagnosis not present

## 2020-11-19 DIAGNOSIS — M25512 Pain in left shoulder: Secondary | ICD-10-CM | POA: Diagnosis not present

## 2020-12-02 DIAGNOSIS — R062 Wheezing: Secondary | ICD-10-CM | POA: Diagnosis not present

## 2020-12-02 DIAGNOSIS — J449 Chronic obstructive pulmonary disease, unspecified: Secondary | ICD-10-CM | POA: Diagnosis not present

## 2020-12-15 DIAGNOSIS — G4733 Obstructive sleep apnea (adult) (pediatric): Secondary | ICD-10-CM | POA: Diagnosis not present

## 2020-12-17 DIAGNOSIS — M25512 Pain in left shoulder: Secondary | ICD-10-CM | POA: Diagnosis not present

## 2020-12-17 DIAGNOSIS — Z79899 Other long term (current) drug therapy: Secondary | ICD-10-CM | POA: Diagnosis not present

## 2020-12-17 DIAGNOSIS — E119 Type 2 diabetes mellitus without complications: Secondary | ICD-10-CM | POA: Diagnosis not present

## 2020-12-22 DIAGNOSIS — Z79899 Other long term (current) drug therapy: Secondary | ICD-10-CM | POA: Diagnosis not present

## 2021-01-02 DIAGNOSIS — J449 Chronic obstructive pulmonary disease, unspecified: Secondary | ICD-10-CM | POA: Diagnosis not present

## 2021-01-02 DIAGNOSIS — R062 Wheezing: Secondary | ICD-10-CM | POA: Diagnosis not present

## 2021-01-14 DIAGNOSIS — M25512 Pain in left shoulder: Secondary | ICD-10-CM | POA: Diagnosis not present

## 2021-01-14 DIAGNOSIS — Z79899 Other long term (current) drug therapy: Secondary | ICD-10-CM | POA: Diagnosis not present

## 2021-01-14 DIAGNOSIS — E119 Type 2 diabetes mellitus without complications: Secondary | ICD-10-CM | POA: Diagnosis not present

## 2021-01-16 DIAGNOSIS — Z79899 Other long term (current) drug therapy: Secondary | ICD-10-CM | POA: Diagnosis not present

## 2021-01-20 ENCOUNTER — Ambulatory Visit (INDEPENDENT_AMBULATORY_CARE_PROVIDER_SITE_OTHER): Payer: Medicare Other

## 2021-01-20 ENCOUNTER — Ambulatory Visit: Payer: Medicare Other | Admitting: Internal Medicine

## 2021-01-20 ENCOUNTER — Encounter: Payer: Self-pay | Admitting: Internal Medicine

## 2021-01-20 ENCOUNTER — Other Ambulatory Visit: Payer: Self-pay

## 2021-01-20 DIAGNOSIS — J849 Interstitial pulmonary disease, unspecified: Secondary | ICD-10-CM

## 2021-01-20 DIAGNOSIS — R059 Cough, unspecified: Secondary | ICD-10-CM | POA: Diagnosis not present

## 2021-01-20 DIAGNOSIS — J9611 Chronic respiratory failure with hypoxia: Secondary | ICD-10-CM | POA: Diagnosis not present

## 2021-01-20 DIAGNOSIS — R058 Other specified cough: Secondary | ICD-10-CM | POA: Diagnosis not present

## 2021-01-20 MED ORDER — FAMOTIDINE 20 MG PO TABS: ORAL_TABLET | ORAL | Status: AC

## 2021-01-20 NOTE — Patient Instructions (Addendum)
Change pepcid 20 mg to where you take it after bfast and supper   Make sure you check your oxygen saturation  at your highest level of activity  to be sure it stays over 90% and adjust  02 flow upward to maintain this level if needed but remember to turn it back to previous settings when you stop (to conserve your supply).    Please remember to go to the  x-ray department  for your tests - we will call you with the results when they are available     Please schedule a follow up visit in 6 months but call sooner if needed

## 2021-01-20 NOTE — Progress Notes (Signed)
Subjective:   Patient ID: Lindsay Brady, female    DOB: 1951/04/23    MRN: [id number]    Brief patient profile:  23 yowf quit smoking 1998 at wt around 200 with some am cough and did fine s rx but in 2011 noted doe across parking lot where worked and dx as copd by [employer] and self referred to pulmonary clinic 12/05/2015 as shifting all her care to [place] with new dx of ILD 02/10/2016 p macrodantin exposure and no evidence of airflow ostruction on symb 160 2bid with poor baseline hfa technique.     History of Present Illness  12/05/2015 1st [facility] office visit/ Lindsay Brady   Chief Complaint  Patient presents with   Pulmonary Consult    Self referral. Pt states dxed with Emphysema in 2011. She has been seen at [facility] in the past. She c/o hoarseness and increased cough for the past yr.  Cough is prod with white to tan sputum.  She also c/o SOB with walking long distances and up stairs. Hot weather tends to make her breathing worse. She uses ventolin 2 x per day on average.   indolent onset doe x 6 y better on tudorza and spiriva (mouth dry) and no change on symbicort  Needs saba at least twice daily  Already did rehab with wt = ? But did well  Doe = MMRC3 = can't walk 100 yards even at a slow pace at a flat grade s stopping due to sob   Cough p stopped smoking got worse then better and resolved until one year prior to OV  And present daily since but never while on cpap / neg w/u reflux by GI but ent disagreed  rec Plan A = Automatic = symbicort 160 Take 2 puffs first thing in am and then another 2 puffs about 12 hours later / tudorza one twice daily  Plan B = Backup Only use your albuterol as a rescue medication GERD diet  For drainage / throat tickle try take CHLORPHENIRAMINE  4 mg - take one every 4 hours as needed        03/23/2016  f/u ov/Lindsay Brady re:   ILD / macrodantin exp with high ESR  Chief Complaint  Patient presents with   Follow-up    Breathing is doing well. She  has been using 2lpm with rest and 3 with exertion. Her cough has improved some but she is still c/o hoarseness.   some better while on prednisone only and worse cough/ sob after stopped  rec Prednisone 10 mg  Take 2 daily until better then reduce to one daily x 2 weeks then one half daily    06/15/2016  f/u ov/Lindsay Brady re: PF on pred 10 mg daily  X 6 days p taper from 20 on 02 2lpm  / copd 0 on symb 160 2bid  Chief Complaint  Patient presents with   Follow-up    Pt is following up for ILD.   doe = 3lpm leaning on cart can do HT only = MMRC3 = can't walk 100 yards even at a slow pace at a flat grade s stopping due to sob  / never uses saba prior to ex / can't tell symbicort helps when misses doses Doing recumbant bike x 10 min no resistance not checking sats/ very little insight   rec Try to build up duration on bike at least 20 minutes before adding resistance stop symbicort  Prednisone 10 mg  Take 2 daily  until better then reduce to one daily x 2 weeks then one half daily  GERD diet    04/11/2018  f/u ov/Lindsay Brady re:  PF / 02 dep with ex and hs only  Chief Complaint  Patient presents with   Follow-up    Breathing is overall doing well. She rarely uses her albuterol.   Dyspnea:  Very little activity / all shopping on line, planning on purchasing inogen on her own. Cough: no Sleeping: > 30 degrees SABA use: rarely  02: 2lpm hs and 3lpm with activity  / not monitoring sats  As rec Rec Be sure you adjust your 02 with activity to goal of  Keeping it over  90% the entire duration that you are active    01/20/2021  f/u ov/Lindsay Brady re: PF likely due to macrodantin    maint on 02 prn  but not using  Chief Complaint  Patient presents with   Follow-up    Patient reports that she is about the same.     Dyspnea:  limited by back and L knee / R hip more than doe, not checking sats at all  Cough: sometimes with swallowing gets choked, no obvious HB but some sense of pnds esp bending over Sleeping: at  30-45 electric bed  SABA use: once a  week 02: not using all  Covid status:   vax x 4    No obvious day to day or daytime variability or assoc excess/ purulent sputum or mucus plugs or hemoptysis or cp or chest tightness, subjective wheeze   Sleeping  without nocturnal  or early am exacerbation  of respiratory  c/o's or need for noct saba. Also denies any obvious fluctuation of symptoms with weather or environmental changes or other aggravating or alleviating factors except as outlined above   No unusual exposure hx or h/o childhood pna/ asthma or knowledge of premature birth.  Current Allergies, Complete Past Medical History, Past Surgical History, Family History, and Social History were reviewed in [employer] record.  ROS  The following are not active complaints unless bolded Hoarseness, sore throat, dysphagia, dental problems, itching, sneezing,  nasal congestion or discharge of excess mucus or purulent secretions, ear ache,   fever, chills, sweats, unintended wt loss or wt gain, classically pleuritic or exertional cp,  orthopnea pnd or arm/hand swelling  or leg swelling, presyncope, palpitations, abdominal pain, anorexia, nausea, vomiting, diarrhea  or change in bowel habits or change in bladder habits, change in stools or change in urine, dysuria, hematuria,  rash, arthralgias, visual complaints, headache, numbness, weakness or ataxia or problems with walking or coordination,  change in mood or  memory.        Current Meds  Medication Sig   acetaminophen (TYLENOL) 500 MG tablet Take 500 mg by mouth every 6 (six) hours as needed.   albuterol (VENTOLIN HFA) 108 (90 Base) MCG/ACT inhaler Inhale 2 puffs into the lungs every 6 (six) hours as needed for wheezing or shortness of breath.   Cholecalciferol (DIALYVITE VITAMIN D 5000 PO) Take 5,000 Units by mouth daily.   escitalopram (LEXAPRO) 10 MG tablet Must keep appointment for further refills   famotidine (PEPCID) 20  MG tablet Take 20 mg by mouth at bedtime as needed for heartburn or indigestion.   ibuprofen (ADVIL,MOTRIN) 800 MG tablet Take 1 tablet (800 mg total) by mouth every 8 (eight) hours as needed.   Melatonin 10 MG TABS Take 10 mg by mouth at bedtime.   mirabegron  ER (MYRBETRIQ) 50 MG TB24 tablet Take 1 tablet (50 mg total) by mouth daily.   oxyCODONE-acetaminophen (PERCOCET/ROXICET) 5-325 MG tablet Take 1 tablet by mouth 4 (four) times daily.   OXYGEN 2lpm with sleep and 3lpm with exertion [facility]   simvastatin (ZOCOR) 20 MG tablet TAKE 1 TABLET BY MOUTH EVERYDAY AT BEDTIME              Objective:  Physical Exam    01/20/2021       231   04/11/2018    283  01/09/2018      275  07/12/2017      262  01/10/2017       259   10/08/2016       286  09/08/2016       294   07/27/2016      298  05/04/2016        290 03/23/2016     278   02/10/16 276 lb (125.2 kg)  01/07/16 274 lb (124.3 kg)  12/05/15 279 lb 3.2 oz (126.6 kg)   Vital signs reviewed  01/20/2021  - Note at rest 02 sats  96% on RA   General appearance:    amb mod obese wf nad     HEENT : pt wearing mask not removed for exam due to covid -19 concerns.    NECK :  without JVD/Nodes/TM/ nl carotid upstrokes bilaterally   LUNGS: no acc muscle use,  Nl contour chest which is clear to A and P bilaterally without cough on insp or exp maneuvers   CV:  RRR  no s3   2/6 SEM s  increase in P2, and trace pitting L > R LE edema   ABD:  soft and nontender with nl inspiratory excursion in the supine position. No bruits or organomegaly appreciated, bowel sounds nl  MS:  Nl gait/ ext warm without deformities, calf tenderness, cyanosis or clubbing No obvious joint restrictions   SKIN: warm and dry without lesions    NEURO:  alert, approp, nl sensorium with  no motor or cerebellar deficits apparent.         CXR PA and Lateral:   01/20/2021 :    I personally reviewed images and agree with radiology impression as follows:    Similar  appearance of reticulonodular opacities of the bilateral lungs compatible with lung scarring/fibrosis        Assessment & Plan:

## 2021-01-21 ENCOUNTER — Encounter: Payer: Self-pay | Admitting: Internal Medicine

## 2021-01-21 ENCOUNTER — Encounter: Payer: Self-pay | Admitting: *Deleted

## 2021-01-21 NOTE — Assessment & Plan Note (Addendum)
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 -  12/12/14  FVC = 2.11 - 06/19/15   FVC = 2.16  -  PFTs 05/18/16  FVC 2.07 (63%) no obst and dlco 52 corrects to 98 on 10 mg daily pred with esr 26 -  ESR 07/27/2016  =   31  @ pred 5 mg daily > wean off pred by mid March 2018 and no change in ESR 09/08/2016  - .HSP profile 09/08/2016 >  Neg -  Spirometry 09/08/2016  FVC  1.89 off pred  - 07/12/2017   Walked RA  2 laps @ 185 ft each stopped due to  Sob /desat to 80%  - ESR 07/12/2017 = 34 (no change ) - no desats walking 250 ft 01/20/2021   No evidence of clinical or radiographic progression 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

## 2021-01-21 NOTE — Assessment & Plan Note (Signed)
Trial of gerd diet/ 1st gen H1 12/05/2015 >>> - trial off tudorza 02/10/2016 >>> - flare with pseudowheeze at ov 09/08/2016 > rec max gerd rx - Spirometry 09/08/2016  No obst s rx prior  - 01/10/2017 flared off gerd rx but very mild - Allergy profile  07/12/17  >  Eos 0.1 /  IgE  68 RAST neg  Not assoc with choking sensation may just be gerd > rec double pepcid to 20 mg bid pc and follow diet/ swallowing eval if not improving

## 2021-01-21 NOTE — Assessment & Plan Note (Signed)
SATURATION QUALIFICATIONS:  02/10/2016  Patient Saturations on Room Air at Rest = 90% Patient Saturations on Room Air while Ambulating = 83% Patient Saturations on 3 Liters of oxygen while Ambulating = 90% - 07/27/2016   Walked RA  2 laps @ 185 ft each stopped due to  desat to 88% RA - 10/08/2016   Walked RA  2 laps @ 185 ft each stopped due to sob/ sats 88% fast pace   - 07/12/2017   Walked RA  2 laps @ 185 ft each stopped due to  Sob /desat to 80%  - 01/20/2021 walked RX slow pace x 250 ft with sats 94% and no sob   rx as of 01/20/2021  =  Titrate to keep sats > 90%    Doing better vs priors though may just be getting more sedentary and won't desaturat at that level  Advised: Make sure you check your oxygen saturation  at your highest level of activity  to be sure it stays over 90% and adjust  02 flow upward to maintain this level if needed but remember to turn it back to previous settings when you stop (to conserve your supply).    Each maintenance medication was reviewed in detail including emphasizing most importantly the difference between maintenance and prns and under what circumstances the prns are to be triggered using an action plan format where appropriate.  Total time for H and P, chart review, counseling,  directly observing portions of ambulatory 02 saturation study/ and generating customized AVS unique to this office visit / same day charting = 30 min

## 2021-02-02 DIAGNOSIS — J449 Chronic obstructive pulmonary disease, unspecified: Secondary | ICD-10-CM | POA: Diagnosis not present

## 2021-02-02 DIAGNOSIS — R062 Wheezing: Secondary | ICD-10-CM | POA: Diagnosis not present

## 2021-02-11 ENCOUNTER — Other Ambulatory Visit: Payer: Self-pay | Admitting: Internal Medicine

## 2021-02-11 DIAGNOSIS — G4700 Insomnia, unspecified: Secondary | ICD-10-CM | POA: Diagnosis not present

## 2021-02-11 DIAGNOSIS — M25512 Pain in left shoulder: Secondary | ICD-10-CM | POA: Diagnosis not present

## 2021-02-11 DIAGNOSIS — F419 Anxiety disorder, unspecified: Secondary | ICD-10-CM

## 2021-02-11 DIAGNOSIS — Z79899 Other long term (current) drug therapy: Secondary | ICD-10-CM | POA: Diagnosis not present

## 2021-02-11 DIAGNOSIS — E119 Type 2 diabetes mellitus without complications: Secondary | ICD-10-CM | POA: Diagnosis not present

## 2021-02-11 DIAGNOSIS — E1169 Type 2 diabetes mellitus with other specified complication: Secondary | ICD-10-CM

## 2021-02-11 DIAGNOSIS — E785 Hyperlipidemia, unspecified: Secondary | ICD-10-CM

## 2021-03-04 DIAGNOSIS — J449 Chronic obstructive pulmonary disease, unspecified: Secondary | ICD-10-CM | POA: Diagnosis not present

## 2021-03-04 DIAGNOSIS — R062 Wheezing: Secondary | ICD-10-CM | POA: Diagnosis not present

## 2021-03-11 DIAGNOSIS — M25512 Pain in left shoulder: Secondary | ICD-10-CM | POA: Diagnosis not present

## 2021-03-11 DIAGNOSIS — Z79899 Other long term (current) drug therapy: Secondary | ICD-10-CM | POA: Diagnosis not present

## 2021-03-11 DIAGNOSIS — E119 Type 2 diabetes mellitus without complications: Secondary | ICD-10-CM | POA: Diagnosis not present

## 2021-03-11 DIAGNOSIS — G4700 Insomnia, unspecified: Secondary | ICD-10-CM | POA: Diagnosis not present

## 2021-03-11 DIAGNOSIS — G8929 Other chronic pain: Secondary | ICD-10-CM | POA: Diagnosis not present

## 2021-03-14 ENCOUNTER — Other Ambulatory Visit: Payer: Self-pay | Admitting: Internal Medicine

## 2021-03-14 DIAGNOSIS — N959 Unspecified menopausal and perimenopausal disorder: Secondary | ICD-10-CM

## 2021-03-16 DIAGNOSIS — G4733 Obstructive sleep apnea (adult) (pediatric): Secondary | ICD-10-CM | POA: Diagnosis not present

## 2021-04-08 DIAGNOSIS — E119 Type 2 diabetes mellitus without complications: Secondary | ICD-10-CM | POA: Diagnosis not present

## 2021-04-08 DIAGNOSIS — R5383 Other fatigue: Secondary | ICD-10-CM | POA: Diagnosis not present

## 2021-04-08 DIAGNOSIS — M129 Arthropathy, unspecified: Secondary | ICD-10-CM | POA: Diagnosis not present

## 2021-04-08 DIAGNOSIS — E611 Iron deficiency: Secondary | ICD-10-CM | POA: Diagnosis not present

## 2021-04-08 DIAGNOSIS — Z0000 Encounter for general adult medical examination without abnormal findings: Secondary | ICD-10-CM | POA: Diagnosis not present

## 2021-04-08 DIAGNOSIS — G8929 Other chronic pain: Secondary | ICD-10-CM | POA: Diagnosis not present

## 2021-04-08 DIAGNOSIS — Z79899 Other long term (current) drug therapy: Secondary | ICD-10-CM | POA: Diagnosis not present

## 2021-04-08 DIAGNOSIS — R0602 Shortness of breath: Secondary | ICD-10-CM | POA: Diagnosis not present

## 2021-04-08 DIAGNOSIS — E559 Vitamin D deficiency, unspecified: Secondary | ICD-10-CM | POA: Diagnosis not present

## 2021-04-08 DIAGNOSIS — E7800 Pure hypercholesterolemia, unspecified: Secondary | ICD-10-CM | POA: Diagnosis not present

## 2021-04-08 DIAGNOSIS — M25512 Pain in left shoulder: Secondary | ICD-10-CM | POA: Diagnosis not present

## 2021-04-09 ENCOUNTER — Telehealth: Payer: Self-pay

## 2021-04-09 NOTE — Telephone Encounter (Signed)
Last OV 08/13/20 recommendation was to Schedule follow up in 4 months.  No appt noted at this time.  LVM instructions for pt to call office to schedule appt with PCP.

## 2021-05-06 DIAGNOSIS — Z23 Encounter for immunization: Secondary | ICD-10-CM | POA: Diagnosis not present

## 2021-05-06 DIAGNOSIS — R0602 Shortness of breath: Secondary | ICD-10-CM | POA: Diagnosis not present

## 2021-05-06 DIAGNOSIS — G8929 Other chronic pain: Secondary | ICD-10-CM | POA: Diagnosis not present

## 2021-05-06 DIAGNOSIS — E119 Type 2 diabetes mellitus without complications: Secondary | ICD-10-CM | POA: Diagnosis not present

## 2021-05-06 DIAGNOSIS — Z0000 Encounter for general adult medical examination without abnormal findings: Secondary | ICD-10-CM | POA: Diagnosis not present

## 2021-05-06 DIAGNOSIS — M25512 Pain in left shoulder: Secondary | ICD-10-CM | POA: Diagnosis not present

## 2021-05-06 DIAGNOSIS — M545 Low back pain, unspecified: Secondary | ICD-10-CM | POA: Diagnosis not present

## 2021-05-06 DIAGNOSIS — R030 Elevated blood-pressure reading, without diagnosis of hypertension: Secondary | ICD-10-CM | POA: Diagnosis not present

## 2021-05-06 DIAGNOSIS — Z79899 Other long term (current) drug therapy: Secondary | ICD-10-CM | POA: Diagnosis not present

## 2021-05-08 DIAGNOSIS — Z79899 Other long term (current) drug therapy: Secondary | ICD-10-CM | POA: Diagnosis not present

## 2021-06-03 DIAGNOSIS — M25512 Pain in left shoulder: Secondary | ICD-10-CM | POA: Diagnosis not present

## 2021-06-03 DIAGNOSIS — Z0000 Encounter for general adult medical examination without abnormal findings: Secondary | ICD-10-CM | POA: Diagnosis not present

## 2021-06-03 DIAGNOSIS — Z23 Encounter for immunization: Secondary | ICD-10-CM | POA: Diagnosis not present

## 2021-06-03 DIAGNOSIS — M545 Low back pain, unspecified: Secondary | ICD-10-CM | POA: Diagnosis not present

## 2021-06-03 DIAGNOSIS — G8929 Other chronic pain: Secondary | ICD-10-CM | POA: Diagnosis not present

## 2021-06-03 DIAGNOSIS — E119 Type 2 diabetes mellitus without complications: Secondary | ICD-10-CM | POA: Diagnosis not present

## 2021-06-03 DIAGNOSIS — Z79899 Other long term (current) drug therapy: Secondary | ICD-10-CM | POA: Diagnosis not present

## 2021-06-05 DIAGNOSIS — Z79899 Other long term (current) drug therapy: Secondary | ICD-10-CM | POA: Diagnosis not present

## 2021-06-12 ENCOUNTER — Other Ambulatory Visit: Payer: Self-pay | Admitting: Internal Medicine

## 2021-06-12 DIAGNOSIS — N959 Unspecified menopausal and perimenopausal disorder: Secondary | ICD-10-CM

## 2021-06-15 DIAGNOSIS — G4733 Obstructive sleep apnea (adult) (pediatric): Secondary | ICD-10-CM | POA: Diagnosis not present

## 2021-06-16 ENCOUNTER — Telehealth: Payer: Self-pay | Admitting: Internal Medicine

## 2021-06-16 MED ORDER — MIRABEGRON ER 50 MG PO TB24: 50.0000 mg | ORAL_TABLET | Freq: Every day | ORAL | 0 refills | Status: DC

## 2021-06-16 NOTE — Telephone Encounter (Signed)
Pharmacy comment: Alternative Requested:PT WOULD LIKE A 90 DAYS SUPPLY AND WE ONLY HAVE 60 TABS REMAINING OF THIS SCRIPT.  Last OV 08/13/20. Recommendation at that time was to schedule f/u in 4 months.  LVM instructions for pt to call office to schedule OV with PCP.

## 2021-06-16 NOTE — Telephone Encounter (Signed)
Pt made a appt on  08/17/21 for a cpe and stated she need her refill on her medication

## 2021-06-16 NOTE — Telephone Encounter (Signed)
Pt states she needs a refill of Myrbetriq. See refill encounter note.

## 2021-06-16 NOTE — Telephone Encounter (Signed)
Pt states she does not have a urologist. Requests that refill sent to the [facility]. Prescription refilled to pharmacy requested.

## 2021-07-01 DIAGNOSIS — M545 Low back pain, unspecified: Secondary | ICD-10-CM | POA: Diagnosis not present

## 2021-07-01 DIAGNOSIS — Z79899 Other long term (current) drug therapy: Secondary | ICD-10-CM | POA: Diagnosis not present

## 2021-07-01 DIAGNOSIS — E119 Type 2 diabetes mellitus without complications: Secondary | ICD-10-CM | POA: Diagnosis not present

## 2021-07-01 DIAGNOSIS — M25512 Pain in left shoulder: Secondary | ICD-10-CM | POA: Diagnosis not present

## 2021-07-01 DIAGNOSIS — Z23 Encounter for immunization: Secondary | ICD-10-CM | POA: Diagnosis not present

## 2021-07-01 DIAGNOSIS — R0602 Shortness of breath: Secondary | ICD-10-CM | POA: Diagnosis not present

## 2021-07-01 DIAGNOSIS — Z1211 Encounter for screening for malignant neoplasm of colon: Secondary | ICD-10-CM | POA: Diagnosis not present

## 2021-07-01 DIAGNOSIS — G8929 Other chronic pain: Secondary | ICD-10-CM | POA: Diagnosis not present

## 2021-07-03 DIAGNOSIS — Z79899 Other long term (current) drug therapy: Secondary | ICD-10-CM | POA: Diagnosis not present

## 2021-07-23 ENCOUNTER — Encounter: Payer: Medicare Other | Admitting: Internal Medicine

## 2021-07-24 ENCOUNTER — Ambulatory Visit: Payer: Medicare Other | Admitting: Internal Medicine

## 2021-07-29 DIAGNOSIS — Z79899 Other long term (current) drug therapy: Secondary | ICD-10-CM | POA: Diagnosis not present

## 2021-07-29 DIAGNOSIS — M545 Low back pain, unspecified: Secondary | ICD-10-CM | POA: Diagnosis not present

## 2021-07-29 DIAGNOSIS — E119 Type 2 diabetes mellitus without complications: Secondary | ICD-10-CM | POA: Diagnosis not present

## 2021-07-29 DIAGNOSIS — M25512 Pain in left shoulder: Secondary | ICD-10-CM | POA: Diagnosis not present

## 2021-07-29 DIAGNOSIS — G8929 Other chronic pain: Secondary | ICD-10-CM | POA: Diagnosis not present

## 2021-08-03 ENCOUNTER — Ambulatory Visit: Payer: Medicare Other | Admitting: Internal Medicine

## 2021-08-03 NOTE — Progress Notes (Deleted)
? ?Subjective:  ? ?Patient ID: Lindsay Brady, female    DOB: 1950/08/12    MRN: [id number] ? ?  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  ? ? ? ?History of Present Illness  ?12/05/2015 1st [facility] office visit/ Julio Storr   ?Chief Complaint  ?Patient presents with  ? Pulmonary Consult  ?  Self referral. Pt states dxed with Emphysema in 2011. She has been seen at [facility] in the past. She c/o hoarseness and increased cough for the past yr.  Cough is prod with white to tan sputum.  She also c/o SOB with walking long distances and up stairs. Hot weather tends to make her breathing worse. She uses ventolin 2 x per day on average.   ?indolent onset doe x 6 y better on tudorza and spiriva (mouth dry) and no change on symbicort  ?Needs saba at least twice daily  ?Already did rehab with wt = ? But did well  ?Doe = MMRC3 = can't walk 100 yards even at a slow pace at a flat grade s stopping due to sob   ?Cough p stopped smoking got worse then better and resolved until one year prior to OV  And present daily since but never while on cpap / neg w/u reflux by GI but ent disagreed  ?rec ?Plan A = Automatic = symbicort 160 Take 2 puffs first thing in am and then another 2 puffs about 12 hours later / tudorza one twice daily  ?Plan B = Backup ?Only use your albuterol as a rescue medication ?GERD diet  ?For drainage / throat tickle try take CHLORPHENIRAMINE  4 mg - take one every 4 hours as needed   ? ? ?  ? ?03/23/2016  f/u ov/Lindsay Brady re:   ILD / macrodantin exp with high ESR  ?Chief Complaint  ?Patient presents with  ? Follow-up  ?  Breathing is doing well. She  has been using 2lpm with rest and 3 with exertion. Her cough has improved some but she is still c/o hoarseness.   ?some better while on prednisone only and worse cough/ sob after stopped  ?rec ?Prednisone 10 mg  Take 2 daily until better then reduce to one daily x 2 weeks then one half daily  ? ? ?06/15/2016  f/u ov/Lindsay Brady re: PF on pred 10 mg daily  X 6 days p taper from 20 on 02 2lpm  / copd 0 on symb 160 2bid  ?Chief Complaint  ?Patient presents with  ? Follow-up  ?  Pt is following up for ILD.   ?doe = 3lpm leaning on cart can do HT only = MMRC3 = can't walk 100 yards even at a slow pace at a flat grade s stopping due to sob  / never uses saba prior to ex / can't tell symbicort helps when misses doses ?Doing recumbant bike x 10 Brady no resistance not checking sats/ very little insight   ?rec ?Try to build up duration on bike at least 20 minutes before adding resistance ?stop symbicort  ?Prednisone 10 mg  Take 2 daily  until better then reduce to one daily x 2 weeks then one half daily  ?GERD diet  ? ? ?04/11/2018  f/u ov/Lindsay Brady re:  PF / 02 dep with ex and hs only  ?Chief Complaint  ?Patient presents with  ? Follow-up  ?  Breathing is overall doing well. She rarely uses her albuterol.   ?Dyspnea:  Very little activity / all shopping on line, planning on purchasing inogen on her own. ?Cough: no ?Sleeping: > 30 degrees ?SABA use: rarely  ?02: 2lpm hs and 3lpm with activity  / not monitoring sats  As rec ?Rec ?Be sure you adjust your 02 with activity to goal of  Keeping it over  90% the entire duration that you are active  ? ? ?01/20/2021  f/u ov/Lindsay Brady re: PF likely due to macrodantin    maint on 02 prn  but not using  ?Chief Complaint  ?Patient presents with  ? Follow-up  ?  Patient reports that she is about the same.   ? Dyspnea:  limited by back and L knee / R hip more than doe, not checking sats at all  ?Cough: sometimes with swallowing gets choked, no obvious HB but some sense of pnds esp bending over ?Sleeping: at  30-45 electric bed  ?SABA use: once a  week ?02: not using all  ?Covid status:   vax x 4  ?Rec ?Change pepcid 20 mg to where you take it after bfast and supper  ?Make sure you check your oxygen saturation  at your highest level of activity  to be sure it stays over 90% ?Please remember to go to the  x-ray department  for your tests - we will call you with the results when they are available   ?Please schedule a follow up visit in 6 months but call sooner if needed  ? ? ?08/03/2021  f/u ov/Lindsay Brady re: ***   maint on ***  ?No chief complaint on file. ?  ?Dyspnea:  *** ?Cough: *** ?Sleeping: *** ?SABA use: *** ?02: *** ?Covid status:   *** ? ? ?No obvious day to day or daytime variability or assoc excess/ purulent sputum or mucus plugs or hemoptysis or cp or chest tightness, subjective wheeze or overt sinus or hb symptoms.  ? ?*** without nocturnal  or early am exacerbation  of respiratory  c/o's or need for noct saba. Also denies any obvious fluctuation of symptoms with weather or environmental changes or other aggravating or alleviating factors except as outlined above  ? ?No unusual exposure hx or h/o childhood pna/ asthma or knowledge of premature birth. ? ?Current Allergies, Complete Past Medical History, Past Surgical History, Family History, and Social History were reviewed in [employer] record. ? ?ROS  The following are not active complaints unless bolded ?Hoarseness, sore throat, dysphagia, dental problems, itching, sneezing,  nasal congestion or discharge of excess mucus or purulent secretions, ear ache,   fever, chills, sweats, unintended wt loss or wt gain, classically pleuritic or exertional cp,  orthopnea pnd or arm/hand swelling  or leg swelling, presyncope, palpitations, abdominal pain, anorexia, nausea, vomiting, diarrhea  or change in bowel habits or change in bladder habits, change in stools or change in urine, dysuria, hematuria,  rash, arthralgias, visual complaints, headache,  numbness, weakness or ataxia or problems with walking or coordination,  change in mood or  memory. ?      ? ?No outpatient medications have been marked as taking for the 08/03/21 encounter (Appointment) with  Tanda Rockers, MD.  ?    ? ?  ? ? ?    ?Objective:  ?Physical Exam ? ? 08/03/2021         ***  ?01/20/2021       231   ?04/11/2018     283  ?01/09/2018      275  ?07/12/2017      262  ?01/10/2017       259   ?10/08/2016       286  ?09/08/2016       294  ? 07/27/2016      298  ?05/04/2016        290 ?03/23/2016     278   ?02/10/16 276 lb (125.2 kg)  ?01/07/16 274 lb (124.3 kg)  ?12/05/15 279 lb 3.2 oz (126.6 kg)  ? ?Vital signs reviewed  08/03/2021  - Note at rest 02 sats  ***% on ***  ? ?General appearance:    ***  ? ? ?CV:  RRR  no s3   2/6 SEM s  increase in P2, and trace pitting L > R LE edema *** ? ?  ?   ? ?    ?Assessment & Plan:  ? ?

## 2021-08-05 ENCOUNTER — Ambulatory Visit: Payer: Medicare Other | Admitting: Internal Medicine

## 2021-08-05 ENCOUNTER — Other Ambulatory Visit: Payer: Self-pay

## 2021-08-05 ENCOUNTER — Encounter: Payer: Self-pay | Admitting: Internal Medicine

## 2021-08-05 VITALS — BP 120/64 | HR 66 | Temp 98.1°F | Ht 60.0 in | Wt 243.4 lb

## 2021-08-05 DIAGNOSIS — G4733 Obstructive sleep apnea (adult) (pediatric): Secondary | ICD-10-CM | POA: Diagnosis not present

## 2021-08-05 DIAGNOSIS — J449 Chronic obstructive pulmonary disease, unspecified: Secondary | ICD-10-CM | POA: Diagnosis not present

## 2021-08-05 DIAGNOSIS — J9611 Chronic respiratory failure with hypoxia: Secondary | ICD-10-CM | POA: Diagnosis not present

## 2021-08-05 DIAGNOSIS — J849 Interstitial pulmonary disease, unspecified: Secondary | ICD-10-CM | POA: Diagnosis not present

## 2021-08-05 DIAGNOSIS — R0609 Other forms of dyspnea: Secondary | ICD-10-CM | POA: Diagnosis not present

## 2021-08-05 DIAGNOSIS — Z9989 Dependence on other enabling machines and devices: Secondary | ICD-10-CM | POA: Diagnosis not present

## 2021-08-05 NOTE — Progress Notes (Signed)
? ?Subjective:  ? ?Patient ID: Lindsay Brady, female    DOB: 09-Dec-1950    MRN: [id number] ? ?  ?Brief patient profile:  ?70  yowf quit smoking 1998 at wt around 200 with some am cough and did fine s rx but in 2011 noted doe across parking lot where worked and dx as copd by [employer] and self referred to pulmonary clinic 12/05/2015 as shifting all her care to [place] with new dx of ILD 02/10/2016 p macrodantin exposure and no evidence of airflow ostruction on symb 160 2bid with poor baseline hfa technique.  ? ? ? ?History of Present Illness  ?12/05/2015 1st [facility] office visit/ Victoriana Aziz   ?Chief Complaint  ?Patient presents with  ? Pulmonary Consult  ?  Self referral. Pt states dxed with Emphysema in 2011. She has been seen at [facility] in the past. She c/o hoarseness and increased cough for the past yr.  Cough is prod with white to tan sputum.  She also c/o SOB with walking long distances and up stairs. Hot weather tends to make her breathing worse. She uses ventolin 2 x per day on average.   ?indolent onset doe x 6 y better on tudorza and spiriva (mouth dry) and no change on symbicort  ?Needs saba at least twice daily  ?Already did rehab with wt = ? But did well  ?Doe = MMRC3 = can't walk 100 yards even at a slow pace at a flat grade s stopping due to sob   ?Cough p stopped smoking got worse then better and resolved until one year prior to OV  And present daily since but never while on cpap / neg w/u reflux by GI but ent disagreed  ?rec ?Plan A = Automatic = symbicort 160 Take 2 puffs first thing in am and then another 2 puffs about 12 hours later / tudorza one twice daily  ?Plan B = Backup ?Only use your albuterol as a rescue medication ?GERD diet  ?For drainage / throat tickle try take CHLORPHENIRAMINE  4 mg - take one every 4 hours as needed   ? ? ?  ? ?03/23/2016  f/u ov/Sophiagrace Benbrook re:   ILD / macrodantin exp with high ESR  ?Chief Complaint  ?Patient presents with  ? Follow-up  ?  Breathing is doing well. She  has been using 2lpm with rest and 3 with exertion. Her cough has improved some but she is still c/o hoarseness.   ?some better while on prednisone only and worse cough/ sob after stopped  ?rec ?Prednisone 10 mg  Take 2 daily until better then reduce to one daily x 2 weeks then one half daily  ? ? ?01/20/2021  f/u ov/Mylie Mccurley re: PF likely due to macrodantin   maint on 02 prn  but not using  ?Chief Complaint  ?Patient presents with  ? Follow-up  ?  Patient reports that she is about the same.   ? Dyspnea:  limited by back and L knee / R hip more than doe, not checking sats at all  ?Cough: sometimes with swallowing gets choked, no obvious HB but some sense of pnds esp bending over ?Sleeping: at 30-45 electric bed  ?SABA use: once a  week ?02: not using all  ?Covid status:   vax x 4  ?Rec ?Change pepcid 20 mg to where you take it after bfast and supper  ?Make sure you check your oxygen saturation  at your highest level of activity  to be  sure it stays over 90% ?Please remember to go to the  x-ray department  for your tests - we will call you with the results when they are available   ?Please schedule a follow up visit in 6 months but call sooner if needed  ? ? ?08/05/2021  f/u ov/Wilkie Zenon re: PF from slepmacrodantin/ no flare off prednisone   ?Chief Complaint  ?Patient presents with  ? Follow-up  ?  Sleeps elevated but still feels like "choking" during the night, PND  ? 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 ? ?No obvious day to day or daytime variability or assoc excess/ purulent sputum or mucus plugs or hemoptysis or cp or chest tightness, subjective wheeze or overt sinus or hb symptoms.  ? ?Sleeping  without nocturnal  or early am exacerbation  of respiratory  c/o's or need for noct saba. Also denies any obvious  fluctuation of symptoms with weather or environmental changes or other aggravating or alleviating factors except as outlined above  ? ?No unusual exposure hx or h/o childhood pna/ asthma or knowledge of premature birth. ? ?Current Allergies, Complete Past Medical History, Past Surgical History, Family History, and Social History were reviewed in [employer] record. ? ?ROS  The following are not active complaints unless bolded ?Hoarseness, sore throat, dysphagia, dental problems, itching, sneezing,  nasal congestion or discharge of excess mucus or purulent secretions, ear ache,   fever, chills, sweats, unintended wt loss or wt gain, classically pleuritic or exertional cp,  orthopnea pnd or arm/hand swelling  or leg swelling, presyncope, palpitations, abdominal pain, anorexia, nausea, vomiting, diarrhea  or change in bowel habits or change in bladder habits, change in stools or change in urine, dysuria, hematuria,  rash, arthralgias, visual complaints, headache, numbness, weakness or ataxia or problems with walking or coordination,  change in mood or  memory. ?      ? ?Current Meds  ?Medication Sig  ? acetaminophen (TYLENOL) 500 MG tablet Take 500 mg by mouth every 6 (six) hours as needed.  ? albuterol (VENTOLIN HFA) 108 (90 Base) MCG/ACT inhaler Inhale 2 puffs into the lungs every 6 (six) hours as needed for wheezing or shortness of breath.  ? Cholecalciferol (DIALYVITE VITAMIN D 5000 PO) Take 5,000 Units by mouth daily.  ? escitalopram (LEXAPRO) 10 MG tablet TAKE 1 TABLET BY MOUTH EVERY DAY  ? famotidine (PEPCID) 20 MG tablet Take one After breakfast and supper  ? ibuprofen (ADVIL,MOTRIN) 800 MG tablet Take 1 tablet (800 mg total) by mouth every 8 (eight) hours as needed.  ? Melatonin 10 MG TABS Take 10 mg by mouth at bedtime.  ? mirabegron ER (MYRBETRIQ) 50 MG TB24 tablet Take 1 tablet (50 mg total) by mouth daily.  ? oxyCODONE-acetaminophen (PERCOCET/ROXICET) 5-325 MG tablet Take 1 tablet by  mouth 4 (four) times daily.  ? simvastatin (ZOCOR) 20 MG tablet TAKE 1 TABLET BY MOUTH EVERYDAY AT BEDTIME  ?    ? ?  ? ? ?    ?Objective:  ?Physical Exam ? ? 08/05/2021        243  ?01/20/2021       231   ?04/11/2018     283  ?01/09/2018  275  ?07/12/2017      262  ?01/10/2017       259   ?10/08/2016       286  ?09/08/2016       294  ? 07/27/2016      298  ?05/04/2016        290 ?03/23/2016     278   ?02/10/16 276 lb (125.2 kg)  ?01/07/16 274 lb (124.3 kg)  ?12/05/15 279 lb 3.2 oz (126.6 kg)  ? ?Vital signs reviewed  08/05/2021  - Note at rest 02 sats  98% on RA  ? ?General appearance:    obese amb wf nad   ? ? HEENT : pt wearing mask not removed for exam due to covid -19 concerns.  ? ? ?NECK :  without JVD/Nodes/TM/ nl carotid upstrokes bilaterally ? ? ?LUNGS: no acc muscle use,  Nl contour chest which is clear to A and P bilaterally without cough on insp or exp maneuvers ? ? ?CV:  RRR  no s3 or 2-3/6 sem s increase in P2, and no edema  ? ?ABD:  soft and nontender with nl inspiratory excursion in the supine position. No bruits or organomegaly appreciated, bowel sounds nl ? ?MS:  Nl gait/ ext warm without deformities, calf tenderness, cyanosis or clubbing ?No obvious joint restrictions  ? ?SKIN: warm and dry without lesions   ? ?NEURO:  alert, approp, nl sensorium with  no motor or cerebellar deficits apparent.  ? ?  ?   ? ?    ?Assessment & Plan:  ? ?

## 2021-08-05 NOTE — Assessment & Plan Note (Signed)
Body mass index is 47.54 kg/m?.  -  trending up  ?Lab Results  ?Component Value Date  ? TSH 1.76 08/14/2020  ?  ? ? ?Contributing to doe and risk of GERD ?>>>   reviewed the need and the process to achieve and maintain neg calorie balance > defer f/u primary care including intermittently monitoring thyroid status    ? ? ? ?

## 2021-08-05 NOTE — Patient Instructions (Addendum)
We will be referring you to one of our sleep doctors here ? ?Make sure you check your oxygen saturation  AT  your highest level of activity (not after you stop)   to be sure it stays over 90% and adjust  02 flow upward to maintain this level if needed but remember to turn it back to previous settings when you stop (to conserve your supply). ? ? ?We will walk again to today to see if you qualify for 0xygen > desat p 250 ft so rec titrate to keep > 90%  ? ?Echo ordered  ?

## 2021-08-05 NOTE — Assessment & Plan Note (Signed)
cpap per Dr Chewing in [place]> referred to sleep medicine 08/05/2021  ? ?For now will assume also needs 02 2lpm with the cpap  ?

## 2021-08-05 NOTE — Assessment & Plan Note (Signed)
SATURATION QUALIFICATIONS:  02/10/2016  ?Patient Saturations on Room Air at Rest = 90% ?Patient Saturations on Room Air while Ambulating = 83% ?Patient Saturations on 3 Liters of oxygen while Ambulating = 90% ?- 07/27/2016   Walked RA  2 laps @ 185 ft each stopped due to  desat to 88% RA ?- 10/08/2016   Walked RA  2 laps @ 185 ft each stopped due to sob/ sats 88% fast pace   ?- 07/12/2017   Walked RA  2 laps @ 185 ft each stopped due to  Sob /desat to 80%  ?- 01/20/2021 walked RX slow pace x 250 ft with sats 94% and no sob ?- 08/05/2021   Walked on RA*  x  one  lap(s) =  approx 250  ft  @ nl pace, stopped due to sob with sats 87%  Then placed on 2lpm and completed 577ft  with lowest 02 sats 97%  ? ?Doubt we'll see much more improvement in oxygenation esp if unable to lose wt  ? ?Advised: ?Make sure you check your oxygen saturation  AT  your highest level of activity (not after you stop)   to be sure it stays over 90% and adjust  02 flow upward to maintain this level if needed but remember to turn it back to previous settings when you stop (to conserve your supply). ? ? ? ?

## 2021-08-05 NOTE — Assessment & Plan Note (Signed)
Echo 08/05/2021 >>>  ? ?Suspect she may developing AS  Based on heart m today > echo next step ? ? ?Each maintenance medication was reviewed in detail including emphasizing most importantly the difference between maintenance and prns and under what circumstances the prns are to be triggered using an action plan format where appropriate. ? ?Total time for H and P, chart review, counseling,  directly observing portions of ambulatory 02 saturation study/ and generating customized AVS unique to this office visit / same day charting  > 30 min  ?     ?  ?       ?

## 2021-08-05 NOTE — Assessment & Plan Note (Addendum)
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 ?S/p remote macrodantin exp so Doubt we'll see much more improvement in oxygenation esp if unable to lose wt  ? ?  ?

## 2021-08-11 ENCOUNTER — Other Ambulatory Visit: Payer: Self-pay | Admitting: Internal Medicine

## 2021-08-11 DIAGNOSIS — E1169 Type 2 diabetes mellitus with other specified complication: Secondary | ICD-10-CM

## 2021-08-11 DIAGNOSIS — F419 Anxiety disorder, unspecified: Secondary | ICD-10-CM

## 2021-08-17 ENCOUNTER — Encounter: Payer: Medicare Other | Admitting: Internal Medicine

## 2021-08-20 ENCOUNTER — Encounter (INDEPENDENT_AMBULATORY_CARE_PROVIDER_SITE_OTHER): Payer: Self-pay

## 2021-08-24 ENCOUNTER — Encounter: Payer: Self-pay | Admitting: Internal Medicine

## 2021-08-24 ENCOUNTER — Ambulatory Visit (INDEPENDENT_AMBULATORY_CARE_PROVIDER_SITE_OTHER): Payer: Medicare Other | Admitting: Internal Medicine

## 2021-08-24 VITALS — BP 110/74 | HR 70 | Temp 98.5°F | Ht 64.0 in | Wt 242.3 lb

## 2021-08-24 DIAGNOSIS — Z0000 Encounter for general adult medical examination without abnormal findings: Secondary | ICD-10-CM

## 2021-08-24 DIAGNOSIS — Z1231 Encounter for screening mammogram for malignant neoplasm of breast: Secondary | ICD-10-CM

## 2021-08-24 DIAGNOSIS — Z13820 Encounter for screening for osteoporosis: Secondary | ICD-10-CM | POA: Diagnosis not present

## 2021-08-24 DIAGNOSIS — Z780 Asymptomatic menopausal state: Secondary | ICD-10-CM

## 2021-08-24 DIAGNOSIS — Z1211 Encounter for screening for malignant neoplasm of colon: Secondary | ICD-10-CM

## 2021-08-24 DIAGNOSIS — Z1283 Encounter for screening for malignant neoplasm of skin: Secondary | ICD-10-CM | POA: Diagnosis not present

## 2021-08-24 DIAGNOSIS — J449 Chronic obstructive pulmonary disease, unspecified: Secondary | ICD-10-CM

## 2021-08-24 DIAGNOSIS — E1142 Type 2 diabetes mellitus with diabetic polyneuropathy: Secondary | ICD-10-CM | POA: Diagnosis not present

## 2021-08-24 DIAGNOSIS — B351 Tinea unguium: Secondary | ICD-10-CM

## 2021-08-24 LAB — COMPREHENSIVE METABOLIC PANEL
ALT: 13 U/L (ref 0–35)
AST: 17 U/L (ref 0–37)
Albumin: 4.4 g/dL (ref 3.5–5.2)
Alkaline Phosphatase: 62 U/L (ref 39–117)
BUN: 20 mg/dL (ref 6–23)
CO2: 28 mEq/L (ref 19–32)
Calcium: 9.8 mg/dL (ref 8.4–10.5)
Chloride: 102 mEq/L (ref 96–112)
Creatinine, Ser: 0.8 mg/dL (ref 0.40–1.20)
GFR: 74.62 mL/min (ref >60.00)
Glucose, Bld: 101 mg/dL — ABNORMAL HIGH (ref 70–99)
Potassium: 4 mEq/L (ref 3.5–5.1)
Sodium: 137 mEq/L (ref 135–145)
Total Bilirubin: 0.5 mg/dL (ref 0.2–1.2)
Total Protein: 7.4 g/dL (ref 6.0–8.3)

## 2021-08-24 LAB — MICROALBUMIN / CREATININE URINE RATIO
Creatinine,U: 127.5 mg/dL
Microalb Creat Ratio: 1.8 mg/g (ref 0.0–30.0)
Microalb, Ur: 2.2 mg/dL — ABNORMAL HIGH (ref 0.0–1.9)

## 2021-08-24 LAB — CBC WITH DIFFERENTIAL/PLATELET
Basophils Absolute: 0 10*3/uL (ref 0.0–0.1)
Basophils Relative: 0.4 % (ref 0.0–3.0)
Eosinophils Absolute: 0.1 10*3/uL (ref 0.0–0.7)
Eosinophils Relative: 1.3 % (ref 0.0–5.0)
HCT: 39.5 % (ref 36.0–46.0)
Hemoglobin: 13.4 g/dL (ref 12.0–15.0)
Lymphocytes Relative: 39.7 % (ref 12.0–46.0)
Lymphs Abs: 2.7 10*3/uL (ref 0.7–4.0)
MCHC: 33.9 g/dL (ref 30.0–36.0)
MCV: 94.8 fl (ref 78.0–100.0)
Monocytes Absolute: 0.5 10*3/uL (ref 0.1–1.0)
Monocytes Relative: 7.3 % (ref 3.0–12.0)
Neutro Abs: 3.5 10*3/uL (ref 1.4–7.7)
Neutrophils Relative %: 51.3 % (ref 43.0–77.0)
Platelets: 219 10*3/uL (ref 150.0–400.0)
RBC: 4.17 Mil/uL (ref 3.87–5.11)
RDW: 13.9 % (ref 11.5–15.5)
WBC: 6.9 10*3/uL (ref 4.0–10.5)

## 2021-08-24 LAB — HEMOGLOBIN A1C: Hgb A1c MFr Bld: 6.3 % (ref 4.6–6.5)

## 2021-08-24 LAB — LIPID PANEL
Cholesterol: 138 mg/dL (ref 0–200)
HDL: 61.2 mg/dL (ref >39.00)
LDL Cholesterol: 62 mg/dL (ref 0–99)
NonHDL: 77.03
Total CHOL/HDL Ratio: 2
Triglycerides: 74 mg/dL (ref 0.0–149.0)
VLDL: 14.8 mg/dL (ref 0.0–40.0)

## 2021-08-24 NOTE — Patient Instructions (Signed)
-  Nice seeing you today!! ? ?-Lab work today; will notify you once results are available. ? ?-Remember your COVID booster, tdap and shingles vaccines at the pharmacy. ? ?-Remember to schedule your dental exam. ? ?-Schedule follow up in 6 months. ? ? ?

## 2021-08-24 NOTE — Progress Notes (Signed)
? ? ? ?Established Patient Office Visit ? ? ? ? ?This visit occurred during the SARS-CoV-2 public health emergency.  Safety protocols were in place, including screening questions prior to the visit, additional usage of staff PPE, and extensive cleaning of exam room while observing appropriate contact time as indicated for disinfecting solutions.  ? ? ?CC/Reason for Visit: Annual preventive exam and subsequent Medicare wellness visit ? ?HPI: Lindsay Brady is a 71 y.o. female who is coming in today for the above mentioned reasons. Past Medical History is significant for: chronic hypoxemic respiratory failure due to COPD, obstructive sleep apnea, hyperlipidemia, type 2 diabetes with neuropathy.  She is not on any medications for diabetes.  She also has a history of generalized anxiety disorder.  She is overdue for COVID booster, Tdap, shingles vaccines, overdue for breast, colon cancer screening as well as DEXA scan.  She is overdue for eye and dental care.  She feels well and has no acute complaints today. ? ? ?Past Medical/Surgical History: ?Past Medical History:  ?Diagnosis Date  ? Anxiety   ? Arthritis   ? Cholecystitis 07/2014  ? COPD (chronic obstructive pulmonary disease) ([place])   ? Diabetes mellitus without complication ([place])   ? type 2   ? Fracture 2013 ?  ? left leg  ? GERD (gastroesophageal reflux disease)   ? hx of gerd  ? Headache   ? Hx: UTI (urinary tract infection)   ? OA (osteoarthritis) of knee   ? Pneumonia   ? hx of viral pna years ago  ? Shortness of breath dyspnea   ? Sleep apnea   ? uses cpap  ? Urgency of urination   ? ? ?Past Surgical History:  ?Procedure Laterality Date  ? ABDOMINAL HYSTERECTOMY    ? CARPAL TUNNEL RELEASE    ? CHOLECYSTECTOMY N/A 07/19/2014  ? Procedure: LAPAROSCOPIC CHOLECYSTECTOMY WITH INTRAOPERATIVE CHOLANGIOGRAM;  Surgeon: Georganna Skeans, MD;  Location: [facility];  Service: General;  Laterality: N/A;  ? COLONOSCOPY  ?   2011  ? KNEE ARTHROSCOPY    ? TONSILLECTOMY     ? ? ?Social History: ? reports that she quit smoking about 25 years ago. Her smoking use included cigarettes. She has a 60.00 pack-year smoking history. She has never used smokeless tobacco. She reports that she does not drink alcohol and does not use drugs. ? ?Allergies: ?Allergies  ?Allergen Reactions  ? Bactrim [Sulfamethoxazole-Trimethoprim] Rash  ? Erythromycin Other (See Comments)  ?  Pain in stomach   ? Fosamax  [Alendronate Sodium] Cough  ?  After taking 1 pill, pt states that she had a cough.   ? ? ?Family History:  ?Family History  ?Problem Relation Age of Onset  ? Stroke Mother   ? Lung cancer Father   ?     smoked  ? Colon cancer Father   ? Cancer Paternal Grandmother   ? Cancer Paternal Grandfather   ? ? ? ?Current Outpatient Medications:  ?  acetaminophen (TYLENOL) 500 MG tablet, Take 500 mg by mouth every 6 (six) hours as needed., Disp: , Rfl:  ?  albuterol (VENTOLIN HFA) 108 (90 Base) MCG/ACT inhaler, Inhale 2 puffs into the lungs every 6 (six) hours as needed for wheezing or shortness of breath., Disp: 1 each, Rfl: 11 ?  chlorpheniramine (CHLOR-TRIMETON) 4 MG tablet, Take 4 mg by mouth at bedtime., Disp: , Rfl:  ?  Cholecalciferol (DIALYVITE VITAMIN D 5000 PO), Take 5,000 Units by mouth daily., Disp: , Rfl:  ?  escitalopram (LEXAPRO) 10 MG tablet, TAKE 1 TABLET BY MOUTH EVERY DAY, Disp: 90 tablet, Rfl: 0 ?  famotidine (PEPCID) 20 MG tablet, Take one After breakfast and supper, Disp: , Rfl:  ?  ibuprofen (ADVIL,MOTRIN) 800 MG tablet, Take 1 tablet (800 mg total) by mouth every 8 (eight) hours as needed., Disp: 90 tablet, Rfl: 0 ?  loratadine (ALLERGY) 10 MG tablet, Take 10 mg by mouth daily., Disp: , Rfl:  ?  Melatonin 10 MG TABS, Take 10 mg by mouth at bedtime., Disp: , Rfl:  ?  mirabegron ER (MYRBETRIQ) 50 MG TB24 tablet, Take 1 tablet (50 mg total) by mouth daily., Disp: 90 tablet, Rfl: 0 ?  oxyCODONE-acetaminophen (PERCOCET/ROXICET) 5-325 MG tablet, Take 1 tablet by mouth 4 (four) times daily.,  Disp: , Rfl:  ?  OXYGEN, 2lpm with sleep and 3lpm with exertion [facility], Disp: , Rfl:  ?  simvastatin (ZOCOR) 20 MG tablet, TAKE 1 TABLET BY MOUTH EVERYDAY AT BEDTIME, Disp: 90 tablet, Rfl: 0 ? ?Review of Systems:  ?Constitutional: Denies fever, chills, diaphoresis, appetite change and fatigue.  ?HEENT: Denies photophobia, eye pain, redness, hearing loss, ear pain, congestion, sore throat, rhinorrhea, sneezing, mouth sores, trouble swallowing, neck pain, neck stiffness and tinnitus.   ?Respiratory: Denies SOB, DOE, cough, chest tightness,  and wheezing.   ?Cardiovascular: Denies chest pain, palpitations and leg swelling.  ?Gastrointestinal: Denies nausea, vomiting, abdominal pain, diarrhea, constipation, blood in stool and abdominal distention.  ?Genitourinary: Denies dysuria, urgency, frequency, hematuria, flank pain and difficulty urinating.  ?Endocrine: Denies: hot or cold intolerance, sweats, changes in hair or nails, polyuria, polydipsia. ?Musculoskeletal: Denies myalgias, back pain, joint swelling, arthralgias and gait problem.  ?Skin: Denies pallor, rash and wound.  ?Neurological: Denies dizziness, seizures, syncope, weakness, light-headedness, numbness and headaches.  ?Hematological: Denies adenopathy. Easy bruising, personal or family bleeding history  ?Psychiatric/Behavioral: Denies suicidal ideation, mood changes, confusion, nervousness, sleep disturbance and agitation ? ? ? ?Physical Exam: ?Vitals:  ? 08/24/21 1349  ?BP: 110/74  ?Pulse: 70  ?Temp: 98.5 ?F (36.9 ?C)  ?TempSrc: Oral  ?SpO2: 97%  ?Weight: 242 lb 4.8 oz (109.9 kg)  ?Height: 5\' 4"  (1.626 m)  ? ? ?Body mass index is 41.59 kg/m?. ? ? ?Constitutional: NAD, calm, comfortable, obese ?Eyes: PERRL, lids and conjunctivae normal ?ENMT: Mucous membranes are moist. Posterior pharynx clear of any exudate or lesions. Normal dentition. Tympanic membrane is pearly white, no erythema or bulging. ?Neck: normal, supple, no masses, no thyromegaly ?Respiratory:  clear to auscultation bilaterally, no wheezing, no crackles. Normal respiratory effort. No accessory muscle use.  ?Cardiovascular: Regular rate and rhythm, no murmurs / rubs / gallops. No extremity edema. 2+ pedal pulses. No carotid bruits.  ?Abdomen: no tenderness, no masses palpated. No hepatosplenomegaly. Bowel sounds positive.  ?Musculoskeletal: no clubbing / cyanosis. No joint deformity upper and lower extremities. Good ROM, no contractures. Normal muscle tone.  ?Skin: no rashes, lesions, ulcers. No induration ?Neurologic: CN 2-12 grossly intact. Sensation intact, DTR normal. Strength 5/5 in all 4.  ?Psychiatric: Normal judgment and insight. Alert and oriented x 3. Normal mood.  ? ?Subsequent Medicare wellness visit ?  ?1. Risk factors, based on past  M,S,F -cardiovascular disease risk factors include age, obesity, history of diabetes, hyperlipidemia ?  ?2.  Physical activities: Sedentary ?  ?3.  Depression/mood: Stable, not depressed ?  ?4.  Hearing: No perceived issues ?  ?5.  ADL's: Independent in all ADLs ?  ?6.  Fall risk: Low fall risk ?  ?  7.  Home safety: No problems identified ?  ?8.  Height weight, and visual acuity: height and weight as above, vision: ? ?Vision Screening  ? Right eye Left eye Both eyes  ?Without correction 20/32 20/25 20/25   ?With correction     ? ?  ?9.  Counseling: Advise she update vaccination status, discussed lifestyle changes and many preventative exams that need to be done ?  ?10. Lab orders based on risk factors: Laboratory update will be reviewed ?  ?11. Referral : Ophthalmology, for mammogram, gastroenterology for colonoscopy ?  ?12. Care plan: Follow-up with me in 6 months ?  ?13. Cognitive assessment: No cognitive impairment ?  ?14. Screening: Patient provided with a written and personalized 5-10 year screening schedule in the AVS. yes ?  ?15. Provider List Update: PCP, pulmonology ? ?16. Advance Directives: Full code ? ? ?17. Opioids: Patient is not on any opioid  prescriptions and has no risk factors for a substance use disorder. ? ? ?Day Office Visit from 08/24/2021 in [place] at [facility]  ?PHQ-9 Total Score 4  ? ?  ? ? ? ?  06/28/2018  ? 10:27 AM 03/09/2019

## 2021-08-25 ENCOUNTER — Encounter: Payer: Self-pay | Admitting: Internal Medicine

## 2021-08-25 ENCOUNTER — Other Ambulatory Visit: Payer: Self-pay | Admitting: Internal Medicine

## 2021-08-25 DIAGNOSIS — R809 Proteinuria, unspecified: Secondary | ICD-10-CM

## 2021-08-25 MED ORDER — LISINOPRIL 5 MG PO TABS: 5.0000 mg | ORAL_TABLET | Freq: Every day | ORAL | 1 refills | Status: DC

## 2021-08-26 DIAGNOSIS — E119 Type 2 diabetes mellitus without complications: Secondary | ICD-10-CM | POA: Diagnosis not present

## 2021-08-26 DIAGNOSIS — G8929 Other chronic pain: Secondary | ICD-10-CM | POA: Diagnosis not present

## 2021-08-26 DIAGNOSIS — M25512 Pain in left shoulder: Secondary | ICD-10-CM | POA: Diagnosis not present

## 2021-08-26 DIAGNOSIS — E7800 Pure hypercholesterolemia, unspecified: Secondary | ICD-10-CM | POA: Diagnosis not present

## 2021-08-26 DIAGNOSIS — Z79899 Other long term (current) drug therapy: Secondary | ICD-10-CM | POA: Diagnosis not present

## 2021-08-28 DIAGNOSIS — Z79899 Other long term (current) drug therapy: Secondary | ICD-10-CM | POA: Diagnosis not present

## 2021-09-09 ENCOUNTER — Ambulatory Visit: Payer: Medicare Other | Admitting: Podiatry

## 2021-09-12 ENCOUNTER — Other Ambulatory Visit: Payer: Self-pay | Admitting: Internal Medicine

## 2021-09-12 DIAGNOSIS — N959 Unspecified menopausal and perimenopausal disorder: Secondary | ICD-10-CM

## 2021-09-14 DIAGNOSIS — G4733 Obstructive sleep apnea (adult) (pediatric): Secondary | ICD-10-CM | POA: Diagnosis not present

## 2021-09-22 ENCOUNTER — Ambulatory Visit: Payer: Medicare Other | Admitting: Podiatry

## 2021-09-22 ENCOUNTER — Encounter: Payer: Self-pay | Admitting: Podiatry

## 2021-09-22 DIAGNOSIS — M79675 Pain in left toe(s): Secondary | ICD-10-CM

## 2021-09-22 DIAGNOSIS — M79674 Pain in right toe(s): Secondary | ICD-10-CM

## 2021-09-22 DIAGNOSIS — E1142 Type 2 diabetes mellitus with diabetic polyneuropathy: Secondary | ICD-10-CM

## 2021-09-22 DIAGNOSIS — B351 Tinea unguium: Secondary | ICD-10-CM | POA: Diagnosis not present

## 2021-09-22 NOTE — Progress Notes (Signed)
?  Subjective:  ?Patient ID: Lindsay Brady, female    DOB: 1951/05/27,   MRN: [id number] ? ?Chief Complaint  ?Patient presents with  ? Nail Problem  ?   ?Routine foot care / possible fungal infection  ? ? ?71 y.o. female presents for concern of thickened elongated and painful nails that are difficult to trim. Requesting to have them trimmed today. Denies any burning or tingling in her feet.  Concerned about swelling previously in second toe and defomrity. Patient is diabetic and last A1c was 6.3.  ? . Denies any other pedal complaints. Denies n/v/f/c.  ? ?PCP: Chaya Jan MD  ? ?Past Medical History:  ?Diagnosis Date  ? Anxiety   ? Arthritis   ? Cholecystitis 07/2014  ? COPD (chronic obstructive pulmonary disease) ([facility])   ? Diabetes mellitus without complication ([facility])   ? type 2   ? Fracture 2013 ?  ? left leg  ? GERD (gastroesophageal reflux disease)   ? hx of gerd  ? Headache   ? Hx: UTI (urinary tract infection)   ? OA (osteoarthritis) of knee   ? Pneumonia   ? hx of viral pna years ago  ? Shortness of breath dyspnea   ? Sleep apnea   ? uses cpap  ? Urgency of urination   ? ? ?Objective:  ?Physical Exam: ?Vascular: DP/PT pulses 2/4 bilateral. CFT <3 seconds. Absent hair growth on digits. Edema noted to bilateral lower extremities. Xerosis noted bilaterally.  ?Skin. No lacerations or abrasions bilateral feet. Nails 1-5 bilateral  are thickened discolored and elongated with subungual debris.  ?Musculoskeletal: MMT 5/5 bilateral lower extremities in DF, PF, Inversion and Eversion. Deceased ROM in DF of ankle joint.  ?Neurological: Sensation intact to light touch. Protective sensation diminished bilateral.  ?  ? ?Assessment:  ? ?1. Type 2 diabetes mellitus with diabetic polyneuropathy, without long-term current use of insulin (HCC)   ?2. Pain due to onychomycosis of toenails of both feet   ? ? ? ?Plan:  ?Patient was evaluated and treated and all questions answered. ?-Discussed and educated patient on  diabetic foot care, especially with  ?regards to the vascular, neurological and musculoskeletal systems.  ?-Stressed the importance of good glycemic control and the detriment of not  ?controlling glucose levels in relation to the foot. ?-Discussed supportive shoes at all times and checking feet regularly.  ?-Discussed clubbing of second toes. No concern today will keep an eye on them.  ?-Mechanically debrided all nails 1-5 bilateral using sterile nail nipper and filed with dremel without incident  ?-Answered all patient questions ?-Patient to return  in 3 months for at risk foot care ?-Patient advised to call the office if any problems or questions arise in the meantime. ? ? ?Louann Sjogren, DPM  ? ? ?

## 2021-09-23 DIAGNOSIS — Z79899 Other long term (current) drug therapy: Secondary | ICD-10-CM | POA: Diagnosis not present

## 2021-09-23 DIAGNOSIS — E7800 Pure hypercholesterolemia, unspecified: Secondary | ICD-10-CM | POA: Diagnosis not present

## 2021-09-23 DIAGNOSIS — M25512 Pain in left shoulder: Secondary | ICD-10-CM | POA: Diagnosis not present

## 2021-09-23 DIAGNOSIS — E119 Type 2 diabetes mellitus without complications: Secondary | ICD-10-CM | POA: Diagnosis not present

## 2021-09-23 DIAGNOSIS — G8929 Other chronic pain: Secondary | ICD-10-CM | POA: Diagnosis not present

## 2021-09-25 DIAGNOSIS — Z79899 Other long term (current) drug therapy: Secondary | ICD-10-CM | POA: Diagnosis not present

## 2021-10-06 ENCOUNTER — Telehealth (HOSPITAL_COMMUNITY): Payer: Self-pay | Admitting: Internal Medicine

## 2021-10-06 NOTE — Telephone Encounter (Signed)
I called patient to schedule echocardiogram and she will call me back at a later date to reschedule due to she has several things coming up at the end of the month that will cause a conflict. Order will be removed from the active echo wq. When patient calls back to reschedule we will reinstate the order. Thank you.  ?

## 2021-10-14 ENCOUNTER — Encounter: Payer: Self-pay | Admitting: Gastroenterology

## 2021-10-15 ENCOUNTER — Telehealth: Payer: Self-pay | Admitting: *Deleted

## 2021-10-15 NOTE — Telephone Encounter (Signed)
Called pt regarding her upcoming PV appt. Appeared in chart review that pt was on home O2 and had an upcoming ECHO ordered. Pt is not currently using her O2 because she ran out but stated "I need to get a new tank, I'm supposed to be using it all the time." Also confirmed that she has an appt. In June for ECHO. Let pt know she would have to be done at the hospital due to O2 use but asked her to call back and schedule an OV after her ECHO is done and results are received. Pt stated an understanding and agreed to call and schedule OV once testing complete. PV and colon cancelled for now.

## 2021-10-21 DIAGNOSIS — E559 Vitamin D deficiency, unspecified: Secondary | ICD-10-CM | POA: Diagnosis not present

## 2021-10-21 DIAGNOSIS — G8929 Other chronic pain: Secondary | ICD-10-CM | POA: Diagnosis not present

## 2021-10-21 DIAGNOSIS — E7800 Pure hypercholesterolemia, unspecified: Secondary | ICD-10-CM | POA: Diagnosis not present

## 2021-10-21 DIAGNOSIS — E119 Type 2 diabetes mellitus without complications: Secondary | ICD-10-CM | POA: Diagnosis not present

## 2021-10-21 DIAGNOSIS — M25512 Pain in left shoulder: Secondary | ICD-10-CM | POA: Diagnosis not present

## 2021-10-21 DIAGNOSIS — Z79899 Other long term (current) drug therapy: Secondary | ICD-10-CM | POA: Diagnosis not present

## 2021-11-03 ENCOUNTER — Ambulatory Visit (HOSPITAL_COMMUNITY): Payer: Medicare Other | Attending: Internal Medicine

## 2021-11-03 DIAGNOSIS — R0609 Other forms of dyspnea: Secondary | ICD-10-CM | POA: Insufficient documentation

## 2021-11-03 LAB — ECHOCARDIOGRAM COMPLETE
AR max vel: 2.17 cm2
AV Peak grad: 17.4 mmHg
Ao pk vel: 2.09 m/s
Area-P 1/2: 3.05 cm2
S' Lateral: 2.8 cm

## 2021-11-06 ENCOUNTER — Telehealth: Payer: Self-pay | Admitting: Internal Medicine

## 2021-11-06 NOTE — Telephone Encounter (Signed)
Tanda Rockers, MD  11/04/2021  4:32 AM EDT     Call patient :  Study shows no new findings that would explain or cause much  doe / copy to PCP     ATC patient. LMTCB

## 2021-11-06 NOTE — Telephone Encounter (Signed)
Patient returned call.  Spoke with patient and gave her results and she voiced relief and understanding.  Confirmed PCP and will route result note over to PCP.  Nothing further is needed at this time.

## 2021-11-07 ENCOUNTER — Other Ambulatory Visit: Payer: Self-pay | Admitting: Internal Medicine

## 2021-11-07 DIAGNOSIS — F419 Anxiety disorder, unspecified: Secondary | ICD-10-CM

## 2021-11-07 DIAGNOSIS — E785 Hyperlipidemia, unspecified: Secondary | ICD-10-CM

## 2021-11-09 NOTE — Progress Notes (Signed)
Spoke with pt and notified of results per Dr. Wert. Pt verbalized understanding and denied any questions. 

## 2021-11-16 ENCOUNTER — Encounter: Payer: Medicare Other | Admitting: Gastroenterology

## 2021-11-18 DIAGNOSIS — E559 Vitamin D deficiency, unspecified: Secondary | ICD-10-CM | POA: Diagnosis not present

## 2021-11-18 DIAGNOSIS — M25512 Pain in left shoulder: Secondary | ICD-10-CM | POA: Diagnosis not present

## 2021-11-18 DIAGNOSIS — Z79899 Other long term (current) drug therapy: Secondary | ICD-10-CM | POA: Diagnosis not present

## 2021-11-18 DIAGNOSIS — G8929 Other chronic pain: Secondary | ICD-10-CM | POA: Diagnosis not present

## 2021-11-18 DIAGNOSIS — I10 Essential (primary) hypertension: Secondary | ICD-10-CM | POA: Diagnosis not present

## 2021-11-18 DIAGNOSIS — E7800 Pure hypercholesterolemia, unspecified: Secondary | ICD-10-CM | POA: Diagnosis not present

## 2021-11-18 DIAGNOSIS — E119 Type 2 diabetes mellitus without complications: Secondary | ICD-10-CM | POA: Diagnosis not present

## 2021-11-25 ENCOUNTER — Encounter: Payer: Self-pay | Admitting: Nurse Practitioner

## 2021-11-25 ENCOUNTER — Ambulatory Visit: Payer: Medicare Other | Admitting: Nurse Practitioner

## 2021-11-25 ENCOUNTER — Ambulatory Visit (INDEPENDENT_AMBULATORY_CARE_PROVIDER_SITE_OTHER): Payer: Medicare Other

## 2021-11-25 VITALS — BP 126/70 | HR 79 | Temp 98.5°F | Ht 64.0 in | Wt 244.0 lb

## 2021-11-25 DIAGNOSIS — J849 Interstitial pulmonary disease, unspecified: Secondary | ICD-10-CM

## 2021-11-25 DIAGNOSIS — J8410 Pulmonary fibrosis, unspecified: Secondary | ICD-10-CM | POA: Diagnosis not present

## 2021-11-25 DIAGNOSIS — J9611 Chronic respiratory failure with hypoxia: Secondary | ICD-10-CM | POA: Diagnosis not present

## 2021-11-25 DIAGNOSIS — R059 Cough, unspecified: Secondary | ICD-10-CM | POA: Diagnosis not present

## 2021-11-25 DIAGNOSIS — R058 Other specified cough: Secondary | ICD-10-CM

## 2021-11-25 DIAGNOSIS — J209 Acute bronchitis, unspecified: Secondary | ICD-10-CM | POA: Insufficient documentation

## 2021-11-25 MED ORDER — FLUTICASONE PROPIONATE 50 MCG/ACT NA SUSP: 2.0000 | Freq: Every day | NASAL | 2 refills | Status: DC

## 2021-11-25 MED ORDER — BENZONATATE 200 MG PO CAPS: 200.0000 mg | ORAL_CAPSULE | Freq: Three times a day (TID) | ORAL | 1 refills | Status: DC | PRN

## 2021-11-25 NOTE — Progress Notes (Signed)
@Patient  ID: Lindsay Brady, female    DOB: [birth year], 71 y.o.   MRN: [id number]  Chief Complaint  Patient presents with   Follow-up    About 2 weeks ago, she had sore throat, laryngitis, dry cough, and had some clear coming up for a few days. She felt that her glands are sore. No fever, body aches, or chills, but some fatigue,.     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  She is a patient of Dr. Gustavus Bryant and last seen in office 08/05/2021.  Past medical history significant for right bundle branch block, OSA on CPAP, DM, obesity, anxiety.  TEST/EVENTS:   08/05/2021: OV with Dr. Melvyn Novas.  Not on any maintenance inhalers.  Rarely uses [place].  Reported that she has been sleeping elevated but still feels like she is choking during the night.  Prescribed oxygen but not using except at night.  Walking oximetry performed without desaturation.  Ordered echocardiogram for concern for possible AS given heart murmur and symptoms.  Suspected that obesity was contributing to DOE and GERD symptoms.  Work-up was relatively unremarkable.  11/25/2021: Today-acute Patient presents today for acute visit.  She reports that around 2 weeks ago, she developed a sore throat, hoarse voice, increased cough, and fatigue.  She also had nasal congestion and drainage at the time and felt like her lymph nodes in her neck were sore.  She describes her cough as occasionally productive with clear sputum, otherwise it was dry.  She denies fevers, hemoptysis, difficulty swallowing, chills, wheezing, increased shortness of breath.  She did test for COVID at home which was negative.  Today, she reports feeling better; however she still has a persistent cough.  It is usually dry but occasionally she will produce some clear sputum.  She rarely requires her albuterol.  She uses  chlorpheniramine occasionally at bedtime.  She takes Pepcid twice a day for GERD with good control and Claritin daily for allergies.  Allergies  Allergen Reactions   Bactrim [Sulfamethoxazole-Trimethoprim] Rash   Erythromycin Other (See Comments)    Pain in stomach    Fosamax  [Alendronate Sodium] Cough    After taking 1 pill, pt states that she had a cough.     Immunization History  Administered Date(s) Administered   Fluad Quad(high Dose 65+) 03/09/2019, 01/14/2021   Influenza Whole 02/29/2016   Influenza, High Dose Seasonal PF 03/10/2016, 03/15/2017, 03/15/2017, 03/27/2018, 04/15/2020   Influenza, Seasonal, Injecte, Preservative Fre 03/12/2013, 03/19/2014   Influenza,inj,Quad PF,6+ Mos 03/10/2016   Influenza,trivalent, recombinat, inj, PF 05/04/2012   Moderna Sars-Covid-2 Vaccination 06/26/2019, 07/24/2019, 04/15/2020, 09/23/2020   Pneumococcal Conjugate-13 06/22/2018   Pneumococcal Polysaccharide-23 07/24/2010   Td 01/09/2002   Tdap 12/30/2011   Zoster, Live 08/23/2011    Past Medical History:  Diagnosis Date   Anxiety    Arthritis    Cholecystitis 07/2014   COPD (chronic obstructive pulmonary disease) ([place])    Diabetes mellitus without complication ([place])    type 2    Fracture 2013 ?   left leg   GERD (gastroesophageal reflux disease)    hx of gerd   Headache    Hx: UTI (urinary tract infection)    OA (osteoarthritis) of knee    Pneumonia    hx of viral pna years ago   Shortness of breath dyspnea    Sleep apnea    uses cpap  Urgency of urination     Tobacco History: Social History   Tobacco Use  Smoking Status Former   Packs/day: 2.00   Years: 30.00   Total pack years: 60.00   Types: Cigarettes   Quit date: 05/31/1996   Years since quitting: 25.5  Smokeless Tobacco Never   Counseling given: Not Answered   Outpatient Medications Prior to Visit  Medication Sig Dispense Refill   acetaminophen (TYLENOL) 500 MG tablet Take 500 mg by mouth every 6 (six)  hours as needed.     albuterol (VENTOLIN HFA) 108 (90 Base) MCG/ACT inhaler Inhale 2 puffs into the lungs every 6 (six) hours as needed for wheezing or shortness of breath. 1 each 11   chlorpheniramine (CHLOR-TRIMETON) 4 MG tablet Take 4 mg by mouth at bedtime.     Cholecalciferol (DIALYVITE VITAMIN D 5000 PO) Take 5,000 Units by mouth daily.     escitalopram (LEXAPRO) 10 MG tablet TAKE 1 TABLET BY MOUTH EVERY DAY 90 tablet 1   famotidine (PEPCID) 20 MG tablet Take one After breakfast and supper     ibuprofen (ADVIL,MOTRIN) 800 MG tablet Take 1 tablet (800 mg total) by mouth every 8 (eight) hours as needed. 90 tablet 0   lisinopril (ZESTRIL) 5 MG tablet Take 1 tablet (5 mg total) by mouth daily. 90 tablet 1   loratadine (ALLERGY) 10 MG tablet Take 10 mg by mouth daily.     Melatonin 10 MG TABS Take 10 mg by mouth at bedtime.     MYRBETRIQ 50 MG TB24 tablet TAKE 1 TABLET BY MOUTH EVERY DAY 90 tablet 1   oxyCODONE-acetaminophen (PERCOCET/ROXICET) 5-325 MG tablet Take 1 tablet by mouth 4 (four) times daily.     OXYGEN 2lpm with sleep and 3lpm with exertion AHC     simvastatin (ZOCOR) 20 MG tablet TAKE 1 TABLET BY MOUTH EVERYDAY AT BEDTIME 90 tablet 1   No facility-administered medications prior to visit.     Review of Systems:   Constitutional: No weight loss or gain, night sweats, fevers, chills. +Fatigue (improved) HEENT: No headaches, difficulty swallowing, tooth/dental problems. No sneezing, itching, ear ache +nasal congestion/drainage, sore throat (resolved), hoarse voice CV:  No chest pain, orthopnea, PND, swelling in lower extremities, anasarca, dizziness, palpitations, syncope Resp: + Increase in chronic cough.  No shortness of breath with exertion or at rest.  No hemoptysis. No wheezing.  No chest wall deformity Skin: No rash, lesions, ulcerations MSK:  No joint pain or swelling.  No decreased range of motion.  No back pain. Neuro: No dizziness or lightheadedness.  Psych: No  depression or anxiety. Mood stable.     Physical Exam:  BP 126/70 (BP Location: Right Arm, Cuff Size: Large)   Pulse 79   Temp 98.5 F (36.9 C) (Oral)   Ht 5\' 4"  (1.626 m)   Wt 244 lb (110.7 kg)   SpO2 97%   BMI 41.88 kg/m   GEN: Pleasant, interactive, well-appearing; obese; in no acute distress HEENT:  Normocephalic and atraumatic. EACs patent bilaterally. TM pearly gray with present light reflex bilaterally. PERRLA. Sclera white. Nasal turbinates erythematous, moist and patent bilaterally.  Clear rhinorrhea present. Oropharynx erythematous and moist, without exudate or edema. No lesions, ulcerations NECK:  Supple w/ fair ROM. No JVD present. Normal carotid impulses w/o bruits. Thyroid symmetrical with no goiter or nodules palpated. No lymphadenopathy.   CV: RRR, no m/r/g, no peripheral edema. Pulses intact, +2 bilaterally. No cyanosis, pallor or clubbing. PULMONARY:  Unlabored, regular breathing.  Clear bilaterally A&P w/o wheezes/rales/rhonchi.  Bronchitic cough.  No accessory muscle use. No dullness to percussion. GI: BS present and normoactive. Soft, non-tender to palpation. No organomegaly or masses detected. No CVA tenderness. MSK: No erythema, warmth or tenderness. Cap refil <2 sec all extrem. No deformities or joint swelling noted.  Neuro: A/Ox3. No focal deficits noted.   Skin: Warm, no lesions or rashe Psych: Normal affect and behavior. Judgement and thought content appropriate.     Lab Results:  CBC    Component Value Date/Time   WBC 6.9 08/24/2021 1419   RBC 4.17 08/24/2021 1419   HGB 13.4 08/24/2021 1419   HCT 39.5 08/24/2021 1419   PLT 219.0 08/24/2021 1419   MCV 94.8 08/24/2021 1419   MCH 31.7 07/21/2014 0430   MCHC 33.9 08/24/2021 1419   RDW 13.9 08/24/2021 1419   LYMPHSABS 2.7 08/24/2021 1419   MONOABS 0.5 08/24/2021 1419   EOSABS 0.1 08/24/2021 1419   BASOSABS 0.0 08/24/2021 1419    BMET    Component Value Date/Time   NA 137 08/24/2021 1419   K  4.0 08/24/2021 1419   CL 102 08/24/2021 1419   CO2 28 08/24/2021 1419   GLUCOSE 101 (H) 08/24/2021 1419   BUN 20 08/24/2021 1419   CREATININE 0.80 08/24/2021 1419   CALCIUM 9.8 08/24/2021 1419   GFRNONAA 68 (L) 07/21/2014 0430   GFRAA 79 (L) 07/21/2014 0430    BNP No results found for: "BNP"   Imaging:  DG Chest 2 View  Result Date: 11/25/2021 CLINICAL DATA:  Worsening cough EXAM: CHEST - 2 VIEW COMPARISON:  01/20/2021 FINDINGS: Cardiac size is within normal limits. Increased interstitial markings are noted in the both lungs, more so in the upper lung fields. This finding most likely suggests interstitial lung disease and fibrosis. No focal pulmonary consolidation is seen. There is no pleural effusion or pneumothorax. Marked degenerative changes are noted in the left shoulder. 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 There are no new focal infiltrates. There is no pleural effusion. Electronically Signed   By: Elmer Picker M.D.   On: 11/25/2021 17:40   ECHOCARDIOGRAM COMPLETE  Result Date: 11/03/2021    ECHOCARDIOGRAM REPORT   Patient Name:   KATHI BERRIER Date of Exam: 11/03/2021 Medical Rec #:  [id number]           Height:       64.0 in Accession #:    [id number]          Weight:       242.3 lb Date of Birth:  Oct 16, 1950           BSA:          2.123 m Patient Age:    19 years            BP:           116/73 mmHg Patient Gender: F                   HR:           68 bpm. Exam Location:  [place] Procedure: 2D Echo, Cardiac Doppler and Color Doppler Indications:    Dyspnea  History:        Patient has prior history of Echocardiogram examinations, most                 recent 02/11/2016. COPD; Risk Factors:Diabetes.  Sonographer:    Jefferey Pica Referring Phys: [place]  IMPRESSIONS  1. Left ventricular ejection fraction, by estimation, is 60 to 65%. The left ventricle has normal function. The left ventricle has no regional wall motion  abnormalities. Left ventricular diastolic parameters are consistent with Grade I diastolic dysfunction (impaired relaxation).  2. Right ventricular systolic function is normal. The right ventricular size is normal. There is normal pulmonary artery systolic pressure. The estimated right ventricular systolic pressure is 99991111 mmHg.  3. The mitral valve is normal in structure. No evidence of mitral valve regurgitation. No evidence of mitral stenosis.  4. The aortic valve was not well visualized. Aortic valve regurgitation is not visualized.  5. The inferior vena cava is dilated in size with >50% respiratory variability, suggesting right atrial pressure of 8 mmHg. Comparison(s): RVSP improved from 2017 report (unable to load images). FINDINGS  Left Ventricle: Left ventricular ejection fraction, by estimation, is 60 to 65%. The left ventricle has normal function. The left ventricle has no regional wall motion abnormalities. The left ventricular internal cavity size was normal in size. There is  no left ventricular hypertrophy. Left ventricular diastolic parameters are consistent with Grade I diastolic dysfunction (impaired relaxation). Right Ventricle: The right ventricular size is normal. No increase in right ventricular wall thickness. Right ventricular systolic function is normal. There is normal pulmonary artery systolic pressure. The tricuspid regurgitant velocity is 2.44 m/s, and  with an assumed right atrial pressure of 8 mmHg, the estimated right ventricular systolic pressure is 99991111 mmHg. Left Atrium: Left atrial size was normal in size. Right Atrium: Right atrial size was normal in size. Pericardium: There is no evidence of pericardial effusion. Mitral Valve: The mitral valve is normal in structure. No evidence of mitral valve regurgitation. No evidence of mitral valve stenosis. Tricuspid Valve: The tricuspid valve is normal in structure. Tricuspid valve regurgitation is mild . No evidence of tricuspid stenosis.  Aortic Valve: The aortic valve was not well visualized. Aortic valve regurgitation is not visualized. Aortic valve peak gradient measures 17.4 mmHg. Pulmonic Valve: The pulmonic valve was normal in structure. Pulmonic valve regurgitation is not visualized. No evidence of pulmonic stenosis. Aorta: The aortic root and ascending aorta are structurally normal, with no evidence of dilitation. Pulmonary Artery: The pulmonary artery is of normal size. Venous: The inferior vena cava is dilated in size with greater than 50% respiratory variability, suggesting right atrial pressure of 8 mmHg. IAS/Shunts: No atrial level shunt detected by color flow Doppler.  LEFT VENTRICLE PLAX 2D LVIDd:         4.80 cm   Diastology LVIDs:         2.80 cm   LV e' medial:    7.18 cm/s LV PW:         1.10 cm   LV E/e' medial:  8.7 LV IVS:        1.00 cm   LV e' lateral:   11.30 cm/s LVOT diam:     2.00 cm   LV E/e' lateral: 5.5 LV SV:         102 LV SV Index:   48 LVOT Area:     3.14 cm  RIGHT VENTRICLE            IVC RV Basal diam:  3.20 cm    IVC diam: 2.20 cm RV Mid diam:    3.60 cm RV S prime:     9.57 cm/s TAPSE (M-mode): 1.7 cm LEFT ATRIUM             Index  RIGHT ATRIUM           Index LA diam:        4.00 cm 1.88 cm/m   RA Area:     16.90 cm LA Vol (A2C):   45.3 ml 21.34 ml/m  RA Volume:   46.80 ml  22.05 ml/m LA Vol (A4C):   36.3 ml 17.10 ml/m LA Biplane Vol: 41.5 ml 19.55 ml/m  AORTIC VALVE                  PULMONIC VALVE AV Area (Vmax): 2.17 cm      PV Vmax:       0.92 m/s AV Vmax:        208.50 cm/s   PV Peak grad:  3.4 mmHg AV Peak Grad:   17.4 mmHg LVOT Vmax:      144.00 cm/s LVOT Vmean:     101.000 cm/s LVOT VTI:       0.324 m  AORTA Ao Root diam: 2.90 cm Ao Asc diam:  3.20 cm MITRAL VALVE               TRICUSPID VALVE MV Area (PHT): 3.05 cm    TR Peak grad:   23.8 mmHg MV Decel Time: 249 msec    TR Vmax:        244.00 cm/s MV E velocity: 62.40 cm/s MV A velocity: 74.40 cm/s  SHUNTS MV E/A ratio:  0.84         Systemic VTI:  0.32 m                            Systemic Diam: 2.00 cm Riley Lam MD Electronically signed by Riley Lam MD Signature Date/Time: 11/03/2021/6:39:19 PM    Final          Latest Ref Rng & Units 05/18/2016   10:56 AM  PFT Results  FVC-Pre L 2.07   FVC-Predicted Pre % 63   FVC-Post L 2.09   FVC-Predicted Post % 63   Pre FEV1/FVC % % 90   Post FEV1/FCV % % 91   FEV1-Pre L 1.87   FEV1-Predicted Pre % 74   FEV1-Post L 1.90   DLCO uncorrected ml/min/mmHg 13.53   DLCO UNC% % 52   DLVA Predicted % 98   TLC L 2.98   TLC % Predicted % 57   RV % Predicted % 38     No results found for: "NITRICOXIDE"      Assessment & Plan:   Acute bronchitis Acute bronchitis related to viral illness.  She did test negative for COVID at home.  Was never tested for anything else.  Given the timeframe and improvement, no need to swab again today.  We will check CXR to rule out superimposed infection.  Otherwise treat her with prednisone burst and supportive care measures.  Patient Instructions  Continue Albuterol inhaler 2 puffs every 6 hours as needed for shortness of breath or wheezing. Notify if symptoms persist despite rescue inhaler/neb use. Continue chlorpheniramine 4 mg at bedtime for cough Continue pepcid 10 mg Twice daily  Continue loratidine 10 mg daily  Continue supplemental oxygen 2 L/min for goal greater than 88 to 90%  Delsym 2 tsp Twice daily for cough Tessalon perles 1 capsule Three times a day for cough  Prednisone 40 mg daily for 5 days. Take in AM with food.   Suck on sugar free hard candies or non-menthol candies. Frequent sips  of water, especially with urge to cough. Avoid throat clearing   Follow up as scheduled with Dr. Melvyn Novas. If symptoms do not improve or worsen, please contact office for sooner follow up or seek emergency care.     ILD (interstitial lung disease) ([facility])  c/w NSIP p macrodantin exp Symptoms seem consistent with viral  illness and associated acute bronchitis.  If no improvement in her cough, will plan to perform further work-up with repeat imaging to ensure no progression of disease.  Continue as needed albuterol.  Chronic respiratory failure with hypoxia ([facility]) Has not required oxygen recently.  She does monitor it at home and has not noticed levels below 90%.  She was 97% on room air today.  She does wear supplemental oxygen at night.  Continue 2 L/min for goal greater than 88 to 90%.   I spent 35 minutes of dedicated to the care of this patient on the date of this encounter to include pre-visit review of records, face-to-face time with the patient discussing conditions above, post visit ordering of testing, clinical documentation with the electronic health record, making appropriate referrals as documented, and communicating necessary findings to members of the patients care team.  Clayton Bibles, NP 11/25/2021  Pt aware and understands NP's role.

## 2021-11-25 NOTE — Patient Instructions (Addendum)
Continue Albuterol inhaler 2 puffs every 6 hours as needed for shortness of breath or wheezing. Notify if symptoms persist despite rescue inhaler/neb use. Continue chlorpheniramine 4 mg at bedtime for cough Continue pepcid 10 mg Twice daily  Continue loratidine 10 mg daily  Continue supplemental oxygen 2 L/min for goal greater than 88 to 90%  Delsym 2 tsp Twice daily for cough Tessalon perles 1 capsule Three times a day for cough  Prednisone 40 mg daily for 5 days. Take in AM with food.   Suck on sugar free hard candies or non-menthol candies. Frequent sips of water, especially with urge to cough. Avoid throat clearing   Follow up as scheduled with Lindsay Brady. If symptoms do not improve or worsen, please contact office for sooner follow up or seek emergency care.

## 2021-11-25 NOTE — Assessment & Plan Note (Signed)
Symptoms seem consistent with viral illness and associated acute bronchitis.  If no improvement in her cough, will plan to perform further work-up with repeat imaging to ensure no progression of disease.  Continue as needed albuterol.

## 2021-11-25 NOTE — Assessment & Plan Note (Addendum)
Acute bronchitis related to viral illness.  She did test negative for COVID at home.  Was never tested for anything else.  Given the timeframe and improvement, no need to swab again today.  We will check CXR to rule out superimposed infection.  Otherwise treat her with prednisone burst and supportive care measures.  Patient Instructions  Continue Albuterol inhaler 2 puffs every 6 hours as needed for shortness of breath or wheezing. Notify if symptoms persist despite rescue inhaler/neb use. Continue chlorpheniramine 4 mg at bedtime for cough Continue pepcid 10 mg Twice daily  Continue loratidine 10 mg daily  Continue supplemental oxygen 2 L/min for goal greater than 88 to 90%  Delsym 2 tsp Twice daily for cough Tessalon perles 1 capsule Three times a day for cough  Prednisone 40 mg daily for 5 days. Take in AM with food.   Suck on sugar free hard candies or non-menthol candies. Frequent sips of water, especially with urge to cough. Avoid throat clearing   Follow up as scheduled with Dr. Sherene Sires. If symptoms do not improve or worsen, please contact office for sooner follow up or seek emergency care.

## 2021-11-25 NOTE — Assessment & Plan Note (Signed)
Has not required oxygen recently.  She does monitor it at home and has not noticed levels below 90%.  She was 97% on room air today.  She does wear supplemental oxygen at night.  Continue 2 L/min for goal greater than 88 to 90%.

## 2021-11-26 ENCOUNTER — Telehealth: Payer: Self-pay | Admitting: Nurse Practitioner

## 2021-11-26 MED ORDER — PREDNISONE 20 MG PO TABS: 40.0000 mg | ORAL_TABLET | Freq: Every day | ORAL | 0 refills | Status: DC

## 2021-11-26 NOTE — Telephone Encounter (Signed)
Patient Instructions  Continue Albuterol inhaler 2 puffs every 6 hours as needed for shortness of breath or wheezing. Notify if symptoms persist despite rescue inhaler/neb use. Continue chlorpheniramine 4 mg at bedtime for cough Continue pepcid 10 mg Twice daily  Continue loratidine 10 mg daily  Continue supplemental oxygen 2 L/min for goal greater than 88 to 90%   Delsym 2 tsp Twice daily for cough Tessalon perles 1 capsule Three times a day for cough  Prednisone 40 mg daily for 5 days. Take in AM with food.     Called and spoke with pt letting her know that I was sending Rx for prednisone to pharmacy for her as it seemed to have gotten missed during OV. Pt verbalized understanding. Rx for prednisone sent to preferred pharmacy. Nothing  further needed.

## 2021-11-26 NOTE — Progress Notes (Signed)
Please notify patient no evidence of pneumonia or acute process on CXR. Thanks!

## 2021-12-14 DIAGNOSIS — G4733 Obstructive sleep apnea (adult) (pediatric): Secondary | ICD-10-CM | POA: Diagnosis not present

## 2021-12-16 DIAGNOSIS — M25512 Pain in left shoulder: Secondary | ICD-10-CM | POA: Diagnosis not present

## 2021-12-16 DIAGNOSIS — G8929 Other chronic pain: Secondary | ICD-10-CM | POA: Diagnosis not present

## 2021-12-16 DIAGNOSIS — Z79899 Other long term (current) drug therapy: Secondary | ICD-10-CM | POA: Diagnosis not present

## 2021-12-16 DIAGNOSIS — E559 Vitamin D deficiency, unspecified: Secondary | ICD-10-CM | POA: Diagnosis not present

## 2021-12-16 DIAGNOSIS — I10 Essential (primary) hypertension: Secondary | ICD-10-CM | POA: Diagnosis not present

## 2021-12-16 DIAGNOSIS — M545 Low back pain, unspecified: Secondary | ICD-10-CM | POA: Diagnosis not present

## 2021-12-16 DIAGNOSIS — E119 Type 2 diabetes mellitus without complications: Secondary | ICD-10-CM | POA: Diagnosis not present

## 2021-12-16 DIAGNOSIS — R0602 Shortness of breath: Secondary | ICD-10-CM | POA: Diagnosis not present

## 2021-12-16 DIAGNOSIS — Z1211 Encounter for screening for malignant neoplasm of colon: Secondary | ICD-10-CM | POA: Diagnosis not present

## 2021-12-16 DIAGNOSIS — G4700 Insomnia, unspecified: Secondary | ICD-10-CM | POA: Diagnosis not present

## 2021-12-16 DIAGNOSIS — E7800 Pure hypercholesterolemia, unspecified: Secondary | ICD-10-CM | POA: Diagnosis not present

## 2021-12-16 DIAGNOSIS — Z1231 Encounter for screening mammogram for malignant neoplasm of breast: Secondary | ICD-10-CM | POA: Diagnosis not present

## 2021-12-29 ENCOUNTER — Ambulatory Visit: Payer: Medicare Other | Admitting: Podiatry

## 2021-12-30 ENCOUNTER — Ambulatory Visit: Payer: Medicare Other | Admitting: Podiatry

## 2022-01-13 ENCOUNTER — Encounter: Payer: Self-pay | Admitting: Nurse Practitioner

## 2022-01-13 DIAGNOSIS — G4700 Insomnia, unspecified: Secondary | ICD-10-CM | POA: Diagnosis not present

## 2022-01-13 DIAGNOSIS — Z1211 Encounter for screening for malignant neoplasm of colon: Secondary | ICD-10-CM | POA: Diagnosis not present

## 2022-01-13 DIAGNOSIS — Z79899 Other long term (current) drug therapy: Secondary | ICD-10-CM | POA: Diagnosis not present

## 2022-01-13 DIAGNOSIS — M25512 Pain in left shoulder: Secondary | ICD-10-CM | POA: Diagnosis not present

## 2022-01-13 DIAGNOSIS — R0602 Shortness of breath: Secondary | ICD-10-CM | POA: Diagnosis not present

## 2022-01-13 DIAGNOSIS — Z1231 Encounter for screening mammogram for malignant neoplasm of breast: Secondary | ICD-10-CM | POA: Diagnosis not present

## 2022-01-13 DIAGNOSIS — I10 Essential (primary) hypertension: Secondary | ICD-10-CM | POA: Diagnosis not present

## 2022-01-13 DIAGNOSIS — E119 Type 2 diabetes mellitus without complications: Secondary | ICD-10-CM | POA: Diagnosis not present

## 2022-01-13 DIAGNOSIS — E7800 Pure hypercholesterolemia, unspecified: Secondary | ICD-10-CM | POA: Diagnosis not present

## 2022-01-13 DIAGNOSIS — M545 Low back pain, unspecified: Secondary | ICD-10-CM | POA: Diagnosis not present

## 2022-01-13 DIAGNOSIS — E559 Vitamin D deficiency, unspecified: Secondary | ICD-10-CM | POA: Diagnosis not present

## 2022-01-13 DIAGNOSIS — G8929 Other chronic pain: Secondary | ICD-10-CM | POA: Diagnosis not present

## 2022-02-02 ENCOUNTER — Ambulatory Visit: Payer: Medicare Other | Admitting: Internal Medicine

## 2022-02-02 ENCOUNTER — Encounter: Payer: Self-pay | Admitting: Internal Medicine

## 2022-02-02 DIAGNOSIS — J209 Acute bronchitis, unspecified: Secondary | ICD-10-CM | POA: Diagnosis not present

## 2022-02-02 DIAGNOSIS — E1129 Type 2 diabetes mellitus with other diabetic kidney complication: Secondary | ICD-10-CM | POA: Diagnosis not present

## 2022-02-02 DIAGNOSIS — J9611 Chronic respiratory failure with hypoxia: Secondary | ICD-10-CM | POA: Diagnosis not present

## 2022-02-02 DIAGNOSIS — Z9989 Dependence on other enabling machines and devices: Secondary | ICD-10-CM

## 2022-02-02 DIAGNOSIS — G4733 Obstructive sleep apnea (adult) (pediatric): Secondary | ICD-10-CM | POA: Diagnosis not present

## 2022-02-02 DIAGNOSIS — R058 Other specified cough: Secondary | ICD-10-CM | POA: Diagnosis not present

## 2022-02-02 DIAGNOSIS — J849 Interstitial pulmonary disease, unspecified: Secondary | ICD-10-CM | POA: Diagnosis not present

## 2022-02-02 DIAGNOSIS — R809 Proteinuria, unspecified: Secondary | ICD-10-CM | POA: Diagnosis not present

## 2022-02-02 MED ORDER — BENZONATATE 200 MG PO CAPS: 200.0000 mg | ORAL_CAPSULE | Freq: Three times a day (TID) | ORAL | 1 refills | Status: DC | PRN

## 2022-02-02 MED ORDER — VALSARTAN 40 MG PO TABS: 40.0000 mg | ORAL_TABLET | Freq: Every day | ORAL | 11 refills | Status: DC

## 2022-02-02 NOTE — Progress Notes (Signed)
Subjective:   Patient ID: Lindsay Brady, female    DOB: 09/21/1950    MRN: [id number]    Brief patient profile:  70  yowf quit smoking 1998 at wt around 200 with some am cough and did fine s rx but in 2011 noted doe across parking lot where worked and dx as copd by [employer] and self referred to pulmonary clinic 12/05/2015 as shifting all her care to [place] with new dx of ILD 02/10/2016 p macrodantin exposure and no evidence of airflow ostruction on symb 160 2bid with poor baseline hfa technique.     History of Present Illness  12/05/2015 1st [facility] office visit/ Kendra Woolford   Chief Complaint  Patient presents with   Pulmonary Consult    Self referral. Pt states dxed with Emphysema in 2011. She has been seen at [facility] in the past. She c/o hoarseness and increased cough for the past yr.  Cough is prod with white to tan sputum.  She also c/o SOB with walking long distances and up stairs. Hot weather tends to make her breathing worse. She uses ventolin 2 x per day on average.   indolent onset doe x 6 y better on tudorza and spiriva (mouth dry) and no change on symbicort  Needs saba at least twice daily  Already did rehab with wt = ? But did well  Doe = MMRC3 = can't walk 100 yards even at a slow pace at a flat grade s stopping due to sob   Cough p stopped smoking got worse then better and resolved until one year prior to OV  And present daily since but never while on cpap / neg w/u reflux by GI but ent disagreed  rec Plan A = Automatic = symbicort 160 Take 2 puffs first thing in am and then another 2 puffs about 12 hours later / tudorza one twice daily  Plan B = Backup Only use your albuterol as a rescue medication GERD diet  For drainage / throat tickle try take CHLORPHENIRAMINE  4 mg - take one every 4 hours as needed        08/05/2021  f/u ov/[name] re: 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/ no flare off prednisone   Chief Complaint  Patient presents with   Follow-up    Sleeps elevated but  still feels like "choking" during the night, PND   Dyspnea:  [facility] parking / pushes cart around [employer] ok not checking sats  Cough: none  Sleeping: electric bed 45 degrees ok on cpap does not know who rx SABA use: using only p ex once a week  02: not using it  Covid status:   vax x 4  Rec We will be referring you to one of our sleep doctors here Make sure you check your oxygen saturation  AT  your highest level of activity (not after you stop)   to be sure it stays over 90%  We will walk again to today to see if you qualify for 0xygen > desat p 250 ft so rec titrate to keep > 90%   Echo 11/03/21 Grade 1  diastolic dysfunction  but poor viz AV with peak gradient 17.4    02/02/2022  f/u ov/[name] re: 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   Dyspnea:  grocery shopping s [phone] / uses [facility] parking / has stationery bike plans to start using but poor insight into how/ when to use 02  Cough: dry daytime cough on acei better only tessilon  Sleeping: 45 degrees, cough fine p falls asleep/ on cpap but needs requalify (years ago [place] started and does not want to return there)  SABA use: rarely  02: 2lpm activity     No obvious day to day or daytime variability or assoc excess/ purulent sputum or mucus plugs or hemoptysis or cp or chest tightness, subjective wheeze or overt sinus or hb symptoms.   Sleeping as above  without nocturnal  or early am exacerbation  of respiratory  c/o's or need for noct saba. Also denies any obvious fluctuation of symptoms with weather or environmental changes or other aggravating or alleviating factors except as outlined above   No unusual exposure hx or h/o childhood pna/ asthma or knowledge of premature birth.  Current Allergies, Complete Past Medical History, Past Surgical  History, Family History, and Social History were reviewed in [employer] record.  ROS  The following are not active complaints unless bolded Hoarseness, sore throat, dysphagia, dental problems, itching, sneezing,  nasal congestion or discharge of excess mucus or purulent secretions, ear ache,   fever, chills, sweats, unintended wt loss or wt gain, classically pleuritic or exertional cp,  orthopnea pnd or arm/hand swelling  or leg swelling, presyncope, palpitations, abdominal pain, anorexia, nausea, vomiting, diarrhea  or change in bowel habits or change in bladder habits, change in stools or change in urine, dysuria, hematuria,  rash, arthralgias, visual complaints, headache, numbness, weakness or ataxia or problems with walking or coordination,  change in mood or  memory.        Current Meds  Medication Sig   acetaminophen (TYLENOL) 500 MG tablet Take 500 mg by mouth every 6 (six) hours as needed.   albuterol (VENTOLIN HFA) 108 (90 Base) MCG/ACT inhaler Inhale 2 puffs into the lungs every 6 (six) hours as needed for wheezing or shortness of breath.   benzonatate (TESSALON) 200 MG capsule Take 1 capsule (200 mg total) by mouth 3 (three) times daily as needed for cough.   chlorpheniramine (CHLOR-TRIMETON) 4 MG tablet Take 4 mg by mouth at bedtime.   Cholecalciferol (DIALYVITE VITAMIN D 5000 PO) Take 5,000 Units by mouth daily.   escitalopram (LEXAPRO) 10 MG tablet TAKE 1 TABLET BY MOUTH EVERY DAY   famotidine (PEPCID) 20 MG tablet Take one After breakfast and supper   fluticasone (FLONASE) 50 MCG/ACT nasal spray Place 2 sprays into both nostrils daily.   ibuprofen (ADVIL,MOTRIN) 800 MG tablet Take 1 tablet (800 mg total) by mouth every 8 (eight) hours as needed.   lisinopril (ZESTRIL) 5 MG tablet Take 1 tablet (5 mg total) by mouth daily.   loratadine (ALLERGY) 10 MG tablet Take 10 mg by mouth daily.   Melatonin 10 MG TABS Take 10 mg by mouth at bedtime.   MYRBETRIQ 50 MG  TB24 tablet TAKE 1 TABLET BY MOUTH EVERY DAY   oxyCODONE-acetaminophen (PERCOCET/ROXICET) 5-325 MG tablet Take 1 tablet by mouth 4 (four) times daily.   OXYGEN 2lpm with sleep and 3lpm with exertion [facility]   simvastatin (ZOCOR) 20 MG tablet TAKE 1 TABLET BY MOUTH EVERYDAY AT BEDTIME                    Objective:  Physical Exam  Wts  02/02/2022        235  08/05/2021        243  01/20/2021       231   04/11/2018     283  01/09/2018      275  07/12/2017      262  01/10/2017       259   10/08/2016       286  09/08/2016       294   07/27/2016      298  05/04/2016        290 03/23/2016     278   02/10/16 276 lb (125.2 kg)  01/07/16 274 lb (124.3 kg)  12/05/15 279 lb 3.2 oz (126.6 kg)    Vital signs reviewed  02/02/2022  - Note at rest 02 sats  97% on RA   General appearance:    amb morbidly obese wf nad     HEENT : Oropharynx  clear     Nasal turbinates nl    NECK :  without  apparent JVD/ palpable Nodes/TM    LUNGS: no acc muscle use,  Nl contour chest which is clear to A and P bilaterally without cough on insp or exp maneuvers   CV:  RRR  no s3   2-3/6 SEM s increase in P2, and no edema   ABD:  obese soft and nontender with nl inspiratory excursion in the supine position. No bruits or organomegaly appreciated   MS:  Nl gait/ ext warm without deformities Or obvious joint restrictions  calf tenderness, cyanosis or clubbing    SKIN: warm and dry without lesions    NEURO:  alert, approp, nl sensorium with  no motor or cerebellar deficits apparent.      I personally reviewed images and agree with radiology impression as follows:  CXR:   pa and alteral 11/25/21  Chronic interstitial lung disease with pulmonary fibrosis. There are no new focal infiltrates. There is no pleural effusion.         Assessment & Plan:

## 2022-02-02 NOTE — Assessment & Plan Note (Addendum)
Body mass index is 40.44 kg/m.  -  trending down/ encouraged Lab Results  Component Value Date   TSH 1.76 08/14/2020     Contributing to doe and risk of GERD >>>   reviewed the need and the process to achieve and maintain neg calorie balance > defer f/u primary care including intermittently monitoring thyroid status

## 2022-02-02 NOTE — Patient Instructions (Addendum)
My office will be contacting you by phone for referral to CPAP titration trial   - if you don't hear back from my office within one week please call us back or notify us thru MyChart and we'll address it right away.   Make sure you check your oxygen saturation  AT  your highest level of activity (not after you stop)   to be sure it stays over 90% and adjust  02 flow upward to maintain this level if needed but remember to turn it back to previous settings when you stop (to conserve your supply).   Keep working on weight loss   Stop lisinopril and start valsartan 40 mg daily   For cough ok to take tessalon (benzoate) 100mg  three times daily as needed but the cough should be gone in a few weeks  Contineu famotidine 20 mg after bfast and supper   Please schedule a follow up office visit in 6 weeks, call sooner if needed

## 2022-02-02 NOTE — Assessment & Plan Note (Addendum)
Trial of gerd diet/ 1st gen H1 12/05/2015 >>> - trial off tudorza 02/10/2016 >>> - flare with pseudowheeze at ov 09/08/2016 > rec max gerd rx - Spirometry 09/08/2016  No obst s rx prior  - 01/10/2017 flared off gerd rx but very mild - Allergy profile  07/12/17  >  Eos 0.1 /  IgE  68 RAST neg - 02/02/2022 worse on ACEi > d/c'd 02/02/2022   Upper airway cough syndrome (previously labeled PNDS),  is so named because it's frequently impossible to sort out how much is  CR/sinusitis with freq throat clearing (which can be related to primary GERD)   vs  causing  secondary (" extra esophageal")  GERD from wide swings in gastric pressure that occur with throat clearing, often  promoting self use of mint and menthol lozenges that reduce the lower esophageal sphincter tone and exacerbate the problem further in a cyclical fashion.   These are the same pts (now being labeled as having "irritable larynx syndrome" by some cough centers) who not infrequently have a history of having failed to tolerate ace inhibitors,  dry powder inhalers or biphosphonates or report having atypical/extraesophageal reflux symptoms that don't respond to standard doses of PPI  and are easily confused as having aecopd or asthma flares by even experienced allergists/ pulmonologists (myself included).           Each maintenance medication was reviewed in detail including emphasizing most importantly the difference between maintenance and prns and under what circumstances the prns are to be triggered using an action plan format where appropriate.  Total time for H and P, chart review, counseling, reviewing 02 device(s) and generating customized AVS unique to this office visit / same day charting = > 30 min for multiple  refractory respiratory  symptoms of uncertain etiology

## 2022-02-02 NOTE — Assessment & Plan Note (Signed)
cpap per Dr Chewing in [place] -  referred to sleep medicine 08/05/2021 (not done as of 02/02/2022 )  -  CPAP titration 02/02/2022 >>>   Discussed in detail all the  indications, usual  risks and alternatives  relative to the benefits with patient who agrees to proceed with w/u as outlined.

## 2022-02-02 NOTE — Assessment & Plan Note (Signed)
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 -  12/12/14  FVC = 2.11 - 06/19/15   FVC = 2.16  -  PFTs 05/18/16  FVC 2.07 (63%) no obst and dlco 52 corrects to 98 on 10 mg daily pred with esr 26 -  ESR 07/27/2016  =   31  @ pred 5 mg daily > wean off pred by mid March 2018 and no change in ESR 09/08/2016  - .HSP profile 09/08/2016 >  Neg -  Spirometry 09/08/2016  FVC  1.89 off pred  - 07/12/2017   Walked RA  2 laps @ 185 ft each stopped due to  Sob /desat to 80%  - ESR 07/12/2017 = 34 (no change ) - 08/05/2021   Walked on RA   x  one  lap(s) =  approx 250  ft  @ nl pace, stopped due to sob with sats 87%  Then placed on 2lpm and completed [id number] with lowest 02 sats 97%  - 02/02/2022 still dsats to 88% on RA p 750 ft > see resp failure   02 sats with ex have remained level off prednisone so only rx is approp 02 > see sep a/p

## 2022-02-02 NOTE — Assessment & Plan Note (Signed)
Changed ACEi to ARB 02/02/2022 due to cough   ACE inhibitors are problematic in  pts with airway complaints because  even experienced pulmonologists can't always distinguish ace effects from copd/asthma.  By themselves they don't actually cause a problem, much like oxygen can't by itself start a fire, but they certainly serve as a powerful catalyst or enhancer for any "fire"  or inflammatory process in the upper airway, be it caused by an ET  tube or more commonly reflux (especially in the obese or pts with known GERD or who are on biphoshonates).    In the era of ARB near equivalency until we have a better handle on the reversibility of the airway problem, it just makes sense to avoid ACEI  entirely in the short run and then decide later, having established a level of airway control using a reasonable limited regimen, whether to add back ace but even then being very careful to observe the pt for worsening airway control and number of meds used/ needed to control symptoms.    Rec:  valsartran 40 mg daily and f/u PCP

## 2022-02-02 NOTE — Assessment & Plan Note (Signed)
SATURATION QUALIFICATIONS:  02/10/2016  Patient Saturations on Room Air at Rest = 90% Patient Saturations on Room Air while Ambulating = 83% Patient Saturations on 3 Liters of oxygen while Ambulating = 90% - 07/27/2016   Walked RA  2 laps @ 185 ft each stopped due to  desat to 88% RA - 10/08/2016   Walked RA  2 laps @ 185 ft each stopped due to sob/ sats 88% fast pace   - 07/12/2017   Walked RA  2 laps @ 185 ft each stopped due to  Sob /desat to 80%  - 01/20/2021 walked RX slow pace x 250 ft with sats 94% and no sob - 08/05/2021   Walked on RA*  x  one  lap(s) =  approx 250  ft  @ nl pace, stopped due to sob with sats 87%  Then placed on 2lpm and completed 539ft  with lowest 02 sats 97%  - 02/02/2022   Walked on RA  x  3  lap(s) =  approx 750  ft  @ mod fast pace, stopped due to sob with lowest 02 sats 88% then on 2lpm POC maint 98% thru another 250 ft  Advised: Make sure you check your oxygen saturation  AT  your highest level of activity (not after you stop)   to be sure it stays over 90% and adjust  02 flow upward to maintain this level if needed but remember to turn it back to previous settings when you stop (to conserve your supply).

## 2022-02-10 DIAGNOSIS — Z79899 Other long term (current) drug therapy: Secondary | ICD-10-CM | POA: Diagnosis not present

## 2022-02-10 DIAGNOSIS — R0602 Shortness of breath: Secondary | ICD-10-CM | POA: Diagnosis not present

## 2022-02-10 DIAGNOSIS — M25512 Pain in left shoulder: Secondary | ICD-10-CM | POA: Diagnosis not present

## 2022-02-10 DIAGNOSIS — M545 Low back pain, unspecified: Secondary | ICD-10-CM | POA: Diagnosis not present

## 2022-02-10 DIAGNOSIS — I10 Essential (primary) hypertension: Secondary | ICD-10-CM | POA: Diagnosis not present

## 2022-02-10 DIAGNOSIS — E7800 Pure hypercholesterolemia, unspecified: Secondary | ICD-10-CM | POA: Diagnosis not present

## 2022-02-10 DIAGNOSIS — Z1231 Encounter for screening mammogram for malignant neoplasm of breast: Secondary | ICD-10-CM | POA: Diagnosis not present

## 2022-02-10 DIAGNOSIS — G4700 Insomnia, unspecified: Secondary | ICD-10-CM | POA: Diagnosis not present

## 2022-02-10 DIAGNOSIS — E559 Vitamin D deficiency, unspecified: Secondary | ICD-10-CM | POA: Diagnosis not present

## 2022-02-10 DIAGNOSIS — G8929 Other chronic pain: Secondary | ICD-10-CM | POA: Diagnosis not present

## 2022-02-10 DIAGNOSIS — E119 Type 2 diabetes mellitus without complications: Secondary | ICD-10-CM | POA: Diagnosis not present

## 2022-02-11 DIAGNOSIS — H52203 Unspecified astigmatism, bilateral: Secondary | ICD-10-CM | POA: Diagnosis not present

## 2022-02-11 DIAGNOSIS — H2513 Age-related nuclear cataract, bilateral: Secondary | ICD-10-CM | POA: Diagnosis not present

## 2022-02-11 DIAGNOSIS — E119 Type 2 diabetes mellitus without complications: Secondary | ICD-10-CM | POA: Diagnosis not present

## 2022-02-11 DIAGNOSIS — J449 Chronic obstructive pulmonary disease, unspecified: Secondary | ICD-10-CM | POA: Diagnosis not present

## 2022-02-11 DIAGNOSIS — R0602 Shortness of breath: Secondary | ICD-10-CM | POA: Diagnosis not present

## 2022-02-11 LAB — HM DIABETES EYE EXAM

## 2022-02-12 ENCOUNTER — Encounter: Payer: Self-pay | Admitting: Nurse Practitioner

## 2022-02-12 ENCOUNTER — Ambulatory Visit: Payer: Medicare Other | Admitting: Nurse Practitioner

## 2022-02-12 VITALS — BP 110/70 | HR 94 | Ht 64.0 in | Wt 236.0 lb

## 2022-02-12 DIAGNOSIS — Z1211 Encounter for screening for malignant neoplasm of colon: Secondary | ICD-10-CM | POA: Diagnosis not present

## 2022-02-12 DIAGNOSIS — Z01818 Encounter for other preprocedural examination: Secondary | ICD-10-CM

## 2022-02-12 NOTE — Patient Instructions (Addendum)
If you are age 71 or older, your body mass index should be between 23-30. Your Body mass index is 40.51 kg/m. If this is out of the aforementioned range listed, please consider follow up with your Primary Care Provider.  If you are age 35 or younger, your body mass index should be between 19-25. Your Body mass index is 40.51 kg/m. If this is out of the aformentioned range listed, please consider follow up with your Primary Care Provider.   __________________________________________________________  The [facility] providers would like to encourage you to use [facility] to communicate with providers for non-urgent requests or questions.  Due to long hold times on the telephone, sending your provider a message by [facility] may be a faster and more efficient way to get a response.  Please allow 48 business hours for a response.  Please remember that this is for non-urgent requests.   Due to recent changes in healthcare laws, you may see the results of your imaging and laboratory studies on MyChart before your provider has had a chance to review them.  We understand that in some cases there may be results that are confusing or concerning to you. Not all laboratory results come back in the same time frame and the provider may be waiting for multiple results in order to interpret others.  Please give [place] 48 hours in order for your provider to thoroughly review all the results before contacting the office for clarification of your results.   We will contact you as soon as we have hospital date available to schedule your procedure.   Thank you for choosing me and  Gastroenterology.  Willette Cluster, NP

## 2022-02-12 NOTE — Progress Notes (Signed)
Chief Complaint: Here for colon cancer screening. Has chronic loose stool    Assessment & Plan   # 71 year old female for colon cancer screening.  No blood in stool.  She has chronic loose stool without any recent changes.  She is not anemic.  Her father did have colon cancer but she is not sure about age of diagnosis (somewhere between 70 and 58 years of age).  The risks and benefits of colonoscopy with possible polypectomy / biopsies were discussed and the patient agrees to proceed.  Procedure will need to be done at the hospital given her intermittent oxygen requirement.  Right now there is a waiting list for screening colonoscopies needing to be done at the hospital.  I will place her on the wait list  # Chronic loose stool.  Started years ago after cholecystectomy.  Probably bile acid related. She  was very sensitive to cholestyramine unable to tolerate even half a packet without developing constipation.  Continue Imodium as needed  # Interstitial lung disease with intermittent O2 requirements  # Chronic GERD manifested as hoarseness and heartburn.  Asymptomatic on daily famotidine   HPI:    Lindsay Brady is a 71 y.o. year old female with a past medical history of diastolic dysfunction, obesity, OSA, interstitial lung disease on intermittent 02, GERD, DM2, hyperlipidemia, arthritis, anxiety, chronic diarrhea. See PMH / [place] for additional history  Patient is referred by PCP for colon cancer screening. Her father had colon cancer, diagnosed sometimes between age 13-34 years of age. Her last colonoscopy was in 2011 and reportedly normal. She was seen by GI with [facility] in 2018 to discuss follow up colonoscopy and also diarrhea. She tells me that she did not go through with the colonoscopy. She had woken up during prior colonoscopy and was nervous about returning.  She has not had any blood in her stool  Lindsay Brady gives a history of chronic diarrhea most often occurring postprandially.   The diarrhea is urgent and sometimes associated with incontinence. Symptoms started after her cholecystectomy many years ago.  She tried Questran but it caused constipation.  She even reduced to 1/2 pack / day but could not tolerate it.  She recalls also trying fiber which did not help . She takes Imodium as needed, especially if she is going to be out in the social situation.   Lindsay Brady gives a history of chronic GERD. Sounds like she had an upper endoscopy several years ago. Her symptoms include heartburn and hoarseness.  She is asymptomatic on famotidine which was apparently started by her pulmonologist.  She sleeps elevated at 45 degrees.  Previous Labs / Imaging::    Latest Ref Rng & Units 08/24/2021    2:19 PM 08/14/2020   11:02 AM 06/28/2018   11:00 AM  CBC  WBC 4.0 - 10.5 K/uL 6.9  5.4  5.4   Hemoglobin 12.0 - 15.0 g/dL 13.4  13.4  13.4   Hematocrit 36.0 - 46.0 % 39.5  39.7  40.1   Platelets 150.0 - 400.0 K/uL 219.0  213.0  221.0     Lab Results  Component Value Date   LIPASE 31 07/18/2014      Latest Ref Rng & Units 08/24/2021    2:19 PM 08/14/2020   11:02 AM 06/28/2018   11:00 AM  CMP  Glucose 70 - 99 mg/dL 101  106  118   BUN 6 - 23 mg/dL 20  17  13    Creatinine 0.40 - 1.20  mg/dL 7.16  9.67  8.93   Sodium 135 - 145 mEq/L 137  138  136   Potassium 3.5 - 5.1 mEq/L 4.0  4.2  4.6   Chloride 96 - 112 mEq/L 102  102  100   CO2 19 - 32 mEq/L 28  29  30    Calcium 8.4 - 10.5 mg/dL 9.8  9.3  9.3   Total Protein 6.0 - 8.3 g/dL 7.4  6.6  6.5   Total Bilirubin 0.2 - 1.2 mg/dL 0.5  0.6  0.6   Alkaline Phos 39 - 117 U/L 62  75  68   AST 0 - 37 U/L 17  14  20    ALT 0 - 35 U/L 13  9  18     Imaging:   Echocardiogram June 2023  1. Left ventricular ejection fraction, by estimation, is 60 to 65%. The  left ventricle has normal function. The left ventricle has no regional  wall motion abnormalities. Left ventricular diastolic parameters are  consistent with Grade I diastolic  dysfunction  (impaired relaxation).   2. Right ventricular systolic function is normal. The right ventricular  size is normal. There is normal pulmonary artery systolic pressure. The  estimated right ventricular systolic pressure is 31.8 mmHg.   3. The mitral valve is normal in structure. No evidence of mitral valve  regurgitation. No evidence of mitral stenosis.   4. The aortic valve was not well visualized. Aortic valve regurgitation  is not visualized.   5. The inferior vena cava is dilated in size with >50% respiratory  variability, suggesting right atrial pressure of 8 mmHg.   Past Medical History:  Diagnosis Date   Anxiety    Arthritis    Cholecystitis 07/2014   COPD (chronic obstructive pulmonary disease) (HCC)    Diabetes mellitus without complication (HCC)    type 2    Fracture 2013 ?   left leg   GERD (gastroesophageal reflux disease)    hx of gerd   Headache    Hx: UTI (urinary tract infection)    OA (osteoarthritis) of knee    Pneumonia    hx of viral pna years ago   Shortness of breath dyspnea    Sleep apnea    uses cpap   Urgency of urination    Past Surgical History:  Procedure Laterality Date   ABDOMINAL HYSTERECTOMY     CARPAL TUNNEL RELEASE     CHOLECYSTECTOMY N/A 07/19/2014   Procedure: LAPAROSCOPIC CHOLECYSTECTOMY WITH INTRAOPERATIVE CHOLANGIOGRAM;  Surgeon: 07/2014, MD;  Location: [facility] OR;  Service: General;  Laterality: N/A;   COLONOSCOPY  ?   2011   KNEE ARTHROSCOPY     TONSILLECTOMY     Family History  Problem Relation Age of Onset   Stroke Mother    Lung cancer Father        smoked   Colon cancer Father    Cancer Paternal Grandmother    Cancer Paternal Grandfather    Social History   Tobacco Use   Smoking status: Former    Packs/day: 2.00    Years: 30.00    Total pack years: 60.00    Types: Cigarettes    Quit date: 05/31/1996    Years since quitting: 25.7   Smokeless tobacco: Never  Vaping Use   Vaping Use: Never used  Substance Use Topics    Alcohol use: No    Alcohol/week: 0.0 standard drinks of alcohol    Comment: rare   Drug use: No  Current Outpatient Medications  Medication Sig Dispense Refill   acetaminophen (TYLENOL) 500 MG tablet Take 500 mg by mouth every 6 (six) hours as needed.     albuterol (VENTOLIN HFA) 108 (90 Base) MCG/ACT inhaler Inhale 2 puffs into the lungs every 6 (six) hours as needed for wheezing or shortness of breath. 1 each 11   benzonatate (TESSALON) 200 MG capsule Take 1 capsule (200 mg total) by mouth 3 (three) times daily as needed for cough. 30 capsule 1   chlorpheniramine (CHLOR-TRIMETON) 4 MG tablet Take 4 mg by mouth at bedtime.     Cholecalciferol (DIALYVITE VITAMIN D 5000 PO) Take 5,000 Units by mouth daily.     escitalopram (LEXAPRO) 10 MG tablet TAKE 1 TABLET BY MOUTH EVERY DAY 90 tablet 1   famotidine (PEPCID) 20 MG tablet Take one After breakfast and supper     fluticasone (FLONASE) 50 MCG/ACT nasal spray Place 2 sprays into both nostrils daily. 18.2 mL 2   ibuprofen (ADVIL,MOTRIN) 800 MG tablet Take 1 tablet (800 mg total) by mouth every 8 (eight) hours as needed. 90 tablet 0   loratadine (ALLERGY) 10 MG tablet Take 10 mg by mouth daily.     Melatonin 10 MG TABS Take 10 mg by mouth at bedtime.     MYRBETRIQ 50 MG TB24 tablet TAKE 1 TABLET BY MOUTH EVERY DAY 90 tablet 1   oxyCODONE-acetaminophen (PERCOCET/ROXICET) 5-325 MG tablet Take 1 tablet by mouth 4 (four) times daily.     OXYGEN 2lpm with sleep and 3lpm with exertion [facility]     simvastatin (ZOCOR) 20 MG tablet TAKE 1 TABLET BY MOUTH EVERYDAY AT BEDTIME 90 tablet 1   valsartan (DIOVAN) 40 MG tablet Take 1 tablet (40 mg total) by mouth daily. 30 tablet 11   No current facility-administered medications for this visit.   Allergies  Allergen Reactions   Bactrim [Sulfamethoxazole-Trimethoprim] Rash   Erythromycin Other (See Comments)    Pain in stomach    Fosamax  [Alendronate Sodium] Cough    After taking 1 pill, pt states that  she had a cough.      Review of Systems: Positive for anxiety, arthritis, cough, heart murmur, shortness of breath, sleeping problems, urine leakage.  All other systems reviewed and negative except where noted in HPI.   Wt Readings from Last 3 Encounters:  02/02/22 235 lb 9.6 oz (106.9 kg)  11/25/21 244 lb (110.7 kg)  08/24/21 242 lb 4.8 oz (109.9 kg)    Physical Exam   BP 110/70   Pulse 94   Ht 5\' 4"  (1.626 m)   Wt 236 lb (107 kg)   BMI 40.51 kg/m  Constitutional:  Generally well appearing female in no acute distress. Psychiatric: Pleasant. Normal mood and affect. Behavior is normal. EENT: Pupils normal.  Conjunctivae are normal. No scleral icterus. Neck supple.  Cardiovascular: Normal rate, regular rhythm. No edema Pulmonary/chest: Effort normal and breath sounds normal. No wheezing, rales or rhonchi. Abdominal: Soft, nondistended, nontender. Bowel sounds active throughout. There are no masses palpable. No hepatomegaly. Neurological: Alert and oriented to person place and time. Skin: Skin is warm and dry. No rashes noted.  , NP  02/12/2022, 1:30 PM  Cc:  Referring Provider 02/14/2022, [place]*

## 2022-02-15 ENCOUNTER — Ambulatory Visit (INDEPENDENT_AMBULATORY_CARE_PROVIDER_SITE_OTHER): Payer: Medicare Other | Admitting: Podiatry

## 2022-02-15 ENCOUNTER — Encounter: Payer: Self-pay | Admitting: Podiatry

## 2022-02-15 DIAGNOSIS — M79675 Pain in left toe(s): Secondary | ICD-10-CM | POA: Diagnosis not present

## 2022-02-15 DIAGNOSIS — M79674 Pain in right toe(s): Secondary | ICD-10-CM | POA: Diagnosis not present

## 2022-02-15 DIAGNOSIS — E1142 Type 2 diabetes mellitus with diabetic polyneuropathy: Secondary | ICD-10-CM

## 2022-02-15 DIAGNOSIS — B351 Tinea unguium: Secondary | ICD-10-CM

## 2022-02-15 NOTE — Progress Notes (Signed)
Agree with the assessment and plan as outlined by Tye Savoy, NP.  She could also consider Colestipol as an alternative to cholestyramine for post-cholecystectomy diarrhea  Bracken Moffa E. Candis Schatz, MD [facility]

## 2022-02-15 NOTE — Progress Notes (Signed)
  Subjective:  Patient ID: Lindsay Brady, female    DOB: 10/26/1950,   MRN: [id number]  Chief Complaint  Patient presents with   Foot Problem    [place]    71 y.o. female presents for concern of thickened elongated and painful nails that are difficult to trim. Requesting to have them trimmed today. Denies any burning or tingling in her feet.Patient is diabetic and last A1c was 6.3.   . Denies any other pedal complaints. Denies n/v/f/c.   PCP: Lelon Frohlich MD   Past Medical History:  Diagnosis Date   Anxiety    Arthritis    Cholecystitis 07/2014   COPD (chronic obstructive pulmonary disease) ([place])    Diabetes mellitus without complication ([place])    type 2    Fracture 2013 ?   left leg   GERD (gastroesophageal reflux disease)    hx of gerd   Headache    Hx: UTI (urinary tract infection)    OA (osteoarthritis) of knee    Pneumonia    hx of viral pna years ago   Shortness of breath dyspnea    Sleep apnea    uses cpap   Urgency of urination     Objective:  Physical Exam: Vascular: DP/PT pulses 2/4 bilateral. CFT <3 seconds. Absent hair growth on digits. Edema noted to bilateral lower extremities. Xerosis noted bilaterally.  Skin. No lacerations or abrasions bilateral feet. Nails 1-5 bilateral  are thickened discolored and elongated with subungual debris.  Musculoskeletal: MMT 5/5 bilateral lower extremities in DF, PF, Inversion and Eversion. Deceased ROM in DF of ankle joint.  Neurological: Sensation intact to light touch. Protective sensation diminished bilateral.     Assessment:   1. Pain due to onychomycosis of toenails of both feet   2. Type 2 diabetes mellitus with diabetic polyneuropathy, without long-term current use of insulin ([place])       Plan:  Patient was evaluated and treated and all questions answered. -Discussed and educated patient on diabetic foot care, especially with  regards to the vascular, neurological and musculoskeletal systems.   -Stressed the importance of good glycemic control and the detriment of not  controlling glucose levels in relation to the foot. -Discussed supportive shoes at all times and checking feet regularly.  -Discussed clubbing of second toes. No concern today will keep an eye on them.  -Mechanically debrided all nails 1-5 bilateral using sterile nail nipper and filed with dremel without incident  -Answered all patient questions -Patient to return  in 3 months for at risk foot care -Patient advised to call the office if any problems or questions arise in the meantime.   Lorenda Peck, DPM

## 2022-02-16 ENCOUNTER — Ambulatory Visit
Admission: RE | Admit: 2022-02-16 | Discharge: 2022-02-16 | Disposition: A | Discharge status: Home / Self Care | Payer: Medicare Other | Source: Ambulatory Visit | Attending: Internal Medicine | Admitting: Internal Medicine

## 2022-02-16 DIAGNOSIS — Z1231 Encounter for screening mammogram for malignant neoplasm of breast: Secondary | ICD-10-CM | POA: Diagnosis not present

## 2022-02-16 DIAGNOSIS — Z13820 Encounter for screening for osteoporosis: Secondary | ICD-10-CM

## 2022-02-16 DIAGNOSIS — Z780 Asymptomatic menopausal state: Secondary | ICD-10-CM | POA: Diagnosis not present

## 2022-02-16 DIAGNOSIS — M8588 Other specified disorders of bone density and structure, other site: Secondary | ICD-10-CM | POA: Diagnosis not present

## 2022-02-18 ENCOUNTER — Other Ambulatory Visit: Payer: Self-pay | Admitting: Internal Medicine

## 2022-02-18 DIAGNOSIS — R928 Other abnormal and inconclusive findings on diagnostic imaging of breast: Secondary | ICD-10-CM

## 2022-02-20 ENCOUNTER — Other Ambulatory Visit: Payer: Self-pay | Admitting: Nurse Practitioner

## 2022-02-20 DIAGNOSIS — R058 Other specified cough: Secondary | ICD-10-CM

## 2022-02-24 ENCOUNTER — Ambulatory Visit (HOSPITAL_BASED_OUTPATIENT_CLINIC_OR_DEPARTMENT_OTHER): Payer: Medicare Other | Admitting: Pulmonary Disease

## 2022-03-01 ENCOUNTER — Other Ambulatory Visit: Payer: Self-pay | Admitting: Internal Medicine

## 2022-03-01 ENCOUNTER — Ambulatory Visit
Admission: RE | Admit: 2022-03-01 | Discharge: 2022-03-01 | Disposition: A | Discharge status: Home / Self Care | Payer: Medicare Other | Source: Ambulatory Visit | Attending: Internal Medicine | Admitting: Internal Medicine

## 2022-03-01 DIAGNOSIS — R921 Mammographic calcification found on diagnostic imaging of breast: Secondary | ICD-10-CM

## 2022-03-01 DIAGNOSIS — R928 Other abnormal and inconclusive findings on diagnostic imaging of breast: Secondary | ICD-10-CM

## 2022-03-04 ENCOUNTER — Encounter: Payer: Self-pay | Admitting: Internal Medicine

## 2022-03-07 ENCOUNTER — Ambulatory Visit (HOSPITAL_BASED_OUTPATIENT_CLINIC_OR_DEPARTMENT_OTHER): Payer: Medicare Other | Attending: Internal Medicine | Admitting: Pulmonary Disease

## 2022-03-07 DIAGNOSIS — G4733 Obstructive sleep apnea (adult) (pediatric): Secondary | ICD-10-CM | POA: Diagnosis not present

## 2022-03-09 ENCOUNTER — Ambulatory Visit
Admission: RE | Admit: 2022-03-09 | Discharge: 2022-03-09 | Disposition: A | Discharge status: Home / Self Care | Payer: Medicare Other | Source: Ambulatory Visit | Attending: Internal Medicine | Admitting: Internal Medicine

## 2022-03-09 DIAGNOSIS — N6012 Diffuse cystic mastopathy of left breast: Secondary | ICD-10-CM | POA: Diagnosis not present

## 2022-03-09 DIAGNOSIS — R921 Mammographic calcification found on diagnostic imaging of breast: Secondary | ICD-10-CM

## 2022-03-09 DIAGNOSIS — G4733 Obstructive sleep apnea (adult) (pediatric): Secondary | ICD-10-CM

## 2022-03-09 HISTORY — PX: BREAST BIOPSY: SHX20

## 2022-03-09 NOTE — Procedures (Signed)
     Patient Name: Lindsay Brady, Lindsay Brady Date: 03/07/2022 Gender: Female D.O.B: 23-Sep-1950 Age (years): 60 Referring Provider: Tanda Rockers Height (inches): 11 Interpreting Physician: Chesley Mires MD, ABSM Weight (lbs): 230 RPSGT: Zadie Rhine BMI: 18 MRN: [id number] Neck Size: 15.00  CLINICAL INFORMATION The patient is referred for a CPAP titration to treat sleep apnea.  SLEEP STUDY TECHNIQUE As per the AASM Manual for the Scoring of Sleep and Associated Events v2.3 (April 2016) with a hypopnea requiring 4% desaturations.  The channels recorded and monitored were frontal, central and occipital EEG, electrooculogram (EOG), submentalis EMG (chin), nasal and oral airflow, thoracic and abdominal wall motion, anterior tibialis EMG, snore microphone, electrocardiogram, and pulse oximetry. Continuous positive airway pressure (CPAP) was initiated at the beginning of the study and titrated to treat sleep-disordered breathing.  MEDICATIONS Medications self-administered by patient taken the night of the study : IMODIUM, LEXAPRO, PEPCID, Myrbetriq, Angus, DIOVAN  TECHNICIAN COMMENTS Comments added by technician: Pt went to restroom twice. Patient had difficulty initiating sleep. Comments added by scorer: N/A  RESPIRATORY PARAMETERS Optimal PAP Pressure (cm): 7 AHI at Optimal Pressure (/hr): 0 Overall Minimal O2 (%): 89.0 Supine % at Optimal Pressure (%): 64 Minimal O2 at Optimal Pressure (%): 89.0   SLEEP ARCHITECTURE The study was initiated at 9:33:49 PM and ended at 4:26:43 AM.  Sleep onset time was 69.7 minutes and the sleep efficiency was 62.7%%. The total sleep time was 259 minutes.  The patient spent 3.7%% of the night in stage N1 sleep, 61.2%% in stage N2 sleep, 0.0%% in stage N3 and 35.1% in REM.Stage REM latency was 114.0 minutes  Wake after sleep onset was 84.2. Alpha intrusion was absent. Supine sleep was 63.51%.  CARDIAC DATA The 2 lead EKG demonstrated sinus rhythm.  The mean heart rate was 64.3 beats per minute. Other EKG findings include: None.  LEG MOVEMENT DATA The total Periodic Limb Movements of Sleep (PLMS) were 0. The PLMS index was 0.0. A PLMS index of <15 is considered normal in adults.  IMPRESSIONS - She did well with CPAP 7 cm H2O. - Supplemental oxygen was not used during this study.  DIAGNOSIS - Obstructive Sleep Apnea (G47.33)  RECOMMENDATIONS - Trial of CPAP therapy on 7 cm H2O with a Medium size Resmed Nasal Airfit N20 mask and heated humidification. - Avoid alcohol, sedatives and other CNS depressants that may worsen sleep apnea and disrupt normal sleep architecture. - Sleep hygiene should be reviewed to assess factors that may improve sleep quality. - Weight management and regular exercise should be initiated or continued.  [Electronically signed] 03/09/2022 10:42 AM  Chesley Mires MD, ABSM Diplomate, American Board of Sleep Medicine NPI: [id number]  [place] PH: [phone]   FX: [phone] [place]

## 2022-03-10 DIAGNOSIS — Z79899 Other long term (current) drug therapy: Secondary | ICD-10-CM | POA: Diagnosis not present

## 2022-03-10 DIAGNOSIS — R0602 Shortness of breath: Secondary | ICD-10-CM | POA: Diagnosis not present

## 2022-03-10 DIAGNOSIS — I10 Essential (primary) hypertension: Secondary | ICD-10-CM | POA: Diagnosis not present

## 2022-03-10 DIAGNOSIS — E7800 Pure hypercholesterolemia, unspecified: Secondary | ICD-10-CM | POA: Diagnosis not present

## 2022-03-10 DIAGNOSIS — M545 Low back pain, unspecified: Secondary | ICD-10-CM | POA: Diagnosis not present

## 2022-03-10 DIAGNOSIS — G4700 Insomnia, unspecified: Secondary | ICD-10-CM | POA: Diagnosis not present

## 2022-03-10 DIAGNOSIS — E119 Type 2 diabetes mellitus without complications: Secondary | ICD-10-CM | POA: Diagnosis not present

## 2022-03-10 DIAGNOSIS — M25512 Pain in left shoulder: Secondary | ICD-10-CM | POA: Diagnosis not present

## 2022-03-10 DIAGNOSIS — G8929 Other chronic pain: Secondary | ICD-10-CM | POA: Diagnosis not present

## 2022-03-10 DIAGNOSIS — E559 Vitamin D deficiency, unspecified: Secondary | ICD-10-CM | POA: Diagnosis not present

## 2022-03-10 DIAGNOSIS — Z1231 Encounter for screening mammogram for malignant neoplasm of breast: Secondary | ICD-10-CM | POA: Diagnosis not present

## 2022-03-10 DIAGNOSIS — Z1211 Encounter for screening for malignant neoplasm of colon: Secondary | ICD-10-CM | POA: Diagnosis not present

## 2022-03-12 DIAGNOSIS — Z79899 Other long term (current) drug therapy: Secondary | ICD-10-CM | POA: Diagnosis not present

## 2022-03-13 DIAGNOSIS — J449 Chronic obstructive pulmonary disease, unspecified: Secondary | ICD-10-CM | POA: Diagnosis not present

## 2022-03-13 DIAGNOSIS — R0602 Shortness of breath: Secondary | ICD-10-CM | POA: Diagnosis not present

## 2022-03-16 ENCOUNTER — Encounter: Payer: Self-pay | Admitting: Internal Medicine

## 2022-03-16 ENCOUNTER — Ambulatory Visit: Payer: Medicare Other | Admitting: Internal Medicine

## 2022-03-16 VITALS — BP 112/68 | HR 82 | Temp 98.4°F | Ht 64.0 in | Wt 233.0 lb

## 2022-03-16 DIAGNOSIS — J849 Interstitial pulmonary disease, unspecified: Secondary | ICD-10-CM | POA: Diagnosis not present

## 2022-03-16 DIAGNOSIS — Z23 Encounter for immunization: Secondary | ICD-10-CM | POA: Diagnosis not present

## 2022-03-16 DIAGNOSIS — G4733 Obstructive sleep apnea (adult) (pediatric): Secondary | ICD-10-CM | POA: Diagnosis not present

## 2022-03-16 NOTE — Assessment & Plan Note (Signed)
cpap per Dr Chewing in [place]> referred to sleep medicine 08/05/2021  -  CPAP titration 03/07/22 recs CPAP therapy on 7 cm H2O with a Medium size Resmed Nasal Airfit N20 mask and heated humidification.> f/u Dr Halford Chessman   Discussed in detail all the  indications, usual  risks and alternatives  relative to the benefits with patient who agrees to proceed with Rx as outlined.      Etiology and pathophysiology of osa including relationship to obesity reviewed in detail

## 2022-03-16 NOTE — Assessment & Plan Note (Signed)
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 -  12/12/14  FVC = 2.11 - 06/19/15   FVC = 2.16  -  PFTs 05/18/16  FVC 2.07 (63%) no obst and dlco 52 corrects to 98 on 10 mg daily pred with esr 26 -  ESR 07/27/2016  =   31  @ pred 5 mg daily > wean off pred by mid March 2018 and no change in ESR 09/08/2016  - .HSP profile 09/08/2016 >  Neg -  Spirometry 09/08/2016  FVC  1.89 off pred  - 07/12/2017   Walked RA  2 laps @ 185 ft each stopped due to  Sob /desat to 80%  - ESR 07/12/2017 = 34 (no change ) - 08/05/2021   Walked on RA   x  one  lap(s) =  approx 250  ft  @ nl pace, stopped due to sob with sats 87%  Then placed on 2lpm and completed 5086f with lowest 02 sats 97%  - 02/02/2022 still dsats to 88% on RA p 750 ft > see resp failure  - 03/16/2022   Walked on RA  x  3  lap(s) =  approx 750  ft  @ fast pace, stopped due to end of study with lowest 02 sats 89%  s sob but limited by knee pain    No evidence progression of PF c/w prior macrodantin exp > main reversible problems now are conditioning and wt loss > see avs for instructions unique to this ov  Each maintenance medication was reviewed in detail including emphasizing most importantly the difference between maintenance and prns and under what circumstances the prns are to be triggered using an action plan format where appropriate.  Total time for H and P, chart review, counseling,  directly observing portions of ambulatory 02 saturation study/ and generating customized AVS unique to this office visit / same day charting = 30 min summary final f/u ov

## 2022-03-16 NOTE — Patient Instructions (Addendum)
My office will be contacting you by phone for referral to for   new cpap machine and mask per Dr Juanetta Gosling instructions  - if you don't hear back from my office within one week please call us back or notify us thru MyChart and we'll address it right away   We will set you up follow up with Dr Halford Chessman for sleep medicine   Make sure you check your oxygen saturation  AT  your highest level of activity (not after you stop)   to be sure it stays over 90% and adjust  02 flow upward to maintain this level if needed  - bike riding at home is your best bet as gentler on your knees - be sure to start with zero resistance  Pulmonary medicine follow up is as needed

## 2022-03-16 NOTE — Progress Notes (Signed)
Subjective:   Patient ID: Lindsay Brady, female    DOB: 09/21/1950    MRN: [id number]    Brief patient profile:  70  yowf quit smoking 1998 at wt around 200 with some am cough and did fine s rx but in 2011 noted doe across parking lot where worked and dx as copd by [employer] and self referred to pulmonary clinic 12/05/2015 as shifting all her care to [place] with new dx of ILD 02/10/2016 p macrodantin exposure and no evidence of airflow ostruction on symb 160 2bid with poor baseline hfa technique.     History of Present Illness  12/05/2015 1st [facility] office visit/ Lindsay Brady   Chief Complaint  Patient presents with   Pulmonary Consult    Self referral. Pt states dxed with Emphysema in 2011. She has been seen at [facility] in the past. She c/o hoarseness and increased cough for the past yr.  Cough is prod with white to tan sputum.  She also c/o SOB with walking long distances and up stairs. Hot weather tends to make her breathing worse. She uses ventolin 2 x per day on average.   indolent onset doe x 6 y better on tudorza and spiriva (mouth dry) and no change on symbicort  Needs saba at least twice daily  Already did rehab with wt = ? But did well  Doe = MMRC3 = can't walk 100 yards even at a slow pace at a flat grade s stopping due to sob   Cough p stopped smoking got worse then better and resolved until one year prior to OV  And present daily since but never while on cpap / neg w/u reflux by GI but ent disagreed  rec Plan A = Automatic = symbicort 160 Take 2 puffs first thing in am and then another 2 puffs about 12 hours later / tudorza one twice daily  Plan B = Backup Only use your albuterol as a rescue medication GERD diet  For drainage / throat tickle try take CHLORPHENIRAMINE  4 mg - take one every 4 hours as needed        08/05/2021  f/u ov/Lindsay Brady re: 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/ no flare off prednisone   Chief Complaint  Patient presents with   Follow-up    Sleeps elevated but  still feels like "choking" during the night, PND   Dyspnea:  [facility] parking / pushes cart around [employer] store ok not checking sats  Cough: none  Sleeping: electric bed 45 degrees ok on cpap does not know who rx SABA use: using only p ex once a week  02: not using it  Covid status:   vax x 4  Rec We will be referring you to one of our sleep doctors here Make sure you check your oxygen saturation  AT  your highest level of activity (not after you stop)   to be sure it stays over 90%  We will walk again to today to see if you qualify for 0xygen > desat p 250 ft so rec titrate to keep > 90%   Echo 11/03/21 Grade 1  diastolic dysfunction  but poor viz AV with peak gradient 17.4    02/02/2022  f/u ov/Lindsay Brady re: 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   Dyspnea:  grocery shopping s [phone] / uses [facility] parking / has stationery bike plans to start using but poor insight into how/ when to use 02  Cough: dry daytime cough on acei better only tessilon  Sleeping: 45 degrees, cough fine p falls asleep/ on cpap but needs requalify (years ago WS started and does not want to return there)  SABA use: rarely  02: 2lpm activity  Rec My office will be contacting you by phone for referral to CPAP titration trial   Make sure you check your oxygen saturation  AT  your highest level of activity (not after you stop)   to be sure it stays over 90%  Keep working on weight loss  Stop lisinopril and start valsartan 40 mg daily  For cough ok to take tessalon (benzoate) 100mg  three times daily as needed but the cough should be gone in a few weeks Continue  famotidine 20 mg after bfast and supper   03/07/22 cpap titration: Lindsay Brady  - Trial of CPAP therapy on 7 cm H2O with a Medium size Resmed Nasal Airfit N20 mask and heated  humidification.   03/16/2022  f/u ov/Lindsay Brady re: PF   maint on prn saba   Chief Complaint  Patient presents with   Follow-up    Follow-up for sleep study    Dyspnea:  very sedentary, no bike riding  Cough: better  Sleeping: 45 degrees  on "broken cpap"  SABA use: every 2 weeks - never pre treats or rechallenges 02: has prn, not using  Covid status:   4 x vax      No obvious day to day or daytime variability or assoc excess/ purulent sputum or mucus plugs or hemoptysis or cp or chest tightness, subjective wheeze or overt sinus or hb symptoms.   Sleeping  without nocturnal  or early am exacerbation  of respiratory  c/o's or need for noct saba. Also denies any obvious fluctuation of symptoms with weather or environmental changes or other aggravating or alleviating factors except as outlined above   No unusual exposure hx or h/o childhood pna/ asthma or knowledge of premature birth.  Current Allergies, Complete Past Medical History, Past Surgical History, Family History, and Social History were reviewed in [employer] record.  ROS  The following are not active complaints unless bolded Hoarseness, sore throat, dysphagia, dental problems, itching, sneezing,  nasal congestion or discharge of excess mucus or purulent secretions, ear ache,   fever, chills, sweats, unintended wt loss or wt gain, classically pleuritic or exertional cp,  orthopnea pnd or arm/hand swelling  or leg swelling, presyncope, palpitations, abdominal pain, anorexia, nausea, vomiting, diarrhea  or change in bowel habits or change in bladder habits, change in stools or change in urine, dysuria, hematuria,  rash, arthralgias, visual complaints, headache, numbness, weakness or ataxia or problems with walking or coordination,  change in mood or  memory.        Current Meds  Medication Sig   acetaminophen (TYLENOL) 500 MG tablet Take 500 mg by mouth every 6 (six) hours as needed.   albuterol (VENTOLIN HFA)  108 (90 Base) MCG/ACT inhaler Inhale 2 puffs into the lungs every 6 (six) hours as needed for wheezing or shortness of breath.   benzonatate (TESSALON) 200 MG capsule Take 1 capsule (200 mg total) by mouth 3 (three) times daily as needed for cough.   chlorpheniramine (CHLOR-TRIMETON) 4 MG tablet Take 4 mg by mouth at  bedtime.   Cholecalciferol (DIALYVITE VITAMIN D 5000 PO) Take 5,000 Units by mouth daily.   escitalopram (LEXAPRO) 10 MG tablet TAKE 1 TABLET BY MOUTH EVERY DAY   famotidine (PEPCID) 20 MG tablet Take one After breakfast and supper   fluticasone (FLONASE) 50 MCG/ACT nasal spray SPRAY 2 SPRAYS INTO EACH NOSTRIL EVERY DAY   ibuprofen (ADVIL,MOTRIN) 800 MG tablet Take 1 tablet (800 mg total) by mouth every 8 (eight) hours as needed.   loratadine (ALLERGY) 10 MG tablet Take 10 mg by mouth daily.   Melatonin 10 MG TABS Take 10 mg by mouth at bedtime.   MYRBETRIQ 50 MG TB24 tablet TAKE 1 TABLET BY MOUTH EVERY DAY   oxyCODONE-acetaminophen (PERCOCET/ROXICET) 5-325 MG tablet Take 1 tablet by mouth 4 (four) times daily.   OXYGEN 2lpm with sleep and 3lpm with exertion [facility]   simvastatin (ZOCOR) 20 MG tablet TAKE 1 TABLET BY MOUTH EVERYDAY AT BEDTIME   valsartan (DIOVAN) 40 MG tablet Take 1 tablet (40 mg total) by mouth daily.                  Objective:  Physical Exam  Wts  03/16/2022     233  02/02/2022        235  08/05/2021        243  01/20/2021       231   04/11/2018     283  01/09/2018      275  07/12/2017      262  01/10/2017       259   10/08/2016       286  09/08/2016       294   07/27/2016      298  05/04/2016        290 03/23/2016     278   02/10/16 276 lb (125.2 kg)  01/07/16 274 lb (124.3 kg)  12/05/15 279 lb 3.2 oz (126.6 kg)   Vital signs reviewed  03/16/2022  - Note at rest 02 sats  96% on RA   General appearance:    obese amb wf nad     HEENT : Oropharynx  clear       NECK :  without  apparent JVD/ palpable Nodes/TM    LUNGS: no acc muscle use,  Nl  contour chest which is clear to A and P bilaterally without cough on insp or exp maneuvers   CV:  RRR  no s3 or murmur 1-2/6 sem  s icrease  in P2, and no edema   ABD:  obese soft and nontender with nl inspiratory excursion in the supine position. No bruits or organomegaly appreciated   MS:  Nl gait/ ext warm without deformities Or obvious joint restrictions  calf tenderness, cyanosis or clubbing    SKIN: warm and dry without lesions    NEURO:  alert, approp, nl sensorium with  no motor or cerebellar deficits apparent.       Assessment & Plan:

## 2022-03-22 ENCOUNTER — Telehealth: Payer: Self-pay | Admitting: Nurse Practitioner

## 2022-03-22 NOTE — Telephone Encounter (Signed)
Inbound call from patient requesting a call back to discuss the wait list for the hospital. Please advise.

## 2022-03-23 NOTE — Telephone Encounter (Signed)
Patient is returning your call.  

## 2022-03-23 NOTE — Telephone Encounter (Signed)
Left message for pt to call back  °

## 2022-03-24 NOTE — Telephone Encounter (Signed)
Spoke with pt and let pt know she is on the hospital list and we will contact her when we have availability. Pt verbalized understanding.

## 2022-03-26 ENCOUNTER — Other Ambulatory Visit: Payer: Self-pay | Admitting: Internal Medicine

## 2022-03-26 DIAGNOSIS — E1169 Type 2 diabetes mellitus with other specified complication: Secondary | ICD-10-CM

## 2022-04-08 DIAGNOSIS — G4700 Insomnia, unspecified: Secondary | ICD-10-CM | POA: Diagnosis not present

## 2022-04-08 DIAGNOSIS — E559 Vitamin D deficiency, unspecified: Secondary | ICD-10-CM | POA: Diagnosis not present

## 2022-04-08 DIAGNOSIS — I10 Essential (primary) hypertension: Secondary | ICD-10-CM | POA: Diagnosis not present

## 2022-04-08 DIAGNOSIS — M545 Low back pain, unspecified: Secondary | ICD-10-CM | POA: Diagnosis not present

## 2022-04-08 DIAGNOSIS — E7800 Pure hypercholesterolemia, unspecified: Secondary | ICD-10-CM | POA: Diagnosis not present

## 2022-04-08 DIAGNOSIS — M25512 Pain in left shoulder: Secondary | ICD-10-CM | POA: Diagnosis not present

## 2022-04-08 DIAGNOSIS — Z1211 Encounter for screening for malignant neoplasm of colon: Secondary | ICD-10-CM | POA: Diagnosis not present

## 2022-04-08 DIAGNOSIS — E119 Type 2 diabetes mellitus without complications: Secondary | ICD-10-CM | POA: Diagnosis not present

## 2022-04-08 DIAGNOSIS — G8929 Other chronic pain: Secondary | ICD-10-CM | POA: Diagnosis not present

## 2022-04-09 ENCOUNTER — Other Ambulatory Visit: Payer: Self-pay | Admitting: Internal Medicine

## 2022-04-09 DIAGNOSIS — N959 Unspecified menopausal and perimenopausal disorder: Secondary | ICD-10-CM

## 2022-04-13 DIAGNOSIS — R0602 Shortness of breath: Secondary | ICD-10-CM | POA: Diagnosis not present

## 2022-04-13 DIAGNOSIS — J449 Chronic obstructive pulmonary disease, unspecified: Secondary | ICD-10-CM | POA: Diagnosis not present

## 2022-05-06 DIAGNOSIS — E7800 Pure hypercholesterolemia, unspecified: Secondary | ICD-10-CM | POA: Diagnosis not present

## 2022-05-06 DIAGNOSIS — M25512 Pain in left shoulder: Secondary | ICD-10-CM | POA: Diagnosis not present

## 2022-05-06 DIAGNOSIS — I10 Essential (primary) hypertension: Secondary | ICD-10-CM | POA: Diagnosis not present

## 2022-05-06 DIAGNOSIS — M545 Low back pain, unspecified: Secondary | ICD-10-CM | POA: Diagnosis not present

## 2022-05-06 DIAGNOSIS — G4700 Insomnia, unspecified: Secondary | ICD-10-CM | POA: Diagnosis not present

## 2022-05-06 DIAGNOSIS — E119 Type 2 diabetes mellitus without complications: Secondary | ICD-10-CM | POA: Diagnosis not present

## 2022-05-06 DIAGNOSIS — G8929 Other chronic pain: Secondary | ICD-10-CM | POA: Diagnosis not present

## 2022-05-06 DIAGNOSIS — Z1211 Encounter for screening for malignant neoplasm of colon: Secondary | ICD-10-CM | POA: Diagnosis not present

## 2022-05-06 DIAGNOSIS — E559 Vitamin D deficiency, unspecified: Secondary | ICD-10-CM | POA: Diagnosis not present

## 2022-05-13 DIAGNOSIS — J449 Chronic obstructive pulmonary disease, unspecified: Secondary | ICD-10-CM | POA: Diagnosis not present

## 2022-05-13 DIAGNOSIS — R0602 Shortness of breath: Secondary | ICD-10-CM | POA: Diagnosis not present

## 2022-05-14 ENCOUNTER — Telehealth: Payer: Self-pay | Admitting: Internal Medicine

## 2022-05-14 MED ORDER — PREDNISONE 10 MG PO TABS: ORAL_TABLET | ORAL | 0 refills | Status: DC

## 2022-05-14 MED ORDER — AZITHROMYCIN 250 MG PO TABS: ORAL_TABLET | ORAL | 0 refills | Status: DC

## 2022-05-14 NOTE — Telephone Encounter (Signed)
Called and spoke with patient. She verbalized understanding. RXs have been sent in.   Nothing further needed at time of call.

## 2022-05-14 NOTE — Telephone Encounter (Signed)
Called and spoke with pt who states she has had complaints of cough, wheezing, and a headache for the past 3 days. Asked pt if she is coughing up any phlegm and she said she is not able to get the phlegm up. States the cough is a wet cough. Pt said that her O2 sats are maintaining at 90% or above even with the coughing fits. Denies any complaints of fever. Has not had to use her rescue inhaler yet.   Pt is wanting to know what we could recommend to help with her symptoms. Dr. Sherene Sires, please advise.

## 2022-05-14 NOTE — Telephone Encounter (Signed)
Zpak - says erythromycin causes stomach ache but this is well tolerated and I suspect she's take it before  Prednisone 10 mg take  4 each am x 2 days,   2 each am x 2 days,  1 each am x 2 days and stop   Oxycodone works well for cough as does tessalon, the  tessalon we can call in a refill if needed

## 2022-05-18 ENCOUNTER — Telehealth: Payer: Self-pay

## 2022-05-18 ENCOUNTER — Other Ambulatory Visit: Payer: Self-pay | Admitting: Internal Medicine

## 2022-05-18 DIAGNOSIS — F419 Anxiety disorder, unspecified: Secondary | ICD-10-CM

## 2022-05-18 NOTE — Telephone Encounter (Signed)
Left VM for patient to return call regarding hospital opening 05/27/22.

## 2022-05-20 DIAGNOSIS — G4733 Obstructive sleep apnea (adult) (pediatric): Secondary | ICD-10-CM | POA: Diagnosis not present

## 2022-06-03 DIAGNOSIS — M25512 Pain in left shoulder: Secondary | ICD-10-CM | POA: Diagnosis not present

## 2022-06-03 DIAGNOSIS — M545 Low back pain, unspecified: Secondary | ICD-10-CM | POA: Diagnosis not present

## 2022-06-03 DIAGNOSIS — Z1211 Encounter for screening for malignant neoplasm of colon: Secondary | ICD-10-CM | POA: Diagnosis not present

## 2022-06-03 DIAGNOSIS — Z79899 Other long term (current) drug therapy: Secondary | ICD-10-CM | POA: Diagnosis not present

## 2022-06-03 DIAGNOSIS — G8929 Other chronic pain: Secondary | ICD-10-CM | POA: Diagnosis not present

## 2022-06-03 DIAGNOSIS — E7800 Pure hypercholesterolemia, unspecified: Secondary | ICD-10-CM | POA: Diagnosis not present

## 2022-06-03 DIAGNOSIS — I10 Essential (primary) hypertension: Secondary | ICD-10-CM | POA: Diagnosis not present

## 2022-06-03 DIAGNOSIS — E559 Vitamin D deficiency, unspecified: Secondary | ICD-10-CM | POA: Diagnosis not present

## 2022-06-03 DIAGNOSIS — E119 Type 2 diabetes mellitus without complications: Secondary | ICD-10-CM | POA: Diagnosis not present

## 2022-06-07 DIAGNOSIS — Z79899 Other long term (current) drug therapy: Secondary | ICD-10-CM | POA: Diagnosis not present

## 2022-06-11 ENCOUNTER — Ambulatory Visit: Payer: Medicare Other | Admitting: Internal Medicine

## 2022-06-13 DIAGNOSIS — J449 Chronic obstructive pulmonary disease, unspecified: Secondary | ICD-10-CM | POA: Diagnosis not present

## 2022-06-13 DIAGNOSIS — R0602 Shortness of breath: Secondary | ICD-10-CM | POA: Diagnosis not present

## 2022-06-20 DIAGNOSIS — G4733 Obstructive sleep apnea (adult) (pediatric): Secondary | ICD-10-CM | POA: Diagnosis not present

## 2022-06-22 ENCOUNTER — Encounter: Payer: Self-pay | Admitting: Nurse Practitioner

## 2022-06-22 ENCOUNTER — Ambulatory Visit: Payer: Medicare Other | Admitting: Nurse Practitioner

## 2022-06-22 VITALS — BP 120/68 | HR 68 | Ht 64.5 in | Wt 222.6 lb

## 2022-06-22 DIAGNOSIS — G4733 Obstructive sleep apnea (adult) (pediatric): Secondary | ICD-10-CM | POA: Diagnosis not present

## 2022-06-22 DIAGNOSIS — J849 Interstitial pulmonary disease, unspecified: Secondary | ICD-10-CM | POA: Diagnosis not present

## 2022-06-22 DIAGNOSIS — J9611 Chronic respiratory failure with hypoxia: Secondary | ICD-10-CM

## 2022-06-22 NOTE — Patient Instructions (Signed)
Continue Albuterol inhaler 2 puffs every 6 hours as needed for shortness of breath or wheezing. Notify if symptoms persist despite rescue inhaler/neb use. Continue chlorpheniramine 4 mg at bedtime for cough Continue pepcid 10 mg Twice daily  Continue loratidine 10 mg daily   Continue to use CPAP every night, minimum of 4-6 hours a night.  Change equipment every 30 days or as directed by DME. Wash your tubing with warm soap and water daily, hang to dry. Wash humidifier portion weekly.  Be aware of reduced alertness and do not drive or operate heavy machinery if experiencing this or drowsiness.  Exercise encouraged, as tolerated. Notify if persistent daytime sleepiness occurs even with consistent use of CPAP.  Adjust pressures to 7-10 cmH2O - order sent to DME company   Follow up in 6 months with Dr. Melvyn Novas or Alanson Aly. If symptoms do not improve or worsen, please contact office for sooner follow up or seek emergency care.

## 2022-06-22 NOTE — Progress Notes (Deleted)
@Patient  ID: Lindsay Brady, female    DOB: 10/16/1950, 72 y.o.   MRN: [id number]  Chief Complaint  Patient presents with   Follow-up    Pt f/u for CPAP therapy, she has no concerns and is enjoying the machine    Referring provider: Isaac Bliss, [place]*  HPI:   TEST/EVENTS:   Allergies  Allergen Reactions   Bactrim [Sulfamethoxazole-Trimethoprim] Rash   Erythromycin Other (See Comments)    Pain in stomach    Fosamax  [Alendronate Sodium] Cough    After taking 1 pill, pt states that she had a cough.     Immunization History  Administered Date(s) Administered   Fluad Quad(high Dose 65+) 03/09/2019, 01/14/2021, 03/16/2022   Influenza Whole 02/29/2016   Influenza, High Dose Seasonal PF 03/10/2016, 03/15/2017, 03/15/2017, 03/27/2018, 04/15/2020   Influenza, Seasonal, Injecte, Preservative Fre 03/12/2013, 03/19/2014   Influenza,inj,Quad PF,6+ Mos 03/10/2016   Influenza,trivalent, recombinat, inj, PF 05/04/2012   Moderna Sars-Covid-2 Vaccination 06/26/2019, 07/24/2019, 04/15/2020, 09/23/2020   Pneumococcal Conjugate-13 06/22/2018   Pneumococcal Polysaccharide-23 07/24/2010   Td 01/09/2002   Tdap 12/30/2011   Zoster, Live 08/23/2011    Past Medical History:  Diagnosis Date   Anxiety    Arthritis    Cholecystitis 07/2014   COPD (chronic obstructive pulmonary disease) ([place])    Diabetes mellitus without complication ([place])    type 2    Fracture 2013 ?   left leg   GERD (gastroesophageal reflux disease)    hx of gerd   Headache    Hx: UTI (urinary tract infection)    OA (osteoarthritis) of knee    Pneumonia    hx of viral pna years ago   Shortness of breath dyspnea    Sleep apnea    uses cpap   Urgency of urination     Tobacco History: Social History   Tobacco Use  Smoking Status Former   Packs/day: 2.00   Years: 30.00   Total pack years: 60.00   Types: Cigarettes   Quit date: 05/31/1996   Years since quitting: 26.0  Smokeless Tobacco Never    Counseling given: Not Answered   Outpatient Medications Prior to Visit  Medication Sig Dispense Refill   acetaminophen (TYLENOL) 500 MG tablet Take 500 mg by mouth every 6 (six) hours as needed.     albuterol (VENTOLIN HFA) 108 (90 Base) MCG/ACT inhaler Inhale 2 puffs into the lungs every 6 (six) hours as needed for wheezing or shortness of breath. 1 each 11   benzonatate (TESSALON) 200 MG capsule Take 1 capsule (200 mg total) by mouth 3 (three) times daily as needed for cough. 30 capsule 1   chlorpheniramine (CHLOR-TRIMETON) 4 MG tablet Take 4 mg by mouth at bedtime.     Cholecalciferol (DIALYVITE VITAMIN D 5000 PO) Take 5,000 Units by mouth daily.     escitalopram (LEXAPRO) 10 MG tablet TAKE 1 TABLET BY MOUTH EVERY DAY 90 tablet 0   famotidine (PEPCID) 20 MG tablet Take one After breakfast and supper     fluticasone (FLONASE) 50 MCG/ACT nasal spray SPRAY 2 SPRAYS INTO EACH NOSTRIL EVERY DAY 48 mL 1   ibuprofen (ADVIL,MOTRIN) 800 MG tablet Take 1 tablet (800 mg total) by mouth every 8 (eight) hours as needed. 90 tablet 0   loratadine (ALLERGY) 10 MG tablet Take 10 mg by mouth daily.     Melatonin 10 MG TABS Take 10 mg by mouth at bedtime.     MYRBETRIQ 50 MG TB24 tablet TAKE 1  TABLET BY MOUTH EVERY DAY 90 tablet 1   oxyCODONE-acetaminophen (PERCOCET/ROXICET) 5-325 MG tablet Take 1 tablet by mouth 4 (four) times daily.     OXYGEN 2lpm with sleep and 3lpm with exertion [facility]     simvastatin (ZOCOR) 20 MG tablet TAKE 1 TABLET BY MOUTH EVERYDAY AT BEDTIME 90 tablet 1   valsartan (DIOVAN) 40 MG tablet Take 1 tablet (40 mg total) by mouth daily. 30 tablet 11   azithromycin (ZITHROMAX) 250 MG tablet Take 2 tablets on first day, then 1 tablet once daily until finished. 6 tablet 0   predniSONE (DELTASONE) 10 MG tablet Take 4 tabs x 2 days, 2 tabs x 2 days, then 1 tab x 2 days and stop. 14 tablet 0   No facility-administered medications prior to visit.     Review of Systems:   Constitutional:  No weight loss or gain, night sweats, fevers, chills, fatigue, or lassitude. HEENT: No headaches, difficulty swallowing, tooth/dental problems, or sore throat. No sneezing, itching, ear ache, nasal congestion, or post nasal drip CV:  No chest pain, orthopnea, PND, swelling in lower extremities, anasarca, dizziness, palpitations, syncope Resp: No shortness of breath with exertion or at rest. No excess mucus or change in color of mucus. No productive or non-productive. No hemoptysis. No wheezing.  No chest wall deformity GI:  No heartburn, indigestion, abdominal pain, nausea, vomiting, diarrhea, change in bowel habits, loss of appetite, bloody stools.  GU: No dysuria, change in color of urine, urgency or frequency.  No flank pain, no hematuria  Skin: No rash, lesions, ulcerations MSK:  No joint pain or swelling.  No decreased range of motion.  No back pain. Neuro: No dizziness or lightheadedness.  Psych: No depression or anxiety. Mood stable.     Physical Exam:  BP 120/68   Pulse 68   Ht 5' 4.5" (1.638 m)   Wt 222 lb 9.6 oz (101 kg)   SpO2 98%   BMI 37.62 kg/m   GEN: Pleasant, interactive, well-nourished/chronically-ill appearing/acutely-ill appearing/poorly-nourished/morbidly obese; in no acute distress.****** HEENT:  Normocephalic and atraumatic. EACs patent bilaterally. TM pearly gray with present light reflex bilaterally. PERRLA. Sclera white. Nasal turbinates pink, moist and patent bilaterally. No rhinorrhea present. Oropharynx pink and moist, without exudate or edema. No lesions, ulcerations, or postnasal drip.  NECK:  Supple w/ fair ROM. No JVD present. Normal carotid impulses w/o bruits. Thyroid symmetrical with no goiter or nodules palpated. No lymphadenopathy.   CV: RRR, no m/r/g, no peripheral edema. Pulses intact, +2 bilaterally. No cyanosis, pallor or clubbing. PULMONARY:  Unlabored, regular breathing. Clear bilaterally A&P w/o wheezes/rales/rhonchi. No accessory muscle use.   GI: BS present and normoactive. Soft, non-tender to palpation. No organomegaly or masses detected. No CVA tenderness. MSK: No erythema, warmth or tenderness. Cap refil <2 sec all extrem. No deformities or joint swelling noted.  Neuro: A/Ox3. No focal deficits noted.   Skin: Warm, no lesions or rashe Psych: Normal affect and behavior. Judgement and thought content appropriate.     Lab Results:  CBC    Component Value Date/Time   WBC 6.9 08/24/2021 1419   RBC 4.17 08/24/2021 1419   HGB 13.4 08/24/2021 1419   HCT 39.5 08/24/2021 1419   PLT 219.0 08/24/2021 1419   MCV 94.8 08/24/2021 1419   MCH 31.7 07/21/2014 0430   MCHC 33.9 08/24/2021 1419   RDW 13.9 08/24/2021 1419   LYMPHSABS 2.7 08/24/2021 1419   MONOABS 0.5 08/24/2021 1419   EOSABS 0.1 08/24/2021 1419  BASOSABS 0.0 08/24/2021 1419    BMET    Component Value Date/Time   NA 137 08/24/2021 1419   K 4.0 08/24/2021 1419   CL 102 08/24/2021 1419   CO2 28 08/24/2021 1419   GLUCOSE 101 (H) 08/24/2021 1419   BUN 20 08/24/2021 1419   CREATININE 0.80 08/24/2021 1419   CALCIUM 9.8 08/24/2021 1419   GFRNONAA 68 (L) 07/21/2014 0430   GFRAA 79 (L) 07/21/2014 0430    BNP No results found for: "BNP"   Imaging:  No results found.       Latest Ref Rng & Units 05/18/2016   10:56 AM  PFT Results  FVC-Pre L 2.07   FVC-Predicted Pre % 63   FVC-Post L 2.09   FVC-Predicted Post % 63   Pre FEV1/FVC % % 90   Post FEV1/FCV % % 91   FEV1-Pre L 1.87   FEV1-Predicted Pre % 74   FEV1-Post L 1.90   DLCO uncorrected ml/min/mmHg 13.53   DLCO UNC% % 52   DLVA Predicted % 98   TLC L 2.98   TLC % Predicted % 57   RV % Predicted % 38     No results found for: "NITRICOXIDE"      Assessment & Plan:   No problem-specific Assessment & Plan notes found for this encounter.   I spent *** minutes of dedicated to the care of this patient on the date of this encounter to include pre-visit review of records, face-to-face time  with the patient discussing conditions above, post visit ordering of testing, clinical documentation with the electronic health record, making appropriate referrals as documented, and communicating necessary findings to members of the patients care team.  Clayton Bibles, NP 06/22/2022  Pt aware and understands NP's role.

## 2022-06-22 NOTE — Progress Notes (Signed)
@Patient  ID: Lindsay Brady, female    DOB: 09/09/1950, 72 y.o.   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  She is a patient of Dr. Thurston Hole and last seen in office 08/05/2021.  Past medical history significant for right bundle branch block, OSA on CPAP, DM, obesity, anxiety.  TEST/EVENTS:  05/18/2016 PFT: FVC 63, FEV1 74, ratio 91, TLC 57, DLCOunc 52 11/03/2021 echo:EF 60-65%. GIDD. RV size and function nl. Normal PASP.   03/16/2022: OV with dr. Sherene Sires. Maintained on PRN SABA. Cough better after d/c ACEi. CPAP is broken. Walked on room air with SpO2 low 89%. No evidence of progression of PF c/w prior macrodantin exp > main reversible problems now are conditioning and weight loss. Referred for new CPAP.   06/22/2022: Today - follow up Patient presents today for follow up after receiving new CPAP machine. She is doing well on this. Feels like she sleeps well. No concerns or complaints with it. She denies excessive daytime fatigue or morning headaches. No issues with drowsy driving. She is having some large leaks. She doesn't seem to notice this. Her breathing has been stable. Denies any cough or chest congestion.   05/23/2022-06/21/2022: CPAP auto 7-20 cmH2O 30/30 days; 97% >4 hr; av usage 7 hr 13 min Pressure 95th 10.6 Leaks 95th 52.5 AHI 0.3  Allergies  Allergen Reactions   Bactrim [Sulfamethoxazole-Trimethoprim] Rash   Erythromycin Other (See Comments)    Pain in stomach    Fosamax  [Alendronate Sodium] Cough    After taking 1 pill, pt states that she had a cough.     Immunization History  Administered Date(s) Administered   Fluad Quad(high Dose 65+) 03/09/2019, 01/14/2021,  03/16/2022   Influenza Whole 02/29/2016   Influenza, High Dose Seasonal PF 03/10/2016, 03/15/2017, 03/15/2017, 03/27/2018, 04/15/2020   Influenza, Seasonal, Injecte, Preservative Fre 03/12/2013, 03/19/2014   Influenza,inj,Quad PF,6+ Mos 03/10/2016   Influenza,trivalent, recombinat, inj, PF 05/04/2012   Moderna Sars-Covid-2 Vaccination 06/26/2019, 07/24/2019, 04/15/2020, 09/23/2020   Pneumococcal Conjugate-13 06/22/2018   Pneumococcal Polysaccharide-23 07/24/2010   Td 01/09/2002   Tdap 12/30/2011   Zoster, Live 08/23/2011    Past Medical History:  Diagnosis Date   Anxiety    Arthritis    Cholecystitis 07/2014   COPD (chronic obstructive pulmonary disease) (HCC)    Diabetes mellitus without complication (HCC)    type 2    Fracture 2013 ?   left leg   GERD (gastroesophageal reflux disease)    hx of gerd   Headache    Hx: UTI (urinary tract infection)    OA (osteoarthritis) of knee    Pneumonia    hx of viral pna years ago   Shortness of breath dyspnea    Sleep apnea    uses cpap   Urgency of urination     Tobacco History: Social History   Tobacco Use  Smoking Status Former   Packs/day: 2.00   Years: 30.00   Total pack years: 60.00   Types: Cigarettes   Quit date: 05/31/1996   Years since quitting: 26.0  Smokeless Tobacco Never   Counseling given: Not Answered   Outpatient Medications Prior to Visit  Medication Sig Dispense Refill   acetaminophen (  TYLENOL) 500 MG tablet Take 500 mg by mouth every 6 (six) hours as needed.     albuterol (VENTOLIN HFA) 108 (90 Base) MCG/ACT inhaler Inhale 2 puffs into the lungs every 6 (six) hours as needed for wheezing or shortness of breath. 1 each 11   benzonatate (TESSALON) 200 MG capsule Take 1 capsule (200 mg total) by mouth 3 (three) times daily as needed for cough. 30 capsule 1   chlorpheniramine (CHLOR-TRIMETON) 4 MG tablet Take 4 mg by mouth at bedtime.     Cholecalciferol (DIALYVITE VITAMIN D 5000 PO) Take 5,000 Units by  mouth daily.     escitalopram (LEXAPRO) 10 MG tablet TAKE 1 TABLET BY MOUTH EVERY DAY 90 tablet 0   famotidine (PEPCID) 20 MG tablet Take one After breakfast and supper     fluticasone (FLONASE) 50 MCG/ACT nasal spray SPRAY 2 SPRAYS INTO EACH NOSTRIL EVERY DAY 48 mL 1   ibuprofen (ADVIL,MOTRIN) 800 MG tablet Take 1 tablet (800 mg total) by mouth every 8 (eight) hours as needed. 90 tablet 0   loratadine (ALLERGY) 10 MG tablet Take 10 mg by mouth daily.     Melatonin 10 MG TABS Take 10 mg by mouth at bedtime.     MYRBETRIQ 50 MG TB24 tablet TAKE 1 TABLET BY MOUTH EVERY DAY 90 tablet 1   oxyCODONE-acetaminophen (PERCOCET/ROXICET) 5-325 MG tablet Take 1 tablet by mouth 4 (four) times daily.     OXYGEN 2lpm with sleep and 3lpm with exertion AHC     simvastatin (ZOCOR) 20 MG tablet TAKE 1 TABLET BY MOUTH EVERYDAY AT BEDTIME 90 tablet 1   valsartan (DIOVAN) 40 MG tablet Take 1 tablet (40 mg total) by mouth daily. 30 tablet 11   azithromycin (ZITHROMAX) 250 MG tablet Take 2 tablets on first day, then 1 tablet once daily until finished. 6 tablet 0   predniSONE (DELTASONE) 10 MG tablet Take 4 tabs x 2 days, 2 tabs x 2 days, then 1 tab x 2 days and stop. 14 tablet 0   No facility-administered medications prior to visit.     Review of Systems:   Constitutional: No weight loss or gain, night sweats, fevers, chills, baseline HEENT: No headaches, difficulty swallowing, tooth/dental problems. No sneezing, itching, ear ache, nasal congestion/drainage, sore throat, hoarse voice CV:  No chest pain, orthopnea, PND, swelling in lower extremities, anasarca, dizziness, palpitations, syncope Resp: No shortness of breath with exertion or at rest.  No cough. No hemoptysis. No wheezing.  No chest wall deformity Skin: No rash, lesions, ulcerations MSK:  No joint pain or swelling.  No decreased range of motion.  No back pain. Neuro: No dizziness or lightheadedness.  Psych: No depression or anxiety. Mood stable.      Physical Exam:  BP 120/68   Pulse 68   Ht 5' 4.5" (1.638 m)   Wt 222 lb 9.6 oz (101 kg)   SpO2 98%   BMI 37.62 kg/m   GEN: Pleasant, interactive, well-appearing; obese; in no acute distress HEENT:  Normocephalic and atraumatic. EACs patent bilaterally. TM pearly gray with present light reflex bilaterally. PERRLA. Sclera white. Nasal turbinates pink, moist and patent bilaterally.  No rhinorrhea present. Oropharynx pink and moist, without exudate or edema. No lesions, ulcerations. Mallampati II NECK:  Supple w/ fair ROM. No JVD present. Normal carotid impulses w/o bruits. Thyroid symmetrical with no goiter or nodules palpated. No lymphadenopathy.   CV: RRR, no m/r/g, no peripheral edema. Pulses intact, +2 bilaterally. No cyanosis, pallor  or clubbing. PULMONARY:  Unlabored, regular breathing. Clear bilaterally A&P w/o wheezes/rales/rhonchi. No accessory muscle use. No dullness to percussion. GI: BS present and normoactive. Soft, non-tender to palpation. No organomegaly or masses detected. No CVA tenderness. MSK: No erythema, warmth or tenderness. Cap refil <2 sec all extrem. No deformities or joint swelling noted.  Neuro: A/Ox3. No focal deficits noted.   Skin: Warm, no lesions or rashe Psych: Normal affect and behavior. Judgement and thought content appropriate.     Lab Results:  CBC    Component Value Date/Time   WBC 6.9 08/24/2021 1419   RBC 4.17 08/24/2021 1419   HGB 13.4 08/24/2021 1419   HCT 39.5 08/24/2021 1419   PLT 219.0 08/24/2021 1419   MCV 94.8 08/24/2021 1419   MCH 31.7 07/21/2014 0430   MCHC 33.9 08/24/2021 1419   RDW 13.9 08/24/2021 1419   LYMPHSABS 2.7 08/24/2021 1419   MONOABS 0.5 08/24/2021 1419   EOSABS 0.1 08/24/2021 1419   BASOSABS 0.0 08/24/2021 1419    BMET    Component Value Date/Time   NA 137 08/24/2021 1419   K 4.0 08/24/2021 1419   CL 102 08/24/2021 1419   CO2 28 08/24/2021 1419   GLUCOSE 101 (H) 08/24/2021 1419   BUN 20 08/24/2021  1419   CREATININE 0.80 08/24/2021 1419   CALCIUM 9.8 08/24/2021 1419   GFRNONAA 68 (L) 07/21/2014 0430   GFRAA 79 (L) 07/21/2014 0430    BNP No results found for: "BNP"   Imaging:  No results found.       Latest Ref Rng & Units 05/18/2016   10:56 AM  PFT Results  FVC-Pre L 2.07   FVC-Predicted Pre % 63   FVC-Post L 2.09   FVC-Predicted Post % 63   Pre FEV1/FVC % % 90   Post FEV1/FCV % % 91   FEV1-Pre L 1.87   FEV1-Predicted Pre % 74   FEV1-Post L 1.90   DLCO uncorrected ml/min/mmHg 13.53   DLCO UNC% % 52   DLVA Predicted % 98   TLC L 2.98   TLC % Predicted % 57   RV % Predicted % 38     No results found for: "NITRICOXIDE"      Assessment & Plan:   OSA on CPAP Excellent compliance and receives good benefit from use.  She is having some large leaks on download today.  Possibly related to higher pressures.  Does not seem to be affecting her AHI or sleep quality very much.  We will try decreasing her to settings of 7 to 10 cm of water to see if this helps.  She would like continue with her current mask.  I am okay with this given her low residual AHI and improvement in symptoms.  Patient Instructions  Continue Albuterol inhaler 2 puffs every 6 hours as needed for shortness of breath or wheezing. Notify if symptoms persist despite rescue inhaler/neb use. Continue chlorpheniramine 4 mg at bedtime for cough Continue pepcid 10 mg Twice daily  Continue loratidine 10 mg daily   Continue to use CPAP every night, minimum of 4-6 hours a night.  Change equipment every 30 days or as directed by DME. Wash your tubing with warm soap and water daily, hang to dry. Wash humidifier portion weekly.  Be aware of reduced alertness and do not drive or operate heavy machinery if experiencing this or drowsiness.  Exercise encouraged, as tolerated. Notify if persistent daytime sleepiness occurs even with consistent use of CPAP.  Adjust  pressures to 7-10 cmH2O - order sent to DME  company   Follow up in 6 months with Dr. Sherene Sires or Philis Nettle. If symptoms do not improve or worsen, please contact office for sooner follow up or seek emergency care.    ILD (interstitial lung disease) (HCC)  c/w NSIP p macrodantin exp Stable without progression on recent imaging. Continue with surveillance.   Chronic respiratory failure with hypoxia (HCC) Previous walk without desaturation on room air. Continue supplemental oxygen 2 lpm for goal >88-90%.     I spent 28 minutes of dedicated to the care of this patient on the date of this encounter to include pre-visit review of records, face-to-face time with the patient discussing conditions above, post visit ordering of testing, clinical documentation with the electronic health record, making appropriate referrals as documented, and communicating necessary findings to members of the patients care team.  Noemi Chapel, NP 06/24/2022  Pt aware and understands NP's role.

## 2022-06-23 ENCOUNTER — Other Ambulatory Visit: Payer: Self-pay

## 2022-06-23 DIAGNOSIS — G4733 Obstructive sleep apnea (adult) (pediatric): Secondary | ICD-10-CM

## 2022-06-23 NOTE — Assessment & Plan Note (Signed)
Excellent compliance and receives good benefit from use.  She is having some large leaks on download today.  Possibly related to higher pressures.  Does not seem to be affecting her AHI or sleep quality very much.  We will try decreasing her to settings of 7 to 10 cm of water to see if this helps.  She would like continue with her current mask.  I am okay with this given her low residual AHI and improvement in symptoms.  Patient Instructions  Continue Albuterol inhaler 2 puffs every 6 hours as needed for shortness of breath or wheezing. Notify if symptoms persist despite rescue inhaler/neb use. Continue chlorpheniramine 4 mg at bedtime for cough Continue pepcid 10 mg Twice daily  Continue loratidine 10 mg daily   Continue to use CPAP every night, minimum of 4-6 hours a night.  Change equipment every 30 days or as directed by DME. Wash your tubing with warm soap and water daily, hang to dry. Wash humidifier portion weekly.  Be aware of reduced alertness and do not drive or operate heavy machinery if experiencing this or drowsiness.  Exercise encouraged, as tolerated. Notify if persistent daytime sleepiness occurs even with consistent use of CPAP.  Adjust pressures to 7-10 cmH2O - order sent to DME company   Follow up in 6 months with Dr. Melvyn Novas or Alanson Aly. If symptoms do not improve or worsen, please contact office for sooner follow up or seek emergency care.

## 2022-06-24 ENCOUNTER — Encounter: Payer: Self-pay | Admitting: Nurse Practitioner

## 2022-06-24 NOTE — Assessment & Plan Note (Signed)
Stable without progression on recent imaging. Continue with surveillance.

## 2022-06-24 NOTE — Assessment & Plan Note (Signed)
Previous walk without desaturation on room air. Continue supplemental oxygen 2 lpm for goal >88-90%.

## 2022-07-01 DIAGNOSIS — E119 Type 2 diabetes mellitus without complications: Secondary | ICD-10-CM | POA: Diagnosis not present

## 2022-07-01 DIAGNOSIS — E7800 Pure hypercholesterolemia, unspecified: Secondary | ICD-10-CM | POA: Diagnosis not present

## 2022-07-01 DIAGNOSIS — Z79899 Other long term (current) drug therapy: Secondary | ICD-10-CM | POA: Diagnosis not present

## 2022-07-01 DIAGNOSIS — E559 Vitamin D deficiency, unspecified: Secondary | ICD-10-CM | POA: Diagnosis not present

## 2022-07-01 DIAGNOSIS — Z1211 Encounter for screening for malignant neoplasm of colon: Secondary | ICD-10-CM | POA: Diagnosis not present

## 2022-07-01 DIAGNOSIS — M25512 Pain in left shoulder: Secondary | ICD-10-CM | POA: Diagnosis not present

## 2022-07-01 DIAGNOSIS — I10 Essential (primary) hypertension: Secondary | ICD-10-CM | POA: Diagnosis not present

## 2022-07-01 DIAGNOSIS — M545 Low back pain, unspecified: Secondary | ICD-10-CM | POA: Diagnosis not present

## 2022-07-05 ENCOUNTER — Telehealth: Payer: Medicare Other | Admitting: Internal Medicine

## 2022-07-05 ENCOUNTER — Ambulatory Visit: Payer: Medicare Other | Admitting: Internal Medicine

## 2022-07-05 DIAGNOSIS — Z79899 Other long term (current) drug therapy: Secondary | ICD-10-CM | POA: Diagnosis not present

## 2022-07-06 ENCOUNTER — Encounter: Payer: Self-pay | Admitting: Family Medicine

## 2022-07-06 ENCOUNTER — Telehealth (INDEPENDENT_AMBULATORY_CARE_PROVIDER_SITE_OTHER): Payer: Medicare Other | Admitting: Family Medicine

## 2022-07-06 DIAGNOSIS — R300 Dysuria: Secondary | ICD-10-CM

## 2022-07-06 MED ORDER — CEPHALEXIN 500 MG PO CAPS: 500.0000 mg | ORAL_CAPSULE | Freq: Three times a day (TID) | ORAL | 0 refills | Status: DC

## 2022-07-06 NOTE — Patient Instructions (Addendum)
-I sent the medication(s) we discussed to your pharmacy: Meds ordered this encounter  Medications   cephALEXin (KEFLEX) 500 MG capsule    Sig: Take 1 capsule (500 mg total) by mouth 3 (three) times daily.    Dispense:  15 capsule    Refill:  0     I hope you are feeling better soon!  Seek in person care promptly if your symptoms worsen, new concerns arise or you are not improving with treatment.  It was nice to meet you today. I help [facility] out with telemedicine visits on Tuesdays and Thursdays and am happy to help if you need a virtual follow up visit on those days. Otherwise, if you have any concerns or questions following this visit please schedule a follow up visit with your Primary Care office or seek care at a local urgent care clinic to avoid delays in care. If you are having severe or life threatening symptoms please call 911 and/or go to the nearest emergency room.   Cephalexin Capsules or Tablets What is this medication? CEPHALEXIN (sef a LEX in) treats infections caused by bacteria. It belongs to a group of medications called cephalosporin antibiotics. It will not treat colds, the flu, or infections caused by viruses. This medicine may be used for other purposes; ask your health care provider or pharmacist if you have questions. COMMON BRAND NAME(S): Biocef, Daxbia, Keflex, Keftab What should I tell my care team before I take this medication? They need to know if you have any of these conditions: Bleeding disorder Kidney disease Liver disease Seizures Stomach or intestine problems like colitis An unusual or allergic reaction to cephalexin, other penicillin or cephalosporin antibiotics, other medications, foods, dyes, or preservatives Pregnant or trying to get pregnant Breast-feeding How should I use this medication? Take this medication by mouth. Take it as directed on the prescription label at the same time every day. You can take it with or without food. If it upsets  your stomach, take it with food. Take all of this medication unless your care team tells you to stop it early. Keep taking it even if you think you are better. Talk to your care team about the use of this medication in children. While it may be prescribed for selected conditions, precautions do apply. Overdosage: If you think you have taken too much of this medicine contact a poison control center or emergency room at once. NOTE: This medicine is only for you. Do not share this medicine with others. What if I miss a dose? If you miss a dose, take it as soon as you can. If it is almost time for your next dose, take only that dose. Do not take double or extra doses. What may interact with this medication? Probenecid Some other antibiotics This list may not describe all possible interactions. Give your health care provider a list of all the medicines, herbs, non-prescription drugs, or dietary supplements you use. Also tell them if you smoke, drink alcohol, or use illegal drugs. Some items may interact with your medicine. What should I watch for while using this medication? Tell your care team if your symptoms do not start to get better or if they get worse. Do not treat diarrhea with over the counter products. Contact your care team if you have diarrhea that lasts more than 2 days or if it is severe and watery. This medication may cause serious skin reactions. They can happen weeks to months after starting the medication. Contact your care  team right away if you notice fevers or flu-like symptoms with a rash. The rash may be red or purple and then turn into blisters or peeling of the skin. Or, you might notice a red rash with swelling of the face, lips or lymph nodes in your neck or under your arms. If you have diabetes, you may get a false-positive result for sugar in your urine. Check with your care team. What side effects may I notice from receiving this medication? Side effects that you should  report to your care team as soon as possible: Allergic reactions--skin rash, itching, hives, swelling of the face, lips, tongue, or throat Redness, blistering, peeling, or loosening of the skin, including inside the mouth Severe diarrhea, fever Unusual vaginal discharge, itching, or odor Side effects that usually do not require medical attention (report to your care team if they continue or are bothersome): Diarrhea Headache Nausea This list may not describe all possible side effects. Call your doctor for medical advice about side effects. You may report side effects to FDA at 1-800-FDA-1088. Where should I keep my medication? Keep out of the reach of children and pets. Store at room temperature between 20 and 25 degrees C (68 and 77 degrees F). Throw away any unused medication after the expiration date. NOTE: This sheet is a summary. It may not cover all possible information. If you have questions about this medicine, talk to your doctor, pharmacist, or health care provider.  2023 Elsevier/Gold Standard (2020-05-12 00:00:00)

## 2022-07-06 NOTE — Progress Notes (Signed)
Virtual Visit via Video Note  I connected with Lindsay Brady  on 07/06/22 at  5:40 PM EST by a video enabled telemedicine application and verified that I am speaking with the correct person using two identifiers.  Location patient: [name] Location provider:work or home office Persons participating in the virtual visit: patient, provider  I discussed the limitations and requested verbal permission for telemedicine visit. The patient expressed understanding and agreed to proceed.   HPI:  Acute telemedicine visit for  Dysuria: -Onset: a few days ago -Symptoms include: frequency, urgency -this started after she had diarrhea and had and accident - she feels may have caused a UTI, she has had them in the past, this feels similar -she is requesting empiric treatment as temporarily does not have car so coming to the office would be difficult -has used keflex before and tolerated well -Denies: pain, hematuria, fever, vomiting, nausea, malaise, flank pain, abd/pelvic pain -Has tried: -Pertinent past medical history: see below -Pertinent medication allergies: Allergies  Allergen Reactions   Macrodantin [Nitrofurantoin]     Scars lung   Bactrim [Sulfamethoxazole-Trimethoprim] Rash   Erythromycin Other (See Comments)    Pain in stomach    Fosamax  [Alendronate Sodium] Cough    After taking 1 pill, pt states that she had a cough.     ROS: See pertinent positives and negatives per HPI.  Past Medical History:  Diagnosis Date   Anxiety    Arthritis    Cholecystitis 07/2014   COPD (chronic obstructive pulmonary disease) (HCC)    Diabetes mellitus without complication ([place])    type 2    Fracture 2013 ?   left leg   GERD (gastroesophageal reflux disease)    hx of gerd   Headache    Hx: UTI (urinary tract infection)    OA (osteoarthritis) of knee    Pneumonia    hx of viral pna years ago   Shortness of breath dyspnea    Sleep apnea    uses cpap   Urgency of urination     Past Surgical  History:  Procedure Laterality Date   ABDOMINAL HYSTERECTOMY     CARPAL TUNNEL RELEASE     CHOLECYSTECTOMY N/A 07/19/2014   Procedure: LAPAROSCOPIC CHOLECYSTECTOMY WITH INTRAOPERATIVE CHOLANGIOGRAM;  Surgeon: Georganna Skeans, MD;  Location: [facility];  Service: General;  Laterality: N/A;   COLONOSCOPY  ?   2011   KNEE ARTHROSCOPY     TONSILLECTOMY       Current Outpatient Medications:    acetaminophen (TYLENOL) 500 MG tablet, Take 500 mg by mouth every 6 (six) hours as needed., Disp: , Rfl:    albuterol (VENTOLIN HFA) 108 (90 Base) MCG/ACT inhaler, Inhale 2 puffs into the lungs every 6 (six) hours as needed for wheezing or shortness of breath., Disp: 1 each, Rfl: 11   benzonatate (TESSALON) 200 MG capsule, Take 1 capsule (200 mg total) by mouth 3 (three) times daily as needed for cough., Disp: 30 capsule, Rfl: 1   cephALEXin (KEFLEX) 500 MG capsule, Take 1 capsule (500 mg total) by mouth 3 (three) times daily., Disp: 15 capsule, Rfl: 0   Cholecalciferol (DIALYVITE VITAMIN D 5000 PO), Take 5,000 Units by mouth daily., Disp: , Rfl:    escitalopram (LEXAPRO) 10 MG tablet, TAKE 1 TABLET BY MOUTH EVERY DAY, Disp: 90 tablet, Rfl: 0   famotidine (PEPCID) 20 MG tablet, Take one After breakfast and supper, Disp: , Rfl:    fluticasone (FLONASE) 50 MCG/ACT nasal spray, SPRAY 2 SPRAYS  INTO EACH NOSTRIL EVERY DAY, Disp: 48 mL, Rfl: 1   ibuprofen (ADVIL,MOTRIN) 800 MG tablet, Take 1 tablet (800 mg total) by mouth every 8 (eight) hours as needed., Disp: 90 tablet, Rfl: 0   loratadine (ALLERGY) 10 MG tablet, Take 10 mg by mouth daily., Disp: , Rfl:    MYRBETRIQ 50 MG TB24 tablet, TAKE 1 TABLET BY MOUTH EVERY DAY, Disp: 90 tablet, Rfl: 1   oxyCODONE-acetaminophen (PERCOCET/ROXICET) 5-325 MG tablet, Take 1 tablet by mouth 4 (four) times daily., Disp: , Rfl:    OXYGEN, 2lpm with sleep and 3lpm with exertion AHC, Disp: , Rfl:    simvastatin (ZOCOR) 20 MG tablet, TAKE 1 TABLET BY MOUTH EVERYDAY AT BEDTIME, Disp: 90  tablet, Rfl: 1   valsartan (DIOVAN) 40 MG tablet, Take 1 tablet (40 mg total) by mouth daily., Disp: 30 tablet, Rfl: 11   chlorpheniramine (CHLOR-TRIMETON) 4 MG tablet, Take 4 mg by mouth at bedtime., Disp: , Rfl:    Melatonin 10 MG TABS, Take 10 mg by mouth at bedtime., Disp: , Rfl:   EXAM:  VITALS per patient if applicable:  GENERAL: alert, oriented, appears well and in no acute distress  HEENT: atraumatic, conjunttiva clear, no obvious abnormalities on inspection of external nose and ears  NECK: normal movements of the head and neck  LUNGS: on inspection no signs of respiratory distress, breathing rate appears normal, no obvious gross SOB, gasping or wheezing  CV: no obvious cyanosis  MS: moves all visible extremities without noticeable abnormality  PSYCH/NEURO: pleasant and cooperative, no obvious depression or anxiety, speech and thought processing grossly intact  ASSESSMENT AND PLAN:  Discussed the following assessment and plan:  Dysuria  -we discussed possible serious and likely etiologies, options for evaluation and workup, limitations of telemedicine visit vs in person visit, treatment, treatment risks and precautions. Pt is agreeable to treatment via telemedicine at this moment. She wants to try empiric treatment with keflex. Sent rx. Advised to seek prompt virtual visit or in person care if worsening, new symptoms arise, or if is not improving with treatment as expected per our conversation of expected course. Discussed options for follow up care. Did let this patient know that I do telemedicine on Tuesdays and Thursdays for [facility] and those are the days I am logged into the system. Advised to schedule follow up visit with PCP, [facility] virtual visits or [facility] if any further questions or concerns to avoid delays in care.   I discussed the assessment and treatment plan with the patient. The patient was provided an opportunity to ask questions and all were answered. The  patient agreed with the plan and demonstrated an understanding of the instructions.     Lindsay Kern, DO

## 2022-07-14 DIAGNOSIS — J449 Chronic obstructive pulmonary disease, unspecified: Secondary | ICD-10-CM | POA: Diagnosis not present

## 2022-07-14 DIAGNOSIS — R0602 Shortness of breath: Secondary | ICD-10-CM | POA: Diagnosis not present

## 2022-07-21 DIAGNOSIS — G4733 Obstructive sleep apnea (adult) (pediatric): Secondary | ICD-10-CM | POA: Diagnosis not present

## 2022-07-29 DIAGNOSIS — E119 Type 2 diabetes mellitus without complications: Secondary | ICD-10-CM | POA: Diagnosis not present

## 2022-07-29 DIAGNOSIS — G8929 Other chronic pain: Secondary | ICD-10-CM | POA: Diagnosis not present

## 2022-07-29 DIAGNOSIS — E7800 Pure hypercholesterolemia, unspecified: Secondary | ICD-10-CM | POA: Diagnosis not present

## 2022-07-29 DIAGNOSIS — M25512 Pain in left shoulder: Secondary | ICD-10-CM | POA: Diagnosis not present

## 2022-07-29 DIAGNOSIS — I10 Essential (primary) hypertension: Secondary | ICD-10-CM | POA: Diagnosis not present

## 2022-07-29 DIAGNOSIS — E559 Vitamin D deficiency, unspecified: Secondary | ICD-10-CM | POA: Diagnosis not present

## 2022-07-29 DIAGNOSIS — Z1211 Encounter for screening for malignant neoplasm of colon: Secondary | ICD-10-CM | POA: Diagnosis not present

## 2022-07-29 DIAGNOSIS — M545 Low back pain, unspecified: Secondary | ICD-10-CM | POA: Diagnosis not present

## 2022-07-29 DIAGNOSIS — Z79899 Other long term (current) drug therapy: Secondary | ICD-10-CM | POA: Diagnosis not present

## 2022-08-12 ENCOUNTER — Other Ambulatory Visit: Payer: Self-pay | Admitting: Internal Medicine

## 2022-08-12 DIAGNOSIS — F419 Anxiety disorder, unspecified: Secondary | ICD-10-CM

## 2022-08-12 DIAGNOSIS — J449 Chronic obstructive pulmonary disease, unspecified: Secondary | ICD-10-CM | POA: Diagnosis not present

## 2022-08-12 DIAGNOSIS — R0602 Shortness of breath: Secondary | ICD-10-CM | POA: Diagnosis not present

## 2022-08-18 DIAGNOSIS — G4733 Obstructive sleep apnea (adult) (pediatric): Secondary | ICD-10-CM | POA: Diagnosis not present

## 2022-08-19 DIAGNOSIS — G4733 Obstructive sleep apnea (adult) (pediatric): Secondary | ICD-10-CM | POA: Diagnosis not present

## 2022-08-20 DIAGNOSIS — M5136 Other intervertebral disc degeneration, lumbar region: Secondary | ICD-10-CM | POA: Diagnosis not present

## 2022-08-20 DIAGNOSIS — M25512 Pain in left shoulder: Secondary | ICD-10-CM | POA: Diagnosis not present

## 2022-08-20 DIAGNOSIS — M545 Low back pain, unspecified: Secondary | ICD-10-CM | POA: Diagnosis not present

## 2022-08-28 DIAGNOSIS — I10 Essential (primary) hypertension: Secondary | ICD-10-CM | POA: Diagnosis not present

## 2022-08-28 DIAGNOSIS — E559 Vitamin D deficiency, unspecified: Secondary | ICD-10-CM | POA: Diagnosis not present

## 2022-08-28 DIAGNOSIS — M25512 Pain in left shoulder: Secondary | ICD-10-CM | POA: Diagnosis not present

## 2022-08-28 DIAGNOSIS — E7800 Pure hypercholesterolemia, unspecified: Secondary | ICD-10-CM | POA: Diagnosis not present

## 2022-08-28 DIAGNOSIS — E119 Type 2 diabetes mellitus without complications: Secondary | ICD-10-CM | POA: Diagnosis not present

## 2022-08-28 DIAGNOSIS — Z79899 Other long term (current) drug therapy: Secondary | ICD-10-CM | POA: Diagnosis not present

## 2022-08-28 DIAGNOSIS — M545 Low back pain, unspecified: Secondary | ICD-10-CM | POA: Diagnosis not present

## 2022-09-01 ENCOUNTER — Other Ambulatory Visit: Payer: Self-pay

## 2022-09-01 DIAGNOSIS — Z1211 Encounter for screening for malignant neoplasm of colon: Secondary | ICD-10-CM

## 2022-09-12 DIAGNOSIS — J449 Chronic obstructive pulmonary disease, unspecified: Secondary | ICD-10-CM | POA: Diagnosis not present

## 2022-09-12 DIAGNOSIS — R0602 Shortness of breath: Secondary | ICD-10-CM | POA: Diagnosis not present

## 2022-09-19 DIAGNOSIS — G4733 Obstructive sleep apnea (adult) (pediatric): Secondary | ICD-10-CM | POA: Diagnosis not present

## 2022-09-21 ENCOUNTER — Ambulatory Visit (AMBULATORY_SURGERY_CENTER): Payer: Medicare Other

## 2022-09-21 ENCOUNTER — Encounter: Payer: Self-pay | Admitting: Gastroenterology

## 2022-09-21 VITALS — Ht 64.0 in | Wt 214.0 lb

## 2022-09-21 DIAGNOSIS — Z1211 Encounter for screening for malignant neoplasm of colon: Secondary | ICD-10-CM

## 2022-09-21 MED ORDER — NA SULFATE-K SULFATE-MG SULF 17.5-3.13-1.6 GM/177ML PO SOLN: 1.0000 | Freq: Once | ORAL | 0 refills | Status: AC

## 2022-09-21 NOTE — Progress Notes (Signed)
No egg or soy allergy known to patient  No issues known to pt with past sedation with any surgeries or procedures Patient denies ever being told they had issues or difficulty with intubation  No FH of Malignant Hyperthermia Pt is not on diet pills Pt is not on  home 02  Pt is not on blood thinners  Pt denies issues with constipation  No A fib or A flutter Have any cardiac testing pending--no Pt instructed to use [email or web address] or GoodRx for a price reduction on prep   Can ambulate without assistance  Patient has painful left shoulder.

## 2022-09-25 ENCOUNTER — Other Ambulatory Visit: Payer: Self-pay | Admitting: Internal Medicine

## 2022-09-25 DIAGNOSIS — E1169 Type 2 diabetes mellitus with other specified complication: Secondary | ICD-10-CM

## 2022-09-27 DIAGNOSIS — E7800 Pure hypercholesterolemia, unspecified: Secondary | ICD-10-CM | POA: Diagnosis not present

## 2022-09-27 DIAGNOSIS — M25512 Pain in left shoulder: Secondary | ICD-10-CM | POA: Diagnosis not present

## 2022-09-27 DIAGNOSIS — I10 Essential (primary) hypertension: Secondary | ICD-10-CM | POA: Diagnosis not present

## 2022-09-27 DIAGNOSIS — E559 Vitamin D deficiency, unspecified: Secondary | ICD-10-CM | POA: Diagnosis not present

## 2022-09-27 DIAGNOSIS — E119 Type 2 diabetes mellitus without complications: Secondary | ICD-10-CM | POA: Diagnosis not present

## 2022-09-27 DIAGNOSIS — G8929 Other chronic pain: Secondary | ICD-10-CM | POA: Diagnosis not present

## 2022-09-27 DIAGNOSIS — M545 Low back pain, unspecified: Secondary | ICD-10-CM | POA: Diagnosis not present

## 2022-09-27 DIAGNOSIS — Z79899 Other long term (current) drug therapy: Secondary | ICD-10-CM | POA: Diagnosis not present

## 2022-10-07 ENCOUNTER — Encounter (HOSPITAL_COMMUNITY)
Admission: RE | Disposition: A | Discharge status: Home / Self Care | Payer: Self-pay | Source: Home / Self Care | Attending: Gastroenterology

## 2022-10-07 ENCOUNTER — Ambulatory Visit (HOSPITAL_COMMUNITY)
Admission: RE | Admit: 2022-10-07 | Discharge: 2022-10-07 | Disposition: A | Discharge status: Home / Self Care | Payer: Medicare Other | Attending: Gastroenterology | Admitting: Gastroenterology

## 2022-10-07 ENCOUNTER — Ambulatory Visit (HOSPITAL_BASED_OUTPATIENT_CLINIC_OR_DEPARTMENT_OTHER): Payer: Medicare Other | Admitting: Anesthesiology

## 2022-10-07 ENCOUNTER — Other Ambulatory Visit: Payer: Self-pay

## 2022-10-07 ENCOUNTER — Ambulatory Visit (HOSPITAL_COMMUNITY): Payer: Medicare Other | Admitting: Anesthesiology

## 2022-10-07 ENCOUNTER — Encounter (HOSPITAL_COMMUNITY): Payer: Self-pay | Admitting: Gastroenterology

## 2022-10-07 DIAGNOSIS — K219 Gastro-esophageal reflux disease without esophagitis: Secondary | ICD-10-CM | POA: Insufficient documentation

## 2022-10-07 DIAGNOSIS — E119 Type 2 diabetes mellitus without complications: Secondary | ICD-10-CM | POA: Insufficient documentation

## 2022-10-07 DIAGNOSIS — Z1211 Encounter for screening for malignant neoplasm of colon: Secondary | ICD-10-CM | POA: Diagnosis not present

## 2022-10-07 DIAGNOSIS — K635 Polyp of colon: Secondary | ICD-10-CM | POA: Diagnosis not present

## 2022-10-07 DIAGNOSIS — Z9049 Acquired absence of other specified parts of digestive tract: Secondary | ICD-10-CM | POA: Insufficient documentation

## 2022-10-07 DIAGNOSIS — Z7984 Long term (current) use of oral hypoglycemic drugs: Secondary | ICD-10-CM | POA: Diagnosis not present

## 2022-10-07 DIAGNOSIS — Z800 Family history of malignant neoplasm of digestive organs: Secondary | ICD-10-CM | POA: Insufficient documentation

## 2022-10-07 DIAGNOSIS — J449 Chronic obstructive pulmonary disease, unspecified: Secondary | ICD-10-CM | POA: Diagnosis not present

## 2022-10-07 DIAGNOSIS — Z9981 Dependence on supplemental oxygen: Secondary | ICD-10-CM | POA: Insufficient documentation

## 2022-10-07 DIAGNOSIS — D123 Benign neoplasm of transverse colon: Secondary | ICD-10-CM | POA: Insufficient documentation

## 2022-10-07 DIAGNOSIS — Z87891 Personal history of nicotine dependence: Secondary | ICD-10-CM | POA: Diagnosis not present

## 2022-10-07 DIAGNOSIS — K5730 Diverticulosis of large intestine without perforation or abscess without bleeding: Secondary | ICD-10-CM | POA: Insufficient documentation

## 2022-10-07 HISTORY — PX: HEMOSTASIS CLIP PLACEMENT: SHX6857

## 2022-10-07 HISTORY — PX: BIOPSY: SHX5522

## 2022-10-07 HISTORY — PX: COLONOSCOPY WITH PROPOFOL: SHX5780

## 2022-10-07 HISTORY — PX: SUBMUCOSAL TATTOO INJECTION: SHX6856

## 2022-10-07 HISTORY — PX: POLYPECTOMY: SHX5525

## 2022-10-07 HISTORY — PX: SUBMUCOSAL LIFTING INJECTION: SHX6855

## 2022-10-07 LAB — GLUCOSE, CAPILLARY: Glucose-Capillary: 90 mg/dL (ref 70–99)

## 2022-10-07 SURGERY — COLONOSCOPY WITH PROPOFOL
Anesthesia: Monitor Anesthesia Care

## 2022-10-07 MED ORDER — PROPOFOL 10 MG/ML IV BOLUS
INTRAVENOUS | Status: DC | PRN
  Administered 2022-10-07: 90 mg via INTRAVENOUS

## 2022-10-07 MED ORDER — LACTATED RINGERS IV SOLN: INTRAVENOUS | Status: DC | PRN

## 2022-10-07 MED ORDER — PROPOFOL 500 MG/50ML IV EMUL
INTRAVENOUS | Status: DC | PRN
  Administered 2022-10-07: 150 ug/kg/min via INTRAVENOUS

## 2022-10-07 MED ORDER — SPOT INK MARKER SYRINGE KIT
PACK | SUBMUCOSAL | Status: AC
  Filled 2022-10-07: qty 5

## 2022-10-07 MED ORDER — SODIUM CHLORIDE 0.9 % IV SOLN: INTRAVENOUS | Status: DC

## 2022-10-07 SURGICAL SUPPLY — 22 items

## 2022-10-07 NOTE — H&P (Signed)
[facility] History and Physical   Primary Care Physician:  Philip Aspen, Limmie Patricia, MD   Reason for Procedure:   Colon cancer screening/family history of colon cancer  Plan:    Colonoscopy     HPI: Lindsay Brady is a 72 y.o. female undergoing screening colonoscopy.  Her father was diagnosed with colon cancer after age 63.  She has had 2 prior colonoscopies and does not believe she had any polyps.  She has chronic loose stools/diarrhea and is s/p cholecystectomy.  She did not not tolerate cholestyramine.    Past Medical History:  Diagnosis Date   Anxiety    Arthritis    Cholecystitis 07/01/2014   COPD (chronic obstructive pulmonary disease) (HCC)    Diabetes mellitus without complication (HCC)    type 2    Fracture 2013 ?   left leg   GERD (gastroesophageal reflux disease)    hx of gerd   Headache    Hx: UTI (urinary tract infection)    Hyperlipidemia    OA (osteoarthritis) of knee    Pneumonia    hx of viral pna years ago   Shortness of breath dyspnea    Sleep apnea    uses cpap   Urgency of urination     Past Surgical History:  Procedure Laterality Date   ABDOMINAL HYSTERECTOMY     CARPAL TUNNEL RELEASE     CHOLECYSTECTOMY N/A 07/19/2014   Procedure: LAPAROSCOPIC CHOLECYSTECTOMY WITH INTRAOPERATIVE CHOLANGIOGRAM;  Surgeon: Violeta Gelinas, MD;  Location: [facility] OR;  Service: General;  Laterality: N/A;   COLONOSCOPY  ?   2011   KNEE ARTHROSCOPY     TONSILLECTOMY      Prior to Admission medications   Medication Sig Start Date End Date Taking? Authorizing Provider  acetaminophen (TYLENOL) 500 MG tablet Take 500 mg by mouth every 6 (six) hours as needed for moderate pain or headache.   Yes [provider]  albuterol (VENTOLIN HFA) 108 (90 Base) MCG/ACT inhaler Inhale 2 puffs into the lungs every 6 (six) hours as needed for wheezing or shortness of breath. 08/13/20  Yes Philip Aspen, Limmie Patricia, MD  chlorpheniramine (CHLOR-TRIMETON) 4 MG  tablet Take 4 mg by mouth at bedtime as needed for allergies. 07/29/21  Yes [provider]  escitalopram (LEXAPRO) 10 MG tablet TAKE 1 TABLET BY MOUTH EVERY DAY Patient taking differently: Take 10 mg by mouth every evening. 08/12/22  Yes Philip Aspen, Limmie Patricia, MD  famotidine (PEPCID) 20 MG tablet Take one After breakfast and supper 01/20/21  Yes Nyoka Cowden, MD  Melatonin 10 MG TABS Take 10 mg by mouth at bedtime as needed (sleep).   Yes [provider]  MYRBETRIQ 50 MG TB24 tablet TAKE 1 TABLET BY MOUTH EVERY DAY Patient taking differently: Take 50 mg by mouth every evening. 04/12/22  Yes Philip Aspen, Limmie Patricia, MD  oxyCODONE-acetaminophen (PERCOCET) 7.5-325 MG tablet Take 1 tablet by mouth 3 (three) times daily as needed for moderate pain. 09/27/22  Yes [provider]  simvastatin (ZOCOR) 20 MG tablet TAKE 1 TABLET BY MOUTH EVERYDAY AT BEDTIME 09/27/22  Yes Philip Aspen, Limmie Patricia, MD  valsartan (DIOVAN) 40 MG tablet Take 1 tablet (40 mg total) by mouth daily. Patient taking differently: Take 40 mg by mouth every evening. 02/02/22  Yes Nyoka Cowden, MD  Vitamin D, Ergocalciferol, (DRISDOL) 1.25 MG (50000 UNIT) CAPS capsule Take 50,000 Units by mouth every Wednesday. 08/24/22  Yes [provider]  benzonatate (TESSALON)  200 MG capsule Take 1 capsule (200 mg total) by mouth 3 (three) times daily as needed for cough. 02/02/22   Nyoka Cowden, MD  ibuprofen (ADVIL,MOTRIN) 800 MG tablet Take 1 tablet (800 mg total) by mouth every 8 (eight) hours as needed. 10/28/16   Adonis Huguenin, NP  OXYGEN 2lpm with sleep and 3lpm with exertion [facility] Patient not taking: Reported on 09/21/2022    [provider]  Semaglutide (OZEMPIC, 0.25 OR 0.5 MG/DOSE, [place]) Inject 0.25 mg as directed every Sunday. 08/15/22   [provider]    Current Facility-Administered Medications  Medication Dose Route Frequency Provider Last Rate Last Admin   0.9 %  sodium  chloride infusion   Intravenous Continuous Jenel Lucks, MD        Allergies as of 09/01/2022 - Review Complete 07/06/2022  Allergen Reaction Noted   Macrodantin [nitrofurantoin]  07/06/2022   Bactrim [sulfamethoxazole-trimethoprim] Rash 07/18/2014   Erythromycin Other (See Comments) 07/18/2014   Fosamax  [alendronate sodium] Cough 09/13/2016    Family History  Problem Relation Age of Onset   Stroke Mother    Lung cancer Father        smoked   Colon cancer Father    Stomach cancer Paternal Grandmother    Cancer Paternal Grandmother    Stomach cancer Paternal Grandfather    Cancer Paternal Grandfather    Colon polyps Neg Hx    Esophageal cancer Neg Hx    Rectal cancer Neg Hx     Social History   Socioeconomic History   Marital status: Married    Spouse name: Not on file   Number of children: Not on file   Years of education: Not on file   Highest education level: Not on file  Occupational History   Not on file  Tobacco Use   Smoking status: Former    Packs/day: 2.00    Years: 30.00    Additional pack years: 0.00    Total pack years: 60.00    Types: Cigarettes    Quit date: 05/31/1996    Years since quitting: 26.3   Smokeless tobacco: Never  Vaping Use   Vaping Use: Never used  Substance and Sexual Activity   Alcohol use: No    Alcohol/week: 0.0 standard drinks of alcohol    Comment: rare   Drug use: No   Sexual activity: Not on file  Other Topics Concern   Not on file  Social History Narrative   Not on file   Social Determinants of Health   Financial Resource Strain: Not on file  Food Insecurity: Not on file  Transportation Needs: Not on file  Physical Activity: Not on file  Stress: Not on file  Social Connections: Not on file  Intimate Partner Violence: Not on file    Review of Systems:  All other review of systems negative except as mentioned in the HPI.  Physical Exam: Vital signs BP 139/74   Pulse 83   Temp (!) 97.1 F (36.2 C)  (Temporal)   Resp 16   Ht 5\' 4"  (1.626 m)   Wt 97.5 kg   SpO2 96%   BMI 36.90 kg/m   General:   Alert,  Well-developed, well-nourished, pleasant and cooperative in NAD Airway:  Mallampati 3 Lungs:  Clear throughout to auscultation.   Heart:  Regular rate and rhythm; no murmurs, clicks, rubs,  or gallops. Abdomen:  Soft, nontender and nondistended. Normal bowel sounds.   Neuro/Psych:  Normal mood and affect.  A and O x 3   Kacen Mellinger E. Tomasa Rand, MD [facility]

## 2022-10-07 NOTE — Anesthesia Postprocedure Evaluation (Signed)
Anesthesia Post Note  Patient: Birute Jurkovic  Procedure(s) Performed: COLONOSCOPY WITH PROPOFOL BIOPSY SUBMUCOSAL LIFTING INJECTION POLYPECTOMY SUBMUCOSAL TATTOO INJECTION HEMOSTASIS CLIP PLACEMENT     Patient location during evaluation: Endoscopy Anesthesia Type: MAC Level of consciousness: awake and alert Pain management: pain level controlled Vital Signs Assessment: post-procedure vital signs reviewed and stable Respiratory status: spontaneous breathing, nonlabored ventilation and respiratory function stable Cardiovascular status: stable and blood pressure returned to baseline Postop Assessment: no apparent nausea or vomiting Anesthetic complications: no  No notable events documented.  Last Vitals:  Vitals:   10/07/22 1039 10/07/22 1050  BP: (!) 111/96 118/64  Pulse: 76 73  Resp: 14 (!) 21  Temp: (!) 36.2 C   SpO2: 96% 99%    Last Pain:  Vitals:   10/07/22 1050  TempSrc:   PainSc: 0-No pain                 Kell Ferris,W. EDMOND

## 2022-10-07 NOTE — Discharge Instructions (Signed)
YOU HAD AN ENDOSCOPIC PROCEDURE TODAY: Refer to the procedure report and other information in the discharge instructions given to you for any specific questions about what was found during the examination. If this information does not answer your questions, please call [place] office at [phone] to clarify.   YOU SHOULD EXPECT: Some feelings of bloating in the abdomen. Passage of more gas than usual. Walking can help get rid of the air that was put into your GI tract during the procedure and reduce the bloating. If you had a lower endoscopy (such as a colonoscopy or flexible sigmoidoscopy) you may notice spotting of blood in your stool or on the toilet paper. Some abdominal soreness may be present for a day or two, also.  DIET: Your first meal following the procedure should be a light meal and then it is ok to progress to your normal diet. A half-sandwich or bowl of soup is an example of a good first meal. Heavy or fried foods are harder to digest and may make you feel nauseous or bloated. Drink plenty of fluids but you should avoid alcoholic beverages for 24 hours. If you had a esophageal dilation, please see attached instructions for diet.    ACTIVITY: Your care partner should take you home directly after the procedure. You should plan to take it easy, moving slowly for the rest of the day. You can resume normal activity the day after the procedure however YOU SHOULD NOT DRIVE, use power tools, machinery or perform tasks that involve climbing or major physical exertion for 24 hours (because of the sedation medicines used during the test).   SYMPTOMS TO REPORT IMMEDIATELY: A gastroenterologist can be reached at any hour. Please call [phone]  for any of the following symptoms:  Following lower endoscopy (colonoscopy, flexible sigmoidoscopy) Excessive amounts of blood in the stool  Significant tenderness, worsening of abdominal pains  Swelling of the abdomen that is new, acute  Fever of 100 or  higher  Following upper endoscopy (EGD, EUS, ERCP, esophageal dilation) Vomiting of blood or coffee ground material  New, significant abdominal pain  New, significant chest pain or pain under the shoulder blades  Painful or persistently difficult swallowing  New shortness of breath  Black, tarry-looking or red, bloody stools  FOLLOW UP:  If any biopsies were taken you will be contacted by phone or by letter within the next 1-3 weeks. Call [phone]  if you have not heard about the biopsies in 3 weeks.  Please also call with any specific questions about appointments or follow up tests. YOU HAD AN ENDOSCOPIC PROCEDURE TODAY: Refer to the procedure report and other information in the discharge instructions given to you for any specific questions about what was found during the examination. If this information does not answer your questions, please call [place] office at [phone] to clarify.   YOU SHOULD EXPECT: Some feelings of bloating in the abdomen. Passage of more gas than usual. Walking can help get rid of the air that was put into your GI tract during the procedure and reduce the bloating. If you had a lower endoscopy (such as a colonoscopy or flexible sigmoidoscopy) you may notice spotting of blood in your stool or on the toilet paper. Some abdominal soreness may be present for a day or two, also.  DIET: Your first meal following the procedure should be a light meal and then it is ok to progress to your normal diet. A half-sandwich or bowl of soup is an example of   a good first meal. Heavy or fried foods are harder to digest and may make you feel nauseous or bloated. Drink plenty of fluids but you should avoid alcoholic beverages for 24 hours. If you had a esophageal dilation, please see attached instructions for diet.    ACTIVITY: Your care partner should take you home directly after the procedure. You should plan to take it easy, moving slowly for the rest of the day. You can resume  normal activity the day after the procedure however YOU SHOULD NOT DRIVE, use power tools, machinery or perform tasks that involve climbing or major physical exertion for 24 hours (because of the sedation medicines used during the test).   SYMPTOMS TO REPORT IMMEDIATELY: A gastroenterologist can be reached at any hour. Please call [phone]  for any of the following symptoms:  Following lower endoscopy (colonoscopy, flexible sigmoidoscopy) Excessive amounts of blood in the stool  Significant tenderness, worsening of abdominal pains  Swelling of the abdomen that is new, acute  Fever of 100 or higher   FOLLOW UP:  If any biopsies were taken you will be contacted by phone or by letter within the next 1-3 weeks. Call [phone]  if you have not heard about the biopsies in 3 weeks.  Please also call with any specific questions about appointments or follow up tests.  

## 2022-10-07 NOTE — Anesthesia Preprocedure Evaluation (Addendum)
Anesthesia Evaluation  Patient identified by MRN, date of birth, ID band Patient awake    Reviewed: Allergy & Precautions, H&P , NPO status , Patient's Chart, lab work & pertinent test results  Airway Mallampati: III  TM Distance: >3 FB Neck ROM: Full    Dental no notable dental hx. (+) Teeth Intact, Dental Advisory Given   Pulmonary shortness of breath and with exertion, sleep apnea and Continuous Positive Airway Pressure Ventilation , COPD,  COPD inhaler and oxygen dependent, former smoker   Pulmonary exam normal breath sounds clear to auscultation       Cardiovascular negative cardio ROS  Rhythm:Regular Rate:Normal     Neuro/Psych  Headaches  Anxiety        GI/Hepatic Neg liver ROS,GERD  Medicated,,  Endo/Other  diabetes, Type 2, Oral Hypoglycemic Agents    Renal/GU negative Renal ROS  negative genitourinary   Musculoskeletal  (+) Arthritis , Osteoarthritis,    Abdominal   Peds  Hematology negative hematology ROS (+)   Anesthesia Other Findings   Reproductive/Obstetrics negative OB ROS                             Anesthesia Physical Anesthesia Plan  ASA: 3  Anesthesia Plan: MAC   Post-op Pain Management: Minimal or no pain anticipated   Induction: Intravenous  PONV Risk Score and Plan: 2 and Propofol infusion and Treatment may vary due to age or medical condition  Airway Management Planned: Natural Airway and Simple Face Mask  Additional Equipment:   Intra-op Plan:   Post-operative Plan:   Informed Consent: I have reviewed the patients History and Physical, chart, labs and discussed the procedure including the risks, benefits and alternatives for the proposed anesthesia with the patient or authorized representative who has indicated his/her understanding and acceptance.     Dental advisory given  Plan Discussed with: CRNA  Anesthesia Plan Comments:         Anesthesia Quick Evaluation

## 2022-10-07 NOTE — Op Note (Addendum)
[facility] Patient Name: Lindsay Brady Procedure Date : 10/07/2022 MRN: [id number] Attending MD: Dub Amis. Tomasa Rand , MD, [id number] Date of Birth: 10-Sep-1950 CSN: [id number] Age: 72 Admit Type: Outpatient Procedure:                Colonoscopy Indications:              Screening in patient at increased risk: Colorectal                            cancer in father 23 or older Providers:                Lillia Lengel E. Tomasa Rand, MD, Geralyn Corwin, RN, Janie                            Billups, Remus Blake CRNA Referring MD:             Dub Amis. Tomasa Rand, MD Medicines:                Monitored Anesthesia Care Complications:            No immediate complications. Estimated Blood Loss:     Estimated blood loss was minimal. Procedure:                Pre-Anesthesia Assessment:                           - Prior to the procedure, a History and Physical                            was performed, and patient medications and                            allergies were reviewed. The patient's tolerance of                            previous anesthesia was also reviewed. The risks                            and benefits of the procedure and the sedation                            options and risks were discussed with the patient.                            All questions were answered, and informed consent                            was obtained. Prior Anticoagulants: The patient has                            taken no anticoagulant or antiplatelet agents. ASA                            Grade Assessment: III - A patient with severe  systemic disease. After reviewing the risks and                            benefits, the patient was deemed in satisfactory                            condition to undergo the procedure.                           After obtaining informed consent, the colonoscope                            was passed under direct vision. Throughout  the                            procedure, the patient's blood pressure, pulse, and                            oxygen saturations were monitored continuously. The                            CF-HQ190L ([id number]) Olympus colonoscope was                            introduced through the anus and advanced to the the                            cecum, identified by appendiceal orifice and                            ileocecal valve. The colonoscopy was performed                            without difficulty. The patient tolerated the                            procedure well. The quality of the bowel                            preparation was adequate. The ileocecal valve,                            appendiceal orifice, and rectum were photographed. Scope In: 9:48:01 AM Scope Out: 10:31:32 AM Scope Withdrawal Time: 0 hours 32 minutes 24 seconds  Total Procedure Duration: 0 hours 43 minutes 31 seconds  Findings:      The perianal and digital rectal examinations were normal. Pertinent       negatives include normal sphincter tone and no palpable rectal lesions.      A 20 mm polyp was found in the hepatic flexure. The polyp was flat. The       polyp was injected with 4mL Everlift. The polyp was removed in a       piecemeal fashion with combination hot snare and cold snare. Resection       and retrieval were complete. Estimated blood loss  was minimal. To       prevent bleeding after the polypectomy, three hemostatic clips were       successfully placed (MR conditional). Clip manufacturer: [employer]. There was no bleeding at the end of the procedure. The wall       opposite the polypectomy site was injected of 3 mL of [place] ink for       tattoo.      A 3 mm polyp was found in the transverse colon. The polyp was sessile.       The polyp was removed with a cold snare. Resection and retrieval were       complete. Estimated blood loss was minimal.      Many large-mouthed and small-mouthed  diverticula were found in the       sigmoid colon. There was narrowing of the colon in association with the       diverticular opening. There was no evidence of diverticular bleeding.      Normal mucosa was found in the entire colon. Biopsies for histology were       taken with a cold forceps from the right colon for evaluation of       microscopic colitis. Estimated blood loss was minimal.      The exam was otherwise normal throughout the examined colon.      The retroflexed view of the distal rectum and anal verge was normal and       showed no anal or rectal abnormalities. Impression:               - One 20 mm polyp at the hepatic flexure, removed                            with a hot snare, removed with a cold snare,                            removed using lift and cut and a cold snare and                            removed piecemeal using a cold snare. Resected and                            retrieved. Clips (MR conditional) were placed. Clip                            manufacturer: [employer]. Tattooed.                           - One 3 mm polyp in the transverse colon, removed                            with a cold snare. Resected and retrieved.                           - Severe diverticulosis in the sigmoid colon. There                            was narrowing of the colon in  association with the                            diverticular opening. There was no evidence of                            diverticular bleeding.                           - Normal mucosa in the entire examined colon.                            Biopsied.                           - The distal rectum and anal verge are normal on                            retroflexion view. Moderate Sedation:      N/A Recommendation:           - Patient has a contact number available for                            emergencies. The signs and symptoms of potential                            delayed complications were  discussed with the                            patient. Return to normal activities tomorrow.                            Written discharge instructions were provided to the                            patient.                           - Resume previous diet.                           - Continue present medications.                           - Await pathology results.                           - Repeat colonoscopy in 9 months for surveillance                            after piecemeal polypectomy. Procedure Code(s):        --- Professional ---                           [phone], Colonoscopy, flexible; with removal of  tumor(s), polyp(s), or other lesion(s) by snare                            technique                           45381, Colonoscopy, flexible; with directed                            submucosal injection(s), any substance Diagnosis Code(s):        --- Professional ---                           Z80.0, Family history of malignant neoplasm of                            digestive organs                           D12.3, Benign neoplasm of transverse colon (hepatic                            flexure or splenic flexure)                           K57.30, Diverticulosis of large intestine without                            perforation or abscess without bleeding CPT copyright 2022 American Medical Association. All rights reserved. The codes documented in this report are preliminary and upon coder review may  be revised to meet current compliance requirements. Kahne Helfand E. Tomasa Rand, MD 10/07/2022 10:43:27 AM This report has been signed electronically. Number of Addenda: 0

## 2022-10-07 NOTE — Transfer of Care (Signed)
Immediate Anesthesia Transfer of Care Note  Patient: Lindsay Brady  Procedure(s) Performed: COLONOSCOPY WITH PROPOFOL BIOPSY SUBMUCOSAL LIFTING INJECTION POLYPECTOMY SUBMUCOSAL TATTOO INJECTION  Patient Location: Endoscopy Unit  Anesthesia Type:MAC  Level of Consciousness: drowsy  Airway & Oxygen Therapy: Patient Spontanous Breathing  Post-op Assessment: Report given to RN and Post -op Vital signs reviewed and stable  Post vital signs: Reviewed and stable  Last Vitals:  Vitals Value Taken Time  BP 108/58 10/07/22 1040  Temp 36.2 C 10/07/22 1039  Pulse 73 10/07/22 1043  Resp 15 10/07/22 1043  SpO2 96 % 10/07/22 1043  Vitals shown include unvalidated device data.  Last Pain:  Vitals:   10/07/22 1039  TempSrc: Temporal  PainSc: Asleep         Complications: No notable events documented.

## 2022-10-08 LAB — SURGICAL PATHOLOGY

## 2022-10-10 ENCOUNTER — Encounter (HOSPITAL_COMMUNITY): Payer: Self-pay | Admitting: Gastroenterology

## 2022-10-11 NOTE — Progress Notes (Signed)
Lindsay Brady,  The large polyp that I removed during your recent procedure was completely benign but was proven to be a "pre-cancerous" polyp that MAY have grown into cancers if it had not been removed.  Studies shows that at least 20% of women over age 72 and 30% of men over age 67 have pre-cancerous polyps.  Based on current nationally recognized surveillance guidelines, I recommend that you have a repeat colonoscopy in 9 months to re-examine the piecemeal polypectomy site of a large precancerous polyp.  Because of your lung disease, this will again need to be performed in the hospital setting.   If you develop any new rectal bleeding, abdominal pain or significant bowel habit changes, please contact me before then.

## 2022-10-12 DIAGNOSIS — J449 Chronic obstructive pulmonary disease, unspecified: Secondary | ICD-10-CM | POA: Diagnosis not present

## 2022-10-12 DIAGNOSIS — R0602 Shortness of breath: Secondary | ICD-10-CM | POA: Diagnosis not present

## 2022-10-19 ENCOUNTER — Other Ambulatory Visit: Payer: Self-pay | Admitting: Internal Medicine

## 2022-10-19 DIAGNOSIS — E1169 Type 2 diabetes mellitus with other specified complication: Secondary | ICD-10-CM

## 2022-10-19 DIAGNOSIS — G4733 Obstructive sleep apnea (adult) (pediatric): Secondary | ICD-10-CM | POA: Diagnosis not present

## 2022-10-25 DIAGNOSIS — E119 Type 2 diabetes mellitus without complications: Secondary | ICD-10-CM | POA: Diagnosis not present

## 2022-10-25 DIAGNOSIS — Z79899 Other long term (current) drug therapy: Secondary | ICD-10-CM | POA: Diagnosis not present

## 2022-10-25 DIAGNOSIS — E7800 Pure hypercholesterolemia, unspecified: Secondary | ICD-10-CM | POA: Diagnosis not present

## 2022-10-25 DIAGNOSIS — I10 Essential (primary) hypertension: Secondary | ICD-10-CM | POA: Diagnosis not present

## 2022-10-25 DIAGNOSIS — E559 Vitamin D deficiency, unspecified: Secondary | ICD-10-CM | POA: Diagnosis not present

## 2022-10-25 DIAGNOSIS — M25512 Pain in left shoulder: Secondary | ICD-10-CM | POA: Diagnosis not present

## 2022-10-25 DIAGNOSIS — G8929 Other chronic pain: Secondary | ICD-10-CM | POA: Diagnosis not present

## 2022-10-25 DIAGNOSIS — M545 Low back pain, unspecified: Secondary | ICD-10-CM | POA: Diagnosis not present

## 2022-11-12 DIAGNOSIS — J449 Chronic obstructive pulmonary disease, unspecified: Secondary | ICD-10-CM | POA: Diagnosis not present

## 2022-11-12 DIAGNOSIS — R0602 Shortness of breath: Secondary | ICD-10-CM | POA: Diagnosis not present

## 2022-11-14 ENCOUNTER — Other Ambulatory Visit: Payer: Self-pay | Admitting: Internal Medicine

## 2022-11-14 DIAGNOSIS — F419 Anxiety disorder, unspecified: Secondary | ICD-10-CM

## 2022-11-16 ENCOUNTER — Other Ambulatory Visit: Payer: Self-pay | Admitting: Internal Medicine

## 2022-11-16 DIAGNOSIS — E1169 Type 2 diabetes mellitus with other specified complication: Secondary | ICD-10-CM

## 2022-11-19 DIAGNOSIS — G4733 Obstructive sleep apnea (adult) (pediatric): Secondary | ICD-10-CM | POA: Diagnosis not present

## 2022-11-23 DIAGNOSIS — E119 Type 2 diabetes mellitus without complications: Secondary | ICD-10-CM | POA: Diagnosis not present

## 2022-11-23 DIAGNOSIS — E559 Vitamin D deficiency, unspecified: Secondary | ICD-10-CM | POA: Diagnosis not present

## 2022-11-23 DIAGNOSIS — M545 Low back pain, unspecified: Secondary | ICD-10-CM | POA: Diagnosis not present

## 2022-11-23 DIAGNOSIS — M25512 Pain in left shoulder: Secondary | ICD-10-CM | POA: Diagnosis not present

## 2022-11-23 DIAGNOSIS — E7800 Pure hypercholesterolemia, unspecified: Secondary | ICD-10-CM | POA: Diagnosis not present

## 2022-11-23 DIAGNOSIS — Z79899 Other long term (current) drug therapy: Secondary | ICD-10-CM | POA: Diagnosis not present

## 2022-11-23 DIAGNOSIS — I10 Essential (primary) hypertension: Secondary | ICD-10-CM | POA: Diagnosis not present

## 2022-11-23 DIAGNOSIS — G8929 Other chronic pain: Secondary | ICD-10-CM | POA: Diagnosis not present

## 2022-11-25 DIAGNOSIS — Z79899 Other long term (current) drug therapy: Secondary | ICD-10-CM | POA: Diagnosis not present

## 2022-12-12 DIAGNOSIS — R0602 Shortness of breath: Secondary | ICD-10-CM | POA: Diagnosis not present

## 2022-12-12 DIAGNOSIS — J449 Chronic obstructive pulmonary disease, unspecified: Secondary | ICD-10-CM | POA: Diagnosis not present

## 2022-12-15 ENCOUNTER — Other Ambulatory Visit: Payer: Self-pay | Admitting: Internal Medicine

## 2022-12-15 DIAGNOSIS — E1169 Type 2 diabetes mellitus with other specified complication: Secondary | ICD-10-CM

## 2022-12-19 DIAGNOSIS — G4733 Obstructive sleep apnea (adult) (pediatric): Secondary | ICD-10-CM | POA: Diagnosis not present

## 2022-12-20 ENCOUNTER — Other Ambulatory Visit: Payer: Self-pay | Admitting: Internal Medicine

## 2022-12-20 DIAGNOSIS — F419 Anxiety disorder, unspecified: Secondary | ICD-10-CM

## 2022-12-23 DIAGNOSIS — R0602 Shortness of breath: Secondary | ICD-10-CM | POA: Diagnosis not present

## 2022-12-23 DIAGNOSIS — E119 Type 2 diabetes mellitus without complications: Secondary | ICD-10-CM | POA: Diagnosis not present

## 2022-12-23 DIAGNOSIS — M545 Low back pain, unspecified: Secondary | ICD-10-CM | POA: Diagnosis not present

## 2022-12-23 DIAGNOSIS — E7800 Pure hypercholesterolemia, unspecified: Secondary | ICD-10-CM | POA: Diagnosis not present

## 2022-12-23 DIAGNOSIS — E559 Vitamin D deficiency, unspecified: Secondary | ICD-10-CM | POA: Diagnosis not present

## 2022-12-23 DIAGNOSIS — G8929 Other chronic pain: Secondary | ICD-10-CM | POA: Diagnosis not present

## 2022-12-23 DIAGNOSIS — M25512 Pain in left shoulder: Secondary | ICD-10-CM | POA: Diagnosis not present

## 2022-12-23 DIAGNOSIS — I10 Essential (primary) hypertension: Secondary | ICD-10-CM | POA: Diagnosis not present

## 2022-12-23 DIAGNOSIS — Z1231 Encounter for screening mammogram for malignant neoplasm of breast: Secondary | ICD-10-CM | POA: Diagnosis not present

## 2023-01-12 DIAGNOSIS — R0602 Shortness of breath: Secondary | ICD-10-CM | POA: Diagnosis not present

## 2023-01-12 DIAGNOSIS — J449 Chronic obstructive pulmonary disease, unspecified: Secondary | ICD-10-CM | POA: Diagnosis not present

## 2023-01-19 ENCOUNTER — Other Ambulatory Visit: Payer: Self-pay | Admitting: Internal Medicine

## 2023-01-19 DIAGNOSIS — G4733 Obstructive sleep apnea (adult) (pediatric): Secondary | ICD-10-CM | POA: Diagnosis not present

## 2023-01-19 NOTE — Telephone Encounter (Signed)
Last filled by Nyoka Cowden, MD. Molli Knock to refill?

## 2023-01-21 DIAGNOSIS — E559 Vitamin D deficiency, unspecified: Secondary | ICD-10-CM | POA: Diagnosis not present

## 2023-01-21 DIAGNOSIS — E119 Type 2 diabetes mellitus without complications: Secondary | ICD-10-CM | POA: Diagnosis not present

## 2023-01-21 DIAGNOSIS — R0602 Shortness of breath: Secondary | ICD-10-CM | POA: Diagnosis not present

## 2023-01-21 DIAGNOSIS — E7800 Pure hypercholesterolemia, unspecified: Secondary | ICD-10-CM | POA: Diagnosis not present

## 2023-01-21 DIAGNOSIS — M549 Dorsalgia, unspecified: Secondary | ICD-10-CM | POA: Diagnosis not present

## 2023-01-21 DIAGNOSIS — G8929 Other chronic pain: Secondary | ICD-10-CM | POA: Diagnosis not present

## 2023-01-21 DIAGNOSIS — I10 Essential (primary) hypertension: Secondary | ICD-10-CM | POA: Diagnosis not present

## 2023-01-21 DIAGNOSIS — Z1231 Encounter for screening mammogram for malignant neoplasm of breast: Secondary | ICD-10-CM | POA: Diagnosis not present

## 2023-01-21 DIAGNOSIS — M25512 Pain in left shoulder: Secondary | ICD-10-CM | POA: Diagnosis not present

## 2023-01-25 ENCOUNTER — Telehealth: Payer: Self-pay | Admitting: Gastroenterology

## 2023-01-25 NOTE — Telephone Encounter (Signed)
Returned patient call letting her know that she is not due for a Colonoscopy until 2/25

## 2023-01-25 NOTE — Telephone Encounter (Signed)
Patient called stated she received a message to call the office to schedule hospital procedure.

## 2023-01-25 NOTE — Telephone Encounter (Signed)
Maya did you perhaps call this pt?

## 2023-01-26 DIAGNOSIS — G4733 Obstructive sleep apnea (adult) (pediatric): Secondary | ICD-10-CM | POA: Diagnosis not present

## 2023-01-27 ENCOUNTER — Encounter: Payer: Self-pay | Admitting: Nurse Practitioner

## 2023-01-27 ENCOUNTER — Ambulatory Visit: Payer: Medicare Other | Admitting: Nurse Practitioner

## 2023-01-27 ENCOUNTER — Ambulatory Visit (INDEPENDENT_AMBULATORY_CARE_PROVIDER_SITE_OTHER): Payer: Medicare Other

## 2023-01-27 ENCOUNTER — Ambulatory Visit: Payer: Medicare Other

## 2023-01-27 VITALS — BP 100/60 | HR 70 | Ht 65.0 in | Wt 207.8 lb

## 2023-01-27 DIAGNOSIS — J9611 Chronic respiratory failure with hypoxia: Secondary | ICD-10-CM

## 2023-01-27 DIAGNOSIS — J849 Interstitial pulmonary disease, unspecified: Secondary | ICD-10-CM

## 2023-01-27 DIAGNOSIS — J449 Chronic obstructive pulmonary disease, unspecified: Secondary | ICD-10-CM | POA: Diagnosis not present

## 2023-01-27 DIAGNOSIS — J984 Other disorders of lung: Secondary | ICD-10-CM | POA: Diagnosis not present

## 2023-01-27 DIAGNOSIS — G4733 Obstructive sleep apnea (adult) (pediatric): Secondary | ICD-10-CM | POA: Diagnosis not present

## 2023-01-27 NOTE — Assessment & Plan Note (Signed)
Stable from a symptom standpoint. Has not had recent PFTs. Will order these for surveillance monitoring. Repeat CXR today. If evidence of progression, will need repeat CT imaging. Encouraged to remain active.  Patient Instructions  Continue Albuterol inhaler 2 puffs every 6 hours as needed for shortness of breath or wheezing. Notify if symptoms persist despite rescue inhaler/neb use. Continue chlorpheniramine 4 mg at bedtime for cough Continue pepcid 10 mg Twice daily  Continue loratidine 10 mg daily  Continue supplemental oxygen 2 lpm as needed with activity for goal >88-90%   Continue to use CPAP every night, minimum of 4-6 hours a night.  Change equipment every 30 days or as directed by DME. Wash your tubing with warm soap and water daily, hang to dry. Wash humidifier portion weekly.  Be aware of reduced alertness and do not drive or operate heavy machinery if experiencing this or drowsiness.  Exercise encouraged, as tolerated. Notify if persistent daytime sleepiness occurs even with consistent use of CPAP.  Repeat pulmonary function test for monitoring  Chest x ray today    Follow up in 4 months with Dr. Sherene Sires after pulmonary function testing. If symptoms do not improve or worsen, please contact office for sooner follow up or seek emergency care.

## 2023-01-27 NOTE — Assessment & Plan Note (Signed)
Stable without increased O2 requirement. Walk test today without desaturations on room air so was unable to qualify for POC. She says that she does have drops into the 80's at home without it when she is doing more strenuous activity. Recovers with 2 lpm O2. Advised her to continue monitoring at home for goal >88-90%

## 2023-01-27 NOTE — Assessment & Plan Note (Signed)
Compensated on current regimen. No recent exacerbations or hospitalizations. Utilizing SABA every few weeks. No scheduled BD. Action plan in place.

## 2023-01-27 NOTE — Patient Instructions (Addendum)
Continue Albuterol inhaler 2 puffs every 6 hours as needed for shortness of breath or wheezing. Notify if symptoms persist despite rescue inhaler/neb use. Continue chlorpheniramine 4 mg at bedtime for cough Continue pepcid 10 mg Twice daily  Continue loratidine 10 mg daily  Continue supplemental oxygen 2 lpm as needed with activity for goal >88-90%   Continue to use CPAP every night, minimum of 4-6 hours a night.  Change equipment every 30 days or as directed by DME. Wash your tubing with warm soap and water daily, hang to dry. Wash humidifier portion weekly.  Be aware of reduced alertness and do not drive or operate heavy machinery if experiencing this or drowsiness.  Exercise encouraged, as tolerated. Notify if persistent daytime sleepiness occurs even with consistent use of CPAP.  Repeat pulmonary function test for monitoring  Chest x ray today    Follow up in 4 months with Dr. Sherene Sires after pulmonary function testing. If symptoms do not improve or worsen, please contact office for sooner follow up or seek emergency care.

## 2023-01-27 NOTE — Progress Notes (Signed)
@Patient  ID: Lindsay Brady, female    DOB: April 30, 1951, 72 y.o.   MRN: [id number]  Chief Complaint  Patient presents with   Follow-up    Pt is here for CPAP Supplies.    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  She is a patient of Dr. Thurston Hole and last seen in office 06/22/2022.  Past medical history significant for right bundle branch block, OSA on CPAP, DM, obesity, anxiety.  TEST/EVENTS:  05/18/2016 PFT: FVC 63, FEV1 74, ratio 91, TLC 57, DLCOunc 52 11/03/2021 echo:EF 60-65%. GIDD. RV size and function nl. Normal PASP.   03/16/2022: OV with dr. Sherene Sires. Maintained on PRN SABA. Cough better after d/c ACEi. CPAP is broken. Walked on room air with SpO2 low 89%. No evidence of progression of PF c/w prior macrodantin exp > main reversible problems now are conditioning and weight loss. Referred for new CPAP.   06/22/2022: Ov with Jerrie Schussler NP for follow up after receiving new CPAP machine. She is doing well on this. Feels like she sleeps well. No concerns or complaints with it. She denies excessive daytime fatigue or morning headaches. No issues with drowsy driving. She is having some large leaks. She doesn't seem to notice this. Her breathing has been stable. Denies any cough or chest congestion.  05/23/2022-06/21/2022: CPAP auto 7-20 cmH2O 30/30 days; 97% >4 hr; av usage 7 hr 13 min Pressure 95th 10.6 Leaks 95th 52.5 AHI 0.3  01/27/2023: Today - follow up Patient presents today for follow up. She has been feeling about the same as her last visit. Breathing stable. She mainly gets short winded with stair climbing or house chores. She's able to complete ADLs without difficulty. She has an occasional cough, unchanged from her baseline. Usually just clear phlegm. She denies chest congestion, lower extremity swelling, orthopnea, wheezing. No low O2  levels at home. Uses albuterol every 2 weeks or so. She does occasionally use 2 lpm with exertion. She wears CPAP nightly. Receives benefit from use. No drowsy driving. Needs new supplies.   12/28/2022-01/26/2023: CPAP 7-10 cmH2O 27/30 days; 90% >4 hr; average use 6 hr 41 min Pressure 95th 9.9 Leaks 47.5 AHI 0.1   Allergies  Allergen Reactions   Macrodantin [Nitrofurantoin]     Scars lung   Bactrim [Sulfamethoxazole-Trimethoprim] Rash   Erythromycin Other (See Comments)    Pain in stomach    Fosamax  [Alendronate Sodium] Cough    After taking 1 pill, pt states that she had a cough.     Immunization History  Administered Date(s) Administered   Fluad Quad(high Dose 65+) 03/09/2019, 01/14/2021, 03/16/2022   Influenza Whole 02/29/2016   Influenza, High Dose Seasonal PF 03/10/2016, 03/15/2017, 03/15/2017, 03/27/2018, 04/15/2020   Influenza, Seasonal, Injecte, Preservative Fre 03/12/2013, 03/19/2014   Influenza,inj,Quad PF,6+ Mos 03/10/2016   Influenza,trivalent, recombinat, inj, PF 05/04/2012   Moderna Sars-Covid-2 Vaccination 06/26/2019, 07/24/2019, 04/15/2020, 09/23/2020   Pneumococcal Conjugate-13 06/22/2018   Pneumococcal Polysaccharide-23 07/24/2010   Rsv, Bivalent, Protein Subunit Rsvpref,pf (Abrysvo) 07/02/2022   Td 01/09/2002   Td (Adult),5 Lf Tetanus Toxid, Preservative Free 07/13/2022   Tdap 12/30/2011   Zoster Recombinant(Shingrix) 07/13/2022   Zoster, Live 08/23/2011    Past Medical History:  Diagnosis Date   Anxiety    Arthritis    Cholecystitis 07/01/2014   COPD (chronic obstructive pulmonary disease) (HCC)    Diabetes mellitus without complication (HCC)  type 2    Fracture 2013 ?   left leg   GERD (gastroesophageal reflux disease)    hx of gerd   Headache    Hx: UTI (urinary tract infection)    Hyperlipidemia    OA (osteoarthritis) of knee    Pneumonia    hx of viral pna years ago   Shortness of breath dyspnea    Sleep apnea    uses cpap   Urgency  of urination     Tobacco History: Social History   Tobacco Use  Smoking Status Former   Current packs/day: 0.00   Average packs/day: 2.0 packs/day for 30.0 years (60.0 ttl pk-yrs)   Types: Cigarettes   Start date: 05/31/1966   Quit date: 05/31/1996   Years since quitting: 26.6  Smokeless Tobacco Never   Counseling given: Not Answered   Outpatient Medications Prior to Visit  Medication Sig Dispense Refill   acetaminophen (TYLENOL) 500 MG tablet Take 500 mg by mouth every 6 (six) hours as needed for moderate pain or headache.     albuterol (VENTOLIN HFA) 108 (90 Base) MCG/ACT inhaler Inhale 2 puffs into the lungs every 6 (six) hours as needed for wheezing or shortness of breath. 1 each 11   benzonatate (TESSALON) 200 MG capsule Take 1 capsule (200 mg total) by mouth 3 (three) times daily as needed for cough. 30 capsule 1   chlorpheniramine (CHLOR-TRIMETON) 4 MG tablet Take 4 mg by mouth at bedtime as needed for allergies.     escitalopram (LEXAPRO) 10 MG tablet TAKE 1 TABLET BY MOUTH EVERY DAY 30 tablet 0   famotidine (PEPCID) 20 MG tablet Take one After breakfast and supper     ibuprofen (ADVIL,MOTRIN) 800 MG tablet Take 1 tablet (800 mg total) by mouth every 8 (eight) hours as needed. 90 tablet 0   Melatonin 10 MG TABS Take 10 mg by mouth at bedtime as needed (sleep).     MYRBETRIQ 50 MG TB24 tablet TAKE 1 TABLET BY MOUTH EVERY DAY (Patient taking differently: Take 50 mg by mouth every evening.) 90 tablet 1   oxyCODONE-acetaminophen (PERCOCET) 7.5-325 MG tablet Take 1 tablet by mouth 3 (three) times daily as needed for moderate pain.     OXYGEN 2lpm with sleep and 3lpm with exertion A[facility]     Semaglutide (OZEMPIC, 0.25 OR 0.5 MG/DOSE, [place]) Inject 0.25 mg as directed every Sunday.     simvastatin (ZOCOR) 20 MG tablet TAKE 1 TABLET BY MOUTH EVERYDAY AT BEDTIME 90 tablet 0   valsartan (DIOVAN) 40 MG tablet TAKE 1 TABLET BY MOUTH EVERY DAY 90 tablet 1   Vitamin D, Ergocalciferol, (DRISDOL)  1.25 MG (50000 UNIT) CAPS capsule Take 50,000 Units by mouth every Wednesday.     No facility-administered medications prior to visit.     Review of Systems:   Constitutional: No weight loss or gain, night sweats, fevers, chills, baseline HEENT: No headaches, difficulty swallowing, tooth/dental problems. No sneezing, itching, ear ache, nasal congestion/drainage, sore throat, hoarse voice CV:  No chest pain, orthopnea, PND, swelling in lower extremities, anasarca, dizziness, palpitations, syncope Resp:  +shortness of breath with exertion; occasional cough.  No hemoptysis. No wheezing.  No chest wall deformity Skin: No rash, lesions, ulcerations MSK:  No joint pain or swelling.  No decreased range of motion.  No back pain. Neuro: No dizziness or lightheadedness.  Psych: No depression or anxiety. Mood stable.     Physical Exam:  BP 100/60 (BP Location: Right Arm, Cuff Size:  Large)   Pulse 70   Ht 5\' 5"  (1.651 m)   Wt 207 lb 12.8 oz (94.3 kg)   SpO2 97%   BMI 34.58 kg/m   GEN: Pleasant, interactive, well-kempt; obese; in no acute distress HEENT:  Normocephalic and atraumatic. EACs patent bilaterally. TM pearly gray with present light reflex bilaterally. PERRLA. Sclera white. Nasal turbinates pink, moist and patent bilaterally.  No rhinorrhea present. Oropharynx pink and moist, without exudate or edema. No lesions, ulcerations. Mallampati II NECK:  Supple w/ fair ROM. No JVD present. Normal carotid impulses w/o bruits. Thyroid symmetrical with no goiter or nodules palpated. No lymphadenopathy.   CV: RRR, no m/r/g, no peripheral edema. Pulses intact, +2 bilaterally. No cyanosis, pallor or clubbing. PULMONARY:  Unlabored, regular breathing. Clear bilaterally A&P w/o wheezes/rales/rhonchi. No accessory muscle use. No dullness to percussion. GI: BS present and normoactive. Soft, non-tender to palpation. No organomegaly or masses detected MSK: No erythema, warmth or tenderness. Cap refil <2  sec all extrem. No deformities or joint swelling noted.  Neuro: A/Ox3. No focal deficits noted.   Skin: Warm, no lesions or rashe Psych: Normal affect and behavior. Judgement and thought content appropriate.     Lab Results:  CBC    Component Value Date/Time   WBC 6.9 08/24/2021 1419   RBC 4.17 08/24/2021 1419   HGB 13.4 08/24/2021 1419   HCT 39.5 08/24/2021 1419   PLT 219.0 08/24/2021 1419   MCV 94.8 08/24/2021 1419   MCH 31.7 07/21/2014 0430   MCHC 33.9 08/24/2021 1419   RDW 13.9 08/24/2021 1419   LYMPHSABS 2.7 08/24/2021 1419   MONOABS 0.5 08/24/2021 1419   EOSABS 0.1 08/24/2021 1419   BASOSABS 0.0 08/24/2021 1419    BMET    Component Value Date/Time   NA 137 08/24/2021 1419   K 4.0 08/24/2021 1419   CL 102 08/24/2021 1419   CO2 28 08/24/2021 1419   GLUCOSE 101 (H) 08/24/2021 1419   BUN 20 08/24/2021 1419   CREATININE 0.80 08/24/2021 1419   CALCIUM 9.8 08/24/2021 1419   GFRNONAA 68 (L) 07/21/2014 0430   GFRAA 79 (L) 07/21/2014 0430    BNP No results found for: "BNP"   Imaging:  No results found.  Administration History     None          Latest Ref Rng & Units 05/18/2016   10:56 AM  PFT Results  FVC-Pre L 2.07   FVC-Predicted Pre % 63   FVC-Post L 2.09   FVC-Predicted Post % 63   Pre FEV1/FVC % % 90   Post FEV1/FCV % % 91   FEV1-Pre L 1.87   FEV1-Predicted Pre % 74   FEV1-Post L 1.90   DLCO uncorrected ml/min/mmHg 13.53   DLCO UNC% % 52   DLVA Predicted % 98   TLC L 2.98   TLC % Predicted % 57   RV % Predicted % 38     No results found for: "NITRICOXIDE"      Assessment & Plan:   ILD (interstitial lung disease) (HCC)  c/w NSIP p macrodantin exp Stable from a symptom standpoint. Has not had recent PFTs. Will order these for surveillance monitoring. Repeat CXR today. If evidence of progression, will need repeat CT imaging. Encouraged to remain active.  Patient Instructions  Continue Albuterol inhaler 2 puffs every 6 hours as  needed for shortness of breath or wheezing. Notify if symptoms persist despite rescue inhaler/neb use. Continue chlorpheniramine 4 mg at bedtime for cough  Continue pepcid 10 mg Twice daily  Continue loratidine 10 mg daily  Continue supplemental oxygen 2 lpm as needed with activity for goal >88-90%   Continue to use CPAP every night, minimum of 4-6 hours a night.  Change equipment every 30 days or as directed by DME. Wash your tubing with warm soap and water daily, hang to dry. Wash humidifier portion weekly.  Be aware of reduced alertness and do not drive or operate heavy machinery if experiencing this or drowsiness.  Exercise encouraged, as tolerated. Notify if persistent daytime sleepiness occurs even with consistent use of CPAP.  Repeat pulmonary function test for monitoring  Chest x ray today    Follow up in 4 months with Dr. Sherene Sires after pulmonary function testing. If symptoms do not improve or worsen, please contact office for sooner follow up or seek emergency care.   COPD GOLD 0 Compensated on current regimen. No recent exacerbations or hospitalizations. Utilizing SABA every few weeks. No scheduled BD. Action plan in place.   Chronic respiratory failure with hypoxia (HCC) Stable without increased O2 requirement. Walk test today without desaturations on room air so was unable to qualify for POC. She says that she does have drops into the 80's at home without it when she is doing more strenuous activity. Recovers with 2 lpm O2. Advised her to continue monitoring at home for goal >88-90%  OSA on CPAP Excellent compliance and receives benefit from use. Controlled on current download. Still has some moderate leaks but they are not bothersome to her. Aware of safe driving practices. Will send order for new CPAP supplies.      I spent 32 minutes of dedicated to the care of this patient on the date of this encounter to include pre-visit review of records, face-to-face time with the  patient discussing conditions above, post visit ordering of testing, clinical documentation with the electronic health record, making appropriate referrals as documented, and communicating necessary findings to members of the patients care team.  Noemi Chapel, NP 01/28/2023  Pt aware and understands NP's role.

## 2023-01-27 NOTE — Assessment & Plan Note (Signed)
Excellent compliance and receives benefit from use. Controlled on current download. Still has some moderate leaks but they are not bothersome to her. Aware of safe driving practices. Will send order for new CPAP supplies.

## 2023-01-28 ENCOUNTER — Encounter: Payer: Self-pay | Admitting: Nurse Practitioner

## 2023-02-03 ENCOUNTER — Other Ambulatory Visit: Payer: Self-pay | Admitting: Oncology

## 2023-02-03 DIAGNOSIS — Z006 Encounter for examination for normal comparison and control in clinical research program: Secondary | ICD-10-CM

## 2023-02-23 DIAGNOSIS — E7800 Pure hypercholesterolemia, unspecified: Secondary | ICD-10-CM | POA: Diagnosis not present

## 2023-02-23 DIAGNOSIS — M25512 Pain in left shoulder: Secondary | ICD-10-CM | POA: Diagnosis not present

## 2023-02-23 DIAGNOSIS — I10 Essential (primary) hypertension: Secondary | ICD-10-CM | POA: Diagnosis not present

## 2023-02-23 DIAGNOSIS — Z79899 Other long term (current) drug therapy: Secondary | ICD-10-CM | POA: Diagnosis not present

## 2023-02-23 DIAGNOSIS — E559 Vitamin D deficiency, unspecified: Secondary | ICD-10-CM | POA: Diagnosis not present

## 2023-02-23 DIAGNOSIS — Z1231 Encounter for screening mammogram for malignant neoplasm of breast: Secondary | ICD-10-CM | POA: Diagnosis not present

## 2023-02-23 DIAGNOSIS — R0602 Shortness of breath: Secondary | ICD-10-CM | POA: Diagnosis not present

## 2023-02-23 DIAGNOSIS — M549 Dorsalgia, unspecified: Secondary | ICD-10-CM | POA: Diagnosis not present

## 2023-02-23 DIAGNOSIS — E119 Type 2 diabetes mellitus without complications: Secondary | ICD-10-CM | POA: Diagnosis not present

## 2023-03-11 ENCOUNTER — Other Ambulatory Visit: Payer: Self-pay | Admitting: Internal Medicine

## 2023-03-11 DIAGNOSIS — Z1231 Encounter for screening mammogram for malignant neoplasm of breast: Secondary | ICD-10-CM

## 2023-03-13 ENCOUNTER — Other Ambulatory Visit: Payer: Self-pay | Admitting: Internal Medicine

## 2023-03-13 DIAGNOSIS — F419 Anxiety disorder, unspecified: Secondary | ICD-10-CM

## 2023-03-16 ENCOUNTER — Other Ambulatory Visit: Payer: Self-pay | Admitting: Internal Medicine

## 2023-03-16 DIAGNOSIS — E1169 Type 2 diabetes mellitus with other specified complication: Secondary | ICD-10-CM

## 2023-03-17 ENCOUNTER — Encounter: Payer: Self-pay | Admitting: Internal Medicine

## 2023-03-17 DIAGNOSIS — H31002 Unspecified chorioretinal scars, left eye: Secondary | ICD-10-CM | POA: Diagnosis not present

## 2023-03-17 DIAGNOSIS — H52203 Unspecified astigmatism, bilateral: Secondary | ICD-10-CM | POA: Diagnosis not present

## 2023-03-17 DIAGNOSIS — E119 Type 2 diabetes mellitus without complications: Secondary | ICD-10-CM | POA: Diagnosis not present

## 2023-03-17 LAB — HM DIABETES EYE EXAM

## 2023-03-18 ENCOUNTER — Ambulatory Visit
Admission: RE | Admit: 2023-03-18 | Discharge: 2023-03-18 | Disposition: A | Discharge status: Home / Self Care | Payer: Medicare Other | Source: Ambulatory Visit | Attending: Internal Medicine | Admitting: Internal Medicine

## 2023-03-18 DIAGNOSIS — Z1231 Encounter for screening mammogram for malignant neoplasm of breast: Secondary | ICD-10-CM | POA: Diagnosis not present

## 2023-03-23 DIAGNOSIS — I10 Essential (primary) hypertension: Secondary | ICD-10-CM | POA: Diagnosis not present

## 2023-03-23 DIAGNOSIS — Z79899 Other long term (current) drug therapy: Secondary | ICD-10-CM | POA: Diagnosis not present

## 2023-03-23 DIAGNOSIS — M25512 Pain in left shoulder: Secondary | ICD-10-CM | POA: Diagnosis not present

## 2023-03-23 DIAGNOSIS — Z1231 Encounter for screening mammogram for malignant neoplasm of breast: Secondary | ICD-10-CM | POA: Diagnosis not present

## 2023-03-23 DIAGNOSIS — G8929 Other chronic pain: Secondary | ICD-10-CM | POA: Diagnosis not present

## 2023-03-23 DIAGNOSIS — Z5320 Procedure and treatment not carried out because of patient's decision for unspecified reasons: Secondary | ICD-10-CM | POA: Diagnosis not present

## 2023-03-23 DIAGNOSIS — E119 Type 2 diabetes mellitus without complications: Secondary | ICD-10-CM | POA: Diagnosis not present

## 2023-03-23 DIAGNOSIS — M549 Dorsalgia, unspecified: Secondary | ICD-10-CM | POA: Diagnosis not present

## 2023-03-25 ENCOUNTER — Other Ambulatory Visit: Payer: Self-pay | Admitting: Internal Medicine

## 2023-03-25 DIAGNOSIS — F419 Anxiety disorder, unspecified: Secondary | ICD-10-CM

## 2023-03-25 DIAGNOSIS — Z79899 Other long term (current) drug therapy: Secondary | ICD-10-CM | POA: Diagnosis not present

## 2023-03-30 ENCOUNTER — Ambulatory Visit (HOSPITAL_BASED_OUTPATIENT_CLINIC_OR_DEPARTMENT_OTHER): Payer: Medicare Other | Admitting: Internal Medicine

## 2023-03-30 DIAGNOSIS — J849 Interstitial pulmonary disease, unspecified: Secondary | ICD-10-CM | POA: Diagnosis not present

## 2023-03-30 LAB — PULMONARY FUNCTION TEST
DL/VA % pred: 115 %
DL/VA: 4.73 ml/min/mmHg/L
DLCO cor % pred: 76 %
DLCO cor: 15.01 ml/min/mmHg
DLCO unc % pred: 76 %
DLCO unc: 15.01 ml/min/mmHg
FEF 25-75 Post: 2.7 L/s
FEF 25-75 Pre: 2.09 L/s
FEF2575-%Change-Post: 29 %
FEF2575-%Pred-Post: 147 %
FEF2575-%Pred-Pre: 113 %
FEV1-%Change-Post: 6 %
FEV1-%Pred-Post: 87 %
FEV1-%Pred-Pre: 82 %
FEV1-Post: 1.98 L
FEV1-Pre: 1.86 L
FEV1FVC-%Change-Post: 3 %
FEV1FVC-%Pred-Pre: 111 %
FEV6-%Change-Post: 2 %
FEV6-%Pred-Post: 79 %
FEV6-%Pred-Pre: 77 %
FEV6-Post: 2.27 L
FEV6-Pre: 2.21 L
FEV6FVC-%Pred-Post: 104 %
FEV6FVC-%Pred-Pre: 104 %
FVC-%Change-Post: 2 %
FVC-%Pred-Post: 76 %
FVC-%Pred-Pre: 73 %
FVC-Post: 2.27 L
FVC-Pre: 2.21 L
Post FEV1/FVC ratio: 87 %
Post FEV6/FVC ratio: 100 %
Pre FEV1/FVC ratio: 84 %
Pre FEV6/FVC Ratio: 100 %
RV % pred: 36 %
RV: 0.82 L
TLC % pred: 60 %
TLC: 3.08 L

## 2023-03-30 NOTE — Progress Notes (Signed)
Full PFT Performed Today  

## 2023-03-30 NOTE — Patient Instructions (Signed)
Full PFT Performed Today  

## 2023-04-06 ENCOUNTER — Other Ambulatory Visit (HOSPITAL_COMMUNITY): Payer: Medicare Other

## 2023-04-18 ENCOUNTER — Other Ambulatory Visit: Payer: Self-pay | Admitting: Internal Medicine

## 2023-04-18 DIAGNOSIS — N959 Unspecified menopausal and perimenopausal disorder: Secondary | ICD-10-CM

## 2023-04-21 DIAGNOSIS — M25512 Pain in left shoulder: Secondary | ICD-10-CM | POA: Diagnosis not present

## 2023-04-21 DIAGNOSIS — E119 Type 2 diabetes mellitus without complications: Secondary | ICD-10-CM | POA: Diagnosis not present

## 2023-04-21 DIAGNOSIS — M549 Dorsalgia, unspecified: Secondary | ICD-10-CM | POA: Diagnosis not present

## 2023-04-21 DIAGNOSIS — I10 Essential (primary) hypertension: Secondary | ICD-10-CM | POA: Diagnosis not present

## 2023-04-21 DIAGNOSIS — G8929 Other chronic pain: Secondary | ICD-10-CM | POA: Diagnosis not present

## 2023-04-21 DIAGNOSIS — Z5320 Procedure and treatment not carried out because of patient's decision for unspecified reasons: Secondary | ICD-10-CM | POA: Diagnosis not present

## 2023-04-21 DIAGNOSIS — Z1231 Encounter for screening mammogram for malignant neoplasm of breast: Secondary | ICD-10-CM | POA: Diagnosis not present

## 2023-04-21 DIAGNOSIS — Z79899 Other long term (current) drug therapy: Secondary | ICD-10-CM | POA: Diagnosis not present

## 2023-04-25 DIAGNOSIS — Z79899 Other long term (current) drug therapy: Secondary | ICD-10-CM | POA: Diagnosis not present

## 2023-04-26 ENCOUNTER — Encounter: Payer: Self-pay | Admitting: Internal Medicine

## 2023-04-26 ENCOUNTER — Ambulatory Visit: Payer: Medicare Other | Admitting: Internal Medicine

## 2023-04-26 VITALS — BP 110/70 | HR 67 | Temp 98.1°F | Ht 65.0 in | Wt 199.8 lb

## 2023-04-26 DIAGNOSIS — J849 Interstitial pulmonary disease, unspecified: Secondary | ICD-10-CM

## 2023-04-26 DIAGNOSIS — Z0000 Encounter for general adult medical examination without abnormal findings: Secondary | ICD-10-CM

## 2023-04-26 DIAGNOSIS — E1129 Type 2 diabetes mellitus with other diabetic kidney complication: Secondary | ICD-10-CM

## 2023-04-26 DIAGNOSIS — E1142 Type 2 diabetes mellitus with diabetic polyneuropathy: Secondary | ICD-10-CM | POA: Diagnosis not present

## 2023-04-26 DIAGNOSIS — G4733 Obstructive sleep apnea (adult) (pediatric): Secondary | ICD-10-CM

## 2023-04-26 DIAGNOSIS — Z23 Encounter for immunization: Secondary | ICD-10-CM

## 2023-04-26 DIAGNOSIS — H9190 Unspecified hearing loss, unspecified ear: Secondary | ICD-10-CM | POA: Diagnosis not present

## 2023-04-26 DIAGNOSIS — R809 Proteinuria, unspecified: Secondary | ICD-10-CM | POA: Diagnosis not present

## 2023-04-26 NOTE — Addendum Note (Signed)
Addended by: Kern Reap B on: 04/26/2023 03:48 PM   Modules accepted: Orders

## 2023-04-26 NOTE — Progress Notes (Signed)
Established Patient Office Visit     CC/Reason for Visit: Annual preventive exam and subsequent Medicare wellness visit  HPI: Lindsay Brady is a 72 y.o. female who is coming in today for the above mentioned reasons. 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  She has been feeling well, has noticed decreased hearing and is requesting audiology referral.  Is due for flu, COVID, Tdap vaccinations.  Cancer screening is up-to-date.   Past Medical/Surgical History: Past Medical History:  Diagnosis Date   Anxiety    Arthritis    Cholecystitis 07/01/2014   COPD (chronic obstructive pulmonary disease) (HCC)    Diabetes mellitus without complication (HCC)    type 2    Fracture 2013 ?   left leg   GERD (gastroesophageal reflux disease)    hx of gerd   Headache    Hx: UTI (urinary tract infection)    Hyperlipidemia    OA (osteoarthritis) of knee    Pneumonia    hx of viral pna years ago   Shortness of breath dyspnea    Sleep apnea    uses cpap   Urgency of urination     Past Surgical History:  Procedure Laterality Date   ABDOMINAL HYSTERECTOMY     BIOPSY  10/07/2022   Procedure: BIOPSY;  Surgeon: Jenel Lucks, MD;  Location: [facility] ENDOSCOPY;  Service: Gastroenterology;;   BREAST BIOPSY Left 03/09/2022   CARPAL TUNNEL RELEASE     CHOLECYSTECTOMY N/A 07/19/2014   Procedure: LAPAROSCOPIC CHOLECYSTECTOMY WITH INTRAOPERATIVE CHOLANGIOGRAM;  Surgeon: Violeta Gelinas, MD;  Location: [facility] OR;  Service: General;  Laterality: N/A;   COLONOSCOPY  ?   2011   COLONOSCOPY WITH PROPOFOL N/A 10/07/2022   Procedure: COLONOSCOPY WITH PROPOFOL;  Surgeon: Jenel Lucks, MD;  Location: [facility] ENDOSCOPY;  Service: Gastroenterology;  Laterality: N/A;   HEMOSTASIS CLIP PLACEMENT  10/07/2022   Procedure: HEMOSTASIS CLIP PLACEMENT;  Surgeon: Jenel Lucks, MD;  Location: [facility] ENDOSCOPY;  Service: Gastroenterology;;   KNEE ARTHROSCOPY     POLYPECTOMY  10/07/2022   Procedure: POLYPECTOMY;  Surgeon: Jenel Lucks, MD;  Location: [facility] ENDOSCOPY;  Service: Gastroenterology;;   SUBMUCOSAL LIFTING INJECTION  10/07/2022   Procedure: SUBMUCOSAL LIFTING INJECTION;  Surgeon: Jenel Lucks, MD;  Location: [facility] ENDOSCOPY;  Service: Gastroenterology;;   SUBMUCOSAL TATTOO INJECTION  10/07/2022   Procedure: SUBMUCOSAL TATTOO INJECTION;  Surgeon: Jenel Lucks, MD;  Location: [facility] ENDOSCOPY;  Service: Gastroenterology;;   TONSILLECTOMY      Social History:  reports that she quit smoking about 26 years ago. Her smoking use included cigarettes. She started smoking about 56 years ago. She has a 60 pack-year smoking history. She has never used smokeless tobacco. She reports that she does not drink alcohol and does not use drugs.  Allergies: Allergies  Allergen Reactions   Macrodantin [Nitrofurantoin]     Scars lung   Bactrim [Sulfamethoxazole-Trimethoprim] Rash   Erythromycin Other (See Comments)    Pain in stomach    Fosamax  [Alendronate Sodium] Cough    After taking 1 pill, pt states that she had a cough.     Family History:  Family History  Problem Relation Age of Onset   Stroke Mother    Lung cancer Father        smoked   Colon cancer Father    Stomach cancer Paternal Grandmother  Cancer Paternal Grandmother    Stomach cancer Paternal Grandfather    Cancer Paternal Grandfather    Colon polyps Neg Hx    Esophageal cancer Neg Hx    Rectal cancer Neg Hx      Current Outpatient Medications:    acetaminophen (TYLENOL) 500 MG tablet, Take 500 mg by mouth every 6 (six) hours as needed for moderate pain or headache., Disp: , Rfl:    albuterol (VENTOLIN HFA) 108 (90 Base) MCG/ACT inhaler, Inhale 2 puffs into the lungs every 6 (six) hours as needed for wheezing or shortness of breath., Disp: 1 each, Rfl: 11   benzonatate (TESSALON)  200 MG capsule, Take 1 capsule (200 mg total) by mouth 3 (three) times daily as needed for cough., Disp: 30 capsule, Rfl: 1   chlorpheniramine (CHLOR-TRIMETON) 4 MG tablet, Take 4 mg by mouth at bedtime as needed for allergies., Disp: , Rfl:    escitalopram (LEXAPRO) 10 MG tablet, TAKE 1 TABLET BY MOUTH EVERY DAY, Disp: 90 tablet, Rfl: 0   famotidine (PEPCID) 20 MG tablet, Take one After breakfast and supper, Disp: , Rfl:    ibuprofen (ADVIL,MOTRIN) 800 MG tablet, Take 1 tablet (800 mg total) by mouth every 8 (eight) hours as needed., Disp: 90 tablet, Rfl: 0   Melatonin 10 MG TABS, Take 10 mg by mouth at bedtime as needed (sleep)., Disp: , Rfl:    mirabegron ER (MYRBETRIQ) 50 MG TB24 tablet, TAKE 1 TABLET BY MOUTH EVERY DAY, Disp: 90 tablet, Rfl: 0   oxyCODONE-acetaminophen (PERCOCET) 7.5-325 MG tablet, Take 1 tablet by mouth 3 (three) times daily as needed for moderate pain., Disp: , Rfl:    OXYGEN, 2lpm with sleep and 3lpm with exertion [facility], Disp: , Rfl:    Semaglutide (OZEMPIC, 0.25 OR 0.5 MG/DOSE, Jordan), Inject 0.25 mg as directed every Sunday., Disp: , Rfl:    simvastatin (ZOCOR) 20 MG tablet, TAKE 1 TABLET BY MOUTH EVERYDAY AT BEDTIME, Disp: 30 tablet, Rfl: 0   valsartan (DIOVAN) 40 MG tablet, TAKE 1 TABLET BY MOUTH EVERY DAY, Disp: 90 tablet, Rfl: 1   Vitamin D, Ergocalciferol, (DRISDOL) 1.25 MG (50000 UNIT) CAPS capsule, Take 50,000 Units by mouth every Wednesday., Disp: , Rfl:   Review of Systems:  Negative unless indicated in HPI.   Physical Exam: Vitals:   04/26/23 1507  BP: 110/70  Pulse: 67  Temp: 98.1 F (36.7 C)  TempSrc: Oral  SpO2: 98%  Weight: 199 lb 12.8 oz (90.6 kg)  Height: 5\' 5"  (1.651 m)    Body mass index is 33.25 kg/m.   Physical Exam Vitals reviewed.  Constitutional:      General: She is not in acute distress.    Appearance: Normal appearance. She is not ill-appearing, toxic-appearing or diaphoretic.  HENT:     Head: Normocephalic.     Right Ear:  Tympanic membrane, ear canal and external ear normal. There is no impacted cerumen.     Left Ear: Tympanic membrane, ear canal and external ear normal. There is no impacted cerumen.     Nose: Nose normal.     Mouth/Throat:     Mouth: Mucous membranes are moist.     Pharynx: Oropharynx is clear. No oropharyngeal exudate or posterior oropharyngeal erythema.  Eyes:     General: No scleral icterus.       Right eye: No discharge.        Left eye: No discharge.     Conjunctiva/sclera: Conjunctivae normal.  Pupils: Pupils are equal, round, and reactive to light.  Neck:     Vascular: No carotid bruit.  Cardiovascular:     Rate and Rhythm: Normal rate and regular rhythm.     Pulses: Normal pulses.     Heart sounds: Murmur heard.  Pulmonary:     Effort: Pulmonary effort is normal. No respiratory distress.     Breath sounds: Normal breath sounds.  Abdominal:     General: Abdomen is flat. Bowel sounds are normal.     Palpations: Abdomen is soft.  Musculoskeletal:        General: Normal range of motion.     Cervical back: Normal range of motion.  Skin:    General: Skin is warm and dry.  Neurological:     General: No focal deficit present.     Mental Status: She is alert and oriented to person, place, and time. Mental status is at baseline.  Psychiatric:        Mood and Affect: Mood normal.        Behavior: Behavior normal.        Thought Content: Thought content normal.        Judgment: Judgment normal.    Subsequent Medicare wellness visit   1. Risk factors, based on past  M,S,F - Cardiac Risk Factors include: advanced age (>46men, >17 women);diabetes mellitus;dyslipidemia   2.  Physical activities: Dietary issues and exercise activities discussed:      3.  Depression/mood:  Flowsheet Row Office Visit from 08/24/2021 in [facility] Total Score 4        4.  ADL's:    04/26/2023    3:05 PM 04/25/2023   11:32 PM  In your present  state of health, do you have any difficulty performing the following activities:  Hearing? 1 1  Vision? 0 0  Difficulty concentrating or making decisions? 0 0  Walking or climbing stairs? 1 1  Comment right knee pain and back pain, SOB   Dressing or bathing? 0 1  Doing errands, shopping? 0 0  Preparing Food and eating ? N N  Using the Toilet? N N  In the past six months, have you accidently leaked urine? Y Y  Do you have problems with loss of bowel control? Y Y  Managing your Medications? N N  Managing your Finances? N N  Housekeeping or managing your Housekeeping? N Y     5.  Fall risk:     09/26/2019    5:21 PM 08/13/2020    5:23 PM 08/24/2021    1:56 PM 04/25/2023   11:32 PM 04/26/2023    3:09 PM  Fall Risk  Falls in the past year?  0 0 1 1  Was there an injury with Fall?  0 0 0 0  Fall Risk Category Calculator  0 0 2 1  Fall Risk Category (Retired)  Low Low    (RETIRED) Patient Fall Risk Level Moderate fall risk  Low fall risk    Patient at Risk for Falls Due to   No Fall Risks    Fall risk Follow up   Falls evaluation completed  Falls evaluation completed     6.  Home safety: No problems identified   7.  Height weight, and visual acuity: height and weight as above, vision/hearing: Vision Screening   Right eye Left eye Both eyes  Without correction 20/25 20/25 20/25   With correction  8.  Counseling: Counseling given: Not Answered    9. Lab orders based on risk factors: Laboratory update will be reviewed   10. Cognitive assessment:        04/26/2023    3:10 PM  6CIT Screen  What Year? 0 points  What month? 0 points  What time? 0 points  Count back from 20 0 points  Months in reverse 0 points  Repeat phrase 0 points  Total Score 0 points     11. Screening: Patient provided with a written and personalized 5-10 year screening schedule in the AVS. Health Maintenance  Topic Date Due   Hemoglobin A1C  02/24/2022   Yearly kidney function blood test  for diabetes  08/25/2022   Yearly kidney health urinalysis for diabetes  08/25/2022   Zoster (Shingles) Vaccine (2 of 2) 09/07/2022   Flu Shot  12/30/2022   COVID-19 Vaccine (5 - 2023-24 season) 01/30/2023   Pneumonia Vaccine (3 of 3 - PPSV23 or PCV20) 06/23/2023   Eye exam for diabetics  03/16/2024   Complete foot exam   04/25/2024   Medicare Annual Wellness Visit  04/25/2024   Mammogram  03/17/2025   DTaP/Tdap/Td vaccine (4 - Td or Tdap) 07/13/2032   Colon Cancer Screening  10/06/2032   DEXA scan (bone density measurement)  Completed   Hepatitis C Screening  Completed   HPV Vaccine  Aged Out    12. Provider List Update: Patient Care Team    Relationship Specialty Notifications Start End  Philip Aspen, Limmie Patricia, MD PCP - General Internal Medicine  06/22/18      13. Advance Directives: Does Patient Have a Medical Advance Directive?: Yes Type of Advance Directive: Healthcare Power of Attorney, Living will, Out of facility DNR (pink MOST or yellow form) Does patient want to make changes to medical advance directive?: No - Patient declined Copy of Healthcare Power of Attorney in Chart?: No - copy requested  14. Opioids: Patient is not on any opioid prescriptions and has no risk factors for a substance use disorder.   15.   Goals   None      I have personally reviewed and noted the following in the patient's chart:   Medical and social history Use of alcohol, tobacco or illicit drugs  Current medications and supplements Functional ability and status Nutritional status Physical activity Advanced directives List of other physicians Hospitalizations, surgeries, and ER visits in previous 12 months Vitals Screenings to include cognitive, depression, and falls Referrals and appointments  In addition, I have reviewed and discussed with patient certain preventive protocols, quality metrics, and best practice recommendations. A written personalized care plan for preventive  services as well as general preventive health recommendations were provided to patient.   Impression and Plan:  Medicare annual wellness visit, subsequent  Type 2 diabetes mellitus with diabetic polyneuropathy, without long-term current use of insulin (HCC) -     Hemoglobin A1c; Future -     CBC with Differential/Platelet; Future -     Comprehensive metabolic panel; Future -     Lipid panel; Future -     Microalbumin / creatinine urine ratio; Future  Hearing loss, unspecified hearing loss type, unspecified laterality -     Ambulatory referral to Audiology  Microalbuminuria due to type 2 diabetes mellitus (HCC)  Morbid obesity due to excess calories (HCC) -     TSH; Future -     Vitamin B12; Future -     VITAMIN D 25 Hydroxy (Vit-D  Deficiency, Fractures); Future  OSA on CPAP  ILD (interstitial lung disease) (HCC)  c/w NSIP p macrodantin exp  Immunization due   -Recommend routine eye and dental care. -Healthy lifestyle discussed in detail. -Labs to be updated today. -Prostate cancer screening: N/A Health Maintenance  Topic Date Due   Hemoglobin A1C  02/24/2022   Yearly kidney function blood test for diabetes  08/25/2022   Yearly kidney health urinalysis for diabetes  08/25/2022   Zoster (Shingles) Vaccine (2 of 2) 09/07/2022   Flu Shot  12/30/2022   COVID-19 Vaccine (5 - 2023-24 season) 01/30/2023   Pneumonia Vaccine (3 of 3 - PPSV23 or PCV20) 06/23/2023   Eye exam for diabetics  03/16/2024   Complete foot exam   04/25/2024   Medicare Annual Wellness Visit  04/25/2024   Mammogram  03/17/2025   DTaP/Tdap/Td vaccine (4 - Td or Tdap) 07/13/2032   Colon Cancer Screening  10/06/2032   DEXA scan (bone density measurement)  Completed   Hepatitis C Screening  Completed   HPV Vaccine  Aged Out    -Flu vaccine in office today. -Audiology referral placed. -Advised to update Tdap at pharmacy.     Chaya Jan, MD  Primary Care at [facility]

## 2023-04-27 LAB — CBC WITH DIFFERENTIAL/PLATELET
Basophils Absolute: 0.1 10*3/uL (ref 0.0–0.1)
Basophils Relative: 1.3 % (ref 0.0–3.0)
Eosinophils Absolute: 0.1 10*3/uL (ref 0.0–0.7)
Eosinophils Relative: 1.3 % (ref 0.0–5.0)
HCT: 36.3 % (ref 36.0–46.0)
Hemoglobin: 12.2 g/dL (ref 12.0–15.0)
Lymphocytes Relative: 45.3 % (ref 12.0–46.0)
Lymphs Abs: 3.2 10*3/uL (ref 0.7–4.0)
MCHC: 33.5 g/dL (ref 30.0–36.0)
MCV: 97.7 fL (ref 78.0–100.0)
Monocytes Absolute: 0.6 10*3/uL (ref 0.1–1.0)
Monocytes Relative: 8.9 % (ref 3.0–12.0)
Neutro Abs: 3.1 10*3/uL (ref 1.4–7.7)
Neutrophils Relative %: 43.2 % (ref 43.0–77.0)
Platelets: 220 10*3/uL (ref 150.0–400.0)
RBC: 3.72 Mil/uL — ABNORMAL LOW (ref 3.87–5.11)
RDW: 13.6 % (ref 11.5–15.5)
WBC: 7.1 10*3/uL (ref 4.0–10.5)

## 2023-04-27 LAB — COMPREHENSIVE METABOLIC PANEL
ALT: 10 U/L (ref 0–35)
AST: 18 U/L (ref 0–37)
Albumin: 4 g/dL (ref 3.5–5.2)
Alkaline Phosphatase: 56 U/L (ref 39–117)
BUN: 15 mg/dL (ref 6–23)
CO2: 28 meq/L (ref 19–32)
Calcium: 9.4 mg/dL (ref 8.4–10.5)
Chloride: 105 meq/L (ref 96–112)
Creatinine, Ser: 0.7 mg/dL (ref 0.40–1.20)
GFR: 86.56 mL/min (ref >60.00)
Glucose, Bld: 92 mg/dL (ref 70–99)
Potassium: 4.1 meq/L (ref 3.5–5.1)
Sodium: 139 meq/L (ref 135–145)
Total Bilirubin: 0.4 mg/dL (ref 0.2–1.2)
Total Protein: 6.7 g/dL (ref 6.0–8.3)

## 2023-04-27 LAB — LIPID PANEL
Cholesterol: 138 mg/dL (ref 0–200)
HDL: 50.4 mg/dL (ref >39.00)
LDL Cholesterol: 70 mg/dL (ref 0–99)
NonHDL: 87.85
Total CHOL/HDL Ratio: 3
Triglycerides: 91 mg/dL (ref 0.0–149.0)
VLDL: 18.2 mg/dL (ref 0.0–40.0)

## 2023-04-27 LAB — MICROALBUMIN / CREATININE URINE RATIO
Creatinine,U: 152.9 mg/dL
Microalb Creat Ratio: 0.9 mg/g (ref 0.0–30.0)
Microalb, Ur: 1.4 mg/dL (ref 0.0–1.9)

## 2023-04-27 LAB — TSH: TSH: 0.81 u[IU]/mL (ref 0.35–5.50)

## 2023-04-27 LAB — VITAMIN D 25 HYDROXY (VIT D DEFICIENCY, FRACTURES): VITD: 65.12 ng/mL (ref 30.00–100.00)

## 2023-04-27 LAB — VITAMIN B12: Vitamin B-12: 246 pg/mL (ref 211–911)

## 2023-04-27 LAB — HEMOGLOBIN A1C: Hgb A1c MFr Bld: 5.7 % (ref 4.6–6.5)

## 2023-05-02 ENCOUNTER — Other Ambulatory Visit (HOSPITAL_COMMUNITY)
Admission: RE | Admit: 2023-05-02 | Discharge: 2023-05-02 | Disposition: A | Discharge status: Home / Self Care | Payer: Self-pay | Source: Ambulatory Visit | Attending: Oncology | Admitting: Oncology

## 2023-05-02 DIAGNOSIS — Z006 Encounter for examination for normal comparison and control in clinical research program: Secondary | ICD-10-CM | POA: Insufficient documentation

## 2023-05-13 LAB — GENECONNECT MOLECULAR SCREEN: Genetic Analysis Overall Interpretation: NEGATIVE

## 2023-05-20 DIAGNOSIS — N342 Other urethritis: Secondary | ICD-10-CM | POA: Diagnosis not present

## 2023-06-02 DIAGNOSIS — Z5320 Procedure and treatment not carried out because of patient's decision for unspecified reasons: Secondary | ICD-10-CM | POA: Diagnosis not present

## 2023-06-02 DIAGNOSIS — G8929 Other chronic pain: Secondary | ICD-10-CM | POA: Diagnosis not present

## 2023-06-02 DIAGNOSIS — M549 Dorsalgia, unspecified: Secondary | ICD-10-CM | POA: Diagnosis not present

## 2023-06-02 DIAGNOSIS — I10 Essential (primary) hypertension: Secondary | ICD-10-CM | POA: Diagnosis not present

## 2023-06-02 DIAGNOSIS — E119 Type 2 diabetes mellitus without complications: Secondary | ICD-10-CM | POA: Diagnosis not present

## 2023-06-02 DIAGNOSIS — Z1231 Encounter for screening mammogram for malignant neoplasm of breast: Secondary | ICD-10-CM | POA: Diagnosis not present

## 2023-06-02 DIAGNOSIS — M25512 Pain in left shoulder: Secondary | ICD-10-CM | POA: Diagnosis not present

## 2023-06-02 DIAGNOSIS — Z79899 Other long term (current) drug therapy: Secondary | ICD-10-CM | POA: Diagnosis not present

## 2023-06-06 DIAGNOSIS — Z79899 Other long term (current) drug therapy: Secondary | ICD-10-CM | POA: Diagnosis not present

## 2023-06-30 ENCOUNTER — Other Ambulatory Visit: Payer: Self-pay

## 2023-06-30 DIAGNOSIS — Z79899 Other long term (current) drug therapy: Secondary | ICD-10-CM | POA: Diagnosis not present

## 2023-06-30 DIAGNOSIS — E119 Type 2 diabetes mellitus without complications: Secondary | ICD-10-CM | POA: Diagnosis not present

## 2023-06-30 DIAGNOSIS — Z5320 Procedure and treatment not carried out because of patient's decision for unspecified reasons: Secondary | ICD-10-CM | POA: Diagnosis not present

## 2023-06-30 DIAGNOSIS — M25512 Pain in left shoulder: Secondary | ICD-10-CM | POA: Diagnosis not present

## 2023-06-30 DIAGNOSIS — Z1231 Encounter for screening mammogram for malignant neoplasm of breast: Secondary | ICD-10-CM | POA: Diagnosis not present

## 2023-06-30 DIAGNOSIS — Z1211 Encounter for screening for malignant neoplasm of colon: Secondary | ICD-10-CM

## 2023-06-30 DIAGNOSIS — I10 Essential (primary) hypertension: Secondary | ICD-10-CM | POA: Diagnosis not present

## 2023-06-30 DIAGNOSIS — G8929 Other chronic pain: Secondary | ICD-10-CM | POA: Diagnosis not present

## 2023-06-30 DIAGNOSIS — Z860100 Personal history of colon polyps, unspecified: Secondary | ICD-10-CM

## 2023-06-30 DIAGNOSIS — M549 Dorsalgia, unspecified: Secondary | ICD-10-CM | POA: Diagnosis not present

## 2023-07-02 ENCOUNTER — Other Ambulatory Visit: Payer: Self-pay | Admitting: Internal Medicine

## 2023-07-02 DIAGNOSIS — F419 Anxiety disorder, unspecified: Secondary | ICD-10-CM

## 2023-07-11 ENCOUNTER — Other Ambulatory Visit: Payer: Self-pay | Admitting: Internal Medicine

## 2023-07-11 DIAGNOSIS — N959 Unspecified menopausal and perimenopausal disorder: Secondary | ICD-10-CM

## 2023-07-16 ENCOUNTER — Other Ambulatory Visit: Payer: Self-pay | Admitting: Internal Medicine

## 2023-07-18 ENCOUNTER — Ambulatory Visit: Payer: Medicare Other | Attending: Internal Medicine | Admitting: Audiologist

## 2023-07-18 DIAGNOSIS — H903 Sensorineural hearing loss, bilateral: Secondary | ICD-10-CM | POA: Diagnosis not present

## 2023-07-19 NOTE — Procedures (Signed)
  Outpatient Audiology and [facility] [address] [place], [place]  [address] [phone]  AUDIOLOGICAL  EVALUATION  NAME: Lindsay Brady     DOB:   09/03/1950      MRN: [id number]                                                                                     DATE: 07/19/2023     REFERENT: Philip Aspen, Limmie Patricia, MD STATUS: Outpatient DIAGNOSIS: Sensorineural Hearing Loss   History: Lindsay Brady was seen for an audiological evaluation due to feeling like people are mumbling. Lindsay Brady's daughters are concerned. They have asked Lindsay Brady to get a hearing test. Lindsay Brady feels she hears well but cannot understand people. She has to ask people to repeat because their speech is not clear. Lindsay Brady denies pain or pressure today. She has tinnitus in both ears and feeling it also interferes with her ability to understand. 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 Lindsay Brady no significant history of hazardous noise exposure.  Medical history shows no additional risk for hearing loss.    Evaluation:  Otoscopy showed a clear view of the tympanic membranes, bilaterally Tympanometry results were consistent with normal middle ear function, bilaterally   Audiometric testing was completed using Conventional Audiometry techniques with insert earphones and supraural headphones. Test results are consistent with sloping normal to moderately severe sensorineural hearing loss bilaterally. Speech Recognition Thresholds were obtained at 35 dB HL in the right ear and at 25 dB HL in the left ear. Word Recognition Testing was completed at  40dB SL and Lindsay Brady scored 100% in each ear.   Results:  The test results were reviewed with Lindsay Brady. Lindsay Brady has a moderately severe high frequency hearing loss. Other people are not mumbling, she just cannot hear high pitch speech sounds. She needs hearing aids. Audiogram printed and provided to [place].    Recommendations: Hearing aids recommended for both  ears. Patient given list of local hearing aid providers. She was also given a handout about [employer] Annual audiometric testing recommended to monitor hearing loss for progression.    32 minutes spent testing and counseling on results.   If you have any questions please feel free to contact me at [phone].  Ammie Ferrier Au.D.  Audiologist   07/19/2023  10:32 AM  Cc: Philip Aspen, Limmie Patricia, MD

## 2023-07-20 ENCOUNTER — Ambulatory Visit (AMBULATORY_SURGERY_CENTER): Payer: Medicare Other

## 2023-07-20 VITALS — Ht 65.0 in | Wt 190.0 lb

## 2023-07-20 DIAGNOSIS — Z860100 Personal history of colon polyps, unspecified: Secondary | ICD-10-CM

## 2023-07-20 MED ORDER — NA SULFATE-K SULFATE-MG SULF 17.5-3.13-1.6 GM/177ML PO SOLN: 1.0000 | Freq: Once | ORAL | 0 refills | Status: AC

## 2023-07-20 NOTE — Progress Notes (Signed)

## 2023-07-25 ENCOUNTER — Telehealth: Payer: Self-pay | Admitting: Gastroenterology

## 2023-07-25 DIAGNOSIS — Z1211 Encounter for screening for malignant neoplasm of colon: Secondary | ICD-10-CM

## 2023-07-25 NOTE — Telephone Encounter (Signed)
 Procedure:*** Procedure date: *** Procedure location: *** Arrival Time: *** Spoke with the patient Y/N: Y Any prep concerns? ***  Has the patient obtained the prep from the pharmacy ? *** Do you have a care partner and transportation: *** Any additional concerns? ***     She stated that her insurance would not cover the procedure. The insurance need the cpt code to verify.

## 2023-07-25 NOTE — Telephone Encounter (Signed)
 Patient called and stated that she needs to reschedule appointment in order for her insurance to cover her procedure. Patient is requesting a call back. Please advise.

## 2023-07-26 NOTE — Telephone Encounter (Signed)
 Left message for pt to call back

## 2023-07-26 NOTE — Telephone Encounter (Signed)
 Pt states she needs to reschedule her hosp colon as it has not been a year since her last one and her insurance will not cover it.   Pts colon rescheduled at [facility] 10/27/23 at 8:30am, [id number]. Pt aware of new appt.

## 2023-07-31 DIAGNOSIS — M25512 Pain in left shoulder: Secondary | ICD-10-CM | POA: Diagnosis not present

## 2023-07-31 DIAGNOSIS — Z1231 Encounter for screening mammogram for malignant neoplasm of breast: Secondary | ICD-10-CM | POA: Diagnosis not present

## 2023-07-31 DIAGNOSIS — M549 Dorsalgia, unspecified: Secondary | ICD-10-CM | POA: Diagnosis not present

## 2023-07-31 DIAGNOSIS — E119 Type 2 diabetes mellitus without complications: Secondary | ICD-10-CM | POA: Diagnosis not present

## 2023-07-31 DIAGNOSIS — I10 Essential (primary) hypertension: Secondary | ICD-10-CM | POA: Diagnosis not present

## 2023-07-31 DIAGNOSIS — G8929 Other chronic pain: Secondary | ICD-10-CM | POA: Diagnosis not present

## 2023-07-31 DIAGNOSIS — Z79899 Other long term (current) drug therapy: Secondary | ICD-10-CM | POA: Diagnosis not present

## 2023-09-06 DIAGNOSIS — M549 Dorsalgia, unspecified: Secondary | ICD-10-CM | POA: Diagnosis not present

## 2023-09-06 DIAGNOSIS — G8929 Other chronic pain: Secondary | ICD-10-CM | POA: Diagnosis not present

## 2023-09-06 DIAGNOSIS — E119 Type 2 diabetes mellitus without complications: Secondary | ICD-10-CM | POA: Diagnosis not present

## 2023-09-06 DIAGNOSIS — M25512 Pain in left shoulder: Secondary | ICD-10-CM | POA: Diagnosis not present

## 2023-09-06 DIAGNOSIS — I10 Essential (primary) hypertension: Secondary | ICD-10-CM | POA: Diagnosis not present

## 2023-09-06 DIAGNOSIS — Z1231 Encounter for screening mammogram for malignant neoplasm of breast: Secondary | ICD-10-CM | POA: Diagnosis not present

## 2023-09-06 DIAGNOSIS — Z79899 Other long term (current) drug therapy: Secondary | ICD-10-CM | POA: Diagnosis not present

## 2023-09-08 DIAGNOSIS — Z79899 Other long term (current) drug therapy: Secondary | ICD-10-CM | POA: Diagnosis not present

## 2023-10-04 DIAGNOSIS — G8929 Other chronic pain: Secondary | ICD-10-CM | POA: Diagnosis not present

## 2023-10-04 DIAGNOSIS — Z79899 Other long term (current) drug therapy: Secondary | ICD-10-CM | POA: Diagnosis not present

## 2023-10-04 DIAGNOSIS — I10 Essential (primary) hypertension: Secondary | ICD-10-CM | POA: Diagnosis not present

## 2023-10-04 DIAGNOSIS — M25512 Pain in left shoulder: Secondary | ICD-10-CM | POA: Diagnosis not present

## 2023-10-04 DIAGNOSIS — M549 Dorsalgia, unspecified: Secondary | ICD-10-CM | POA: Diagnosis not present

## 2023-10-04 DIAGNOSIS — E119 Type 2 diabetes mellitus without complications: Secondary | ICD-10-CM | POA: Diagnosis not present

## 2023-10-04 DIAGNOSIS — Z1231 Encounter for screening mammogram for malignant neoplasm of breast: Secondary | ICD-10-CM | POA: Diagnosis not present

## 2023-10-07 DIAGNOSIS — Z79899 Other long term (current) drug therapy: Secondary | ICD-10-CM | POA: Diagnosis not present

## 2023-10-13 ENCOUNTER — Encounter: Payer: Self-pay | Admitting: Gastroenterology

## 2023-10-19 ENCOUNTER — Telehealth: Payer: Self-pay | Admitting: Gastroenterology

## 2023-10-19 NOTE — Telephone Encounter (Signed)
 Procedure:Colonoscopy Procedure date: 10/27/23 Procedure location: [facility] Arrival Time: 7:00 am Spoke with the patient Y/N: Yes Any prep concerns? No  Has the patient obtained the prep from the pharmacy ? Yes Do you have a care partner and transportation: Yes Any additional concerns? No

## 2023-10-20 ENCOUNTER — Telehealth: Payer: Self-pay | Admitting: *Deleted

## 2023-10-20 ENCOUNTER — Telehealth: Payer: Self-pay | Admitting: Gastroenterology

## 2023-10-20 ENCOUNTER — Encounter (HOSPITAL_COMMUNITY): Payer: Self-pay | Admitting: Gastroenterology

## 2023-10-20 NOTE — Telephone Encounter (Signed)
 Spoke with patient to schedule pre op clearance appointment--patient is scheduled for Tuesday 10/25/23 at 1:00 pm with Dr. Waymond Hailey in the [place] location---patient is aware of appointment date time and location

## 2023-10-20 NOTE — Telephone Encounter (Signed)
 Patient is scheduled for 10/27/23 colonoscopy in the hospital setting with Dr Cherryl Corona. Please see note from pre-op team below.   Is patient okay to move forward with colonoscopy procedure from a pulmonary standpoint?  Thank you for any help you can provide!

## 2023-10-20 NOTE — Progress Notes (Signed)
 Lindsay Brady   PCP-[facility] Pulmonologist-Cobb NP  EKG-03/04/16 [id number] Cath-n/a Stress-n/a ICD/PM-n/a Blood thinner-n/a GLP-1- Ozempic last dose 5/16 hold 1 week   HX: Murmur, COPD, Emphysema, DM, OSA. Patient last saw her pulmonologist 01/07/23 where they recommended PFTS which were done in 03/2023. Does wear 2L02 but hasn't been using much lately, says equipment is not working properly. I asked if shes SOB, she said only when she does a lot of activity, knows her limits, no other equipment use. Last saw her PCP 04/2023. Anesthesia Review- Yes- Needs pulm clearance, if not able needs PCP clearance, office notified 5/22.

## 2023-10-20 NOTE — Telephone Encounter (Signed)
 Needs ov 1st

## 2023-10-20 NOTE — Telephone Encounter (Signed)
 Received a message from Doren Gammons, RN at the hospital   Hi there, called this pt for preop and she mentioned she uses 02 regularly but hasn't been using lately since she's having equipment issues. She hasn't seen her pulm since nov last year. Anesthesia wants her to get pulm clearance for procedure. If that's not possible a PCP clearance. I was going to message this to Stana Ear but looks like she's out, can you tag in Dr Danetta Dunnings nurse if needed?

## 2023-10-20 NOTE — Telephone Encounter (Signed)
 LVM for patient to call and schedule pre op clearance appointment with Dr. Waymond Hailey

## 2023-10-20 NOTE — Telephone Encounter (Signed)
 See additional phone note sent to pulmonology 10/20/23.

## 2023-10-21 DIAGNOSIS — E0590 Thyrotoxicosis, unspecified without thyrotoxic crisis or storm: Secondary | ICD-10-CM | POA: Insufficient documentation

## 2023-10-21 DIAGNOSIS — E785 Hyperlipidemia, unspecified: Secondary | ICD-10-CM | POA: Insufficient documentation

## 2023-10-21 DIAGNOSIS — M722 Plantar fascial fibromatosis: Secondary | ICD-10-CM | POA: Insufficient documentation

## 2023-10-21 DIAGNOSIS — I6523 Occlusion and stenosis of bilateral carotid arteries: Secondary | ICD-10-CM | POA: Insufficient documentation

## 2023-10-21 DIAGNOSIS — I878 Other specified disorders of veins: Secondary | ICD-10-CM | POA: Insufficient documentation

## 2023-10-21 DIAGNOSIS — K219 Gastro-esophageal reflux disease without esophagitis: Secondary | ICD-10-CM | POA: Insufficient documentation

## 2023-10-21 DIAGNOSIS — M47812 Spondylosis without myelopathy or radiculopathy, cervical region: Secondary | ICD-10-CM | POA: Insufficient documentation

## 2023-10-21 NOTE — Telephone Encounter (Signed)
 Patient has been scheduled for an appointment with pulmonology 10/25/23.

## 2023-10-23 NOTE — Progress Notes (Unsigned)
 Subjective:   Patient ID: Lindsay Brady, female    DOB: 08-01-1950    MRN: [id number]    Brief patient profile:  72  yowf quit smoking 1998 at wt around 200 with some am cough and did fine s rx but in 2011 noted doe across parking lot where worked and dx as copd by [employer] and self referred to pulmonary clinic 12/05/2015 as shifting all her care to [place] with new dx of ILD 02/10/2016 p macrodantin exposure and no evidence of airflow ostruction on symb 160 2bid with poor baseline hfa technique.     History of Present Illness  12/05/2015 1st [facility] office visit/ Khyron Garno   Chief Complaint  Patient presents with   Pulmonary Consult    Self referral. Pt states dxed with Emphysema in 2011. She has been seen at [facility] in the past. She c/o hoarseness and increased cough for the past yr.  Cough is prod with white to tan sputum.  She also c/o SOB with walking long distances and up stairs. Hot weather tends to make her breathing worse. She uses ventolin  2 x per day on average.   indolent onset doe x 6 y better on tudorza and spiriva (mouth dry) and no change on symbicort  Needs saba at least twice daily  Already did rehab with wt = ? But did well  Doe = MMRC3 = can't walk 100 yards even at a slow pace at a flat grade s stopping due to sob   Cough p stopped smoking got worse then better and resolved until one year prior to OV  And present daily since but never while on cpap / neg w/u reflux by GI but ent disagreed  rec Plan A = Automatic = symbicort 160 Take 2 puffs first thing in am and then another 2 puffs about 12 hours later / tudorza one twice daily  Plan B = Backup Only use your albuterol  as a rescue medication GERD diet  For drainage / throat tickle try take CHLORPHENIRAMINE  4 mg - take one every 4 hours as needed        08/05/2021  f/u ov/Donnis Pecha re: 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/ no flare off prednisone    Chief Complaint  Patient presents with   Follow-up    Sleeps elevated but  still feels like "choking" during the night, PND   Dyspnea:  [employer] parking / pushes cart around [employer] size store ok not checking sats  Cough: none  Sleeping: electric bed 45 degrees ok on cpap does not know who rx SABA use: using only p ex once a week  02: not using it  Covid status:   vax x 4  Rec We will be referring you to one of our sleep doctors here Make sure you check your oxygen  saturation  AT  your highest level of activity (not after you stop)   to be sure it stays over 90%  We will walk again to today to see if you qualify for 0xygen > desat p 250 ft so rec titrate to keep > 90%   Echo 11/03/21 Grade 1  diastolic dysfunction  but poor viz AV with peak gradient 17.4    02/02/2022  f/u ov/Jamey Harman re: 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   Dyspnea:  grocery shopping s [phone] / uses [employer]c parking / has stationery bike plans to start using but poor insight into how/ when to use 02  Cough: dry daytime cough on acei better only tessilon  Sleeping: 45 degrees, cough fine p falls asleep/ on cpap but needs requalify (years ago WS started and does not want to return there)  SABA use: rarely  02: 2lpm activity  Rec My office will be contacting you by phone for referral to CPAP titration trial   Make sure you check your oxygen  saturation  AT  your highest level of activity (not after you stop)   to be sure it stays over 90%  Keep working on weight loss  Stop lisinopril  and start valsartan  40 mg daily  For cough ok to take tessalon  (benzoate) 100mg  three times daily as needed but the cough should be gone in a few weeks Continue  famotidine  20 mg after bfast and supper   03/07/22 cpap titration: Sood  - Trial of CPAP therapy on 7 cm H2O with a Medium size Resmed Nasal Airfit N20 mask and heated  humidification.   03/16/2022  f/u ov/Margia Wiesen re: PF   maint on prn saba   Chief Complaint  Patient presents with   Follow-up    Follow-up for sleep study    Dyspnea:  very sedentary, no bike riding  Cough: better  Sleeping: 45 degrees  on "broken cpap"  SABA use: every 2 weeks - never pre treats or rechallenges 02: has prn, not using  Covid status:   4 x vax Rec My office will be contacting you by phone for referral to for   new cpap machine and mask per Dr Carlyle Childes instructions  We will set you up follow up with Dr Matilde Son for sleep medicine > see by Canary Ceo NP insteead  Make sure you check your oxygen  saturation  AT  your highest level of activity (not after you stop)   to be sure it stays over 90%   Pulmonary medicine follow up is as needed  - PFT's  03/30/23  FEV1 1.98 (87 % ) ratio 0.87  p 6 % improvement from saba p 0 prior to study with DLCO  15.01 (76%)   and FV curve nl / FVC improved from priors    10/25/2023  re-establish  ov/[place] office/Avyana Puffenbarger re: PF from marcrodantin  maint on prn saba/ no longer using 02 Chief Complaint  Patient presents with   Cough   Shortness of Breath  Dyspnea:  bending over and going up steps x one floor and stops half way  Cough: rarely  Sleeping: 45 degress HOB / s    resp cc on CPAP / no 02  SABA use: once a month - still not really clear if helps doe or cough  02: not using 02 at al   No obvious day to day or daytime variability or assoc excess/ purulent sputum or mucus plugs or hemoptysis or cp or chest tightness, subjective wheeze or overt   hb symptoms.    Also denies any obvious fluctuation of symptoms with weather or environmental changes or other aggravating or alleviating factors except as outlined above   No unusual exposure hx or h/o childhood pna/ asthma or knowledge of premature birth.  Current Allergies, Complete Past Medical History, Past Surgical History, Family History, and Social History were reviewed in [employer] record.  ROS  The following  are not active complaints unless bolded Hoarseness, sore throat, dysphagia, dental problems, itching, sneezing,  nasal congestion or discharge of excess mucus or purulent secretions, ear ache,   fever, chills, sweats, unintended wt loss or wt gain, classically pleuritic or exertional cp,  orthopnea pnd or arm/hand swelling  or leg swelling, presyncope, palpitations, abdominal pain, anorexia, nausea, vomiting, diarrhea  or change in bowel habits or change in bladder habits, change in stools or change in urine, dysuria, hematuria,  rash, arthralgias, visual complaints, headache, numbness, weakness or ataxia or problems with walking or coordination,  change in mood or  memory.        Current Meds  Medication Sig   acetaminophen  (TYLENOL ) 500 MG tablet Take 500 mg by mouth every 6 (six) hours as needed for moderate pain or headache.   albuterol  (VENTOLIN  HFA) 108 (90 Base) MCG/ACT inhaler Inhale 2 puffs into the lungs every 6 (six) hours as needed for wheezing or shortness of breath.   chlorpheniramine (CHLOR-TRIMETON) 4 MG tablet Take 4 mg by mouth at bedtime as needed for allergies.   escitalopram  (LEXAPRO ) 10 MG tablet TAKE 1 TABLET BY MOUTH EVERY DAY   famotidine  (PEPCID ) 20 MG tablet Take one After breakfast and supper   ibuprofen  (ADVIL ,MOTRIN ) 800 MG tablet Take 1 tablet (800 mg total) by mouth every 8 (eight) hours as needed.   Melatonin 10 MG TABS Take 10 mg by mouth at bedtime as needed (sleep).   Multiple Vitamins-Minerals (MULTIVITAL PO) Take by mouth.   MYRBETRIQ  50 MG TB24 tablet TAKE 1 TABLET BY MOUTH EVERY DAY   oxyCODONE -acetaminophen  (PERCOCET) 7.5-325 MG tablet Take 1 tablet by mouth 3 (three) times daily as needed for moderate pain.   OXYGEN  2lpm with sleep and 3lpm with exertion [facility]   OZEMPIC, 2 MG/DOSE, 8 MG/3ML SOPN Inject 2 mg into the skin once a week.   simvastatin  (ZOCOR ) 20 MG tablet TAKE 1 TABLET BY MOUTH EVERYDAY AT BEDTIME    valsartan  (DIOVAN ) 40 MG tablet TAKE 1 TABLET BY MOUTH EVERY DAY   Vitamin D , Ergocalciferol , (DRISDOL) 1.25 MG (50000 UNIT) CAPS capsule Take 50,000 Units by mouth every Wednesday.                     Objective:  Physical Exam  Wts  10/25/2023       187   03/16/2022     233  02/02/2022        235  08/05/2021        243  01/20/2021       231   04/11/2018     283  01/09/2018      275  07/12/2017      262  01/10/2017       259   10/08/2016       286  09/08/2016       294   07/27/2016      298  05/04/2016        290 03/23/2016     278   02/10/16 276 lb (125.2 kg)  01/07/16 274 lb (124.3 kg)  12/05/15 279 lb 3.2 oz (126.6 kg)   Vital signs reviewed  10/25/2023  - Note at rest 02 sats  93% on RA   General appearance:    amb wf nad   HEENT : Oropharynx clear        Nasal turbinates nl     NECK :  without  apparent JVD/ palpable Nodes/TM  LUNGS: no acc muscle use,  Nl contour chest which is clear to A and P bilaterally without cough on insp or exp maneuvers   CV:  RRR  no s3   2/6 SEM s increase in P2, and trace L > R  LE pitting edema   ABD:  soft and nontender   MS:  Gait nl       SKIN: warm and dry without lesions    NEURO:  alert, approp, nl sensorium with  no motor or cerebellar deficits apparent.       Assessment & Plan:

## 2023-10-25 ENCOUNTER — Encounter: Payer: Self-pay | Admitting: Internal Medicine

## 2023-10-25 ENCOUNTER — Ambulatory Visit: Admitting: Internal Medicine

## 2023-10-25 ENCOUNTER — Telehealth: Payer: Self-pay | Admitting: Internal Medicine

## 2023-10-25 VITALS — BP 133/73 | HR 76 | Ht 65.0 in | Wt 187.0 lb

## 2023-10-25 DIAGNOSIS — J849 Interstitial pulmonary disease, unspecified: Secondary | ICD-10-CM

## 2023-10-25 DIAGNOSIS — R059 Cough, unspecified: Secondary | ICD-10-CM | POA: Diagnosis not present

## 2023-10-25 DIAGNOSIS — Z87891 Personal history of nicotine dependence: Secondary | ICD-10-CM

## 2023-10-25 DIAGNOSIS — R0602 Shortness of breath: Secondary | ICD-10-CM

## 2023-10-25 DIAGNOSIS — J9611 Chronic respiratory failure with hypoxia: Secondary | ICD-10-CM

## 2023-10-25 NOTE — Patient Instructions (Addendum)
 To get the most out of exercise, you need to be continuously aware that you are short of breath, but never out of breath, for at least 30 minutes daily. As you improve, it will actually be easier for you to do the same amount of exercise  in  30 minutes so always push to the level where you are short of breath.  Once you can do this, push for longer duration or repeat it after at least 4 hours of rest.  We will stop your 02 today as requested   Pulmonary follow up is as needed

## 2023-10-25 NOTE — Telephone Encounter (Signed)
 Cmn received from Lindsay Brady for O2 concentrator and home fill system.

## 2023-10-25 NOTE — Assessment & Plan Note (Signed)
 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 -  12/12/14  FVC = 2.11 - 06/19/15   FVC = 2.16  -  PFTs 05/18/16  FVC 2.07 (63%) no obst and dlco 52 corrects to 98 on 10 mg daily pred with esr 26 -  ESR 07/27/2016  =   31  @ pred 5 mg daily > wean off pred by mid March 2018 and no change in ESR 09/08/2016  - .HSP profile 09/08/2016 >  Neg -  Spirometry 09/08/2016  FVC  1.89 off pred  - 07/12/2017   Walked RA  2 laps @ 185 ft each stopped due to  Sob /desat to 80%  - ESR 07/12/2017 = 34 (no change ) - 08/05/2021   Walked on RA   x  one  lap(s) =  approx 250  ft  @ nl pace, stopped due to sob with sats 87%  Then placed on 2lpm and completed 574ft  with lowest 02 sats 97%  - 02/02/2022 still dsats to 88% on RA p 750 ft > see resp failure  - 03/16/2022   Walked on RA  x  3  lap(s) =  approx 750  ft  @ fast pace, stopped due to end of study with lowest 02 sats 89%  s sob but limited by knee pain   - PFT's  03/30/23  FEV1 1.98 (87 % ) ratio 0.87  p 6 % improvement from saba p 0 prior to study with DLCO  15.01 (76%)   and FV curve nl / FVC improved from priors  - 10/25/2023 not ex desats > d/c 02 and f/u prn

## 2023-10-25 NOTE — Assessment & Plan Note (Signed)
 SATURATION QUALIFICATIONS:  02/10/2016  Patient Saturations on Room Air at Rest = 90% Patient Saturations on Room Air while Ambulating = 83% Patient Saturations on 3 Liters of oxygen  while Ambulating = 90% - 07/27/2016   Walked RA  2 laps @ 185 ft each stopped due to  desat to 88% RA - 10/08/2016   Walked RA  2 laps @ 185 ft each stopped due to sob/ sats 88% fast pace   - 07/12/2017   Walked RA  2 laps @ 185 ft each stopped due to  Sob /desat to 80%  - 01/20/2021 walked RX slow pace x 250 ft with sats 94% and no sob - 08/05/2021   Walked on RA*  x  one  lap(s) =  approx 250  ft  @ nl pace, stopped due to sob with sats 87%  Then placed on 2lpm and completed 591ft  with lowest 02 sats 97%  - 02/02/2022   Walked on RA  x  3  lap(s) =  approx 750  ft  @ mod fast pace, stopped due to sob with lowest 02 sats 88% then on 2lpm POC maint 98% thru another 250 ft - 10/25/2023   Walked on RA  x  3  lap(s) =  approx 450  ft  @ mod pace, stopped due to end of study s sob  with lowest 02 sats 93% - 10/25/2023 rec d/c all 02 rx  Despite smoking hx with very mil upper lobe emphysema on HRCT and likley macrodantin pulmonary toxicity,  she has improved to point where she no longer needs 02 or SABA so pulmonary f/u is prn   I did advised her to continue efforts to get her wt back to baseline and work on conditioning with regular sub max ex and f/u in pulmonary clinic prn   Each maintenance medication was reviewed in detail including emphasizing most importantly the difference between maintenance and prns and under what circumstances the prns are to be triggered using an action plan format where appropriate.  Total time for H and P, chart review, counseling, reviewing hfa device(s) , directly observing portions of ambulatory 02 saturation study/ and generating customized AVS unique to this office visit / same day charting = 35 min final summary f/u ov

## 2023-10-27 ENCOUNTER — Ambulatory Visit (HOSPITAL_COMMUNITY): Admitting: Certified Registered Nurse Anesthetist

## 2023-10-27 ENCOUNTER — Ambulatory Visit (HOSPITAL_COMMUNITY)
Admission: RE | Admit: 2023-10-27 | Discharge: 2023-10-27 | Disposition: A | Discharge status: Home / Self Care | Payer: Medicare Other | Attending: Gastroenterology | Admitting: Gastroenterology

## 2023-10-27 ENCOUNTER — Other Ambulatory Visit: Payer: Self-pay

## 2023-10-27 ENCOUNTER — Encounter (HOSPITAL_COMMUNITY)
Admission: RE | Disposition: A | Discharge status: Home / Self Care | Payer: Self-pay | Source: Home / Self Care | Attending: Gastroenterology

## 2023-10-27 ENCOUNTER — Encounter (HOSPITAL_COMMUNITY): Payer: Self-pay | Admitting: Gastroenterology

## 2023-10-27 DIAGNOSIS — F419 Anxiety disorder, unspecified: Secondary | ICD-10-CM | POA: Insufficient documentation

## 2023-10-27 DIAGNOSIS — Z860100 Personal history of colon polyps, unspecified: Secondary | ICD-10-CM

## 2023-10-27 DIAGNOSIS — Z800 Family history of malignant neoplasm of digestive organs: Secondary | ICD-10-CM | POA: Insufficient documentation

## 2023-10-27 DIAGNOSIS — Z9889 Other specified postprocedural states: Secondary | ICD-10-CM

## 2023-10-27 DIAGNOSIS — I272 Pulmonary hypertension, unspecified: Secondary | ICD-10-CM

## 2023-10-27 DIAGNOSIS — E119 Type 2 diabetes mellitus without complications: Secondary | ICD-10-CM | POA: Diagnosis not present

## 2023-10-27 DIAGNOSIS — Z7985 Long-term (current) use of injectable non-insulin antidiabetic drugs: Secondary | ICD-10-CM | POA: Insufficient documentation

## 2023-10-27 DIAGNOSIS — D122 Benign neoplasm of ascending colon: Secondary | ICD-10-CM

## 2023-10-27 DIAGNOSIS — Z1211 Encounter for screening for malignant neoplasm of colon: Secondary | ICD-10-CM | POA: Diagnosis not present

## 2023-10-27 DIAGNOSIS — K5730 Diverticulosis of large intestine without perforation or abscess without bleeding: Secondary | ICD-10-CM

## 2023-10-27 DIAGNOSIS — K56699 Other intestinal obstruction unspecified as to partial versus complete obstruction: Secondary | ICD-10-CM | POA: Insufficient documentation

## 2023-10-27 DIAGNOSIS — R519 Headache, unspecified: Secondary | ICD-10-CM | POA: Diagnosis not present

## 2023-10-27 DIAGNOSIS — J439 Emphysema, unspecified: Secondary | ICD-10-CM | POA: Insufficient documentation

## 2023-10-27 DIAGNOSIS — Z87891 Personal history of nicotine dependence: Secondary | ICD-10-CM | POA: Insufficient documentation

## 2023-10-27 DIAGNOSIS — M1990 Unspecified osteoarthritis, unspecified site: Secondary | ICD-10-CM | POA: Diagnosis not present

## 2023-10-27 DIAGNOSIS — E0590 Thyrotoxicosis, unspecified without thyrotoxic crisis or storm: Secondary | ICD-10-CM | POA: Insufficient documentation

## 2023-10-27 DIAGNOSIS — Z860101 Personal history of adenomatous and serrated colon polyps: Secondary | ICD-10-CM | POA: Diagnosis not present

## 2023-10-27 DIAGNOSIS — Z79899 Other long term (current) drug therapy: Secondary | ICD-10-CM | POA: Insufficient documentation

## 2023-10-27 DIAGNOSIS — G4730 Sleep apnea, unspecified: Secondary | ICD-10-CM | POA: Insufficient documentation

## 2023-10-27 DIAGNOSIS — K219 Gastro-esophageal reflux disease without esophagitis: Secondary | ICD-10-CM | POA: Diagnosis not present

## 2023-10-27 DIAGNOSIS — J449 Chronic obstructive pulmonary disease, unspecified: Secondary | ICD-10-CM | POA: Diagnosis not present

## 2023-10-27 HISTORY — PX: COLONOSCOPY WITH PROPOFOL: SHX5780

## 2023-10-27 HISTORY — PX: POLYPECTOMY: SHX149

## 2023-10-27 LAB — GLUCOSE, CAPILLARY
Glucose-Capillary: 70 mg/dL (ref 70–99)
Glucose-Capillary: 64 mg/dL — ABNORMAL LOW (ref 70–99)
Glucose-Capillary: 79 mg/dL (ref 70–99)

## 2023-10-27 SURGERY — COLONOSCOPY WITH PROPOFOL
Anesthesia: Monitor Anesthesia Care

## 2023-10-27 MED ORDER — SODIUM CHLORIDE 0.9 % IV SOLN: INTRAVENOUS | Status: DC

## 2023-10-27 MED ORDER — PROPOFOL 500 MG/50ML IV EMUL
INTRAVENOUS | Status: AC
  Filled 2023-10-27: qty 50

## 2023-10-27 MED ORDER — LIDOCAINE 2% (20 MG/ML) 5 ML SYRINGE
INTRAMUSCULAR | Status: DC | PRN
  Administered 2023-10-27: 60 mg via INTRAVENOUS

## 2023-10-27 MED ORDER — PROPOFOL 500 MG/50ML IV EMUL
INTRAVENOUS | Status: DC | PRN
  Administered 2023-10-27: 100 ug/kg/min via INTRAVENOUS

## 2023-10-27 MED ORDER — PROPOFOL 10 MG/ML IV BOLUS
INTRAVENOUS | Status: DC | PRN
Start: 2023-10-27 — End: 2023-10-27
  Administered 2023-10-27: 20 mg via INTRAVENOUS
  Administered 2023-10-27: 30 mg via INTRAVENOUS

## 2023-10-27 MED ORDER — SODIUM CHLORIDE 0.9 % IV SOLN
INTRAVENOUS | Status: DC | PRN
Start: 2023-10-27 — End: 2023-10-27

## 2023-10-27 SURGICAL SUPPLY — 17 items
ELECTRODE REM PT RTRN 9FT ADLT (ELECTROSURGICAL) IMPLANT
FORCEPS BIOP RAD 4 LRG CAP 4 (CUTTING FORCEPS) IMPLANT
FORCEPS BXJMBJMB 240X2.8X (CUTTING FORCEPS) IMPLANT
INJECTOR/SNARE I SNARE (MISCELLANEOUS) IMPLANT
LUBRICANT JELLY 4.5OZ STERILE (MISCELLANEOUS) IMPLANT
MANIFOLD NEPTUNE II (INSTRUMENTS) IMPLANT
NDL SCLEROTHERAPY 25GX240 (NEEDLE) IMPLANT
NEEDLE SCLEROTHERAPY 25GX240 (NEEDLE) IMPLANT
PAD FLOOR 36X40 (MISCELLANEOUS) ×2 IMPLANT
PROBE APC STR FIRE (PROBE) IMPLANT
PROBE INJECTION GOLD 7FR (MISCELLANEOUS) IMPLANT
SNARE ROTATE MED OVAL 20MM (MISCELLANEOUS) IMPLANT
SYR 50ML LL SCALE MARK (SYRINGE) IMPLANT
TRAP SPECIMEN MUCOUS 40CC (MISCELLANEOUS) IMPLANT
TUBING ENDO SMARTCAP PENTAX (MISCELLANEOUS) IMPLANT
TUBING IRRIGATION ENDOGATOR (MISCELLANEOUS) ×2 IMPLANT
WATER STERILE IRR 1000ML POUR (IV SOLUTION) IMPLANT

## 2023-10-27 NOTE — H&P (Signed)
 [facility] History and Physical   Primary Care Physician:  Zilphia Hilt, Charyl Coppersmith, MD   Reason for Procedure:   Follow up colon polyp  Plan:    Surveillance colonoscopy     HPI: Lindsay Brady is a 73 y.o. female undergoing surveillance colonoscopy.  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  Her father was diagnosed with colon cancer after age 59.   Past Medical History:  Diagnosis Date   Allergy     Anxiety    Arthritis    Cataract    Cholecystitis 07/01/2014   COPD (chronic obstructive pulmonary disease) (HCC)    Diabetes mellitus without complication (HCC)    type 2    Emphysema of lung (HCC)    Fracture 2013 ?   left leg   GERD (gastroesophageal reflux disease)    hx of gerd   Headache    Heart murmur    Hx: UTI (urinary tract infection)    Hyperlipidemia    OA (osteoarthritis) of knee    Pneumonia    hx of viral pna years ago   Shortness of breath dyspnea    Sleep apnea    uses cpap   Urgency of urination     Past Surgical History:  Procedure Laterality Date   ABDOMINAL HYSTERECTOMY     BIOPSY  10/07/2022   Procedure: BIOPSY;  Surgeon: Elois Hair, MD;  Location: [facility] ENDOSCOPY;  Service: Gastroenterology;;   BREAST BIOPSY Left 03/09/2022   CARPAL TUNNEL RELEASE     CHOLECYSTECTOMY N/A 07/19/2014   Procedure: LAPAROSCOPIC CHOLECYSTECTOMY WITH INTRAOPERATIVE CHOLANGIOGRAM;  Surgeon: Dorena Gander, MD;  Location: [facility] OR;  Service: General;  Laterality: N/A;   COLONOSCOPY  ?   2011   COLONOSCOPY WITH PROPOFOL  N/A 10/07/2022   Procedure: COLONOSCOPY WITH PROPOFOL ;  Surgeon: Elois Hair, MD;  Location: [facility] ENDOSCOPY;  Service: Gastroenterology;  Laterality: N/A;   HEMOSTASIS CLIP PLACEMENT  10/07/2022   Procedure: HEMOSTASIS CLIP PLACEMENT;  Surgeon: Elois Hair, MD;  Location: [facility] ENDOSCOPY;  Service: Gastroenterology;;   KNEE  ARTHROSCOPY     POLYPECTOMY  10/07/2022   Procedure: POLYPECTOMY;  Surgeon: Elois Hair, MD;  Location: [facility] ENDOSCOPY;  Service: Gastroenterology;;   SUBMUCOSAL LIFTING INJECTION  10/07/2022   Procedure: SUBMUCOSAL LIFTING INJECTION;  Surgeon: Elois Hair, MD;  Location: [facility] ENDOSCOPY;  Service: Gastroenterology;;   SUBMUCOSAL TATTOO INJECTION  10/07/2022   Procedure: SUBMUCOSAL TATTOO INJECTION;  Surgeon: Elois Hair, MD;  Location: [facility] ENDOSCOPY;  Service: Gastroenterology;;   TONSILLECTOMY      Prior to Admission medications   Medication Sig Start Date End Date Taking? Authorizing Provider  acetaminophen  (TYLENOL ) 500 MG tablet Take 500 mg by mouth every 6 (six) hours as needed for moderate pain or headache.   Yes [provider]  chlorpheniramine (CHLOR-TRIMETON) 4 MG tablet Take 4 mg by mouth at bedtime as needed for allergies. 07/29/21  Yes [provider]  escitalopram  (LEXAPRO ) 10 MG tablet TAKE 1 TABLET BY MOUTH EVERY DAY 07/04/23  Yes Zilphia Hilt, Charyl Coppersmith, MD  famotidine  (PEPCID ) 20 MG tablet Take one After breakfast and supper 01/20/21  Yes Wert, Michael B, MD  Melatonin 10 MG TABS Take 10 mg by mouth at bedtime as needed (sleep).   Yes [provider]  Multiple Vitamins-Minerals (MULTIVITAL PO) Take by mouth.   Yes [provider]  MYRBETRIQ  50 MG TB24 tablet TAKE 1 TABLET BY MOUTH EVERY DAY 07/11/23  Yes Zilphia Hilt, Charyl Coppersmith, MD  oxyCODONE -acetaminophen  (PERCOCET) 7.5-325 MG tablet Take 1 tablet by mouth 3 (three) times daily as needed for moderate pain. 09/27/22  Yes [provider]  simvastatin  (ZOCOR ) 20 MG tablet TAKE 1 TABLET BY MOUTH EVERYDAY AT BEDTIME 03/16/23  Yes Zilphia Hilt, Charyl Coppersmith, MD  valsartan  (DIOVAN ) 40 MG tablet TAKE 1 TABLET BY MOUTH EVERY DAY 07/18/23  Yes Zilphia Hilt, Charyl Coppersmith, MD  Vitamin D , Ergocalciferol , (DRISDOL) 1.25 MG (50000 UNIT) CAPS capsule Take 50,000 Units by mouth  every Wednesday. 08/24/22  Yes [provider]  albuterol  (VENTOLIN  HFA) 108 (90 Base) MCG/ACT inhaler Inhale 2 puffs into the lungs every 6 (six) hours as needed for wheezing or shortness of breath. 08/13/20   Zilphia Hilt, Charyl Coppersmith, MD  ibuprofen  (ADVIL ,MOTRIN ) 800 MG tablet Take 1 tablet (800 mg total) by mouth every 8 (eight) hours as needed. 10/28/16   Reuben Castilla, NP  OXYGEN  2lpm with sleep and 3lpm with exertion [facility]    [provider]  OZEMPIC, 2 MG/DOSE, 8 MG/3ML SOPN Inject 2 mg into the skin once a week. 10/04/23   [provider]    Current Facility-Administered Medications  Medication Dose Route Frequency Provider Last Rate Last Admin   0.9 %  sodium chloride  infusion   Intravenous Continuous Elois Hair, MD       Facility-Administered Medications Ordered in Other Encounters  Medication Dose Route Frequency Provider Last Rate Last Admin   0.9 %  sodium chloride  infusion   Intravenous Continuous PRN Rochell Chroman, CRNA   New Bag at 10/27/23 0800    Allergies as of 06/30/2023 - Review Complete 04/26/2023  Allergen Reaction Noted   Macrodantin [nitrofurantoin]  07/06/2022   Bactrim [sulfamethoxazole-trimethoprim] Rash 07/18/2014   Erythromycin Other (See Comments) 07/18/2014   Fosamax  [alendronate sodium] Cough 09/13/2016    Family History  Problem Relation Age of Onset   Stroke Mother    Colon polyps Father    Lung cancer Father        smoked   Colon cancer Father    Stomach cancer Paternal Grandmother    Cancer Paternal Grandmother    Stomach cancer Paternal Grandfather    Cancer Paternal Grandfather    Rectal cancer Neg Hx    Esophageal cancer Neg Hx     Social History   Socioeconomic History   Marital status: Married    Spouse name: Not on file   Number of children: Not on file   Years of education: Not on file   Highest education level: Not on file  Occupational History   Not on file  Tobacco Use   Smoking  status: Former    Current packs/day: 0.00    Average packs/day: 2.0 packs/day for 30.0 years (60.0 ttl pk-yrs)    Types: Cigarettes    Start date: 05/31/1966    Quit date: 05/31/1996    Years since quitting: 27.4   Smokeless tobacco: Never  Vaping Use   Vaping status: Never Used  Substance and Sexual Activity   Alcohol use: No    Alcohol/week: 0.0 standard drinks of alcohol    Comment: rare   Drug use: No   Sexual activity: Not on file  Other Topics Concern   Not on file  Social History Narrative   Not on file   Social Drivers of Health   Financial Resource Strain:  Low Risk  (04/26/2023)   Overall Financial Resource Strain (CARDIA)    Difficulty of Paying Living Expenses: Not hard at all  Food Insecurity: No Food Insecurity (04/26/2023)   Hunger Vital Sign    Worried About Running Out of Food in the Last Year: Never true    Ran Out of Food in the Last Year: Never true  Transportation Needs: No Transportation Needs (04/26/2023)   PRAPARE - Administrator, Civil Service (Medical): No    Lack of Transportation (Non-Medical): No  Physical Activity: Inactive (04/26/2023)   Exercise Vital Sign    Days of Exercise per Week: 0 days    Minutes of Exercise per Session: 0 min  Stress: Stress Concern Present (04/26/2023)   [employer] of Occupational Health - Occupational Stress Questionnaire    Feeling of Stress : To some extent  Social Connections: Moderately Isolated (04/26/2023)   Social Connection and Isolation Panel [NHANES]    Frequency of Communication with Friends and Family: More than three times a week    Frequency of Social Gatherings with Friends and Family: More than three times a week    Attends Religious Services: Never    Database administrator or Organizations: No    Attends Banker Meetings: Never    Marital Status: Married  Catering manager Violence: Not At Risk (04/26/2023)   Humiliation, Afraid, Rape, and Kick questionnaire     Fear of Current or Ex-Partner: No    Emotionally Abused: No    Physically Abused: No    Sexually Abused: No    Review of Systems:  All other review of systems negative except as mentioned in the HPI.  Physical Exam: Vital signs BP 133/71   Pulse 100   Temp 99.1 F (37.3 C) (Temporal)   Resp 19   Ht 5\' 5"  (1.651 m)   Wt 83.9 kg   SpO2 95%   BMI 30.79 kg/m   General:   Alert,  Well-developed, well-nourished, pleasant and cooperative in NAD Airway:  Mallampati 2 Lungs:  Clear throughout to auscultation.   Heart:  Regular rate and rhythm; no murmurs, clicks, rubs,  or gallops. Abdomen:  Soft, nontender and nondistended. Normal bowel sounds.   Neuro/Psych:  Normal mood and affect. A and O x 3   Anthonie Lotito E. Cherryl Corona, MD [facility]

## 2023-10-27 NOTE — Transfer of Care (Signed)
 Immediate Anesthesia Transfer of Care Note  Patient: Lindsay Brady  Procedure(s) Performed: COLONOSCOPY WITH PROPOFOL  POLYPECTOMY, INTESTINE  Patient Location: Endoscopy Unit  Anesthesia Type:MAC  Level of Consciousness: awake  Airway & Oxygen  Therapy: Patient Spontanous Breathing and Patient connected to nasal cannula oxygen   Post-op Assessment: Report given to RN and Post -op Vital signs reviewed and stable  Post vital signs: Reviewed and stable  Last Vitals:  Vitals Value Taken Time  BP    Temp    Pulse    Resp    SpO2      Last Pain:  Vitals:   10/27/23 0754  TempSrc: Temporal  PainSc: 0-No pain         Complications: No notable events documented.

## 2023-10-27 NOTE — Discharge Instructions (Signed)

## 2023-10-27 NOTE — Anesthesia Preprocedure Evaluation (Addendum)
 Anesthesia Evaluation  Patient identified by MRN, date of birth, ID band Patient awake    Reviewed: Allergy  & Precautions, NPO status , Patient's Chart, lab work & pertinent test results  Airway Mallampati: II  TM Distance: >3 FB Neck ROM: Full    Dental  (+) Teeth Intact, Dental Advisory Given   Pulmonary sleep apnea and Continuous Positive Airway Pressure Ventilation , COPD,  COPD inhaler, former smoker   breath sounds clear to auscultation       Cardiovascular + dysrhythmias + Valvular Problems/Murmurs  Rhythm:Regular Rate:Normal     Neuro/Psych  Headaches PSYCHIATRIC DISORDERS Anxiety        GI/Hepatic Neg liver ROS,GERD  ,,  Endo/Other  diabetes Hyperthyroidism   Renal/GU Renal disease     Musculoskeletal  (+) Arthritis ,    Abdominal   Peds  Hematology negative hematology ROS (+)   Anesthesia Other Findings   Reproductive/Obstetrics                             Anesthesia Physical Anesthesia Plan  ASA: 3  Anesthesia Plan: MAC   Post-op Pain Management: Minimal or no pain anticipated   Induction: Intravenous  PONV Risk Score and Plan: 0 and Propofol  infusion  Airway Management Planned: Natural Airway and Nasal Cannula  Additional Equipment: None  Intra-op Plan:   Post-operative Plan:   Informed Consent: I have reviewed the patients History and Physical, chart, labs and discussed the procedure including the risks, benefits and alternatives for the proposed anesthesia with the patient or authorized representative who has indicated his/her understanding and acceptance.       Plan Discussed with: CRNA  Anesthesia Plan Comments:        Anesthesia Quick Evaluation

## 2023-10-27 NOTE — Anesthesia Procedure Notes (Signed)
 Procedure Name: MAC Date/Time: 10/27/2023 8:31 AM  Performed by: Rochell Chroman, CRNAPre-anesthesia Checklist: Patient identified, Emergency Drugs available, Suction available and Patient being monitored Patient Re-evaluated:Patient Re-evaluated prior to induction Oxygen  Delivery Method: Nasal cannula

## 2023-10-27 NOTE — Anesthesia Postprocedure Evaluation (Signed)
 Anesthesia Post Note  Patient: Lindsay Brady  Procedure(s) Performed: COLONOSCOPY WITH PROPOFOL  POLYPECTOMY, INTESTINE     Patient location during evaluation: PACU Anesthesia Type: MAC Level of consciousness: awake and alert Pain management: pain level controlled Vital Signs Assessment: post-procedure vital signs reviewed and stable Respiratory status: spontaneous breathing, nonlabored ventilation, respiratory function stable and patient connected to nasal cannula oxygen  Cardiovascular status: stable and blood pressure returned to baseline Postop Assessment: no apparent nausea or vomiting Anesthetic complications: no  No notable events documented.  Last Vitals:  Vitals:   10/27/23 0940 10/27/23 0948  BP: 116/60 118/69  Pulse: 76 78  Resp: 17 19  Temp:    SpO2: 100% 100%    Last Pain:  Vitals:   10/27/23 0948  TempSrc:   PainSc: 0-No pain                 Willian Harrow

## 2023-10-27 NOTE — Op Note (Signed)
 [facility] Patient Name: Lindsay Brady Procedure Date: 10/27/2023 MRN: [id number] Attending MD: Jaye Mettle. Cherryl Corona , MD, [id number] Date of Birth: 08/29/1950 CSN: [id number] Age: 73 Admit Type: Outpatient Procedure:                Colonoscopy Indications:              High risk colon cancer surveillance: Personal                            history of sessile serrated colon polyp (10 mm or                            greater in size), , Last colonoscopy: May 2024, 20                            mm SSP removed from hepatic flexure Providers:                Geralyn Knee E. Cherryl Corona, MD, Ila Malay, RN, Tyrus Gallus, Technician Referring MD:             Jaye Mettle. Cherryl Corona, MD Medicines:                Monitored Anesthesia Care Complications:            No immediate complications. Estimated Blood Loss:     Estimated blood loss was minimal. Procedure:                Pre-Anesthesia Assessment:                           - Prior to the procedure, a History and Physical                            was performed, and patient medications and                            allergies were reviewed. The patient's tolerance of                            previous anesthesia was also reviewed. The risks                            and benefits of the procedure and the sedation                            options and risks were discussed with the patient.                            All questions were answered, and informed consent                            was obtained. Prior Anticoagulants: The patient has  taken no anticoagulant or antiplatelet agents. ASA                            Grade Assessment: III - A patient with severe                            systemic disease. After reviewing the risks and                            benefits, the patient was deemed in satisfactory                            condition to undergo the procedure.                            After obtaining informed consent, the colonoscope                            was passed under direct vision. Throughout the                            procedure, the patient's blood pressure, pulse, and                            oxygen  saturations were monitored continuously. The                            [id number]-HQ190L ([id number]) Olympus colonoscope was                            introduced through the anus and advanced to the the                            cecum, identified by appendiceal orifice and                            ileocecal valve. The colonoscopy was performed                            without difficulty. The patient tolerated the                            procedure well. The quality of the bowel                            preparation was fair except the cecum was                            unsatisfactory. The ileocecal valve, appendiceal                            orifice, and rectum were photographed. The bowel  preparation used was SUPREP via split dose                            instruction. Scope In: 8:33:34 AM Scope Out: 8:56:00 AM Scope Withdrawal Time: 0 hours 14 minutes 6 seconds  Total Procedure Duration: 0 hours 22 minutes 26 seconds  Findings:      The perianal and digital rectal examinations were normal. Pertinent       negatives include normal sphincter tone and no palpable rectal lesions.      A 4 mm polyp was found in the ascending colon. The polyp was sessile.       The polyp was removed with a cold snare. Resection and retrieval were       complete. Estimated blood loss was minimal.      A medium post polypectomy scar was found at the hepatic flexure. There       was no evidence of the previous polyp or polypoid tissue. A tattoo was       seen on the wall opposite the scar.      Many large-mouthed and small-mouthed diverticula were found in the       sigmoid colon. There was narrowing of the colon in  association with the       diverticular opening.      The exam was otherwise normal throughout the examined colon.      The retroflexed view of the distal rectum and anal verge was normal and       showed no anal or rectal abnormalities. Impression:               - One 4 mm polyp in the ascending colon, removed                            with a cold snare. Resected and retrieved.                           - Clean appearing post-polypectomy scar and tattoo                            at the hepatic flexure.                           - Severe diverticulosis in the sigmoid colon. There                            was narrowing of the colon in association with the                            diverticular opening.                           - The distal rectum and anal verge are normal on                            retroflexion view. Moderate Sedation:      Not Applicable - Patient had care per Anesthesia. Recommendation:           - Patient has a contact number available for  emergencies. The signs and symptoms of potential                            delayed complications were discussed with the                            patient. Return to normal activities tomorrow.                            Written discharge instructions were provided to the                            patient.                           - Resume previous diet.                           - Continue present medications.                           - Await pathology results.                           - Repeat colonoscopy in 3 years for surveillance. Procedure Code(s):        --- Professional ---                           [phone], Colonoscopy, flexible; with removal of                            tumor(s), polyp(s), or other lesion(s) by snare                            technique Diagnosis Code(s):        --- Professional ---                           Z86.010, Personal history of colonic polyps                            D12.2, Benign neoplasm of ascending colon                           Z98.890, Other specified postprocedural states                           K57.30, Diverticulosis of large intestine without                            perforation or abscess without bleeding CPT copyright 2022 American Medical Association. All rights reserved. The codes documented in this report are preliminary and upon coder review may  be revised to meet current compliance requirements. Jariah Tarkowski E. Cherryl Corona, MD 10/27/2023 9:04:27 AM This report has been signed electronically. Number of Addenda: 0

## 2023-10-28 ENCOUNTER — Encounter (HOSPITAL_COMMUNITY): Payer: Self-pay | Admitting: Gastroenterology

## 2023-10-28 LAB — SURGICAL PATHOLOGY

## 2023-10-31 ENCOUNTER — Ambulatory Visit: Payer: Self-pay | Admitting: Gastroenterology

## 2023-10-31 NOTE — Progress Notes (Signed)
 Lindsay Brady,  The polyp that I removed during your recent procedure was completely benign but was proven to be a "pre-cancerous" polyp that MAY have grown into cancers if it had not been removed.  Based your history of a large polyp in 2024, I recommend that you have a repeat colonoscopy in 3 years.  As discussed, if you do not require home oxygen , your procedure should be able to be completed at our endoscopy facility instead of the hospital.  If you develop any new rectal bleeding, abdominal pain or significant bowel habit changes, please contact me before then.

## 2023-11-02 DIAGNOSIS — G8929 Other chronic pain: Secondary | ICD-10-CM | POA: Diagnosis not present

## 2023-11-02 DIAGNOSIS — M25512 Pain in left shoulder: Secondary | ICD-10-CM | POA: Diagnosis not present

## 2023-11-02 DIAGNOSIS — Z79899 Other long term (current) drug therapy: Secondary | ICD-10-CM | POA: Diagnosis not present

## 2023-11-02 DIAGNOSIS — E119 Type 2 diabetes mellitus without complications: Secondary | ICD-10-CM | POA: Diagnosis not present

## 2023-11-02 DIAGNOSIS — I10 Essential (primary) hypertension: Secondary | ICD-10-CM | POA: Diagnosis not present

## 2023-11-02 DIAGNOSIS — M549 Dorsalgia, unspecified: Secondary | ICD-10-CM | POA: Diagnosis not present

## 2023-11-02 DIAGNOSIS — Z1231 Encounter for screening mammogram for malignant neoplasm of breast: Secondary | ICD-10-CM | POA: Diagnosis not present

## 2023-11-04 DIAGNOSIS — Z79899 Other long term (current) drug therapy: Secondary | ICD-10-CM | POA: Diagnosis not present

## 2023-11-20 ENCOUNTER — Other Ambulatory Visit: Payer: Self-pay | Admitting: Internal Medicine

## 2023-11-20 DIAGNOSIS — N959 Unspecified menopausal and perimenopausal disorder: Secondary | ICD-10-CM

## 2023-11-22 ENCOUNTER — Other Ambulatory Visit: Payer: Self-pay | Admitting: Internal Medicine

## 2023-11-22 DIAGNOSIS — M25512 Pain in left shoulder: Secondary | ICD-10-CM | POA: Diagnosis not present

## 2023-11-22 DIAGNOSIS — M549 Dorsalgia, unspecified: Secondary | ICD-10-CM | POA: Diagnosis not present

## 2023-11-22 DIAGNOSIS — E1169 Type 2 diabetes mellitus with other specified complication: Secondary | ICD-10-CM

## 2023-12-01 DIAGNOSIS — M549 Dorsalgia, unspecified: Secondary | ICD-10-CM | POA: Diagnosis not present

## 2023-12-01 DIAGNOSIS — M25512 Pain in left shoulder: Secondary | ICD-10-CM | POA: Diagnosis not present

## 2023-12-01 DIAGNOSIS — G8929 Other chronic pain: Secondary | ICD-10-CM | POA: Diagnosis not present

## 2023-12-01 DIAGNOSIS — Z780 Asymptomatic menopausal state: Secondary | ICD-10-CM | POA: Diagnosis not present

## 2023-12-01 DIAGNOSIS — I10 Essential (primary) hypertension: Secondary | ICD-10-CM | POA: Diagnosis not present

## 2023-12-01 DIAGNOSIS — Z79899 Other long term (current) drug therapy: Secondary | ICD-10-CM | POA: Diagnosis not present

## 2023-12-01 DIAGNOSIS — Z1231 Encounter for screening mammogram for malignant neoplasm of breast: Secondary | ICD-10-CM | POA: Diagnosis not present

## 2023-12-01 DIAGNOSIS — E119 Type 2 diabetes mellitus without complications: Secondary | ICD-10-CM | POA: Diagnosis not present

## 2023-12-07 DIAGNOSIS — Z79899 Other long term (current) drug therapy: Secondary | ICD-10-CM | POA: Diagnosis not present

## 2023-12-29 DIAGNOSIS — M549 Dorsalgia, unspecified: Secondary | ICD-10-CM | POA: Diagnosis not present

## 2023-12-29 DIAGNOSIS — G8929 Other chronic pain: Secondary | ICD-10-CM | POA: Diagnosis not present

## 2023-12-29 DIAGNOSIS — Z780 Asymptomatic menopausal state: Secondary | ICD-10-CM | POA: Diagnosis not present

## 2023-12-29 DIAGNOSIS — M25512 Pain in left shoulder: Secondary | ICD-10-CM | POA: Diagnosis not present

## 2023-12-29 DIAGNOSIS — E119 Type 2 diabetes mellitus without complications: Secondary | ICD-10-CM | POA: Diagnosis not present

## 2023-12-29 DIAGNOSIS — Z1231 Encounter for screening mammogram for malignant neoplasm of breast: Secondary | ICD-10-CM | POA: Diagnosis not present

## 2023-12-29 DIAGNOSIS — I10 Essential (primary) hypertension: Secondary | ICD-10-CM | POA: Diagnosis not present

## 2023-12-29 DIAGNOSIS — Z79899 Other long term (current) drug therapy: Secondary | ICD-10-CM | POA: Diagnosis not present

## 2024-01-02 DIAGNOSIS — Z79899 Other long term (current) drug therapy: Secondary | ICD-10-CM | POA: Diagnosis not present

## 2024-01-22 ENCOUNTER — Other Ambulatory Visit: Payer: Self-pay | Admitting: Internal Medicine

## 2024-01-27 DIAGNOSIS — Z1231 Encounter for screening mammogram for malignant neoplasm of breast: Secondary | ICD-10-CM | POA: Diagnosis not present

## 2024-01-27 DIAGNOSIS — M549 Dorsalgia, unspecified: Secondary | ICD-10-CM | POA: Diagnosis not present

## 2024-01-27 DIAGNOSIS — M25512 Pain in left shoulder: Secondary | ICD-10-CM | POA: Diagnosis not present

## 2024-01-27 DIAGNOSIS — G8929 Other chronic pain: Secondary | ICD-10-CM | POA: Diagnosis not present

## 2024-01-27 DIAGNOSIS — I10 Essential (primary) hypertension: Secondary | ICD-10-CM | POA: Diagnosis not present

## 2024-01-27 DIAGNOSIS — E119 Type 2 diabetes mellitus without complications: Secondary | ICD-10-CM | POA: Diagnosis not present

## 2024-01-27 DIAGNOSIS — Z780 Asymptomatic menopausal state: Secondary | ICD-10-CM | POA: Diagnosis not present

## 2024-01-27 DIAGNOSIS — Z79899 Other long term (current) drug therapy: Secondary | ICD-10-CM | POA: Diagnosis not present

## 2024-02-25 ENCOUNTER — Other Ambulatory Visit: Payer: Self-pay | Admitting: Internal Medicine

## 2024-02-25 DIAGNOSIS — F419 Anxiety disorder, unspecified: Secondary | ICD-10-CM

## 2024-02-27 DIAGNOSIS — M549 Dorsalgia, unspecified: Secondary | ICD-10-CM | POA: Diagnosis not present

## 2024-02-27 DIAGNOSIS — I10 Essential (primary) hypertension: Secondary | ICD-10-CM | POA: Diagnosis not present

## 2024-02-27 DIAGNOSIS — G8929 Other chronic pain: Secondary | ICD-10-CM | POA: Diagnosis not present

## 2024-02-27 DIAGNOSIS — Z1231 Encounter for screening mammogram for malignant neoplasm of breast: Secondary | ICD-10-CM | POA: Diagnosis not present

## 2024-02-27 DIAGNOSIS — Z780 Asymptomatic menopausal state: Secondary | ICD-10-CM | POA: Diagnosis not present

## 2024-02-27 DIAGNOSIS — Z79899 Other long term (current) drug therapy: Secondary | ICD-10-CM | POA: Diagnosis not present

## 2024-02-27 DIAGNOSIS — M25512 Pain in left shoulder: Secondary | ICD-10-CM | POA: Diagnosis not present

## 2024-02-27 DIAGNOSIS — E119 Type 2 diabetes mellitus without complications: Secondary | ICD-10-CM | POA: Diagnosis not present

## 2024-03-14 NOTE — Progress Notes (Addendum)
 Lindsay Brady                                          MRN: [id number]   03/14/2024   The [facility] Quality Team Specialist reviewed this patient medical record for the purposes of chart review for care gap closure. The following were reviewed: chart review for care gap closure-kidney health evaluation for diabetes:eGFR  and uACR.  05/08/2024- chart reviewed again for KED and GSD labs.    [facility] Quality Team

## 2024-03-19 DIAGNOSIS — H52203 Unspecified astigmatism, bilateral: Secondary | ICD-10-CM | POA: Diagnosis not present

## 2024-03-19 DIAGNOSIS — E119 Type 2 diabetes mellitus without complications: Secondary | ICD-10-CM | POA: Diagnosis not present

## 2024-03-19 DIAGNOSIS — H353131 Nonexudative age-related macular degeneration, bilateral, early dry stage: Secondary | ICD-10-CM | POA: Diagnosis not present

## 2024-03-19 DIAGNOSIS — H2513 Age-related nuclear cataract, bilateral: Secondary | ICD-10-CM | POA: Diagnosis not present

## 2024-03-19 LAB — OPHTHALMOLOGY REPORT-SCANNED

## 2024-03-27 DIAGNOSIS — Z5320 Procedure and treatment not carried out because of patient's decision for unspecified reasons: Secondary | ICD-10-CM | POA: Diagnosis not present

## 2024-03-27 DIAGNOSIS — G8929 Other chronic pain: Secondary | ICD-10-CM | POA: Diagnosis not present

## 2024-03-27 DIAGNOSIS — Z1231 Encounter for screening mammogram for malignant neoplasm of breast: Secondary | ICD-10-CM | POA: Diagnosis not present

## 2024-03-27 DIAGNOSIS — Z780 Asymptomatic menopausal state: Secondary | ICD-10-CM | POA: Diagnosis not present

## 2024-03-27 DIAGNOSIS — M25512 Pain in left shoulder: Secondary | ICD-10-CM | POA: Diagnosis not present

## 2024-03-27 DIAGNOSIS — Z79899 Other long term (current) drug therapy: Secondary | ICD-10-CM | POA: Diagnosis not present

## 2024-03-27 DIAGNOSIS — M549 Dorsalgia, unspecified: Secondary | ICD-10-CM | POA: Diagnosis not present

## 2024-03-27 DIAGNOSIS — E119 Type 2 diabetes mellitus without complications: Secondary | ICD-10-CM | POA: Diagnosis not present

## 2024-03-27 DIAGNOSIS — I10 Essential (primary) hypertension: Secondary | ICD-10-CM | POA: Diagnosis not present

## 2024-04-05 ENCOUNTER — Ambulatory Visit: Admitting: Family Medicine

## 2024-04-05 DIAGNOSIS — Z0000 Encounter for general adult medical examination without abnormal findings: Secondary | ICD-10-CM | POA: Diagnosis not present

## 2024-04-05 NOTE — Patient Instructions (Signed)
 I really enjoyed getting to talk with you today! I am available on Tuesdays and Thursdays for virtual visits if you have any questions or concerns, or if I can be of any further assistance.   CHECKLIST FROM ANNUAL WELLNESS VISIT:  -Follow up (please call to schedule if not scheduled after visit):   -schedule in office follow up with Dr. Theophilus in the next 1-2 months   -yearly for annual wellness visit with primary care office  Here is a list of your preventive care/health maintenance measures and the plan for each if any are due:  PLAN For any measures below that may be due:    1. Can get labs at in office visit.    2. Please let us  know if you wish to do the bone density test   3. Please being a record of any vaccines done at the pharmacy  Health Maintenance  Topic Date Due   Diabetic kidney evaluation - Urine ACR  Never done   Zoster Vaccines- Shingrix (2 of 2) 09/07/2022   Pneumococcal Vaccine: 50+ Years (3 of 3 - PCV20 or PCV21) 06/23/2023   HEMOGLOBIN A1C  10/24/2023   Influenza Vaccine  12/30/2023   COVID-19 Vaccine (5 - 2025-26 season) 01/30/2024   Diabetic kidney evaluation - eGFR measurement  04/25/2024   FOOT EXAM  04/25/2024   Mammogram  03/17/2025   OPHTHALMOLOGY EXAM  03/19/2025   Medicare Annual Wellness (AWV)  04/05/2025   DTaP/Tdap/Td (4 - Td or Tdap) 07/13/2032   Colonoscopy  10/26/2033   DEXA SCAN  Completed   Hepatitis C Screening  Completed   Meningococcal B Vaccine  Aged Out    -See a dentist at least yearly  -Get your eyes checked and then per your eye specialist's recommendations  -Other issues addressed today:   -I have included below further information regarding a healthy whole foods based diet, physical activity guidelines for adults, stress management and opportunities for social connections. I hope you find this information useful.    -----------------------------------------------------------------------------------------------------------------------------------------------------------------------------------------------------------------------------------------------------------    NUTRITION: -eat real food: lots of colorful vegetables (half the plate) and fruits -5-7 servings of vegetables and fruits per day (fresh or steamed is best), exp. 2 servings of vegetables with lunch and dinner and 2 servings of fruit per day. Berries and greens such as kale and collards are great choices.  -consume on a regular basis:  fresh fruits, fresh veggies, fish, nuts, seeds, healthy oils (such as olive oil, avocado oil), whole grains (make sure for bread/pasta/crackers/etc., that the first ingredient on label contains the word whole), legumes. -can eat small amounts of dairy and lean meat (no larger than the palm of your hand), but avoid processed meats such as ham, bacon, lunch meat, etc. -drink water -try to avoid fast food and pre-packaged foods, processed meat, ultra processed foods/beverages (donuts, candy, etc.) -most experts advise limiting sodium to < 2300mg  per day, should limit further is any chronic conditions such as high blood pressure, heart disease, diabetes, etc. The American Heart Association advised that < 1500mg  is is ideal -try to avoid foods/beverages that contain any ingredients with names you do not recognize  -try to avoid foods/beverages  with added sugar or sweeteners/sweets  -try to avoid sweet drinks (including diet drinks): soda, juice, Gatorade, sweet tea, power drinks, diet drinks -try to avoid white rice, white bread, pasta (unless whole grain)  EXERCISE GUIDELINES FOR ADULTS: -if you wish to increase your physical activity, do so gradually and with the approval  of your doctor -STOP and seek medical care immediately if you have any chest pain, chest discomfort or trouble breathing when starting or  increasing exercise  -move and stretch your body, legs, feet and arms when sitting for long periods -Physical activity guidelines for optimal health in adults: -get at least 150 minutes per week of moderate exercise (can talk, but not sing); this is about 20-30 minutes of sustained activity 5-7 days per week or two 10-15 minute episodes of sustained activity 5-7 days per week -do some muscle building/resistance training/strength training at least 2 days per week  -balance exercises 3+ days per week:   Stand somewhere where you have something sturdy to hold onto if you lose balance    1) lift up on toes, then back down, start with 5x per day and work up to 20x   2) stand and lift one leg straight out to the side so that foot is a few inches of the floor, start with 5x each side and work up to 20x each side   3) stand on one foot, start with 5 seconds each side and work up to 20 seconds on each side  If you need ideas or help with getting more active:  -Silver sneakers https://tools.silversneakers.com  -Walk with a Doc: Http://www.duncan-williams.com/  -try to include resistance (weight lifting/strength building) and balance exercises twice per week: or the following link for ideas: http://castillo-powell.com/  buyducts.dk  STRESS MANAGEMENT: -can try meditating, or just sitting quietly with deep breathing while intentionally relaxing all parts of your body for 5 minutes daily -if you need further help with stress, anxiety or depression please follow up with your primary doctor or contact the wonderful folks at [facility]: [phone]  SOCIAL CONNECTIONS: -options in [place] if you wish to engage in more social and exercise related activities:  -Silver sneakers https://tools.silversneakers.com  -Walk with a Doc: Http://www.duncan-williams.com/  -Check out the [facility] Active Adults 50+  section on the [place] [employer] companies (hiking clubs, book clubs, cards and games, chess, exercise classes, aquatic classes and much more) - see the website for details: https://www.[place].gov/departments/parks-recreation/active-adults50  -YouTube has lots of exercise videos for different ages and abilities as well  -[facility] (a variety of indoor and outdoor inperson activities for adults). 813-285-2669. [address]  -Virtual Online Classes (a variety of topics): see seniorplanet.org or call [phone]  -consider volunteering at a school, hospice center, church, senior center or elsewhere

## 2024-04-05 NOTE — Progress Notes (Signed)
 Chl Mychart Amwv Questionnaire  Question 04/01/2024  2:44 PM EST - Filed by Patient  Please indicate how much help you need (if any) to perform everyday activities? Independent  Are you having any difficulty walking, taking medications on your own, and or difficulty managing daily home needs? Independent  What level of assistance (if any) do you need to walk from room to room? Independent  How much assistance do you need to manage your medications? Independent  How much assistance (if any) do you need in managing your home? Needs assistance (comment)  Are you deaf or have significant trouble hearing? Yes  Do you have difficulty seeing, even when wearing glasses or contact lenses? No  Do you have difficulty concentrating, remembering, or making decisions? No  Do you have difficulty walking or climbing stairs? Yes  Do you have difficulty dressing or bathing? No  Do you have difficulty doing errands alone such as visiting a doctor's office or shopping? No  Do you currently have any difficulty preparing food and eating? No  Do you currently have any difficulty using the toilet? No  In the past six months, have you accidentally leaked urine? Yes  Do you have any problems with loss of bowel control? Yes  Do you have any difficulty managing your medications? No  Do you have any difficulty managing your finances? No  Do you have any difficulties with housekeeping of managing your housekeeping? Yes  Have you had a fall in the past 12 months? Yes  How many falls have you had in the past year? 2 or more  Were you injured when you fell? No  Do you have any stairs in or around your home? Yes  If you have stairs, are there any without handrails? No  Is your home free of loose throw rugs in walkways, pet beds, electrical cords etc.? Yes  Do you have adequate lighting in your home to reduce risk of falls? Yes  Do you use Life Alert in case of a fall? No  Do you use a cane, walker or wheelchair? No  Do  you have grab bars installed in your bathroom(s)? No  Do you have a shower chair or bench in your shower? Yes  Do you have an elevated toilet seat or a handicapped toilet? No   Chl Mychart Sdoh  Question 04/01/2024  2:50 PM EST - Filed by Patient  On average, how many days per week do you engage in moderate to strenuous exercise (like a brisk walk)? 0 days  Do you feel stress - tense, restless, nervous, or anxious, or unable to sleep at night because your mind is troubled all the time - these days? To some extent  Do you belong to any clubs or organizations such as church groups, unions, fraternal or athletic groups, or school groups? No  In a typical week, how many times do you talk on the phone with family, friends, or neighbors? Twice a week  How often do you get together with friends or relatives? Patient declined  How often do you attend church or religious services? Never  Are you married, widowed, divorced, separated, never married, or living with a partner? Married  How hard is it for you to pay for the very basics like food, housing, medical care, and heating? Hard  Within the past 12 months, you worried that your food would run out before you got the money to buy more. Sometimes true  Within the past 12 months, the food  you bought just didn't last and you didn't have money to get more. Never true  What is the highest level of school you have completed or the highest degree you have received? Some college, no degree  In the past 12 months, has lack of transportation kept you from medical appointments or from getting medications? No  In the past 12 months, has lack of transportation kept you from meetings, work, or from getting things needed for daily living? No  Alcohol Screening Tool   How often do you have a drink containing alcohol? Monthly or less  How many drinks containing alcohol do you have on a typical day when you are drinking? 1 or 2  How often do you have six or more drinks  on one occasion? Never  In the last 12 months, was there a time when you were not able to pay the mortgage or rent on time? No  In the past 12 months, how many times have you moved where you were living? 0  At any time in the past 12 months, were you homeless or living in a shelter (including now)? No  Alcohol Screening Score (range: 0 - 12) 1     ----------------------------------------------------------------------------------------------------------------------------------------------------------------------------------------------------------------------  Because this visit was a virtual/telehealth visit, some criteria may be missing or patient reported. Any vitals not documented were not able to be obtained and vitals that have been documented are patient reported.    MEDICARE ANNUAL PREVENTIVE VISIT WITH PROVIDER: (Welcome to Medicare, initial annual wellness or annual wellness exam)  Virtual Visit via Video Note  I connected with Lindsay Brady on 04/05/24 by a video enabled telemedicine application and verified that I am speaking with the correct person using two identifiers.  Location patient: home Location provider:work or home office Persons participating in the virtual visit: patient, provider  Concerns and/or follow up today: detailed intake and risks/health assessment completed in flowsheets and below - please see for details. She has some chronic stress/ financial stress at times and urinary incontinence plans to see Dr. Theophilus for this.    Last dentist visit? Seeing dentist on a regular basis now Last eye Exam and location? Dr. Robinson, just saw in the last few weeks   See HM section in Epic for other details of completed HM.    ROS: negative for report of fevers, unintentional weight loss, vision changes, vision loss, hearing loss or change, chest pain, (sob is improved from baseline), hemoptysis, melena, hematochezia, hematuria, falls, bleeding or bruising,  thoughts of suicide or self harm, memory loss  Patient-completed extensive health risk assessment - reviewed and discussed with the patient: See Health Risk Assessment completed with patient prior to the visit either above or in recent phone note. This was reviewed in detailed with the patient today and appropriate recommendations, orders and referrals were placed as needed per Summary below and patient instructions.   Review of Medical History: -PMH, PSH, Family History and current specialty and care providers reviewed and updated and listed below   Patient Care Team: Theophilus Andrews, Tully GRADE, MD as PCP - Brady (Internal Medicine) Robinson Mayo, OD as Referring Physician (Optometry)   Past Medical History:  Diagnosis Date   Allergy     Anxiety    Arthritis    Cataract    Cholecystitis 07/01/2014   COPD (chronic obstructive pulmonary disease) (HCC)    Diabetes mellitus without complication (HCC)    type 2    Emphysema of lung (HCC)    Fracture 2013 ?  left leg   GERD (gastroesophageal reflux disease)    hx of gerd   Headache    Heart murmur    Hx: UTI (urinary tract infection)    Hyperlipidemia    OA (osteoarthritis) of knee    Pneumonia    hx of viral pna years ago   Shortness of breath dyspnea    Sleep apnea    uses cpap   Urgency of urination     Past Surgical History:  Procedure Laterality Date   ABDOMINAL HYSTERECTOMY     BIOPSY  10/07/2022   Procedure: BIOPSY;  Surgeon: Stacia Glendia BRAVO, MD;  Location: [facility] ENDOSCOPY;  Service: Gastroenterology;;   BREAST BIOPSY Left 03/09/2022   CARPAL TUNNEL RELEASE     CHOLECYSTECTOMY N/A 07/19/2014   Procedure: LAPAROSCOPIC CHOLECYSTECTOMY WITH INTRAOPERATIVE CHOLANGIOGRAM;  Surgeon: Dann Hummer, MD;  Location: [facility] OR;  Service: Brady;  Laterality: N/A;   COLONOSCOPY  ?   2011   COLONOSCOPY WITH PROPOFOL  N/A 10/07/2022   Procedure: COLONOSCOPY WITH PROPOFOL ;  Surgeon: Stacia Glendia BRAVO, MD;  Location: [facility]  ENDOSCOPY;  Service: Gastroenterology;  Laterality: N/A;   COLONOSCOPY WITH PROPOFOL  N/A 10/27/2023   Procedure: COLONOSCOPY WITH PROPOFOL ;  Surgeon: Stacia Glendia BRAVO, MD;  Location: [facility] ENDOSCOPY;  Service: Gastroenterology;  Laterality: N/A;   HEMOSTASIS CLIP PLACEMENT  10/07/2022   Procedure: HEMOSTASIS CLIP PLACEMENT;  Surgeon: Stacia Glendia BRAVO, MD;  Location: [facility] ENDOSCOPY;  Service: Gastroenterology;;   KNEE ARTHROSCOPY     POLYPECTOMY  10/07/2022   Procedure: POLYPECTOMY;  Surgeon: Stacia Glendia BRAVO, MD;  Location: [facility] ENDOSCOPY;  Service: Gastroenterology;;   POLYPECTOMY  10/27/2023   Procedure: POLYPECTOMY, INTESTINE;  Surgeon: Stacia Glendia BRAVO, MD;  Location: [facility] ENDOSCOPY;  Service: Gastroenterology;;   SUBMUCOSAL LIFTING INJECTION  10/07/2022   Procedure: SUBMUCOSAL LIFTING INJECTION;  Surgeon: Stacia Glendia BRAVO, MD;  Location: [facility] ENDOSCOPY;  Service: Gastroenterology;;   SUBMUCOSAL TATTOO INJECTION  10/07/2022   Procedure: SUBMUCOSAL TATTOO INJECTION;  Surgeon: Stacia Glendia BRAVO, MD;  Location: [facility] ENDOSCOPY;  Service: Gastroenterology;;   TONSILLECTOMY      Social History   Socioeconomic History   Marital status: Married    Spouse name: Not on file   Number of children: Not on file   Years of education: Not on file   Highest education level: Some college, no degree  Occupational History   Not on file  Tobacco Use   Smoking status: Former    Current packs/day: 0.00    Average packs/day: 2.0 packs/day for 30.0 years (60.0 ttl pk-yrs)    Types: Cigarettes    Start date: 05/31/1966    Quit date: 05/31/1996    Years since quitting: 27.8   Smokeless tobacco: Never  Vaping Use   Vaping status: Never Used  Substance and Sexual Activity   Alcohol use: No    Alcohol/week: 0.0 standard drinks of alcohol    Comment: rare   Drug use: No   Sexual activity: Not on file  Other Topics Concern   Not on file  Social History Narrative   Not on file   Social Drivers of  Health   Financial Resource Strain: High Risk (04/01/2024)   Overall Financial Resource Strain (CARDIA)    Difficulty of Paying Living Expenses: Hard  Food Insecurity: Food Insecurity Present (04/01/2024)   Hunger Vital Sign    Worried About Running Out of Food in the Last Year: Sometimes true    Ran Out of Food in the Last Year: Never  true  Transportation Needs: No Transportation Needs (04/01/2024)   PRAPARE - Administrator, Civil Service (Medical): No    Lack of Transportation (Non-Medical): No  Physical Activity: Inactive (04/01/2024)   Exercise Vital Sign    Days of Exercise per Week: 0 days    Minutes of Exercise per Session: Not on file  Stress: Stress Concern Present (04/01/2024)   [employer] of Occupational Health - Occupational Stress Questionnaire    Feeling of Stress: To some extent  Social Connections: Unknown (04/01/2024)   Social Connection and Isolation Panel    Frequency of Communication with Friends and Family: Twice a week    Frequency of Social Gatherings with Friends and Family: Patient declined    Attends Religious Services: Never    Database Administrator or Organizations: No    Attends Engineer, Structural: Not on file    Marital Status: Married  Catering Manager Violence: Not At Risk (04/26/2023)   Humiliation, Afraid, Rape, and Kick questionnaire    Fear of Current or Ex-Partner: No    Emotionally Abused: No    Physically Abused: No    Sexually Abused: No    Family History  Problem Relation Age of Onset   Stroke Mother    Colon polyps Father    Lung cancer Father        smoked   Colon cancer Father    Stomach cancer Paternal Grandmother    Cancer Paternal Grandmother    Stomach cancer Paternal Grandfather    Cancer Paternal Grandfather    Rectal cancer Neg Hx    Esophageal cancer Neg Hx     Current Outpatient Medications on File Prior to Visit  Medication Sig Dispense Refill   acetaminophen  (TYLENOL ) 500 MG tablet  Take 500 mg by mouth every 6 (six) hours as needed for moderate pain or headache.     albuterol  (VENTOLIN  HFA) 108 (90 Base) MCG/ACT inhaler Inhale 2 puffs into the lungs every 6 (six) hours as needed for wheezing or shortness of breath. 1 each 11   chlorpheniramine (CHLOR-TRIMETON) 4 MG tablet Take 4 mg by mouth at bedtime as needed for allergies.     escitalopram  (LEXAPRO ) 10 MG tablet TAKE 1 TABLET BY MOUTH EVERY DAY 90 tablet 0   famotidine  (PEPCID ) 20 MG tablet Take one After breakfast and supper     ibuprofen  (ADVIL ,MOTRIN ) 800 MG tablet Take 1 tablet (800 mg total) by mouth every 8 (eight) hours as needed. 90 tablet 0   Melatonin 10 MG TABS Take 10 mg by mouth at bedtime as needed (sleep).     mirabegron  ER (MYRBETRIQ ) 50 MG TB24 tablet TAKE 1 TABLET BY MOUTH EVERY DAY 90 tablet 1   Multiple Vitamins-Minerals (MULTIVITAL PO) Take by mouth.     oxyCODONE -acetaminophen  (PERCOCET) 7.5-325 MG tablet Take 1 tablet by mouth 3 (three) times daily as needed for moderate pain.     OXYGEN  2lpm with sleep and 3lpm with exertion [facility]     OZEMPIC, 2 MG/DOSE, 8 MG/3ML SOPN Inject 2 mg into the skin once a week.     simvastatin  (ZOCOR ) 20 MG tablet TAKE 1 TABLET BY MOUTH EVERY DAY 90 tablet 1   valsartan  (DIOVAN ) 40 MG tablet TAKE 1 TABLET BY MOUTH EVERY DAY 90 tablet 0   Vitamin D , Ergocalciferol , (DRISDOL) 1.25 MG (50000 UNIT) CAPS capsule Take 50,000 Units by mouth every Wednesday.     No current facility-administered medications on file prior to visit.  Allergies  Allergen Reactions   Macrodantin [Nitrofurantoin]     Scars lung   Bactrim [Sulfamethoxazole-Trimethoprim] Rash   Erythromycin Other (See Comments)    Pain in stomach    Fosamax  [Alendronate Sodium] Cough    After taking 1 pill, pt states that she had a cough.        Physical Exam Vitals requested from patient and listed below if patient had equipment and was able to obtain at home for this virtual visit: There were no  vitals filed for this visit. Estimated body mass index is 30.79 kg/m as calculated from the following:   Height as of 10/27/23: 5' 5 (1.651 m).   Weight as of 10/27/23: 185 lb (83.9 kg).  EKG (optional): deferred due to virtual visit  Brady: alert, oriented, no acute distress detected, full vision exam deferred due to pandemic and/or virtual encounter   HEENT: atraumatic, conjunttiva clear, no obvious abnormalities on inspection of external nose and ears  NECK: normal movements of the head and neck  LUNGS: on inspection no signs of respiratory distress, breathing rate appears normal, no obvious gross SOB, gasping or wheezing  CV: no obvious cyanosis  MS: moves all visible extremities without noticeable abnormality  PSYCH/NEURO: pleasant and cooperative, no obvious depression or anxiety, speech and thought processing grossly intact, Cognitive function grossly intact  Flowsheet Row Office Visit from 08/24/2021 in [facility]  PHQ-9 Total Score 4        04/05/2024    5:01 PM 04/26/2023    3:03 PM 08/24/2021    1:55 PM 08/13/2020    5:22 PM 06/28/2018   10:27 AM  Depression screen PHQ 2/9  Decreased Interest 0 0 0 2 0  Down, Depressed, Hopeless 0 0 0 0 0  PHQ - 2 Score 0 0 0 2 0  Altered sleeping   2 2 3   Tired, decreased energy   1 1 2   Change in appetite   0 0 0  Feeling bad or failure about yourself    0 0 0  Trouble concentrating   0 0 0  Moving slowly or fidgety/restless   1 0 0  Suicidal thoughts   0 0 0  PHQ-9 Score   4  5  5    Difficult doing work/chores   Very difficult  Not difficult at all     Data saved with a previous flowsheet row definition       04/25/2023   11:32 PM 04/26/2023    3:09 PM 04/01/2024    2:44 PM 04/05/2024    4:31 PM 04/05/2024    5:00 PM  Fall Risk  Falls in the past year? 1 1 1     Was there an injury with Fall? 0 0 0 0 0  Fall Risk Category Calculator 2  1 2      Patient at Risk for Falls Due to     History of fall(s)   Fall risk Follow up  Falls evaluation completed  Falls evaluation completed;Education provided      Patient-reported     SUMMARY AND PLAN:  Medicare annual wellness visit, subsequent   Discussed applicable health maintenance/preventive health measures and advised and referred or ordered per patient preferences: -she plans to get labs at in office visit -she already had covid and flu shots, discussed other vaccines and plans to get records if done or consider -had bone density rest a few years ago and plans to discuss with Dr. Theophilus  Health Maintenance  Topic Date Due   Diabetic kidney evaluation - Urine ACR  Never done   Zoster Vaccines- Shingrix (2 of 2) 09/07/2022   Pneumococcal Vaccine: 50+ Years (3 of 3 - PCV20 or PCV21) 06/23/2023   HEMOGLOBIN A1C  10/24/2023   Influenza Vaccine  12/30/2023   COVID-19 Vaccine (5 - 2025-26 season) 01/30/2024   Diabetic kidney evaluation - eGFR measurement  04/25/2024   FOOT EXAM  04/25/2024   Mammogram  03/17/2025   OPHTHALMOLOGY EXAM  03/19/2025   Medicare Annual Wellness (AWV)  04/05/2025   DTaP/Tdap/Td (4 - Td or Tdap) 07/13/2032   Colonoscopy  10/26/2033   DEXA SCAN  Completed   Hepatitis C Screening  Completed   Meningococcal B Vaccine  Aged Out      Education and counseling on the following was provided based on the above review of health and a plan/checklist for the patient, along with additional information discussed, was provided for the patient in the patient instructions :  -requested advanced directives -Provided counseling and plan for difficulty hearing  -Provided counseling and plan for increased risk of falling if applicable per above screening. Reviewed and demonstrated safe balance exercises that can be done at home to improve balance and discussed exercise guidelines for adults with include balance exercises at least 3 days per week.  -Advised and counseled on a healthy lifestyle - including  the importance of a healthy diet, regular physical activity, social connections and stress management. -she declined referral for stress issues, but she plans to see DR. Theophilus as wishes to consider changes to medication for anxiety -Reviewed patient's current diet. Advised and counseled on a whole foods based healthy diet. A summary of a healthy diet was provided in the Patient Instructions.  -reviewed patient's current physical activity level and discussed exercise guidelines for adults. Discussed community resources and ideas for safe exercise at home to assist in meeting exercise guideline recommendations in a safe and healthy way.  -Advise yearly dental visits at minimum and regular eye exams -counseled on opioids and poly pharmacy, she reports using on as needed and as little as possible - managed by pain specialist.  Follow up: see patient instructions     Patient Instructions  I really enjoyed getting to talk with you today! I am available on Tuesdays and Thursdays for virtual visits if you have any questions or concerns, or if I can be of any further assistance.   CHECKLIST FROM ANNUAL WELLNESS VISIT:  -Follow up (please call to schedule if not scheduled after visit):   -schedule in office follow up with Dr. Theophilus in the next 1-2 months   -yearly for annual wellness visit with primary care office  Here is a list of your preventive care/health maintenance measures and the plan for each if any are due:  PLAN For any measures below that may be due:    1. Can get labs at in office visit.    2. Please let us  know if you wish to do the bone density test   3. Please being a record of any vaccines done at the pharmacy  Health Maintenance  Topic Date Due   Diabetic kidney evaluation - Urine ACR  Never done   Zoster Vaccines- Shingrix (2 of 2) 09/07/2022   Pneumococcal Vaccine: 50+ Years (3 of 3 - PCV20 or PCV21) 06/23/2023   HEMOGLOBIN A1C  10/24/2023   Influenza Vaccine   12/30/2023   COVID-19 Vaccine (5 - 2025-26 season) 01/30/2024  Diabetic kidney evaluation - eGFR measurement  04/25/2024   FOOT EXAM  04/25/2024   Mammogram  03/17/2025   OPHTHALMOLOGY EXAM  03/19/2025   Medicare Annual Wellness (AWV)  04/05/2025   DTaP/Tdap/Td (4 - Td or Tdap) 07/13/2032   Colonoscopy  10/26/2033   DEXA SCAN  Completed   Hepatitis C Screening  Completed   Meningococcal B Vaccine  Aged Out    -See a dentist at least yearly  -Get your eyes checked and then per your eye specialist's recommendations  -Other issues addressed today:   -I have included below further information regarding a healthy whole foods based diet, physical activity guidelines for adults, stress management and opportunities for social connections. I hope you find this information useful.   -----------------------------------------------------------------------------------------------------------------------------------------------------------------------------------------------------------------------------------------------------------    NUTRITION: -eat real food: lots of colorful vegetables (half the plate) and fruits -5-7 servings of vegetables and fruits per day (fresh or steamed is best), exp. 2 servings of vegetables with lunch and dinner and 2 servings of fruit per day. Berries and greens such as kale and collards are great choices.  -consume on a regular basis:  fresh fruits, fresh veggies, fish, nuts, seeds, healthy oils (such as olive oil, avocado oil), whole grains (make sure for bread/pasta/crackers/etc., that the first ingredient on label contains the word whole), legumes. -can eat small amounts of dairy and lean meat (no larger than the palm of your hand), but avoid processed meats such as ham, bacon, lunch meat, etc. -drink water -try to avoid fast food and pre-packaged foods, processed meat, ultra processed foods/beverages (donuts, candy, etc.) -most experts advise limiting  sodium to < 2300mg  per day, should limit further is any chronic conditions such as high blood pressure, heart disease, diabetes, etc. The American Heart Association advised that < 1500mg  is is ideal -try to avoid foods/beverages that contain any ingredients with names you do not recognize  -try to avoid foods/beverages  with added sugar or sweeteners/sweets  -try to avoid sweet drinks (including diet drinks): soda, juice, Gatorade, sweet tea, power drinks, diet drinks -try to avoid white rice, white bread, pasta (unless whole grain)  EXERCISE GUIDELINES FOR ADULTS: -if you wish to increase your physical activity, do so gradually and with the approval of your doctor -STOP and seek medical care immediately if you have any chest pain, chest discomfort or trouble breathing when starting or increasing exercise  -move and stretch your body, legs, feet and arms when sitting for long periods -Physical activity guidelines for optimal health in adults: -get at least 150 minutes per week of moderate exercise (can talk, but not sing); this is about 20-30 minutes of sustained activity 5-7 days per week or two 10-15 minute episodes of sustained activity 5-7 days per week -do some muscle building/resistance training/strength training at least 2 days per week  -balance exercises 3+ days per week:   Stand somewhere where you have something sturdy to hold onto if you lose balance    1) lift up on toes, then back down, start with 5x per day and work up to 20x   2) stand and lift one leg straight out to the side so that foot is a few inches of the floor, start with 5x each side and work up to 20x each side   3) stand on one foot, start with 5 seconds each side and work up to 20 seconds on each side  If you need ideas or help with getting more active:  -Silver sneakers https://tools.silversneakers.com  -  Walk with a Doc: Http://www.duncan-williams.com/  -try to include resistance (weight lifting/strength building) and  balance exercises twice per week: or the following link for ideas: http://castillo-powell.com/  buyducts.dk  STRESS MANAGEMENT: -can try meditating, or just sitting quietly with deep breathing while intentionally relaxing all parts of your body for 5 minutes daily -if you need further help with stress, anxiety or depression please follow up with your primary doctor or contact the wonderful folks at [facility]: [phone]  SOCIAL CONNECTIONS: -options in Black if you wish to engage in more social and exercise related activities:  -Silver sneakers https://tools.silversneakers.com  -Walk with a Doc: Http://www.duncan-williams.com/  -Check out the [facility] Active Adults 50+ section on the [place] [employer] companies (hiking clubs, book clubs, cards and games, chess, exercise classes, aquatic classes and much more) - see the website for details: https://www.[place].gov/departments/parks-recreation/active-adults50  -YouTube has lots of exercise videos for different ages and abilities as well  -[facility] (a variety of indoor and outdoor inperson activities for adults). [phone]. [address]  -Virtual Online Classes (a variety of topics): see seniorplanet.org or call ([phone])308-3569  -consider volunteering at a school, hospice center, church, senior center or elsewhere            Lindsay JONELLE Cramp, DO

## 2024-04-06 ENCOUNTER — Telehealth: Payer: Self-pay | Admitting: Internal Medicine

## 2024-04-06 NOTE — Telephone Encounter (Addendum)
 Pt saw Dr luke and per message pt need to sch 1-2 month fup and cpe with Dr Theophilus

## 2024-04-09 NOTE — Telephone Encounter (Signed)
 Per dr luke message sch pt for below

## 2024-04-12 NOTE — Telephone Encounter (Signed)
 Lmom asking pt callback to schedule appt with dr theophilus for the below appts

## 2024-04-22 ENCOUNTER — Other Ambulatory Visit: Payer: Self-pay | Admitting: Internal Medicine

## 2024-04-30 ENCOUNTER — Ambulatory Visit: Admitting: Internal Medicine

## 2024-04-30 ENCOUNTER — Encounter: Payer: Self-pay | Admitting: Internal Medicine

## 2024-04-30 VITALS — BP 110/80 | HR 75 | Temp 97.8°F | Wt 167.5 lb

## 2024-04-30 DIAGNOSIS — Z780 Asymptomatic menopausal state: Secondary | ICD-10-CM

## 2024-04-30 DIAGNOSIS — E1142 Type 2 diabetes mellitus with diabetic polyneuropathy: Secondary | ICD-10-CM | POA: Diagnosis not present

## 2024-04-30 DIAGNOSIS — J020 Streptococcal pharyngitis: Secondary | ICD-10-CM | POA: Diagnosis not present

## 2024-04-30 LAB — POCT GLYCOSYLATED HEMOGLOBIN (HGB A1C): Hemoglobin A1C: 5.9 % — AB (ref 4.0–5.6)

## 2024-04-30 LAB — POCT RAPID STREP A (OFFICE): Rapid Strep A Screen: POSITIVE — AB

## 2024-04-30 MED ORDER — AMOXICILLIN-POT CLAVULANATE 875-125 MG PO TABS: 1.0000 | ORAL_TABLET | Freq: Two times a day (BID) | ORAL | 0 refills | Status: AC

## 2024-04-30 NOTE — Progress Notes (Signed)
 Established Patient Office Visit     CC/Reason for Visit: Sore throat  HPI: Lindsay Brady is a 73 y.o. female who is coming in today for the above mentioned reasons.  She was exposed to a sick child about a week ago.  3 days ago she started experiencing postnasal drip, congestion, malaise and myalgias.  No fever.  Today she woke up with a severely sore throat and decided to come in for evaluation.  Is due for an A1c check and is requesting DEXA scan.   Past Medical/Surgical History: Past Medical History:  Diagnosis Date   Allergy     Anxiety    Arthritis    Cataract    Cholecystitis 07/01/2014   COPD (chronic obstructive pulmonary disease) (HCC)    Diabetes mellitus without complication (HCC)    type 2    Emphysema of lung (HCC)    Fracture 2013 ?   left leg   GERD (gastroesophageal reflux disease)    hx of gerd   Headache    Heart murmur    Hx: UTI (urinary tract infection)    Hyperlipidemia    OA (osteoarthritis) of knee    Pneumonia    hx of viral pna years ago   Shortness of breath dyspnea    Sleep apnea    uses cpap   Urgency of urination     Past Surgical History:  Procedure Laterality Date   ABDOMINAL HYSTERECTOMY     BIOPSY  10/07/2022   Procedure: BIOPSY;  Surgeon: Stacia Glendia BRAVO, MD;  Location: [facility] ENDOSCOPY;  Service: Gastroenterology;;   BREAST BIOPSY Left 03/09/2022   CARPAL TUNNEL RELEASE     CHOLECYSTECTOMY N/A 07/19/2014   Procedure: LAPAROSCOPIC CHOLECYSTECTOMY WITH INTRAOPERATIVE CHOLANGIOGRAM;  Surgeon: Dann Hummer, MD;  Location: [facility] OR;  Service: General;  Laterality: N/A;   COLONOSCOPY  ?   2011   COLONOSCOPY WITH PROPOFOL  N/A 10/07/2022   Procedure: COLONOSCOPY WITH PROPOFOL ;  Surgeon: Stacia Glendia BRAVO, MD;  Location: [facility] ENDOSCOPY;  Service: Gastroenterology;  Laterality: N/A;   COLONOSCOPY WITH PROPOFOL  N/A 10/27/2023   Procedure: COLONOSCOPY WITH PROPOFOL ;  Surgeon: Stacia Glendia BRAVO, MD;  Location: [facility] ENDOSCOPY;   Service: Gastroenterology;  Laterality: N/A;   HEMOSTASIS CLIP PLACEMENT  10/07/2022   Procedure: HEMOSTASIS CLIP PLACEMENT;  Surgeon: Stacia Glendia BRAVO, MD;  Location: [facility] ENDOSCOPY;  Service: Gastroenterology;;   KNEE ARTHROSCOPY     POLYPECTOMY  10/07/2022   Procedure: POLYPECTOMY;  Surgeon: Stacia Glendia BRAVO, MD;  Location: [facility] ENDOSCOPY;  Service: Gastroenterology;;   POLYPECTOMY  10/27/2023   Procedure: POLYPECTOMY, INTESTINE;  Surgeon: Stacia Glendia BRAVO, MD;  Location: [facility] ENDOSCOPY;  Service: Gastroenterology;;   SUBMUCOSAL LIFTING INJECTION  10/07/2022   Procedure: SUBMUCOSAL LIFTING INJECTION;  Surgeon: Stacia Glendia BRAVO, MD;  Location: [facility] ENDOSCOPY;  Service: Gastroenterology;;   SUBMUCOSAL TATTOO INJECTION  10/07/2022   Procedure: SUBMUCOSAL TATTOO INJECTION;  Surgeon: Stacia Glendia BRAVO, MD;  Location: [facility] ENDOSCOPY;  Service: Gastroenterology;;   TONSILLECTOMY      Social History:  reports that she quit smoking about 27 years ago. Her smoking use included cigarettes. She started smoking about 57 years ago. She has a 60 pack-year smoking history. She has never used smokeless tobacco. She reports that she does not drink alcohol and does not use drugs.  Allergies: Allergies  Allergen Reactions   Macrodantin [Nitrofurantoin]     Scars lung   Bactrim [Sulfamethoxazole-Trimethoprim] Rash   Erythromycin Other (See Comments)    Pain  in stomach    Fosamax  [Alendronate Sodium] Cough    After taking 1 pill, pt states that she had a cough.     Family History:  Family History  Problem Relation Age of Onset   Stroke Mother    Colon polyps Father    Lung cancer Father        smoked   Colon cancer Father    Stomach cancer Paternal Grandmother    Cancer Paternal Grandmother    Stomach cancer Paternal Grandfather    Cancer Paternal Grandfather    Rectal cancer Neg Hx    Esophageal cancer Neg Hx      Current Outpatient Medications:    acetaminophen  (TYLENOL ) 500 MG  tablet, Take 500 mg by mouth every 6 (six) hours as needed for moderate pain or headache., Disp: , Rfl:    albuterol  (VENTOLIN  HFA) 108 (90 Base) MCG/ACT inhaler, Inhale 2 puffs into the lungs every 6 (six) hours as needed for wheezing or shortness of breath., Disp: 1 each, Rfl: 11   amoxicillin-clavulanate (AUGMENTIN) 875-125 MG tablet, Take 1 tablet by mouth 2 (two) times daily for 7 days., Disp: 14 tablet, Rfl: 0   chlorpheniramine (CHLOR-TRIMETON) 4 MG tablet, Take 4 mg by mouth at bedtime as needed for allergies., Disp: , Rfl:    escitalopram  (LEXAPRO ) 10 MG tablet, TAKE 1 TABLET BY MOUTH EVERY DAY, Disp: 90 tablet, Rfl: 0   famotidine  (PEPCID ) 20 MG tablet, Take one After breakfast and supper, Disp: , Rfl:    ibuprofen  (ADVIL ,MOTRIN ) 800 MG tablet, Take 1 tablet (800 mg total) by mouth every 8 (eight) hours as needed., Disp: 90 tablet, Rfl: 0   Melatonin 10 MG TABS, Take 10 mg by mouth at bedtime as needed (sleep)., Disp: , Rfl:    Multiple Vitamins-Minerals (MULTIVITAL PO), Take by mouth., Disp: , Rfl:    oxyCODONE -acetaminophen  (PERCOCET) 7.5-325 MG tablet, Take 1 tablet by mouth 3 (three) times daily as needed for moderate pain., Disp: , Rfl:    simvastatin  (ZOCOR ) 20 MG tablet, TAKE 1 TABLET BY MOUTH EVERY DAY, Disp: 90 tablet, Rfl: 1   valsartan  (DIOVAN ) 40 MG tablet, TAKE 1 TABLET BY MOUTH EVERY DAY, Disp: 90 tablet, Rfl: 0   Vitamin D , Ergocalciferol , (DRISDOL) 1.25 MG (50000 UNIT) CAPS capsule, Take 50,000 Units by mouth every Wednesday., Disp: , Rfl:    mirabegron  ER (MYRBETRIQ ) 50 MG TB24 tablet, TAKE 1 TABLET BY MOUTH EVERY DAY, Disp: 90 tablet, Rfl: 1   OXYGEN , 2lpm with sleep and 3lpm with exertion AHC, Disp: , Rfl:    OZEMPIC, 2 MG/DOSE, 8 MG/3ML SOPN, Inject 2 mg into the skin once a week., Disp: , Rfl:   Review of Systems:  Negative unless indicated in HPI.   Physical Exam: Vitals:   04/30/24 1422  BP: 110/80  Pulse: 75  Temp: 97.8 F (36.6 C)  TempSrc: Oral  SpO2:  98%  Weight: 167 lb 8 oz (76 kg)    Body mass index is 27.87 kg/m.   Physical Exam Vitals reviewed.  Constitutional:      Appearance: Normal appearance.  HENT:     Right Ear: External ear normal.     Left Ear: External ear normal.     Mouth/Throat:     Mouth: Mucous membranes are moist.     Pharynx: Oropharynx is clear. Posterior oropharyngeal erythema present.  Eyes:     Conjunctiva/sclera: Conjunctivae normal.  Cardiovascular:     Rate and Rhythm: Normal rate and  regular rhythm.  Pulmonary:     Effort: Pulmonary effort is normal.     Breath sounds: Normal breath sounds.  Neurological:     Mental Status: She is alert.      Impression and Plan:  Strep pharyngitis -     POCT rapid strep A -     Amoxicillin-Pot Clavulanate; Take 1 tablet by mouth 2 (two) times daily for 7 days.  Dispense: 14 tablet; Refill: 0  Type 2 diabetes mellitus with diabetic polyneuropathy, without long-term current use of insulin (HCC) -     POCT glycosylated hemoglobin (Hb A1C)  Postmenopausal estrogen deficiency -     DG Bone Density; Future   - Rapid strep test is positive.  Treat with Augmentin for 7 days. - A1c demonstrates excellent diabetic control at 5.9. - DEXA ordered.  Time spent:32 minutes reviewing chart, interviewing and examining patient and formulating plan of care.     Tully Theophilus Andrews, MD [facility]

## 2024-05-11 ENCOUNTER — Ambulatory Visit: Payer: Self-pay

## 2024-05-11 NOTE — Telephone Encounter (Signed)
 FYI Only or Action Required?: Action required by provider: clinical question for provider and update on patient condition.  Patient was last seen in primary care on 04/30/2024 by Theophilus Andrews, Tully GRADE, MD.  Called Nurse Triage reporting Vaginitis.  Symptoms began several days ago.  Interventions attempted: Nothing.  Symptoms are: gradually worsening.  Triage Disposition: See Physician Within 24 Hours  Patient/caregiver understands and will follow disposition?: No, wishes to speak with PCP                  Message from Brittany M sent at 05/11/2024  1:08 PM EST  Reason for Triage: Patient was seen on 12/1 for Strep throat and she now has a UTI- symptoms started 3 days ago. Wanting a medication to be called in to the pharmacy if she does not have to come into the office   Reason for Disposition  [1] MILD to MODERATE vaginal pain AND [2] present > 24 hours  (Exception: Chronic pain.)  Answer Assessment - Initial Assessment Questions Patient would like message sent to PCP and treatment called in. RN advised that due to symptoms she would need to be seen for possible vaginal swab or urine testing. No appointments at PCP or regional offices. Patient declines going to urgent care due her copay is too expensive. She states she had a home UTI testing kit but it is expired so she couldn't use it.   1. SYMPTOM: What's the main symptom you're concerned about? (e.g., pain, itching, dryness)     Vaginal irritation especially when wiping or sitting down. Feels swollen/irritated  2. LOCATION: Where is the  irritation located? (e.g., inside/outside, left/right)     Left and right.  3. ONSET: When did the  symptoms  start?     3 days ago. Started at the end of her antibiotic course.  4. PAIN: Is there any pain? If Yes, ask: How bad is it? (Scale: 1-10; mild, moderate, severe)     Yes, irritation 2-3/10.  5. ITCHING: Is there any itching? If Yes, ask: How bad is  it? (Scale: 1-10; mild, moderate, severe)     No.  6. CAUSE: What do you think is causing the discharge? Have you had the same problem before? What happened then?     Patient states she thinks this is a UTI.  7. OTHER SYMPTOMS: Do you have any other symptoms? (e.g., fever, itching, vaginal bleeding, pain with urination, injury to genital area, vaginal foreign body)     Patient was recently treated on antibiotics for strep, states symptoms are improved/resolved. No sore throat, fever, vaginal bleeding, abdominal pain, back or flank pain, itching, vaginal discharge, urinary frequency, foul odor or cloudy urine.  Protocols used: Vaginal Symptoms-A-AH

## 2024-05-14 MED ORDER — FLUCONAZOLE 150 MG PO TABS: 150.0000 mg | ORAL_TABLET | Freq: Once | ORAL | 0 refills | Status: AC

## 2024-05-14 MED ORDER — FLUCONAZOLE 150 MG PO TABS: 150.0000 mg | ORAL_TABLET | Freq: Once | ORAL | 0 refills | Status: DC

## 2024-05-14 NOTE — Telephone Encounter (Signed)
 Rx called in and the patient is aware.

## 2024-05-24 ENCOUNTER — Other Ambulatory Visit: Payer: Self-pay | Admitting: Internal Medicine

## 2024-05-24 DIAGNOSIS — F419 Anxiety disorder, unspecified: Secondary | ICD-10-CM

## 2024-05-25 ENCOUNTER — Other Ambulatory Visit: Payer: Self-pay | Admitting: Internal Medicine

## 2024-05-25 DIAGNOSIS — E1169 Type 2 diabetes mellitus with other specified complication: Secondary | ICD-10-CM

## 2024-08-09 ENCOUNTER — Encounter: Admitting: Internal Medicine
